# Patient Record
Sex: Male | Born: 1955
Health system: Southern US, Community
[De-identification: ages and names within clinical notes are randomized; demographics above are authoritative.]

## PROBLEM LIST (undated history)

## (undated) DIAGNOSIS — J929 Pleural plaque without asbestos: Secondary | ICD-10-CM

## (undated) DIAGNOSIS — E785 Hyperlipidemia, unspecified: Secondary | ICD-10-CM

## (undated) DIAGNOSIS — E119 Type 2 diabetes mellitus without complications: Secondary | ICD-10-CM

## (undated) DIAGNOSIS — I10 Essential (primary) hypertension: Secondary | ICD-10-CM

## (undated) DIAGNOSIS — H269 Unspecified cataract: Secondary | ICD-10-CM

## (undated) HISTORY — DX: Hyperlipidemia, unspecified: E78.5

## (undated) HISTORY — DX: Type 2 diabetes mellitus without complications: E11.9

## (undated) HISTORY — DX: Unspecified cataract: H26.9

---
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
## 2015-07-21 ENCOUNTER — Other Ambulatory Visit: Payer: Self-pay | Admitting: Infectious Disease

## 2015-07-21 ENCOUNTER — Ambulatory Visit
Admission: RE | Admit: 2015-07-21 | Discharge: 2015-07-21 | Disposition: A | Discharge status: Home / Self Care | Payer: No Typology Code available for payment source | Source: Ambulatory Visit | Attending: Infectious Disease | Admitting: Infectious Disease

## 2015-07-21 DIAGNOSIS — Z111 Encounter for screening for respiratory tuberculosis: Secondary | ICD-10-CM

## 2015-07-22 ENCOUNTER — Other Ambulatory Visit: Payer: Self-pay | Admitting: Infectious Disease

## 2015-07-22 ENCOUNTER — Ambulatory Visit
Admission: RE | Admit: 2015-07-22 | Discharge: 2015-07-22 | Disposition: A | Discharge status: Home / Self Care | Payer: No Typology Code available for payment source | Source: Ambulatory Visit | Attending: Infectious Disease | Admitting: Infectious Disease

## 2015-07-22 DIAGNOSIS — R9389 Abnormal findings on diagnostic imaging of other specified body structures: Secondary | ICD-10-CM

## 2015-08-06 ENCOUNTER — Ambulatory Visit (INDEPENDENT_AMBULATORY_CARE_PROVIDER_SITE_OTHER): Payer: Self-pay | Admitting: Family Medicine

## 2015-08-06 VITALS — BP 110/68 | HR 86 | Temp 98.5°F | Resp 16 | Ht 65.0 in | Wt 220.8 lb

## 2015-08-06 DIAGNOSIS — R9389 Abnormal findings on diagnostic imaging of other specified body structures: Secondary | ICD-10-CM

## 2015-08-06 DIAGNOSIS — R938 Abnormal findings on diagnostic imaging of other specified body structures: Secondary | ICD-10-CM

## 2015-08-06 NOTE — Progress Notes (Signed)
   Subjective:    Patient ID: Lance Mason, male    DOB: 22-Sep-1956, 59 y.o.   MRN: [id number]  Chief Complaint  Patient presents with  . SPOTS ON LUNG    was diagnosed in [place] (APRIL), had a CXR before coming to the [place] to live   Medications, allergies, past medical history, surgical history, family history, social history and problem list reviewed and updated.  HPI  15 yof presents with recent abnormal cxr.   Initially had a cxr in [place] for immigration purposes prior to coming to the [place]. Per pts family member who is interpreting this cxr showed nodules. He then tested neg for tb and came to the [place]. Once in this country he had a f/u cxr 2 wks ago at [facility] on [address]. These results are in epic and are concerning for prior asbestos exposure. Radiologist note recommends non contrast ct.   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 He denies cp, sob, fevers, chills, cough, hemoptysis. Quit smoking 20 yrs ago. Smoked for 5 yrs.   Review of Systems See HPI     Objective:   Physical Exam  Constitutional: He appears well-developed and well-nourished.  Non-toxic appearance. He does not have a sickly appearance. He does not appear ill. No distress.  BP 110/68 mmHg  Pulse 86  Temp(Src) 98.5 F (36.9 C) (Oral)  Resp 16  Ht  (1.651 m)  Wt 220 lb 12.8 oz (100.154 kg)  BMI 36.74 kg/m2  SpO2 98%       Assessment & Plan:   Abnormal chest x-ray - Plan: CT Chest Wo Contrast, Ambulatory referral to Pulmonology --non contrast chest ct ordered per radiology recommendation --referral placed for pulm  --uptodate information given regarding asbestos exposure  Greater than 20 minutes spent with family and pt discussing test results and plan moving forward.   Donnajean Lopes, PA-C Physician Assistant-Certified [facility]  08/06/2015 10:38 AM

## 2015-08-06 NOTE — Patient Instructions (Signed)
We have referred you for a CT scan of the chest. We will be in contact with you when this is scheduled. I have also referred you to see a lung specialist. This will happen after the CT scan.  Please let us know if there is anything we can do in the meantime.

## 2015-08-10 NOTE — Progress Notes (Signed)
Discussed with McVeigh PA and plan agreed upon  Sandria Bales. Alwyn Ren MD

## 2015-08-18 ENCOUNTER — Ambulatory Visit (HOSPITAL_COMMUNITY): Payer: No Typology Code available for payment source | Attending: Physician Assistant

## 2015-08-27 ENCOUNTER — Institutional Professional Consult (permissible substitution): Payer: No Typology Code available for payment source | Admitting: Internal Medicine

## 2015-09-25 ENCOUNTER — Institutional Professional Consult (permissible substitution): Payer: No Typology Code available for payment source | Admitting: Internal Medicine

## 2015-09-28 ENCOUNTER — Institutional Professional Consult (permissible substitution): Payer: 59 | Admitting: Pulmonary Disease

## 2015-09-30 ENCOUNTER — Encounter: Payer: Self-pay | Admitting: Internal Medicine

## 2015-09-30 ENCOUNTER — Ambulatory Visit (INDEPENDENT_AMBULATORY_CARE_PROVIDER_SITE_OTHER): Payer: 59 | Admitting: Internal Medicine

## 2015-09-30 VITALS — BP 106/70 | HR 100 | Ht 66.0 in | Wt 224.0 lb

## 2015-09-30 DIAGNOSIS — R911 Solitary pulmonary nodule: Secondary | ICD-10-CM | POA: Insufficient documentation

## 2015-09-30 DIAGNOSIS — I10 Essential (primary) hypertension: Secondary | ICD-10-CM

## 2015-09-30 NOTE — Patient Instructions (Signed)
Please see patient coordinator before you leave today  to schedule CT scan and we will call with results

## 2015-09-30 NOTE — Progress Notes (Signed)
Subjective:    Patient ID: Lance Mason, male    DOB: 07-Sep-1956,    MRN: [id number]  HPI  59 yo Lance Mason quit smoking around 1996 told he had abn cxr in June 2016 with neg TB test in [place] (with no priors apparently) so cleared to immigrate and referred to pulmonary clinic 09/30/2015  by Dr Agustin CreeMcVeigh with cxr showing L mid lung nodule with ? asbestos   09/30/2015 1st [facility] office visit/ Anant Agard   Chief Complaint  Patient presents with  . Pulmonary Consult    Referred by Dr Raelyn Ensignodd McVeigh for eval of pulmonary nodules. [name].  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 No unusual exp hx or h/o childhood pna/ asthma or knowledge of premature birth.  Sleeping ok without nocturnal  or early am exacerbation  of respiratory  c/o's or need for noct saba. Also denies any obvious fluctuation of symptoms with weather or environmental changes or other aggravating or alleviating factors except as outlined above   Current Medications, Allergies, Complete Past Medical History, Past Surgical History, Family History, and Social History were reviewed in [facility] electronic medical record.             Review of Systems  Constitutional: Negative for fever, chills, activity change, appetite change and unexpected weight change.  HENT: Negative for congestion, dental problem, postnasal drip, rhinorrhea, sneezing, sore throat, trouble swallowing and voice change.   Eyes: Negative for visual disturbance.  Respiratory: Positive for shortness of breath. Negative for cough and choking.   Cardiovascular: Negative for  chest pain and leg swelling.  Gastrointestinal: Negative for nausea, vomiting and abdominal pain.  Genitourinary: Negative for difficulty urinating.  Musculoskeletal: Negative for arthralgias.  Skin: Negative for rash.  Psychiatric/Behavioral: Negative for behavioral problems and confusion.       Objective:   Physical Exam  amb obese latino male nad  Wt Readings from Last 3 Encounters:  09/30/15 224 lb (101.606 kg)  08/06/15 220 lb 12.8 oz (100.154 kg)    Vital signs reviewed   HEENT: nl dentition, turbinates, and orophanx. Nl external ear canals without cough reflex   NECK :  without JVD/Nodes/TM/ nl carotid upstrokes bilaterally   LUNGS: no acc muscle use, clear to A and P bilaterally without cough on insp or exp maneuvers   CV:  RRR  no s3 or murmur or increase in P2, no edema   ABD:  soft and nontender with nl excursion in the supine position. No bruits or organomegaly, bowel sounds nl  MS:  warm without deformities, calf tenderness, cyanosis or clubbing  SKIN: warm and dry without lesions    NEURO:  alert, approp, no deficits     I personally reviewed images and agree with radiology impression as follows:  CXR:  07/22/15  Persistent ill-defined nodular opacity in left midlung. Differential diagnosis includes calcified pleural plaque, bronchogenic carcinoma, and infectious or inflammatory process. Chest CT without contrast recommended for further evaluation.  Calcified pleural plaque along both hemidiaphragms, consistent with prior asbestos exposure.  Assessment & Plan:

## 2015-10-01 ENCOUNTER — Encounter: Payer: Self-pay | Admitting: Internal Medicine

## 2015-10-01 DIAGNOSIS — I10 Essential (primary) hypertension: Secondary | ICD-10-CM | POA: Insufficient documentation

## 2015-10-01 NOTE — Assessment & Plan Note (Signed)
I have reviewed his available chest x-rays and agree with radiology that the changes are nonspecific but could represent asbestosis unfortunately the nodule therefore could represent bronchogenic carcinoma. The next best step therefore is to proceed with a CT chest scan and go from there.  I had an extended discussion with the patient and fm through interpreter reviewing all relevant studies completed to date and  lasting 25 minutes (most of which was focused on  trying to understand his occupational exp hx)  of a 40 minute visit    Discussed in detail all the  indications, usual  risks and alternatives  relative to the benefits with patient who agrees to proceed with conservative w/u as outline.      Please see instructions for details which were reviewed in writing and the patient given a copy highlighting the part that I personally wrote and discussed at today's ov.

## 2015-10-01 NOTE — Assessment & Plan Note (Signed)
Complicated by dm and hbp  Body mass index is 36.17 kg/(m^2).  No results found for: TSH   Contributing also likely to  doe/reviewed the need and the process to achieve and maintain neg calorie balance > defer f/u primary care including intermittently monitoring thyroid status

## 2015-10-01 NOTE — Assessment & Plan Note (Signed)
Note on ACEi but no cough so ok to continue

## 2015-10-05 ENCOUNTER — Ambulatory Visit (INDEPENDENT_AMBULATORY_CARE_PROVIDER_SITE_OTHER)
Admission: RE | Admit: 2015-10-05 | Discharge: 2015-10-05 | Disposition: A | Discharge status: Home / Self Care | Payer: 59 | Source: Ambulatory Visit | Attending: Internal Medicine | Admitting: Internal Medicine

## 2015-10-05 DIAGNOSIS — J929 Pleural plaque without asbestos: Secondary | ICD-10-CM

## 2015-10-05 DIAGNOSIS — R911 Solitary pulmonary nodule: Secondary | ICD-10-CM | POA: Diagnosis not present

## 2015-10-05 HISTORY — DX: Pleural plaque without asbestos: J92.9

## 2015-10-05 NOTE — Progress Notes (Signed)
Quick Note:  Spoke with pt and notified of results per Dr. Wert. Pt verbalized understanding and denied any questions.  ______ 

## 2016-05-28 ENCOUNTER — Encounter (HOSPITAL_COMMUNITY): Payer: Self-pay | Admitting: Emergency Medicine

## 2016-05-28 ENCOUNTER — Ambulatory Visit (HOSPITAL_COMMUNITY)
Admission: EM | Admit: 2016-05-28 | Discharge: 2016-05-28 | Disposition: A | Discharge status: Nursing Home (Includes ICF) | Payer: BLUE CROSS/BLUE SHIELD | Attending: Emergency Medicine | Admitting: Emergency Medicine

## 2016-05-28 DIAGNOSIS — R739 Hyperglycemia, unspecified: Secondary | ICD-10-CM | POA: Diagnosis not present

## 2016-05-28 DIAGNOSIS — H05821 Myopathy of extraocular muscles, right orbit: Secondary | ICD-10-CM

## 2016-05-28 DIAGNOSIS — I10 Essential (primary) hypertension: Secondary | ICD-10-CM | POA: Insufficient documentation

## 2016-05-28 DIAGNOSIS — Z7984 Long term (current) use of oral hypoglycemic drugs: Secondary | ICD-10-CM | POA: Diagnosis not present

## 2016-05-28 DIAGNOSIS — R29810 Facial weakness: Secondary | ICD-10-CM | POA: Diagnosis not present

## 2016-05-28 DIAGNOSIS — Z87891 Personal history of nicotine dependence: Secondary | ICD-10-CM | POA: Insufficient documentation

## 2016-05-28 DIAGNOSIS — E1165 Type 2 diabetes mellitus with hyperglycemia: Secondary | ICD-10-CM | POA: Diagnosis not present

## 2016-05-28 DIAGNOSIS — H02402 Unspecified ptosis of left eyelid: Secondary | ICD-10-CM | POA: Diagnosis present

## 2016-05-28 DIAGNOSIS — G510 Bell's palsy: Secondary | ICD-10-CM | POA: Diagnosis not present

## 2016-05-28 LAB — BASIC METABOLIC PANEL
ANION GAP: 10 (ref 5–15)
BUN: 15 mg/dL (ref 6–20)
CHLORIDE: 97 mmol/L — AB (ref 101–111)
CO2: 24 mmol/L (ref 22–32)
Calcium: 9.4 mg/dL (ref 8.9–10.3)
Creatinine, Ser: 0.76 mg/dL (ref 0.61–1.24)
GFR calc non Af Amer: >60 mL/min (ref >60)
Glucose, Bld: 474 mg/dL — ABNORMAL HIGH (ref 65–99)
POTASSIUM: 3.8 mmol/L (ref 3.5–5.1)
SODIUM: 131 mmol/L — AB (ref 135–145)

## 2016-05-28 LAB — POCT I-STAT, CHEM 8
BUN: 16 mg/dL (ref 6–20)
CREATININE: 0.7 mg/dL (ref 0.61–1.24)
Calcium, Ion: 1.1 mmol/L — ABNORMAL LOW (ref 1.13–1.30)
Chloride: 97 mmol/L — ABNORMAL LOW (ref 101–111)
Glucose, Bld: 491 mg/dL — ABNORMAL HIGH (ref 65–99)
HEMATOCRIT: 46 % (ref 39.0–52.0)
Hemoglobin: 15.6 g/dL (ref 13.0–17.0)
POTASSIUM: 4.1 mmol/L (ref 3.5–5.1)
Sodium: 134 mmol/L — ABNORMAL LOW (ref 135–145)
TCO2: 27 mmol/L (ref 0–100)

## 2016-05-28 LAB — CBC
HEMATOCRIT: 43.6 % (ref 39.0–52.0)
HEMOGLOBIN: 15.3 g/dL (ref 13.0–17.0)
MCH: 31.3 pg (ref 26.0–34.0)
MCHC: 35.1 g/dL (ref 30.0–36.0)
MCV: 89.2 fL (ref 78.0–100.0)
PLATELETS: 200 10*3/uL (ref 150–400)
RBC: 4.89 MIL/uL (ref 4.22–5.81)
RDW: 12.5 % (ref 11.5–15.5)
WBC: 7.7 10*3/uL (ref 4.0–10.5)

## 2016-05-28 LAB — URINALYSIS, ROUTINE W REFLEX MICROSCOPIC
Bilirubin Urine: NEGATIVE
Hgb urine dipstick: NEGATIVE
Ketones, ur: 15 mg/dL — AB
LEUKOCYTES UA: NEGATIVE
Nitrite: NEGATIVE
PH: 5 (ref 5.0–8.0)
PROTEIN: NEGATIVE mg/dL
SPECIFIC GRAVITY, URINE: 1.041 — AB (ref 1.005–1.030)

## 2016-05-28 LAB — CBG MONITORING, ED: Glucose-Capillary: 480 mg/dL — ABNORMAL HIGH (ref 65–99)

## 2016-05-28 LAB — URINE MICROSCOPIC-ADD ON
BACTERIA UA: NONE SEEN
RBC / HPF: NONE SEEN RBC/hpf (ref 0–5)

## 2016-05-28 NOTE — ED Provider Notes (Signed)
CSN: [id number]     Arrival date & time 05/28/16  1924 History   First MD Initiated Contact with Patient 05/28/16 1933     Chief Complaint  Patient presents with  . Facial Droop   (Consider location/radiation/quality/duration/timing/severity/associated sxs/prior Treatment) HPI He is a 60-year-old man here with his daughter and granddaughter for evaluation of facial twitch. Granddaughter acts as Equities trader.  Yesterday, the family noticed that his right eye seemed to be twitching and his smile maybe pulled a little to the right.  Initially, he reported some pain in the back of his neck, but this has resolved. He denies any focal numbness, tingling, or weakness. No difficulty with speech or swallowing. No visual changes. Another granddaughter was admitted to the emergency room yesterday in poor condition, which did cause a lot of stress.  Family wanted him to be checked out given his history of diabetes.  Past Medical History  Diagnosis Date  . Diabetes mellitus without complication (HCC)    History reviewed. No pertinent past surgical history. Family History  Problem Relation Age of Onset  . Heart disease Mother   . Hypertension Mother   . Heart disease Father    Social History  Substance Use Topics  . Smoking status: Former Smoker -- 0.50 packs/day for 5 years    Types: Cigarettes    Quit date: 12/19/1994  . Smokeless tobacco: Never Used  . Alcohol Use: 0.0 oz/week    0 Standard drinks or equivalent per week     Comment: occasionally beer    Review of Systems As in history of present illness Allergies  Review of patient's allergies indicates no known allergies.  Home Medications   Prior to Admission medications   Medication Sig Start Date End Date Taking? Authorizing Provider  glipiZIDE (GLUCOTROL) 5 MG tablet Take 1 tablet by mouth daily. 09/25/15   Historical Provider, MD  lisinopril-hydrochlorothiazide (PRINZIDE,ZESTORETIC) 10-12.5 MG tablet Take 1 tablet by mouth daily.  09/25/15   Historical Provider, MD  metFORMIN (GLUCOPHAGE) 850 MG tablet Take 850 mg by mouth 2 (two) times daily with a meal.    Historical Provider, MD   Meds Ordered and Administered this Visit  Medications - No data to display  BP 122/69 mmHg  Pulse 85  Temp(Src) 97.9 F (36.6 C) (Oral)  Resp 16  SpO2 100% No data found.   Physical Exam  Constitutional: He is oriented to person, place, and time. He appears well-developed and well-nourished. No distress.  Eyes: Conjunctivae and EOM are normal. Pupils are equal, round, and reactive to light.  Neck: Neck supple.  Cardiovascular: Normal rate, regular rhythm and normal heart sounds.   No murmur heard. Pulmonary/Chest: Effort normal and breath sounds normal. No respiratory distress. He has no wheezes. He has no rales.  Neurological: He is alert and oriented to person, place, and time. No cranial nerve deficit. He exhibits normal muscle tone. Coordination normal. GCS eye subscore is 4. GCS verbal subscore is 5. GCS motor subscore is 6.  Negative Romberg. No facial asymmetry, except for slight drooping of the right eye. I am also able to open the right eye against resistance very slightly. Smile is symmetric. Symmetric eyebrow raise. Normal extraocular movements.    ED Course  Procedures (including critical care time)  Labs Review Labs Reviewed  POCT I-STAT, CHEM 8 - Abnormal; Notable for the following:    Sodium 134 (*)    Chloride 97 (*)    Glucose, Bld 491 (*)    Calcium,  Ion 1.10 (*)    All other components within normal limits    Imaging Review No results found.   MDM   1. Hyperglycemia   2. Eye muscle weakness, right    Neurologic exam is unremarkable except for slight drooping of the right eye with slight weakness of the muscles of the right eye.  I suspect all of his symptoms are due to the stress of having his granddaughter in the emergency room. However, with a glucose of 491 and no values to compare this to, I  am concerned for underlying issues. He has not seen his primary doctor for over a year. He has been taking his diabetes medication. He does not check his blood sugar at home.  We'll transfer to the emergency room for additional evaluation and management of hyperglycemia.    Charm RingsErin J Marciana Uplinger, MD 05/28/16 2020

## 2016-05-28 NOTE — ED Notes (Signed)
Pt c/o facial droop on left side onset yest affecting his eye and mouth... Pt MAE x4... Steady gait... Both daughters are at bedside... A&O x4... No acute distress.

## 2016-05-28 NOTE — ED Notes (Signed)
Pt sent from UC, pt initially c/o eye twitching. Pt had I stat chem 8 drawn and showed blood sugar nearly 500. Pt denies any sypmetomes but when questioned, states he has been feeling the need to pee a lot more and been thirsty. Pt in NAD, denies pain, AAOX4.

## 2016-05-28 NOTE — ED Notes (Signed)
Pt declined shuttle service... tx by PV w/family

## 2016-05-29 ENCOUNTER — Emergency Department (HOSPITAL_COMMUNITY)
Admission: EM | Admit: 2016-05-29 | Discharge: 2016-05-29 | Disposition: A | Discharge status: Home / Self Care | Payer: BLUE CROSS/BLUE SHIELD | Attending: Emergency Medicine | Admitting: Emergency Medicine

## 2016-05-29 ENCOUNTER — Emergency Department (HOSPITAL_COMMUNITY): Payer: BLUE CROSS/BLUE SHIELD

## 2016-05-29 DIAGNOSIS — R29810 Facial weakness: Secondary | ICD-10-CM | POA: Diagnosis not present

## 2016-05-29 DIAGNOSIS — R739 Hyperglycemia, unspecified: Secondary | ICD-10-CM

## 2016-05-29 DIAGNOSIS — E119 Type 2 diabetes mellitus without complications: Secondary | ICD-10-CM

## 2016-05-29 HISTORY — DX: Essential (primary) hypertension: I10

## 2016-05-29 LAB — RAPID URINE DRUG SCREEN, HOSP PERFORMED
Amphetamines: NOT DETECTED
BARBITURATES: NOT DETECTED
Benzodiazepines: NOT DETECTED
Cocaine: NOT DETECTED
OPIATES: NOT DETECTED
TETRAHYDROCANNABINOL: NOT DETECTED

## 2016-05-29 LAB — CBG MONITORING, ED
Glucose-Capillary: 261 mg/dL — ABNORMAL HIGH (ref 65–99)
Glucose-Capillary: 353 mg/dL — ABNORMAL HIGH (ref 65–99)

## 2016-05-29 MED ORDER — SODIUM CHLORIDE 0.9 % IV BOLUS (SEPSIS)
2000.0000 mL | Freq: Once | INTRAVENOUS | Status: AC
  Administered 2016-05-29: 2000 mL via INTRAVENOUS

## 2016-05-29 NOTE — ED Provider Notes (Signed)
CSN: [id number]     Arrival date & time 05/28/16  2038 History   First MD Initiated Contact with Patient 05/29/16 0042     Chief Complaint  Patient presents with  . Hyperglycemia     (Consider location/radiation/quality/duration/timing/severity/associated sxs/prior Treatment) The history is provided by the patient, medical records and a relative. The history is limited by a language barrier. A language interpreter was used.     Lance Mason is a 60 y.o. male  with a hx of NIDDM presents to the Emergency Department complaining of persistent, left eye drooping onset yesterday afternoon noted by family.  Pt denies symptoms.  Pt was evaluated at [facility] tonight who thought this was stress related as his granddaughter is in the ICU at this time.  Upon drawing blood work, it was discovered that he was hyperglycemic.  Pt does endorse increased urination and thirst for some time.  Denies numbness, weakness, tingling, AMS, imbalance, speech or swallowing deficits, fatigue, visual changes, headache, nausea, vomiting, syncope.  He denies recent illness.  No known injuries.  He takes metformin and has been compliant.  Pt does reports that he ran out of his HTN medications 4 days ago.  Last PCP visit was > 1 year ago.  PT does not check his glucose at home.  No known aggravating or alleviating factors.      Past Medical History  Diagnosis Date  . Diabetes mellitus without complication (HCC)   . Hypertension    History reviewed. No pertinent past surgical history. Family History  Problem Relation Age of Onset  . Heart disease Mother   . Hypertension Mother   . Heart disease Father    Social History  Substance Use Topics  . Smoking status: Former Smoker -- 0.50 packs/day for 5 years    Types: Cigarettes    Quit date: 12/19/1994  . Smokeless tobacco: Never Used  . Alcohol Use: 0.0 oz/week    0 Standard drinks or equivalent per week     Comment: occasionally beer    Review of Systems   Constitutional: Negative for fever, diaphoresis, appetite change, fatigue and unexpected weight change.  HENT: Negative for mouth sores.   Eyes: Negative for visual disturbance.  Respiratory: Negative for cough, chest tightness, shortness of breath and wheezing.   Cardiovascular: Negative for chest pain.  Gastrointestinal: Negative for nausea, vomiting, abdominal pain, diarrhea and constipation.  Endocrine: Negative for polydipsia, polyphagia and polyuria.  Genitourinary: Negative for dysuria, urgency, frequency and hematuria.  Musculoskeletal: Negative for back pain and neck stiffness.  Skin: Negative for rash.  Allergic/Immunologic: Negative for immunocompromised state.  Neurological: Negative for syncope, light-headedness and headaches.       Left eye droop per family - pt denies  Hematological: Does not bruise/bleed easily.  Psychiatric/Behavioral: Negative for sleep disturbance. The patient is not nervous/anxious.       Allergies  Review of patient's allergies indicates no known allergies.  Home Medications   Prior to Admission medications   Medication Sig Start Date End Date Taking? Authorizing Provider  glipiZIDE (GLUCOTROL) 5 MG tablet Take 1 tablet by mouth daily. 09/25/15   Historical Provider, MD  lisinopril-hydrochlorothiazide (PRINZIDE,ZESTORETIC) 10-12.5 MG tablet Take 1 tablet by mouth daily. 09/25/15   Historical Provider, MD  metFORMIN (GLUCOPHAGE) 850 MG tablet Take 850 mg by mouth 2 (two) times daily with a meal.    Historical Provider, MD   BP 132/79 mmHg  Pulse 69  Temp(Src) 98.5 F (36.9 C) (Oral)  Resp 15  Ht  (1.676 m)  Wt 92.171 kg  BMI 32.81 kg/m2  SpO2 99% Physical Exam  Constitutional: He is oriented to person, place, and time. He appears well-developed and well-nourished. No distress.  HENT:  Head: Normocephalic and atraumatic.  Mouth/Throat: Oropharynx is clear and moist.  Eyes: Conjunctivae and EOM are normal. Pupils are equal, round,  and reactive to light. No scleral icterus.  No horizontal, vertical or rotational nystagmus Very minimal drooping of the right eye - family reports his right eye is normal and the Left eye is the one in questions - no drooping of the left eye noted  Neck: Normal range of motion. Neck supple.  Full active and passive ROM without pain No midline or paraspinal tenderness No nuchal rigidity or meningeal signs  Cardiovascular: Normal rate, regular rhythm, normal heart sounds and intact distal pulses.   No murmur heard. Pulmonary/Chest: Effort normal and breath sounds normal. No respiratory distress. He has no wheezes. He has no rales.  Abdominal: Soft. Bowel sounds are normal. There is no tenderness. There is no rebound and no guarding.  Musculoskeletal: Normal range of motion.  Lymphadenopathy:    He has no cervical adenopathy.  Neurological: He is alert and oriented to person, place, and time. He has normal reflexes. No cranial nerve deficit. He exhibits normal muscle tone. Coordination normal.  Mental Status:  Alert, oriented, thought content appropriate. Speech fluent without evidence of aphasia. Able to follow 2 step commands without difficulty.  Cranial Nerves:  II:  Peripheral visual fields grossly normal, pupils equal, round, reactive to light III,IV, VI: ptosis not present, extra-ocular motions intact bilaterally  V,VII: smile symmetric, facial light touch sensation equal VIII: hearing grossly normal bilaterally  IX,X: midline uvula rise  XI: bilateral shoulder shrug equal and strong XII: midline tongue extension  Motor:  5/5 in upper and lower extremities bilaterally including strong and equal grip strength and dorsiflexion/plantar flexion Sensory: Pinprick and light touch normal in all extremities.  Deep Tendon Reflexes: 2+ and symmetric  Cerebellar: normal finger-to-nose with bilateral upper extremities Gait: normal gait and balance CV: distal pulses palpable throughout   Skin:  Skin is warm and dry. No rash noted. He is not diaphoretic.  Psychiatric: He has a normal mood and affect. His behavior is normal. Judgment and thought content normal.  Nursing note and vitals reviewed.   ED Course  Procedures (including critical care time) Labs Review Labs Reviewed  BASIC METABOLIC PANEL - Abnormal; Notable for the following:    Sodium 131 (*)    Chloride 97 (*)    Glucose, Bld 474 (*)    All other components within normal limits  URINALYSIS, ROUTINE W REFLEX MICROSCOPIC (NOT AT [facility]) - Abnormal; Notable for the following:    Specific Gravity, Urine 1.041 (*)    Glucose, UA >1000 (*)    Ketones, ur 15 (*)    All other components within normal limits  URINE MICROSCOPIC-ADD ON - Abnormal; Notable for the following:    Squamous Epithelial / LPF 0-5 (*)    All other components within normal limits  CBG MONITORING, ED - Abnormal; Notable for the following:    Glucose-Capillary 480 (*)    All other components within normal limits  CBG MONITORING, ED - Abnormal; Notable for the following:    Glucose-Capillary 353 (*)    All other components within normal limits  CBG MONITORING, ED - Abnormal; Notable for the following:    Glucose-Capillary 261 (*)    All  other components within normal limits  CBC  URINE RAPID DRUG SCREEN, HOSP PERFORMED    Imaging Review Ct Head Wo Contrast  05/29/2016  CLINICAL DATA:  Acute onset of left-sided facial droop. Initial encounter. EXAM: CT HEAD WITHOUT CONTRAST TECHNIQUE: Contiguous axial images were obtained from the base of the skull through the vertex without intravenous contrast. COMPARISON:  None. FINDINGS: There is no evidence of acute infarction, mass lesion, or intra- or extra-axial hemorrhage on CT. The posterior fossa, including the cerebellum, brainstem and fourth ventricle, is within normal limits. The third and lateral ventricles, and basal ganglia are unremarkable in appearance. The cerebral hemispheres are symmetric in  appearance, with normal gray-white differentiation. No mass effect or midline shift is seen. There is no evidence of fracture; visualized osseous structures are unremarkable in appearance. The orbits are within normal limits. The paranasal sinuses and mastoid air cells are well-aerated. No significant soft tissue abnormalities are seen. IMPRESSION: Unremarkable noncontrast CT of the head. Electronically Signed   By: Roanna RaiderJeffery  Chang M.D.   On: 05/29/2016 03:33   I have personally reviewed and evaluated these images and lab results as part of my medical decision-making.   EKG Interpretation   Date/Time:  Sunday May 29 2016 01:58:36 EDT Ventricular Rate:  73 PR Interval:  156 QRS Duration: 97 QT Interval:  405 QTC Calculation: 446 R Axis:   56 Text Interpretation:  Sinus rhythm Borderline low voltage, extremity leads  No old tracing to compare Confirmed by [facility]  MD, DAVID ([id number]) on  05/29/2016 2:13:10 AM      MDM   Final diagnoses:  Type 2 diabetes mellitus without complication, without long-term current use of insulin (HCC)  Hyperglycemia   [facility] sided eye droop onset > 24 hours prior, however this is not visible on my exam.  Patient granddaughter at bedside reports that she does not see any of this either.  Normal neurologic exam here. No evidence of CVA on CT scan. Doubt TIA as patient denied all symptoms including facial droop. Hyperglycemia has been treated with fluids alone and is under 300. He does not take insulin at home. He reports that he has been under significant stress with a family member in the ICU.  Recommend compliance with medications and diet along with close follow-up with primary care. This was discussed with patient and family who state understanding and are in agreement with the plan. Discussed with him reasons to return immediately to the emergency department including recurrence of the facial droop, slurred speech,  difficulty walking or other concerns.  The patient was discussed with and seen by Dr. Preston FleetingGlick who agrees with the treatment plan.    Dahlia ClientHannah Horacio Werth, PA-C 05/29/16 [phone]  Dione Boozeavid Glick, MD 05/29/16 [phone]

## 2016-05-29 NOTE — ED Notes (Signed)
Pt taken to CT.

## 2016-05-29 NOTE — Discharge Instructions (Signed)
1. Medications: usual home medications as directed 2. Treatment: rest, drink plenty of fluids, continue diabetic diet 3. Follow Up: Please followup with your primary doctor in 2-3 days for discussion of your diagnoses and further evaluation after today's visit; if you do not have a primary care doctor use the resource guide provided to find one; Please return to the ER for worsening or return of symptoms

## 2016-05-30 ENCOUNTER — Ambulatory Visit (INDEPENDENT_AMBULATORY_CARE_PROVIDER_SITE_OTHER): Payer: BLUE CROSS/BLUE SHIELD | Admitting: Family Medicine

## 2016-05-30 ENCOUNTER — Encounter (HOSPITAL_COMMUNITY): Payer: Self-pay | Admitting: Emergency Medicine

## 2016-05-30 VITALS — BP 114/72 | HR 101 | Temp 98.0°F | Resp 18 | Ht 66.0 in | Wt 202.0 lb

## 2016-05-30 DIAGNOSIS — E1165 Type 2 diabetes mellitus with hyperglycemia: Secondary | ICD-10-CM

## 2016-05-30 DIAGNOSIS — G510 Bell's palsy: Secondary | ICD-10-CM | POA: Insufficient documentation

## 2016-05-30 DIAGNOSIS — Z87891 Personal history of nicotine dependence: Secondary | ICD-10-CM

## 2016-05-30 DIAGNOSIS — R739 Hyperglycemia, unspecified: Secondary | ICD-10-CM | POA: Diagnosis not present

## 2016-05-30 DIAGNOSIS — I10 Essential (primary) hypertension: Secondary | ICD-10-CM

## 2016-05-30 DIAGNOSIS — R29810 Facial weakness: Secondary | ICD-10-CM

## 2016-05-30 DIAGNOSIS — R531 Weakness: Secondary | ICD-10-CM | POA: Diagnosis not present

## 2016-05-30 LAB — PROTIME-INR
INR: 1.01 (ref 0.00–1.49)
PROTHROMBIN TIME: 13.5 s (ref 11.6–15.2)

## 2016-05-30 LAB — COMPREHENSIVE METABOLIC PANEL
ALT: 21 U/L (ref 17–63)
ANION GAP: 8 (ref 5–15)
AST: 19 U/L (ref 15–41)
Albumin: 3.4 g/dL — ABNORMAL LOW (ref 3.5–5.0)
Alkaline Phosphatase: 109 U/L (ref 38–126)
BILIRUBIN TOTAL: 0.4 mg/dL (ref 0.3–1.2)
BUN: 11 mg/dL (ref 6–20)
CO2: 21 mmol/L — ABNORMAL LOW (ref 22–32)
Calcium: 8.7 mg/dL — ABNORMAL LOW (ref 8.9–10.3)
Chloride: 104 mmol/L (ref 101–111)
Creatinine, Ser: 0.66 mg/dL (ref 0.61–1.24)
Glucose, Bld: 400 mg/dL — ABNORMAL HIGH (ref 65–99)
POTASSIUM: 3.7 mmol/L (ref 3.5–5.1)
Sodium: 133 mmol/L — ABNORMAL LOW (ref 135–145)
TOTAL PROTEIN: 6.6 g/dL (ref 6.5–8.1)

## 2016-05-30 LAB — CBC WITH DIFFERENTIAL/PLATELET
Basophils Absolute: 0 10*3/uL (ref 0.0–0.1)
Basophils Relative: 0 %
EOS PCT: 3 %
Eosinophils Absolute: 0.2 10*3/uL (ref 0.0–0.7)
HEMATOCRIT: 43.6 % (ref 39.0–52.0)
Hemoglobin: 15.5 g/dL (ref 13.0–17.0)
Lymphocytes Relative: 35 %
Lymphs Abs: 2.4 10*3/uL (ref 0.7–4.0)
MCH: 31.6 pg (ref 26.0–34.0)
MCHC: 35.6 g/dL (ref 30.0–36.0)
MCV: 88.8 fL (ref 78.0–100.0)
MONO ABS: 0.5 10*3/uL (ref 0.1–1.0)
MONOS PCT: 8 %
NEUTROS ABS: 3.7 10*3/uL (ref 1.7–7.7)
NEUTROS PCT: 54 %
Platelets: 191 10*3/uL (ref 150–400)
RBC: 4.91 MIL/uL (ref 4.22–5.81)
RDW: 12.6 % (ref 11.5–15.5)
WBC: 6.9 10*3/uL (ref 4.0–10.5)

## 2016-05-30 LAB — CBG MONITORING, ED: GLUCOSE-CAPILLARY: 373 mg/dL — AB (ref 65–99)

## 2016-05-30 LAB — GLUCOSE, POCT (MANUAL RESULT ENTRY): POC GLUCOSE: 397 mg/dL — AB (ref 70–99)

## 2016-05-30 LAB — APTT: APTT: 27 s (ref 24–37)

## 2016-05-30 NOTE — Progress Notes (Addendum)
Subjective:  By signing my name below, I, [name], attest that this documentation has been prepared under the direction and in the presence of [name], MD.  Electronically Signed: [name], ED Scribe. 05/30/2016. 3:39 PM.   Patient ID: Lance Mason, male    DOB: 1956/07/04, 60 y.o.   MRN: [id number]  HPI Chief Complaint  Patient presents with  . Facial Droop    on left side since Saturday, seen at [facility] at [facility], blood sugar was over 500, was sent to ED CT scan showed no stroke  . Numbness    on right side of face, when trying to drink water it rolls out of mouth   . Diabetic    just came to the [place]  about 3 weeks ago from [place]     HPI Comments: Lance Mason is a 60 y.o. male who [facility] the [facility] complaining of facial droop. He was seen yesterday at [facility] ED after initial evaluation 2 nights ago at [facility] urgent care. At that visit had right sided face twitching, possible weakness. Did note some drooping of right eye. Glucose was 491. Noted to be nonadherent to diabetes at that time. Glucose in ER was 474. He had CT headed which was unremarkable. Facial droop was not seen in the ED. Hyperglycemia was treated with fluids and was discharge at a level of 261.   I was pulled acutely to see pt due to new right sided symptoms.   Today, pt complains of difficulty chewing and smiling on right side of face as well as leakage of water from right side of mouth since yesterday at 10 am. He reports associated HA, bilateral upper arm pain and weakness to entire body. Pt states he has been complaint with diabetes medication. He denies CP, SOB and palpitation.    Patient Active Problem List   Diagnosis Date Noted  . Morbid obesity (HCC) 10/01/2015  . Essential hypertension 10/01/2015  . Lung nodule 09/30/2015   Past Medical History  Diagnosis Date  . Diabetes mellitus without complication (HCC)   . Hypertension    No past surgical  history on file. No Known Allergies Prior to Admission medications   Medication Sig Start Date End Date Taking? Authorizing Provider  metFORMIN (GLUCOPHAGE) 850 MG tablet Take 850 mg by mouth 2 (two) times daily with a meal.   Yes Historical Provider, MD   Social History   Social History  . Marital Status: Married    Spouse Name: N/A  . Number of Children: N/A  . Years of Education: N/A   Occupational History  . Not on file.   Social History Main Topics  . Smoking status: Former Smoker -- 0.50 packs/day for 5 years    Types: Cigarettes    Quit date: 12/19/1994  . Smokeless tobacco: Never Used  . Alcohol Use: 0.0 oz/week    0 Standard drinks or equivalent per week     Comment: occasionally beer  . Drug Use: No  . Sexual Activity: Not on file   Other Topics Concern  . Not on file   Social History Narrative   Review of Systems  HENT: Positive for drooling.   Respiratory: Negative for shortness of breath.   Cardiovascular: Negative for chest pain and palpitations.  Musculoskeletal: Positive for myalgias.  Neurological: Positive for facial asymmetry, weakness and headaches. Negative for speech difficulty.    Objective:   Physical Exam  Constitutional: He is oriented  to person, place, and time. He appears well-developed and well-nourished.  HENT:  Head: Normocephalic and atraumatic.  Eyes: EOM are normal. Pupils are equal, round, and reactive to light. Right eye exhibits no nystagmus. Left eye exhibits no nystagmus.  Right eye appears mydriatic compared to left but are both reactive to light.  Neck: No JVD present. Carotid bruit is not present.  Cardiovascular: Normal rate, regular rhythm and normal heart sounds.   No murmur heard. Pulmonary/Chest: Effort normal and breath sounds normal. He has no rales.  Musculoskeletal: He exhibits no edema.  Neurological: He is alert and oriented to person, place, and time. He displays a negative Romberg sign.  UE and LE strength  equal. Right sided droop, unable to smile. Decreased closure on left versus right eye.  He is able to close right eye but less in left. He has leakage of air on right when puffing cheeks.  Nor pronator drift. Normal finger to nose.   Skin: Skin is warm and dry.  Psychiatric: He has a normal mood and affect.  Vitals reviewed.  Filed Vitals:   05/30/16 1533  BP: 114/72  Pulse: 101  Temp: 98 F (36.7 C)  TempSrc: Oral  Resp: 18  Height: 5\' 6"  (1.676 m)  Weight: 202 lb (91.627 kg)  SpO2: 97%   Results for orders placed or performed in visit on 05/30/16  POCT glucose (manual entry)  Result Value Ref Range   POC Glucose 397 (A) 70 - 99 mg/dl      Assessment & Plan:  Lance Mason is a 60 y.o. male Facial droop  General weakness  Hyperglycemia - Plan: POCT glucose (manual entry)   History of uncontrolled diabetes, hypertension. Evaluation in emergency room after Urgent [facility] yesterday with left-sided facial droop by his and family member's history, but r sided symptoms by Urgent care note. Droop was not seen upon evaluation in the emergency room. Now complains of new  right-sided facial weakness, droop, difficulty swallowing liquids or eating due to right facial weakness since approximately 9 or 10 yesterday morning. There is been no progression of the symptoms, no focal weakness of his upper or lower extremities. Minimal headache, no chest pain or palpitations. R sided droop/weakness is seen on exam, but no other focal weakness.   - Based on timing of symptoms, EMS was not called emergently, but discussed need for emergency room evaluation, EMS transport was declined. He has a family member who will be driving him immediately after leaving our office for evaluation of possible CVA versus Bell's palsy.  Translator present, understanding expressed.  -Still hyperglycemic, uncontrolled diabetes. Denies any missed doses of medications recently, so will likely need to have his meds  adjusted either during hospitalization or on ER evaluation with outpatient follow-up if he is discharged today.   Triage nurse advised at [facility].   No orders of the defined types were placed in this encounter.   Patient Instructions       IF you received an x-ray today, you will receive an invoice from [facility]. Please contact [facility] at [phone] with questions or concerns regarding your invoice.   IF you received labwork today, you will receive an invoice from [employer] [facility]. Please contact [facility] at [phone] with questions or concerns regarding your invoice.   Our billing staff will not be able to assist you with questions regarding bills from these companies.  You will be contacted with the lab results as soon as they are available.  The fastest way to get your results is to activate your My Chart account. Instructions are located on the last page of this paperwork. If you have not heard from us regarding the results in 2 weeks, please contact this office.     Va a cuarto de emergencia de [place] ahora por su simptomas nuevas en cara.       I personally performed the services described in this documentation, which was scribed in my presence. The recorded information has been reviewed and considered, and addended by me as needed.   Signed,   Meredith StaggersJeffrey Fatiha Guzy, MD [facility].  05/30/2016 4:02 PM

## 2016-05-30 NOTE — Patient Instructions (Addendum)
     IF you received an x-ray today, you will receive an invoice from [facility]. Please contact [facility] at [phone] with questions or concerns regarding your invoice.   IF you received labwork today, you will receive an invoice from [facility]. Please contact [facility] at [phone] with questions or concerns regarding your invoice.   Our billing staff will not be able to assist you with questions regarding bills from these companies.  You will be contacted with the lab results as soon as they are available. The fastest way to get your results is to activate your My Chart account. Instructions are located on the last page of this paperwork. If you have not heard from us regarding the results in 2 weeks, please contact this office.     Va a cuarto de emergencia de [name] ahora por su simptomas nuevas en cara.

## 2016-05-30 NOTE — ED Notes (Signed)
Pt to ED with c/o right side facial numbness.  Pt was seen here Sat. for same and had neg CT.  Pt's granddaughter st's now when pt tries to eat or drink food and liquid come out right side of face onset yesterday am.  Pt also c/o headache on right side of head.  No other weakness noted at this time.

## 2016-05-30 NOTE — ED Notes (Signed)
Spoke with Dr. Madilyn Hookees about pt's triage assessment., will hold off on CT at this time until further eval from EDP

## 2016-05-31 ENCOUNTER — Emergency Department (HOSPITAL_COMMUNITY)
Admission: EM | Admit: 2016-05-31 | Discharge: 2016-05-31 | Disposition: A | Discharge status: Home / Self Care | Payer: BLUE CROSS/BLUE SHIELD | Source: Home / Self Care | Attending: Emergency Medicine | Admitting: Emergency Medicine

## 2016-05-31 DIAGNOSIS — R739 Hyperglycemia, unspecified: Secondary | ICD-10-CM

## 2016-05-31 DIAGNOSIS — G510 Bell's palsy: Secondary | ICD-10-CM

## 2016-05-31 LAB — CBG MONITORING, ED
GLUCOSE-CAPILLARY: 166 mg/dL — AB (ref 65–99)
GLUCOSE-CAPILLARY: 390 mg/dL — AB (ref 65–99)
Glucose-Capillary: 194 mg/dL — ABNORMAL HIGH (ref 65–99)

## 2016-05-31 MED ORDER — VALACYCLOVIR HCL 500 MG PO TABS: 500.0000 mg | ORAL_TABLET | Freq: Two times a day (BID) | ORAL | Status: DC

## 2016-05-31 MED ORDER — GLIPIZIDE 5 MG PO TABS: 5.0000 mg | ORAL_TABLET | Freq: Every day | ORAL | Status: DC

## 2016-05-31 MED ORDER — SODIUM CHLORIDE 0.9 % IV BOLUS (SEPSIS)
1000.0000 mL | Freq: Once | INTRAVENOUS | Status: AC
  Administered 2016-05-31: 1000 mL via INTRAVENOUS

## 2016-05-31 MED ORDER — BLOOD GLUCOSE METER KIT: PACK | Status: AC

## 2016-05-31 MED ORDER — INSULIN ASPART 100 UNIT/ML ~~LOC~~ SOLN
15.0000 [IU] | Freq: Once | SUBCUTANEOUS | Status: AC
  Administered 2016-05-31: 15 [IU] via SUBCUTANEOUS
  Filled 2016-05-31: qty 1

## 2016-05-31 NOTE — ED Notes (Signed)
Informed Pollina MD about pt S/S

## 2016-05-31 NOTE — ED Notes (Signed)
CBG is 390.

## 2016-05-31 NOTE — ED Notes (Signed)
CBG is 166.

## 2016-05-31 NOTE — ED Provider Notes (Signed)
CSN: [id number]     Arrival date & time 05/30/16  1630 History  By signing my name below, I, Rosario Adie, attest that this documentation has been prepared under the direction and in the presence of Gilda Crease, MD.  Electronically Signed: Rosario Adie, ED Scribe. 05/31/2016. 1:10 AM.    Chief Complaint  Patient presents with  . Numbness    The history is provided by the patient and a relative. The history is limited by a language barrier. A language interpreter was used ([place]).   HPI Comments: Lance Mason is a 60 y.o. male with a PMHx significant for HTN and DM, who presents to the Emergency Department complaining of unchanged, constant R-sided facial numbness onset 2 days ago. He has had associated eye watering and R-sided HA. His daughter notes that the pt noticed the weakness upon trying to eat or drink fluids, and could not keep everything in his mouth. No other weakness noted at this time. No OTC medications or home remedies tried PTA. No alleviating factors.   PCP- None, pt routinely travels between [place] and the [place] and he is not followed by anyone specifically for any of his PMHx.   Past Medical History  Diagnosis Date  . Diabetes mellitus without complication (HCC)   . Hypertension    History reviewed. No pertinent past surgical history. Family History  Problem Relation Age of Onset  . Heart disease Mother   . Hypertension Mother   . Heart disease Father    Social History  Substance Use Topics  . Smoking status: Former Smoker -- 0.50 packs/day for 5 years    Types: Cigarettes    Quit date: 12/19/1994  . Smokeless tobacco: Never Used  . Alcohol Use: 0.0 oz/week    0 Standard drinks or equivalent per week     Comment: occasionally beer    Review of Systems  Eyes: Positive for discharge.  Neurological: Positive for facial asymmetry, weakness and headaches.  All other systems reviewed and are negative.  Allergies  Review of  patient's allergies indicates no known allergies.  Home Medications   Prior to Admission medications   Medication Sig Start Date End Date Taking? Authorizing Provider  metFORMIN (GLUCOPHAGE) 850 MG tablet Take 850 mg by mouth 2 (two) times daily with a meal.   Yes Historical Provider, MD   BP 115/74 mmHg  Pulse 84  Temp(Src) 98.3 F (36.8 C) (Oral)  Resp 19  Ht 5\' 6"  (1.676 m)  Wt 203 lb (92.08 kg)  BMI 32.78 kg/m2  SpO2 98%   Physical Exam  Constitutional: He is oriented to person, place, and time. He appears well-developed and well-nourished. No distress.  HENT:  Head: Normocephalic and atraumatic.  Right Ear: Hearing normal.  Left Ear: Hearing normal.  Nose: Nose normal.  Mouth/Throat: Oropharynx is clear and moist and mucous membranes are normal.  Eyes: Conjunctivae and EOM are normal. Pupils are equal, round, and reactive to light.  Neck: Normal range of motion. Neck supple.  Cardiovascular: Normal rate, regular rhythm, S1 normal, S2 normal and normal heart sounds.  Exam reveals no gallop and no friction rub.   No murmur heard. Pulmonary/Chest: Effort normal and breath sounds normal. No respiratory distress. He exhibits no tenderness.  Abdominal: Soft. Normal appearance and bowel sounds are normal. There is no hepatosplenomegaly. There is no tenderness. There is no rebound, no guarding, no tenderness at McBurney's point and negative Murphy's sign. No hernia.  Musculoskeletal: Normal range of  motion.  Neurological: He is alert and oriented to person, place, and time. He has normal strength. No cranial nerve deficit or sensory deficit. Coordination normal. GCS eye subscore is 4. GCS verbal subscore is 5. GCS motor subscore is 6.  R facial droop w/ inability to close R eye.  Skin: Skin is warm, dry and intact. No rash noted. No cyanosis.  Psychiatric: He has a normal mood and affect. His speech is normal and behavior is normal. Thought content normal.  Nursing note and vitals  reviewed.   ED Course  Procedures (including critical care time)  DIAGNOSTIC STUDIES: Oxygen Saturation is 98% on RA, normal by my interpretation.   COORDINATION OF CARE: 1:09 AM-Discussed next steps with pt including CT Head w/o contrast and IV fluids. Pt verbalized understanding and is agreeable with the plan.   Labs Review Labs Reviewed  COMPREHENSIVE METABOLIC PANEL - Abnormal; Notable for the following:    Sodium 133 (*)    CO2 21 (*)    Glucose, Bld 400 (*)    Calcium 8.7 (*)    Albumin 3.4 (*)    All other components within normal limits  CBG MONITORING, ED - Abnormal; Notable for the following:    Glucose-Capillary 373 (*)    All other components within normal limits  CBG MONITORING, ED - Abnormal; Notable for the following:    Glucose-Capillary 390 (*)    All other components within normal limits  CBG MONITORING, ED - Abnormal; Notable for the following:    Glucose-Capillary 194 (*)    All other components within normal limits  CBC WITH DIFFERENTIAL/PLATELET  APTT  PROTIME-INR    Imaging Review Ct Head Wo Contrast  05/29/2016  CLINICAL DATA:  Acute onset of left-sided facial droop. Initial encounter. EXAM: CT HEAD WITHOUT CONTRAST TECHNIQUE: Contiguous axial images were obtained from the base of the skull through the vertex without intravenous contrast. COMPARISON:  None. FINDINGS: There is no evidence of acute infarction, mass lesion, or intra- or extra-axial hemorrhage on CT. The posterior fossa, including the cerebellum, brainstem and fourth ventricle, is within normal limits. The third and lateral ventricles, and basal ganglia are unremarkable in appearance. The cerebral hemispheres are symmetric in appearance, with normal gray-white differentiation. No mass effect or midline shift is seen. There is no evidence of fracture; visualized osseous structures are unremarkable in appearance. The orbits are within normal limits. The paranasal sinuses and mastoid air cells  are well-aerated. No significant soft tissue abnormalities are seen. IMPRESSION: Unremarkable noncontrast CT of the head. Electronically Signed   By: Roanna RaiderJeffery  Chang M.D.   On: 05/29/2016 03:33   I have personally reviewed and evaluated these images and lab results as part of my medical decision-making.  MDM   Final diagnoses:  Hyperglycemia  Bell's palsy   Patient presents to the emergency department for evaluation of right facial numbness. Symptoms ongoing for 2 or 3 days. Patient was seen on June 11 when the symptoms began and had a normal head CT. Patient only had slight facial drooping at that time. Today, however, facial drooping is much more pronounced. The pattern is consistent with facial nerve palsy, not stroke.  Patient is a diabetic. He apparently is only taking Glucophage currently. Blood sugar was extremely high last time he was seen and then again tonight. I suspect that his Bell's palsy likely secondary to his uncontrolled diabetes. He will not be able to tolerate high-dose prednisone, as is normally prescribed for acute Bell's palsy. I did  discuss with him the significant risk of using this medication versus the small benefit. He can be given acyclovir, although I suspect that this will not help, and also will not hurt.  Was administered IV fluids and insulin here in the ER with improvement of his blood sugar. Reviewing his records reveals that at one point he was on Glucophage and Glucotrol. He will need a second agent restarted and primary care follow-up.  I personally performed the services described in this documentation, which was scribed in my presence. The recorded information has been reviewed and is accurate.    Gilda Crease, MD 05/31/16 [phone]

## 2016-05-31 NOTE — Discharge Instructions (Signed)
Parlisis facial (Bell Palsy) La parlisis facial es una afeccin en la que se paralizan los msculos de un lado de la cara. Esto a menudo provoca la cada de un lado de la cara. Es una afeccin frecuente y Games developer de las personas se recuperan por completo. FACTORES DE RIESGO Los factores de riesgo de la parlisis facial incluyen:  Psychiatrist.  Diabetes.  Una infeccin provocada por un virus, como las infecciones que causan llagas peribucales. CAUSAS  La parlisis facial es ocasionada por un dao o una inflamacin de un nervio de la cara. No est claro por qu sucede, pero puede ocurrir a causa de una infeccin provocada por un virus. La mayora de las veces se desconoce la causa. Blake Divine SNTOMAS  Los sntomas pueden variar de leves a graves y pueden durar varias horas. Entre los sntomas se pueden incluir los siguientes:  No poder Education officer, environmental lo siguiente:  Surveyor, mining o las dos cejas.  Cerrar [employer] ojos.  Sentir partes de la cara (entumecimiento facial).  Cada del prpado y la comisura de la boca.  Debilidad en la cara.  Parlisis de la mitad de la cara.  Prdida del gusto.  Sensibilidad a los ruidos fuertes.  Dificultad para Product manager.  Lagrimeo del ojo afectado.  Sequedad del ojo afectado.  Babeo.  Dolor detrs de [employer]. DIAGNSTICO  El diagnstico de la parlisis facial puede incluir:  Un examen fsico y Neomia Dear historia clnica.  Una resonancia magntica.  Una tomografa computarizada.  Electromiograma (EMG). Esta prueba se realiza para controlar el funcionamiento de los nervios. TRATAMIENTO  El tratamiento puede incluir medicamentos antivirales para ayudar a reducir la duracin de Astronomer. En ocasiones, el tratamiento no es necesario y los sntomas desaparecen por s solos. INSTRUCCIONES PARA EL CUIDADO EN EL HOGAR   Tome los medicamentos solamente como se lo haya indicado el mdico.  Hgase masajes y realice los ejercicios faciales, como  se lo haya indicado el mdico.  Si el ojo est afectado:  Use gotas oftlmicas con efecto hidratante para prevenir la sequedad del ojo, como se lo haya indicado el mdico.  Proteja el ojo como se lo haya indicado el mdico. SOLICITE ATENCIN MDICA SI:  Los sntomas no mejoran o empeoran.  Babea.  El ojo est rojo, irritado o le duele. SOLICITE ATENCIN MDICA DE INMEDIATO SI:   Siente otra parte del cuerpo dbil o adormecida.  Tiene dificultad para tragar.  Tiene fiebre adems de los sntomas de la parlisis facial.  Siente dolor en el cuello. ASEGRESE DE QUE:   Comprende estas instrucciones.  Controlar su afeccin.  Recibir ayuda de inmediato si no mejora o si empeora.   Esta informacin no tiene Theme park manager el consejo del mdico. Asegrese de hacerle al mdico cualquier pregunta que tenga.   Document Released: 12/05/2005 Document Revised: 08/26/2015 Elsevier Interactive Patient Education 2016 [employer].  Control del nivel de glucosa en la sangre - Adultos (Blood Glucose Monitoring, Adult) El control de la glucosa en la sangre (tambin llamada azcar en la sangre) lo ayudar a tener la diabetes bajo control. Tambin ayuda a que usted y Lexicographer la diabetes y determinen si el tratamiento es Engineer, manufacturing. POR QU HAY QUE CONTROLAR LA GLUCOSA EN LA SANGRE?  Esto puede ayudar a comprender de United Stationers, la actividad fsica y los medicamentos inciden en los niveles de [place].  Le permite conocer el nivel de glucosa en la sangre en cualquier momento dado.  Puede saber rpidamente si el nivel es bajo (hipoglucemia) o alto (hiperglucemia).  Puede ser de ayuda para que usted y el mdico sepan cmo Presenter, broadcastingajustar los medicamentos,  y para entender cmo controlar una enfermedad o ajustar los medicamentos para hacer ejercicio. CUNDO DEBE HACERSE LAS PRUEBAS? El mdico lo ayudar a decidir con qu frecuencia deber AGCO Corporationcontrolar los niveles de glucosa  en la [place]angre. Esto puede depender del tipo de diabetes que tenga, su control de la diabetes o los tipos de medicamentos que tome. Asegrese de anotar todos los valores de la glucosa en la Uraniasangre, de modo que esta informacin pueda ser revisada por su mdico. A continuacin puede ver ejemplos de los momentos para Education officer, environmentalrealizar la prueba que el mdico puede Neurosurgeonsugerir. Diabetes tipo1  Mdaselo al menos 2 veces al da si la diabetes est bien controlada, si B[place]a una bomba de insulina o si se aplica muchas inyecciones diarias.  Si la diabetes no est bien controlada o si est enfermo, puede ser necesario que se controle con ms frecuencia.  Es recomendable que tambin lo mida en estas oportunidades:  Antes de cada inyeccin de insulina.  Antes y despus de hacer ejercicio.  Entre las comidas y 2horas despus de Arts administratorcomer.  Ocasionalmente, entre las 2:00a.m. y las 3:00a.m. Diabetes tipo2  Si est utilizando insulina, realice la medicin al menos 2 veces al C.H. Robinson Worldwideda. Sin embargo, es Insurance claims handlermejor hacer una medicin antes de cada inyeccin de Bath Cornerinsulina.  Si toma medicamentos por boca (va oral), hgase la prueba 2veces por da.  Si sigue una dieta controlada, hgase la prueba una vez por da.  Si la diabetes no est bien controlada o si est enfermo, puede ser necesario que se controle con ms frecuencia. CMO CONTROLAR EL NIVEL DE GLUCOSA EN LA SANGRE Insumos necesarios  Medidor de glucosa en la sangre.  Tiras reactivas para el medidor. Cada medidor tiene sus propias tiras reactivas. Debe usar las tiras reactivas correspondientes a su medidor.  Una aguja para pinchar (lanceta).  Un dispositivo que sujeta la lanceta (dispositivo de puncin).  Un diario o libro de anotaciones para YRC Worldwideanotar los resultados. Procedimiento  Lave sus manos con agua y [place]. No se recomienda usar alcohol.  Pnchese el costado del dedo (no la punta) con Optometristla lanceta.  Apriete suavemente el dedo hasta que aparezca una pequea  gota de Allenportsangre.  Siga las instrucciones que vienen con el medidor para Public affairs consultantinsertar la tira Firefighterreactiva, Contractoraplicar la sangre sobre la tira y usar el medidor de Horticulturist, commercialglucosa en la sangre. Otras zonas de las que se puede tomar sangre para la prueba Algunos medidores le permiten tomar sangre para la prueba de otras zonas del cuerpo (que no son el dedo). Estas reas se llaman sitios alternativos. Los sitios alternativos ms comunes son los siguientes:  El Product managerantebrazo.  El muslo.  La zona posterior de la parte inferior de la pierna.  La palma de la mano. El flujo de sangre en estas zonas es ms lento. Por lo tanto, los valores de glucosa en la sangre que obtenga pueden estar demorados, y los nmeros son diferentes de los que obtiene de los dedos. No saque sangre de sitios alternativos si cree que tiene hipoglucemia. Los valores no sern precisos. Siempre extraiga del dedo si tiene hipoglucemia. Adems, si no puede darse cuenta cuando tiene bajos los niveles (hipoglucemia asintomtica), siempre extraiga sangre de los dedos para los controles de glucosa en la [place] Hon[place]angre. CONSEJOS ADICIONALES PARA EL CONTROL DE LA GLUCOSA  No vuelva a utilizar  las lancetas.  Siempre tenga los insumos a mano.  Todos los medidores de glucosa incluyen un nmero de telfono "directo", disponible las 24 horas, al que podr llamar si tiene preguntas o [place].  Ajuste (calibre) el medidor de glucosa con una solucin de control despus de terminar algunas cajas de tiras reactivas. LLEVE REGISTROS DE LOS NIVELES DE GLUCOSA EN LA SANGRE Es recomendable llevar un diario o un registro de los valores de glucosa en la [place]. La Har[employer]son de los medidores de glucosa, sino todos, conservan el registro de la glucosa en el dispositivo. Algunos medidores permiten descargar los registros a su computadora. Llevar un registro de los valores de glucosa en la sangre es especialmente til si desea observar los patrones. Haga anotaciones simultneas con  la Microbiologist de los valores de glucosa en la sangre debido a que podra olvidar lo que ocurri en el momento exacto. Llevar un buen registro los ayudar a usted y al mdico a Printmaker juntos para Personnel officer un buen control de la diabetes.    Esta informacin no tiene Theme park manager el consejo del mdico. Asegrese de hacerle al mdico cualquier pregunta que tenga.   Document Released: 12/05/2005 Document Revised: 12/26/2014 Elsevier Interactive Patient Education [employer]

## 2016-06-06 ENCOUNTER — Encounter: Payer: Self-pay | Admitting: Family Medicine

## 2016-06-06 ENCOUNTER — Ambulatory Visit (INDEPENDENT_AMBULATORY_CARE_PROVIDER_SITE_OTHER): Payer: BLUE CROSS/BLUE SHIELD | Admitting: Family Medicine

## 2016-06-06 VITALS — BP 90/68 | HR 87 | Temp 98.5°F | Resp 16 | Ht 64.5 in | Wt 194.2 lb

## 2016-06-06 DIAGNOSIS — E119 Type 2 diabetes mellitus without complications: Secondary | ICD-10-CM | POA: Diagnosis not present

## 2016-06-06 DIAGNOSIS — J209 Acute bronchitis, unspecified: Secondary | ICD-10-CM

## 2016-06-06 DIAGNOSIS — J029 Acute pharyngitis, unspecified: Secondary | ICD-10-CM

## 2016-06-06 MED ORDER — AMOXICILLIN-POT CLAVULANATE 875-125 MG PO TABS: 1.0000 | ORAL_TABLET | Freq: Two times a day (BID) | ORAL | Status: DC

## 2016-06-06 NOTE — Patient Instructions (Signed)
Bronquitis aguda  (Acute Bronchitis)  La bronquitis es una inflamación de las vías respiratorias que se extienden desde la tráquea hasta los pulmones (bronquios). La inflamación produce la formación de mucosidad. Esto produce tos, que es el síntoma más frecuente de la bronquitis.   Cuando la bronquitis es aguda, generalmente comienza de manera súbita y desaparece luego de un par de semanas. El hábito de fumar, las alergias y el asma pueden empeorar la bronquitis. Los episodios repetidos de bronquitis pueden causar más problemas pulmonares.   CAUSAS  La causa más frecuente de bronquitis aguda es el mismo virus que produce el resfrío. El virus puede propagarse de una persona a la otra (contagioso) a través de la tos y los estornudos, y al tocar objetos contaminados.  SIGNOS Y SÍNTOMAS   · Tos.  · Fiebre.  · Tos con mucosidad.  · Dolores en el cuerpo.  · Congestión en el pecho.  · Escalofríos.  · Falta de aire.  · Dolor de garganta.  DIAGNÓSTICO   La bronquitis aguda en general se diagnostica con un examen físico. El médico también le hará preguntas sobre su historia clínica. En algunos casos se indican otros estudios, como radiografías, para descartar otras enfermedades.   TRATAMIENTO   La bronquitis aguda generalmente desaparece en un par de semanas. Con frecuencia, no es necesario realizar un tratamiento. Los medicamentos se indican para aliviar la fiebre o la tos. Generalmente, no es necesario el uso de antibióticos, pero pueden recetarse en ciertas ocasiones. En algunos casos, se recomienda el uso de un inhalador para mejorar la falta de aire y controlar la tos. Un vaporizador de aire frío podrá ayudarlo a disolver las secreciones bronquiales y facilitar su eliminación.   INSTRUCCIONES PARA EL CUIDADO EN EL HOGAR   · Descanse lo suficiente.  · Beba líquidos en abundancia para mantener la orina de color claro o amarillo pálido (excepto que padezca una enfermedad que requiera la restricción de líquidos). El aumento  de líquidos puede ayudar a que las secreciones respiratorias (esputo) sean menos espesas y a reducir la congestión del pecho, y evitará la deshidratación.  · Tome los medicamentos solamente como se lo haya indicado el médico.  · Si le recetaron antibióticos, asegúrese de terminarlos, incluso si comienza a sentirse mejor.  · Evite fumar o aspirar el humo de otros fumadores. La exposición al humo del cigarrillo o a irritantes químicos hará que la bronquitis empeore. Si fuma, considere el uso de goma de mascar o la aplicación de parches en la piel que contengan nicotina para aliviar los síntomas de abstinencia. Si deja de fumar, sus pulmones se curarán más rápido.  · Reduzca la probabilidad de otro episodio de bronquitis aguda lavando sus manos con frecuencia, evitando a las personas que tengan síntomas y tratando de no tocarse las manos con la boca, la nariz o los ojos.  · Concurra a todas las visitas de control como se lo haya indicado el médico.  SOLICITE ATENCIÓN MÉDICA SI:  Los síntomas no mejoran después de una semana de tratamiento.   SOLICITE ATENCIÓN MÉDICA DE INMEDIATO SI:  · Comienza a tener fiebre o escalofríos cada vez más intensos.  · Siente dolor en el pecho.  · Le falta el aire de manera preocupante.  · La flema tiene sangre.  · Se deshidrata.  · Se desmaya o siente que va a desmayarse de forma repetida.  · Tiene vómitos que se repiten.  · Tiene un dolor de cabeza intenso.  ASEGÚRESE DE QUE:   ·   Comprende estas instrucciones.  · Controlará su afección.  · Recibirá ayuda de inmediato si no mejora o si empeora.     Esta información no tiene como fin reemplazar el consejo del médico. Asegúrese de hacerle al médico cualquier pregunta que tenga.     Document Released: 12/05/2005 Document Revised: 12/26/2014  Elsevier Interactive Patient Education ©2016 Elsevier Inc.

## 2016-06-06 NOTE — Progress Notes (Signed)
This is 60 year old gentleman, Hispanic, was seen in the company of his daughter and wife. He's had 3-4 days of sore throat and productive cough with night sweats.  About a week ago he was diagnosed with Bell's palsy and treated with Valtrex. He has not noticed any improvement.  Patient also has diabetes and his blood sugar this morning was 220. Since higher than what he usually runs. He is taking fluids well and keeping his temperature down with Tylenol.   Objective:  BP 90/68 mmHg  Pulse 87  Temp(Src) 98.5 F (36.9 C) (Oral)  Resp 16  Ht 5' 4.5" (1.638 m)  Wt 194 lb 3.2 oz (88.089 kg)  BMI 32.83 kg/m2  SpO2 98% No acute distress HEENT: Markedly erythematous and mildly swollen posterior pharynx without exudates, normal TMs, supple neck without adenopathy. Left facial muscles are weak with mild droop, although there is some movement and is able to close his eyes. Chest: Few rales in the right base. Heart: Regular no murmur  Assessment:  Acute bronchitis, pharyngitis with mild elevation of blood sugar values.  Plan: Type 2 diabetes mellitus without complication, without long-term current use of insulin (HCC)  Acute pharyngitis, unspecified etiology - Plan: amoxicillin-clavulanate (AUGMENTIN) 875-125 MG tablet  Acute bronchitis, unspecified organism - Plan: amoxicillin-clavulanate (AUGMENTIN) 875-125 MG tablet  Elvina SidleKurt Britanny Marksberry, MD

## 2016-06-29 ENCOUNTER — Ambulatory Visit (INDEPENDENT_AMBULATORY_CARE_PROVIDER_SITE_OTHER): Payer: BLUE CROSS/BLUE SHIELD | Admitting: Family Medicine

## 2016-06-29 VITALS — BP 122/80 | HR 94 | Temp 99.0°F | Resp 18 | Ht 64.5 in | Wt 205.0 lb

## 2016-06-29 DIAGNOSIS — R911 Solitary pulmonary nodule: Secondary | ICD-10-CM | POA: Diagnosis not present

## 2016-06-29 DIAGNOSIS — E119 Type 2 diabetes mellitus without complications: Secondary | ICD-10-CM | POA: Diagnosis not present

## 2016-06-29 DIAGNOSIS — E7800 Pure hypercholesterolemia, unspecified: Secondary | ICD-10-CM

## 2016-06-29 LAB — POCT URINALYSIS DIP (MANUAL ENTRY)
Bilirubin, UA: NEGATIVE
Blood, UA: NEGATIVE
Glucose, UA: >=1000 — AB
Ketones, POC UA: NEGATIVE
Leukocytes, UA: NEGATIVE
Nitrite, UA: NEGATIVE
Protein Ur, POC: NEGATIVE
Spec Grav, UA: 1.02
Urobilinogen, UA: 0.2
pH, UA: 5.5

## 2016-06-29 LAB — POCT CBC
Granulocyte percent: 59.1 % (ref 37–80)
HCT, POC: 43 % — AB (ref 43.5–53.7)
Hemoglobin: 15.3 g/dL (ref 14.1–18.1)
Lymph, poc: 3.5 — AB (ref 0.6–3.4)
MCH, POC: 32.6 pg — AB (ref 27–31.2)
MCHC: 35.6 g/dL — AB (ref 31.8–35.4)
MCV: 91.5 fL (ref 80–97)
MID (cbc): 0.3 (ref 0–0.9)
MPV: 7.9 fL (ref 0–99.8)
POC Granulocyte: 5.4 (ref 2–6.9)
POC LYMPH PERCENT: 37.6 % (ref 10–50)
POC MID %: 3.3 % (ref 0–12)
Platelet Count, POC: 196 10*3/uL (ref 142–424)
RBC: 4.7 M/uL (ref 4.69–6.13)
RDW, POC: 13 %
WBC: 9.2 10*3/uL (ref 4.6–10.2)

## 2016-06-29 LAB — GLUCOSE, POCT (MANUAL RESULT ENTRY): POC Glucose: 186 mg/dL — AB (ref 70–99)

## 2016-06-29 LAB — POCT GLYCOSYLATED HEMOGLOBIN (HGB A1C): Hemoglobin A1C: 11.8

## 2016-06-29 MED ORDER — ATORVASTATIN CALCIUM 10 MG PO TABS: 10.0000 mg | ORAL_TABLET | Freq: Every day | ORAL | Status: DC

## 2016-06-29 MED ORDER — GLIPIZIDE ER 5 MG PO TB24: 5.0000 mg | ORAL_TABLET | Freq: Two times a day (BID) | ORAL | Status: DC

## 2016-06-29 MED ORDER — METFORMIN HCL 1000 MG PO TABS: 1000.0000 mg | ORAL_TABLET | Freq: Two times a day (BID) | ORAL | Status: DC

## 2016-06-29 NOTE — Patient Instructions (Signed)
     IF you received an x-ray today, you will receive an invoice from [facility]. Please contact [facility] at [phone] with questions or concerns regarding your invoice.   IF you received labwork today, you will receive an invoice from [facility]. Please contact [facility] at [phone] with questions or concerns regarding your invoice.   Our billing staff will not be able to assist you with questions regarding bills from these companies.  You will be contacted with the lab results as soon as they are available. The fastest way to get your results is to activate your My Chart account. Instructions are located on the last page of this paperwork. If you have not heard from us regarding the results in 2 weeks, please contact this office.      

## 2016-06-29 NOTE — Progress Notes (Signed)
Subjective:    Patient ID: Lance Mason, male    DOB: Jan 12, 1956, 59 y.o.   MRN: [id number]  06/29/2016  Establish Care (diabetes)   HPI This 60 y.o. male presents to establish care.   No primary care physician.    Last physical:  Years ago. More than one year ago.   Colonoscopy:  Never; aware of guidelines; no recommendation in the past.   TDAP: 2016 Pneumovax: not sure Influenza:  2016 Eye exam:  never Dental exam:  Lives in [place] and [place].  No physician in [place].    DMII: diagnosed more than ten years ago.  Getting diabetes medication in [place].  Buys OTC.  Checks sugars; checking daily for past month; checks sugar fasting first thing in the morning.  190, 150, 220.  Fasting today; ate breakfast this morning; ate beans, meat, tortilla 2 grande, juice diet. L:  Chicken, salad cabbage, chips, tea sweet.   Likes coke. Supper:  Skips.    Bell's palsy:  Improving a lot.    Lung nodule detected on CXR in 07/2015.  Evaluated by Dr. Melvyn Novas on 09/30/15. Due for CT chest.   Review of Systems  Constitutional: Negative for fever, chills, diaphoresis, activity change, appetite change and fatigue.  Respiratory: Negative for cough and shortness of breath.   Cardiovascular: Negative for chest pain, palpitations and leg swelling.  Gastrointestinal: Negative for nausea, vomiting, abdominal pain and diarrhea.  Endocrine: Negative for cold intolerance, heat intolerance, polydipsia, polyphagia and polyuria.  Skin: Negative for color change, rash and wound.  Neurological: Negative for dizziness, tremors, seizures, syncope, facial asymmetry, speech difficulty, weakness, light-headedness, numbness and headaches.  Psychiatric/Behavioral: Negative for sleep disturbance and dysphoric mood. The patient is not nervous/anxious.     Past Medical History:  Diagnosis Date  . Diabetes mellitus without complication ([place])   . Hypertension    History reviewed. No pertinent surgical history. No  Known Allergies  Social History   Social History  . Marital status: Married    Spouse name: N/A  . Number of children: 7  . Years of education: N/A   Occupational History  .      painting   Social History Main Topics  . Smoking status: Former Smoker    Packs/day: 0.50    Years: 5.00    Types: Cigarettes    Quit date: 12/19/1994  . Smokeless tobacco: Never Used  . Alcohol use 0.0 oz/week     Comment: occasionally beer  . Drug use: No  . Sexual activity: Not on file   Other Topics Concern  . Not on file   Social History Narrative   Marital status: married      Children: seven children      Employment: Curator      Tobacco: quit smoking      Alcohol: none   Family History  Problem Relation Age of Onset  . Heart disease Mother     pacemaker/arrhythmia.  . Hypertension Mother   . Heart disease Father        Objective:    BP 122/80   Pulse 94   Temp 99 F (37.2 C)   Resp 18   Ht 5' 4.5" (1.638 m)   Wt 205 lb (93 kg)   SpO2 98%   BMI 34.64 kg/m  Physical Exam  Constitutional: He is oriented to person, place, and time. He appears well-developed and well-nourished. No distress.  HENT:  Head: Normocephalic and atraumatic.  Right Ear: External ear  normal.  Left Ear: External ear normal.  Nose: Nose normal.  Mouth/Throat: Oropharynx is clear and moist.  Eyes: Conjunctivae and EOM are normal. Pupils are equal, round, and reactive to light.  Neck: Normal range of motion. Neck supple. Carotid bruit is not present. No thyromegaly present.  Cardiovascular: Normal rate, regular rhythm, normal heart sounds and intact distal pulses.  Exam reveals no gallop and no friction rub.   No murmur heard. Pulmonary/Chest: Effort normal and breath sounds normal. He has no wheezes. He has no rales.  Abdominal: Soft. Bowel sounds are normal. He exhibits no distension and no mass. There is no tenderness. There is no rebound and no guarding.  Lymphadenopathy:    He has no cervical  adenopathy.  Neurological: He is alert and oriented to person, place, and time. No cranial nerve deficit.  Skin: Skin is warm and dry. No rash noted. He is not diaphoretic.  Psychiatric: He has a normal mood and affect. His behavior is normal.  Nursing note and vitals reviewed.  Results for orders placed or performed in visit on 06/29/16  Comprehensive metabolic panel  Result Value Ref Range   Sodium 139 135 - 146 mmol/L   Potassium 4.9 3.5 - 5.3 mmol/L   Chloride 102 98 - 110 mmol/L   CO2 28 20 - 31 mmol/L   Glucose, Bld 192 (H) 65 - 99 mg/dL   BUN 13 7 - 25 mg/dL   Creat 0.81 0.70 - 1.25 mg/dL   Total Bilirubin 0.3 0.2 - 1.2 mg/dL   Alkaline Phosphatase 79 40 - 115 U/L   AST 14 10 - 35 U/L   ALT 17 9 - 46 U/L   Total Protein 6.9 6.1 - 8.1 g/dL   Albumin 4.1 3.6 - 5.1 g/dL   Calcium 9.3 8.6 - 10.3 mg/dL  Lipid panel  Result Value Ref Range   Cholesterol 188 125 - 200 mg/dL   Triglycerides 451 (H) <150 mg/dL   HDL 30 (L) >=40 mg/dL   Total CHOL/HDL Ratio 6.3 (H) <=5.0 Ratio   VLDL NOT CALC <30 mg/dL   LDL Cholesterol NOT CALC <130 mg/dL  TSH  Result Value Ref Range   TSH 3.37 0.40 - 4.50 mIU/L  POCT glucose (manual entry)  Result Value Ref Range   POC Glucose 186 (A) 70 - 99 mg/dl  POCT glycosylated hemoglobin (Hb A1C)  Result Value Ref Range   Hemoglobin A1C 11.8   POCT CBC  Result Value Ref Range   WBC 9.2 4.6 - 10.2 K/uL   Lymph, poc 3.5 (A) 0.6 - 3.4   POC LYMPH PERCENT 37.6 10 - 50 %L   MID (cbc) 0.3 0 - 0.9   POC MID % 3.3 0 - 12 %M   POC Granulocyte 5.4 2 - 6.9   Granulocyte percent 59.1 37 - 80 %G   RBC 4.70 4.69 - 6.13 M/uL   Hemoglobin 15.3 14.1 - 18.1 g/dL   HCT, POC 43.0 (A) 43.5 - 53.7 %   MCV 91.5 80 - 97 fL   MCH, POC 32.6 (A) 27 - 31.2 pg   MCHC 35.6 (A) 31.8 - 35.4 g/dL   RDW, POC 13.0 %   Platelet Count, POC 196 142 - 424 K/uL   MPV 7.9 0 - 99.8 fL  POCT urinalysis dipstick  Result Value Ref Range   Color, UA yellow yellow   Clarity, UA  clear clear   Glucose, UA >=1,000 (A) negative   Bilirubin, UA negative  negative   Ketones, POC UA negative negative   Spec Grav, UA 1.020    Blood, UA negative negative   pH, UA 5.5    Protein Ur, POC negative negative   Urobilinogen, UA 0.2    Nitrite, UA Negative Negative   Leukocytes, UA Negative Negative       Assessment & Plan:   1. Type 2 diabetes mellitus without complication, without long-term current use of insulin (HCC)   2. Lung nodule   3. Pure hypercholesterolemia     Orders Placed This Encounter  Procedures  . CT CHEST WO CONTRAST    Wt-200/no needs/spanish speaking-grandaughter to interpret (WILL SIGN WAIVER TO REFUSE [place] [facility] INTERPRETERS) Ins-bcbs/np and grandaughter Epic order Reminder call : lmom / mdc / 07/05/16    Standing Status:   Future    Number of Occurrences:   1    Standing Expiration Date:   08/30/2017    Order Specific Question:   Reason for Exam (SYMPTOM  OR DIAGNOSIS REQUIRED)    Answer:   follow-up on lung nodule    Order Specific Question:   Preferred imaging location?    Answer:   [facility]  . Comprehensive metabolic panel    Order Specific Question:   Has the patient fasted?    Answer:   Yes  . Lipid panel    Order Specific Question:   Has the patient fasted?    Answer:   Yes  . TSH  . Ambulatory referral to Nutrition and Diabetic Education    Referral Priority:   Routine    Referral Type:   Consultation    Referral Reason:   Specialty Services Required    Number of Visits Requested:   1  . POCT glucose (manual entry)  . POCT glycosylated hemoglobin (Hb A1C)  . POCT CBC  . POCT urinalysis dipstick   Meds ordered this encounter  Medications  . Blood Glucose Monitoring Suppl (ACCU-CHEK AVIVA PLUS) w/Device KIT  . ACCU-CHEK AVIVA PLUS test strip  . ACCU-CHEK SOFTCLIX LANCETS lancets  . metFORMIN (GLUCOPHAGE) 1000 MG tablet    Sig: Take 1 tablet (1,000 mg total) by mouth 2 (two) times daily with a meal.     Dispense:  180 tablet    Refill:  1  . glipiZIDE (GLUCOTROL XL) 5 MG 24 hr tablet    Sig: Take 1 tablet (5 mg total) by mouth 2 (two) times daily with a meal.    Dispense:  180 tablet    Refill:  1  . atorvastatin (LIPITOR) 10 MG tablet    Sig: Take 1 tablet (10 mg total) by mouth daily.    Dispense:  90 tablet    Refill:  3    Return in about 4 months (around 10/30/2016) for  recheck diabetes.    Dhalia Zingaro Elayne Guerin, M.D. [facility] [address] [place], [place]  [phone] phone [phone] fax

## 2016-06-30 LAB — LIPID PANEL
CHOL/HDL RATIO: 6.3 ratio — AB (ref <=5.0)
Cholesterol: 188 mg/dL (ref 125–200)
HDL: 30 mg/dL — AB (ref >=40)
Triglycerides: 451 mg/dL — ABNORMAL HIGH (ref <150)

## 2016-06-30 LAB — TSH: TSH: 3.37 m[IU]/L (ref 0.40–4.50)

## 2016-06-30 LAB — COMPREHENSIVE METABOLIC PANEL
ALK PHOS: 79 U/L (ref 40–115)
ALT: 17 U/L (ref 9–46)
AST: 14 U/L (ref 10–35)
Albumin: 4.1 g/dL (ref 3.6–5.1)
BUN: 13 mg/dL (ref 7–25)
CALCIUM: 9.3 mg/dL (ref 8.6–10.3)
CHLORIDE: 102 mmol/L (ref 98–110)
CO2: 28 mmol/L (ref 20–31)
Creat: 0.81 mg/dL (ref 0.70–1.25)
GLUCOSE: 192 mg/dL — AB (ref 65–99)
POTASSIUM: 4.9 mmol/L (ref 3.5–5.3)
Sodium: 139 mmol/L (ref 135–146)
Total Bilirubin: 0.3 mg/dL (ref 0.2–1.2)
Total Protein: 6.9 g/dL (ref 6.1–8.1)

## 2016-07-06 ENCOUNTER — Ambulatory Visit
Admission: RE | Admit: 2016-07-06 | Discharge: 2016-07-06 | Disposition: A | Discharge status: Home / Self Care | Payer: BLUE CROSS/BLUE SHIELD | Source: Ambulatory Visit | Attending: Family Medicine | Admitting: Family Medicine

## 2016-07-06 DIAGNOSIS — R911 Solitary pulmonary nodule: Secondary | ICD-10-CM

## 2016-07-06 DIAGNOSIS — R918 Other nonspecific abnormal finding of lung field: Secondary | ICD-10-CM | POA: Diagnosis not present

## 2016-07-20 ENCOUNTER — Telehealth: Payer: Self-pay

## 2016-07-20 NOTE — Telephone Encounter (Signed)
Pt's granddaughter called in for pt's CT CHEST WO CONTRAST results  from 07/06/16. Please advise at  [phone]

## 2016-07-20 NOTE — Telephone Encounter (Signed)
Please review

## 2016-07-21 NOTE — Telephone Encounter (Signed)
I spoke with grandaughter Steward Drone about CT scan.  Per Joni Reining scan has not changed since his prior CT and to follow up with either Dr. Katrinka Blazing in 4 mos or to just follow up with Dr. Sherene Sires.

## 2016-07-22 ENCOUNTER — Ambulatory Visit (INDEPENDENT_AMBULATORY_CARE_PROVIDER_SITE_OTHER): Payer: BLUE CROSS/BLUE SHIELD

## 2016-07-22 ENCOUNTER — Ambulatory Visit (INDEPENDENT_AMBULATORY_CARE_PROVIDER_SITE_OTHER): Payer: BLUE CROSS/BLUE SHIELD | Admitting: Urgent Care

## 2016-07-22 VITALS — BP 128/80 | HR 105 | Temp 98.8°F | Resp 18 | Ht 64.5 in | Wt 209.6 lb

## 2016-07-22 DIAGNOSIS — M79601 Pain in right arm: Secondary | ICD-10-CM | POA: Diagnosis not present

## 2016-07-22 DIAGNOSIS — M79602 Pain in left arm: Secondary | ICD-10-CM | POA: Diagnosis not present

## 2016-07-22 DIAGNOSIS — R0789 Other chest pain: Secondary | ICD-10-CM | POA: Diagnosis not present

## 2016-07-22 DIAGNOSIS — E782 Mixed hyperlipidemia: Secondary | ICD-10-CM | POA: Diagnosis not present

## 2016-07-22 DIAGNOSIS — M47812 Spondylosis without myelopathy or radiculopathy, cervical region: Secondary | ICD-10-CM | POA: Diagnosis not present

## 2016-07-22 DIAGNOSIS — E119 Type 2 diabetes mellitus without complications: Secondary | ICD-10-CM

## 2016-07-22 LAB — URIC ACID: Uric Acid, Serum: 5.2 mg/dL (ref 4.0–8.0)

## 2016-07-22 LAB — SEDIMENTATION RATE: SED RATE: 11 mm/h (ref 0–20)

## 2016-07-22 MED ORDER — CYCLOBENZAPRINE HCL 5 MG PO TABS: 5.0000 mg | ORAL_TABLET | Freq: Three times a day (TID) | ORAL | 1 refills | Status: DC | PRN

## 2016-07-22 MED ORDER — GABAPENTIN 300 MG PO CAPS: 300.0000 mg | ORAL_CAPSULE | Freq: Three times a day (TID) | ORAL | 3 refills | Status: DC

## 2016-07-22 NOTE — Progress Notes (Signed)
MRN: [id number] DOB: 12-Jan-1956  Subjective:   Lance Mason is a 60 y.o. male presenting for chief complaint of Arm Pain (bilateral arm pain per patient this is ongoing and has not gotten any better )  Reports several month history of bilateral arm pain, trapezius pain. Pain is sharp in nature, occurs randomly but can be worse at night. He seldom has chest pain associated with this but does admit to occasional, transient chest pain. Patient does not work. 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 Cannot recall inciting event. Has tried otc calcium, natural supplements without any relief. Diet is non-compliant. Hydrates with mostly water, rarely drinks sodas, no alcohol use. Diabetes is poorly controlled, last A1c was >11. Denies falls, shooting pain, numbness or tingling, neck pain, fever, rashes. Patient is on Lipitor but just started this in the past month, his arm pain has not changed in nature.   Lance Mason has a current medication list which includes the following prescription(s): accu-chek aviva plus, accu-chek softclix lancets, atorvastatin, blood glucose meter kit and supplies, accu-chek aviva plus, glipizide, and metformin. Also has No Known Allergies.  Lance Mason  has a past medical history of Diabetes mellitus without complication ([place]) and Hypertension. Also  has no past surgical history on file.  Objective:   Vitals: BP 128/80 (BP Location: Left Arm, Patient Position: Sitting, Cuff Size: Normal)   Pulse (!) 105   Temp 98.8 F (37.1 C) (Oral)   Resp 18   Ht 5' 4.5" (1.638 m)   Wt 209 lb 9.6 oz (95.1 kg)   SpO2 98%   BMI 35.42 kg/m   Physical Exam  Constitutional: He is oriented to person, place, and time. He appears well-developed and well-nourished.  Cardiovascular: Normal rate, regular rhythm and intact distal pulses.  Exam reveals no gallop and no friction rub.   No murmur heard. Pulmonary/Chest: No respiratory distress. He  has no wheezes. He has no rales.  Musculoskeletal:       Right elbow: He exhibits normal range of motion, no swelling, no effusion, no deformity and no laceration. Tenderness found.       Left elbow: He exhibits normal range of motion, no swelling, no effusion, no deformity and no laceration. Tenderness found.       Right wrist: He exhibits normal range of motion, no tenderness, no bony tenderness, no swelling, no effusion, no crepitus, no deformity and no laceration.       Left wrist: He exhibits normal range of motion, no tenderness, no bony tenderness, no swelling, no effusion, no crepitus and no deformity.       Cervical back: He exhibits normal range of motion, no tenderness (Negative Spurling maneuver), no bony tenderness, no swelling, no edema, no deformity and no spasm.       Right upper arm: He exhibits tenderness. He exhibits no bony tenderness, no swelling, no edema, no deformity and no laceration.       Left upper arm: He exhibits tenderness. He exhibits no bony tenderness, no swelling, no edema, no deformity and no laceration.       Right forearm: He exhibits tenderness. He exhibits no bony tenderness, no swelling, no edema, no deformity and no laceration.       Left forearm: He exhibits tenderness. He exhibits no bony tenderness, no swelling, no edema, no deformity and no laceration.  Neurological: He is alert and oriented to person, place, and time.  Skin: Skin is warm and dry.  Dg Cervical Spine Complete  Result Date: 07/22/2016 CLINICAL DATA:  Bilateral arm pain for several months. No known injury. EXAM: CERVICAL SPINE - COMPLETE 4+ VIEW COMPARISON:  None. FINDINGS: Minimal anterior spur formation at the see 3 4 and C4-5 levels. Mild uncinate spur formation producing minimal foraminal stenosis on the right at the C4-5 level. Mild facet hypertrophy on the right at the C4-5, C5-6 and C6-7 levels. The more inferior foramina are not adequately assessed on the right due to inadequate  obliquity. No foraminal stenosis on the left at any level. IMPRESSION: Mild degenerative changes, as described above. Electronically Signed   By: Claudie Revering M.D.   On: 07/22/2016 17:44   Assessment and Plan :   1. Bilateral arm pain - Unclear etiology. Will address possible diabetic neuropathy with Gabapentin. Offered patient Flexeril as well for muscle spasms, counseled on better posture. RTC in 4 weeks if symptoms persist.  2. Type 2 diabetes mellitus without complication, without long-term current use of insulin ([place]) 3. Atypical chest pain 4. Mixed hyperlipidemia - Unclear etiology, counseled on lifestyle modifications. Referral to cardiologist pending for screen of heart disease for his occasional atypical chest pain and in case this is a source of his bilateral arm pain.  Jaynee Eagles, PA-C [facility] [phone] 07/22/2016 4:41 PM

## 2016-07-22 NOTE — Patient Instructions (Addendum)
Gabapentin - Puede comenzar con 1 pastilla dos veces diaramente por 3 dias. Y luego aumente el dosis a 1 pastilla 3 veces diaramente.  Flexeril - Si le causa sueno, solo tome 1-2 pastillas por la noche. Si no, tome 1 pastilla 3 veces diaramente.    IF you received an x-ray today, you will receive an invoice from [facility]. Please contact [facility] at [phone] with questions or concerns regarding your invoice.   IF you received labwork today, you will receive an invoice from [employer]. Please contact [facility] at [phone] with questions or concerns regarding your invoice.   Our billing staff will not be able to assist you with questions regarding bills from these companies.  You will be contacted with the lab results as soon as they are available. The fastest way to get your results is to activate your My Chart account. Instructions are located on the last page of this paperwork. If you have not heard from [place] regarding the results in 2 weeks, please contact this office.

## 2016-07-23 ENCOUNTER — Encounter: Payer: Self-pay | Admitting: Urgent Care

## 2016-08-05 DIAGNOSIS — R079 Chest pain, unspecified: Secondary | ICD-10-CM | POA: Diagnosis not present

## 2016-08-05 DIAGNOSIS — R0789 Other chest pain: Secondary | ICD-10-CM | POA: Diagnosis not present

## 2016-08-05 DIAGNOSIS — E785 Hyperlipidemia, unspecified: Secondary | ICD-10-CM | POA: Diagnosis not present

## 2016-08-05 DIAGNOSIS — E119 Type 2 diabetes mellitus without complications: Secondary | ICD-10-CM | POA: Diagnosis not present

## 2016-08-05 DIAGNOSIS — M79601 Pain in right arm: Secondary | ICD-10-CM | POA: Diagnosis not present

## 2016-08-11 DIAGNOSIS — E7800 Pure hypercholesterolemia, unspecified: Secondary | ICD-10-CM | POA: Insufficient documentation

## 2016-08-12 DIAGNOSIS — R079 Chest pain, unspecified: Secondary | ICD-10-CM | POA: Diagnosis not present

## 2016-08-17 ENCOUNTER — Ambulatory Visit (INDEPENDENT_AMBULATORY_CARE_PROVIDER_SITE_OTHER): Payer: BLUE CROSS/BLUE SHIELD | Admitting: Urgent Care

## 2016-08-17 VITALS — BP 124/84 | HR 101 | Temp 98.2°F | Resp 17 | Ht 64.5 in | Wt 207.0 lb

## 2016-08-17 DIAGNOSIS — E119 Type 2 diabetes mellitus without complications: Secondary | ICD-10-CM

## 2016-08-17 DIAGNOSIS — M79601 Pain in right arm: Secondary | ICD-10-CM | POA: Diagnosis not present

## 2016-08-17 DIAGNOSIS — M79602 Pain in left arm: Secondary | ICD-10-CM

## 2016-08-17 NOTE — Progress Notes (Signed)
    MRN: [id number] DOB: June 04, 1956  Subjective:   Lance Mason is a 60 y.o. male presenting for follow up on bilateral arm pain, numbness and tingling of arms. He was last seen for the same on 07/22/2016. X-rays showed mild arthritis of C3-5. He was started on Gabapentin and Flexeril. He is taking each once daily. Denies trauma, falls, decreased strength, weakness. He was seen by cardiologist and had normal findings. His blood sugar runs >200's fasting. He admits dietary non-compliance.   Lance Mason has a current medication list which includes the following prescription(s): accu-chek aviva plus, accu-chek softclix lancets, atorvastatin, blood glucose meter kit and supplies, accu-chek aviva plus, cyclobenzaprine, gabapentin, glipizide, and metformin. Also has No Known Allergies.  Lance Mason  has a past medical history of Diabetes mellitus without complication ([place]) and Hypertension. Also  has no past surgical history on file.  Objective:   Vitals: BP 124/84 (BP Location: Right Arm, Patient Position: Sitting, Cuff Size: Large)   Pulse (!) 101   Temp 98.2 F (36.8 C) (Oral)   Resp 17   Ht 5' 4.5" (1.638 m)   Wt 207 lb (93.9 kg)   SpO2 97%   BMI 34.98 kg/m   Physical Exam  Constitutional: He is oriented to person, place, and time. He appears well-developed and well-nourished.  Cardiovascular: Normal rate.   Pulmonary/Chest: Effort normal.  Musculoskeletal:       Cervical back: He exhibits spasm (over trapezius bilaterally). He exhibits normal range of motion, no tenderness, no bony tenderness, no swelling, no edema, no deformity and no laceration.  Strength 5/5 for arms bilaterally. DTR 1+. Negative Spurling maneuver.  Neurological: He is alert and oriented to person, place, and time.   Assessment and Plan :   1. Bilateral arm pain 2. Type 2 diabetes mellitus without complication, without long-term current use of insulin ([place]) - Patient is to increase Gabapentin to TID.  Recommended better posture for his back. Will schedule APAP. Referral to ortho is pending. I counseled on better blood sugar control - I will hold off on steroid course for now, have him see ortho, increase meds previously prescribed.  Jaynee Eagles, PA-C [facility] [phone] 08/17/2016 11:50 AM

## 2016-08-17 NOTE — Patient Instructions (Addendum)
Tylenol Tome 500mg  cada 6 horas o tome 750mg  cada 8 horas para dolor y inflammacion associado con su artritis.    Artritis (Arthritis) El trmino artritis se [place] comnmente para hacer referencia al dolor de las articulaciones o a la enfermedad articular. Hay ms de 100tipos de artritis. CAUSAS La causa ms frecuente de esta afeccin es el desgaste de una articulacin. Algunas otras causas son las siguientes:  Gota.  Inflamacin de una articulacin.  Una infeccin de Risk analyst.  Esguinces y otras lesiones cerca de la articulacin.  Una reaccin farmacolgica o alrgica. En algunos casos, es posible que la causa no se conozca. SNTOMAS El sntoma principal de esta afeccin es el dolor de la articulacin con el movimiento. Otros sntomas pueden ser los siguientes:  Enrojecimiento, hinchazn o rigidez de Risk analyst.  Calor que emana de Nurse, learning disability.  Grant Ruts.  Sensacin generalizada de estar enfermo. DIAGNSTICO Esta afeccin se puede diagnosticar mediante un examen fsico y Judy Pimple ellos:  Anlisis de [place].  Anlisis de [place].  Estudios de diagnstico por imgenes, como una resonancia magntica (RM), radiografas o una tomografa computarizada (TC). A veces, se extrae lquido de una articulacin para analizarlo. TRATAMIENTO El tratamiento de esta afeccin puede incluir lo siguiente:  El tratamiento de la causa, si se conoce.  Reposo.  Mantener elevada la articulacin.  Aplicar compresas fras o calientes en la articulacin.  Medicamentos para [employer] sntomas y Social research officer, government.  Inyecciones de un corticoide, como cortisona, en la articulacin para ayudar a Engineer, materials y Social research officer, government. Segn la causa de la artritis, tal vez haya que hacer cambios en el estilo de vida para reducir la carga sobre la articulacin. Algunos de los cambios incluyen realizar ms actividad fsica y Publishing copy de [place]. INSTRUCCIONES  PARA EL CUIDADO EN EL HOGAR Medicamentos  [employer] de venta libre y los recetados solamente como se lo haya indicado el mdico.  No tome aspirina para Acupuncturist dolor si se sospecha la presencia de [place]. Actividades  Ponga en reposo la articulacin como se lo haya indicado el mdico. El reposo es importante cuando la enfermedad est activa y la articulacin le duele, est hinchada o rgida.  Evite las actividades que intensifiquen Chief Technology Officer. Es importante Contractor equilibrio entre la actividad y el reposo.  Con frecuencia, realice ejercicios de flexibilidad articular como se lo haya indicado el mdico. Intente realizar ejercicios de bajo impacto, por ejemplo:  Natacin.  Gimnasia acutica.  Andar en bicicleta.  Caminar. Cuidado de la articulacin  Si la articulacin se le hincha, mantngala elevada como se lo haya indicado el mdico.  Si al despertar por la maana, nota que la articulacin est rgida, intente tomar una ducha con agua tibia.  Si se lo indican, pngase calor en la articulacin. Si es diabtico, no se aplique calor sin la autorizacin del mdico.  Coloque una toalla entre la articulacin y la compresa caliente o la almohadilla trmica.  Coloque el calor en la zona durante 20 o .  Si se lo indican, pngase hielo en la articulacin:  Ponga el hielo en una bolsa plstica.  Coloque una toalla entre la piel y la bolsa de hielo.  Coloque el hielo durante , 2 a 3veces por Futures trader.  Concurra a todas las visitas de control como se lo haya indicado el mdico. Esto es importante. SOLICITE ATENCIN MDICA SI:  El dolor empeora.  Tiene fiebre. SOLICITE ATENCIN MDICA DE INMEDIATO SI:  Siente  dolor, u observa hinchazn o enrojecimiento en la articulacin.  Siente dolor en muchas articulaciones y se le hinchan.  Siente un dolor intenso en la espalda.  Tiene mucha debilidad en la pierna.  No puede controlar los intestinos o la  vejiga.   Esta informacin no tiene Theme park managercomo fin reemplazar el consejo del mdico. Asegrese de hacerle al mdico cualquier pregunta que tenga.   Document Released: 12/05/2005 Document Revised: 08/26/2015 Elsevier Interactive Patient Education [employer]2016 Elsevier Inc.    IF you received an x-ray today, you will receive an invoice from [facility]. Please contact [facility] at [phone] with questions or concerns regarding your invoice.   IF you received labwork today, you will receive an invoice from [facility]. Please contact [facility] at [phone] with questions or concerns regarding your invoice.   Our billing staff will not be able to assist you with questions regarding bills from these companies.  You will be contacted with the lab results as soon as they are available. The fastest way to get your results is to activate your My Chart account. Instructions are located on the last page of this paperwork. If you have not heard from us regarding the results in 2 weeks, please contact this office.

## 2016-08-25 ENCOUNTER — Encounter: Payer: Self-pay | Admitting: Family Medicine

## 2016-08-26 ENCOUNTER — Encounter: Payer: Self-pay | Admitting: Family Medicine

## 2016-08-26 NOTE — Progress Notes (Signed)
LMTCB

## 2016-08-29 NOTE — Progress Notes (Signed)
lmtcb x2 

## 2016-08-30 ENCOUNTER — Telehealth: Payer: Self-pay | Admitting: Internal Medicine

## 2016-08-30 NOTE — Telephone Encounter (Signed)
Notes Recorded by Nyoka CowdenMichael B Wert, MD on 08/26/2016 at 1:32 PM EDT Call patient : Let him know I reviewed it and do not rec additional CTs just cxr yearly ------  Notes Recorded by Ethelda ChickKristi M Smith, MD on 08/26/2016 at 12:27 PM EDT Will forward results to Dr. Sherene SiresWert for review. ------  Notes Recorded by Ethelda ChickKristi M Smith, MD on 08/26/2016 at 12:26 PM EDT CT of chest shows: stable granulomas that are unchanged in size.  There is evidence of calcified plaques in both lungs that is consistent with previous asbestos disease/damage. (Letter)  ------------------------------ Called and spoke to pt's daughter, [name]. Informed her of the results and recs per Dr. Katrinka BlazingSmith and Dr. Sherene SiresWert. [name] verbalized understanding states she will relay the information and denied any further questions or concerns at this time.

## 2016-08-30 NOTE — Progress Notes (Signed)
Called and spoke to pt's daughter, [name]. Informed her of the results and recs per Dr. Katrinka BlazingSmith and Dr. Sherene SiresWert. [name] verbalized understanding states she will relay the information and denied any further questions or concerns at this time.

## 2016-08-31 DIAGNOSIS — M5010 Cervical disc disorder with radiculopathy, unspecified cervical region: Secondary | ICD-10-CM | POA: Diagnosis not present

## 2016-09-08 ENCOUNTER — Ambulatory Visit (INDEPENDENT_AMBULATORY_CARE_PROVIDER_SITE_OTHER): Payer: BLUE CROSS/BLUE SHIELD

## 2016-09-08 ENCOUNTER — Ambulatory Visit (INDEPENDENT_AMBULATORY_CARE_PROVIDER_SITE_OTHER): Payer: BLUE CROSS/BLUE SHIELD | Admitting: Physician Assistant

## 2016-09-08 VITALS — BP 108/66 | HR 114 | Temp 99.0°F | Resp 16 | Ht 65.5 in | Wt 208.2 lb

## 2016-09-08 DIAGNOSIS — R000 Tachycardia, unspecified: Secondary | ICD-10-CM | POA: Diagnosis not present

## 2016-09-08 DIAGNOSIS — D72829 Elevated white blood cell count, unspecified: Secondary | ICD-10-CM | POA: Diagnosis not present

## 2016-09-08 DIAGNOSIS — J069 Acute upper respiratory infection, unspecified: Secondary | ICD-10-CM | POA: Diagnosis not present

## 2016-09-08 DIAGNOSIS — J029 Acute pharyngitis, unspecified: Secondary | ICD-10-CM

## 2016-09-08 DIAGNOSIS — R059 Cough, unspecified: Secondary | ICD-10-CM

## 2016-09-08 DIAGNOSIS — R05 Cough: Secondary | ICD-10-CM | POA: Diagnosis not present

## 2016-09-08 LAB — POCT CBC
Granulocyte percent: 62.7 %G (ref 37–80)
HCT, POC: 45 % (ref 43.5–53.7)
HEMOGLOBIN: 16.2 g/dL (ref 14.1–18.1)
LYMPH, POC: 3 (ref 0.6–3.4)
MCH: 32.1 pg — AB (ref 27–31.2)
MCHC: 36 g/dL — AB (ref 31.8–35.4)
MCV: 89.1 fL (ref 80–97)
MID (cbc): 1.1 — AB (ref 0–0.9)
MPV: 8.2 fL (ref 0–99.8)
PLATELET COUNT, POC: 219 10*3/uL (ref 142–424)
POC Granulocyte: 6.9 (ref 2–6.9)
POC LYMPH PERCENT: 27.5 %L (ref 10–50)
POC MID %: 9.8 %M (ref 0–12)
RBC: 5.04 M/uL (ref 4.69–6.13)
RDW, POC: 12.2 %
WBC: 11 10*3/uL — AB (ref 4.6–10.2)

## 2016-09-08 LAB — POCT RAPID STREP A (OFFICE): Rapid Strep A Screen: NEGATIVE

## 2016-09-08 MED ORDER — HYDROCOD POLST-CPM POLST ER 10-8 MG/5ML PO SUER: 5.0000 mL | Freq: Two times a day (BID) | ORAL | 0 refills | Status: DC | PRN

## 2016-09-08 MED ORDER — BENZONATATE 100 MG PO CAPS: 100.0000 mg | ORAL_CAPSULE | Freq: Three times a day (TID) | ORAL | 0 refills | Status: DC | PRN

## 2016-09-08 MED ORDER — AZITHROMYCIN 250 MG PO TABS: ORAL_TABLET | ORAL | 0 refills | Status: DC

## 2016-09-08 NOTE — Progress Notes (Signed)
MRN: [id number] DOB: 05-12-1956  Subjective:   Lance Mason is a 60 y.o. male presenting for chief complaint of Cough (X 2 days) and Sore Throat .  Reports two day history of nasal congestion, rhinorrhea, sore throat and productive cough (without hemoptysis), night sweats, chills, fatigue, malaise and nausea. Has tried two pills of XL 3 (a [place] remedy) with mild relief. Denies fever, itchy watery eyes, red eyes, ear fullness, ear pain, ear drainage, wheezing, shortness of breath, chest tightness and chest pain, decreased appetite, weight loss, nausea, vomiting, abdominal pain and diarrhea. Has not had recent sick contact or recent travel. No history of seasonal allergies, no hstory of asthma. Patient does have history of asbestos related pulmonary disease, followed by pulmonology.  Patient has not had flu shot this season. Denies smoking or alcohol. Denies any other aggravating or relieving factors, no other questions or concerns.  Lance Mason has a current medication list which includes the following prescription(s): atorvastatin, cyclobenzaprine, glipizide, metformin, accu-chek aviva plus, accu-chek softclix lancets, benzonatate, blood glucose meter kit and supplies, accu-chek aviva plus, and gabapentin. Also has No Known Allergies.  Lance Mason  has a past medical history of Diabetes mellitus without complication ([place]) and Hypertension. Also  has no past surgical history on file.  Objective:   Vitals: BP 108/66 (BP Location: Right Arm, Patient Position: Sitting, Cuff Size: Large)   Pulse (!) 114   Temp 99 F (37.2 C) (Oral)   Resp 16   Ht 5' 5.5" (1.664 m)   Wt 208 lb 3.2 oz (94.4 kg)   SpO2 97%   BMI 34.12 kg/m   Physical Exam  Constitutional: He is oriented to person, place, and time. He appears well-developed and well-nourished.  HENT:  Head: Normocephalic and atraumatic.  Right Ear: Tympanic membrane, external ear and ear canal normal.  Left Ear: Tympanic membrane,  external ear and ear canal normal.  Nose: Mucosal edema and rhinorrhea present.  Mouth/Throat: Uvula is midline and mucous membranes are normal. Posterior oropharyngeal erythema present.  Eyes: Conjunctivae are normal.  Neck: Normal range of motion.  Cardiovascular: Regular rhythm and normal heart sounds.  Tachycardia present.   Pulmonary/Chest: Effort normal and breath sounds normal. He has no wheezes.  Lymphadenopathy:       Head (right side): No submental, no submandibular, no tonsillar, no preauricular, no posterior auricular and no occipital adenopathy present.       Head (left side): No submental, no submandibular, no tonsillar, no preauricular, no posterior auricular and no occipital adenopathy present.    He has no cervical adenopathy.       Right: No supraclavicular adenopathy present.       Left: No supraclavicular adenopathy present.  Neurological: He is alert and oriented to person, place, and time.  Skin: Skin is warm.  Psychiatric: He has a normal mood and affect.  Vitals reviewed.  Results for orders placed or performed in visit on 09/08/16 (from the past 24 hour(s))  POCT rapid strep A     Status: None   Collection Time: 09/08/16  3:47 PM  Result Value Ref Range   Rapid Strep A Screen Negative Negative  POCT CBC     Status: Abnormal   Collection Time: 09/08/16  4:53 PM  Result Value Ref Range   WBC 11.0 (A) 4.6 - 10.2 K/uL   Lymph, poc 3.0 0.6 - 3.4   POC LYMPH PERCENT 27.5 10 - 50 %L   MID (cbc) 1.1 (A) 0 - 0.9  POC MID % 9.8 0 - 12 %M   POC Granulocyte 6.9 2 - 6.9   Granulocyte percent 62.7 37 - 80 %G   RBC 5.04 4.69 - 6.13 M/uL   Hemoglobin 16.2 14.1 - 18.1 g/dL   HCT, POC 45.0 43.5 - 53.7 %   MCV 89.1 80 - 97 fL   MCH, POC 32.1 (A) 27 - 31.2 pg   MCHC 36.0 (A) 31.8 - 35.4 g/dL   RDW, POC 12.2 %   Platelet Count, POC 219 142 - 424 K/uL   MPV 8.2 0 - 99.8 fL   Pulse Readings from Last 3 Encounters:  09/08/16 (!) 114  08/17/16 (!) 101  07/22/16 (!) 105     Dg Chest 2 View  Result Date: 09/08/2016 CLINICAL DATA:  Cough.  Leukocytosis. EXAM: CHEST  2 VIEW COMPARISON:  07/22/2015 chest radiograph. FINDINGS: Stable cardiomediastinal silhouette with normal heart size. No pneumothorax. No pleural effusion. Stable calcified pleural plaques at both diaphragms. Stable smooth symmetric pleural thickening in the peripheral mid pleural spaces bilaterally. No pulmonary edema. No acute consolidative airspace disease. Mild thoracic spondylosis. IMPRESSION: 1. No active cardiopulmonary disease. 2. Stable findings of asbestos related pleural disease including calcified pleural plaques along the diaphragms and smooth symmetric pleural thickening in the peripheral mid pleural spaces. Electronically Signed   By: Ilona Sorrel M.D.   On: 09/08/2016 17:23   Assessment and Plan :  1. Sorethroat - POCT rapid strep A - Culture, Group A Strep  2. Tachycardia - POCT CBC  3. Leukocytosis - DG Chest 2 View; Future  4. Cough - benzonatate (TESSALON) 100 MG capsule; Take 1-2 capsules (100-200 mg total) by mouth 3 (three) times daily as needed for cough.  Dispense: 40 capsule; Refill: 0 - chlorpheniramine-HYDROcodone (TUSSIONEX PENNKINETIC ER) 10-8 MG/5ML SUER; Take 5 mLs by mouth every 12 (twelve) hours as needed for cough.  Dispense: 100 mL; Refill: 0  5. Acute upper respiratory infection -Due to patient's history,will cover for bacterial etiology - azithromycin (ZITHROMAX) 250 MG tablet; Take 2 tabs PO x 1 dose, then 1 tab PO QD x 4 days  Dispense: 6 tablet; Refill: 0  -Return to clinic if symptoms worsen, do not improve, or as needed  Tenna Delaine, PA-C  [facility] 09/08/2016 5:27 PM

## 2016-09-08 NOTE — Patient Instructions (Addendum)
Follow up with your pulmonlogist.  Use cough syrup at night for cough and tessalon perles during the day for cough.  Take antibiotic as prescribed.  -Return to clinic if symptoms worsen, do not improve, or as needed     IF you received an x-ray today, you will receive an invoice from [facility]. Please contact [facility] at [phone] with questions or concerns regarding your invoice.   IF you received labwork today, you will receive an invoice from [facility]. Please contact [facility] at [phone] with questions or concerns regarding your invoice.   Our billing staff will not be able to assist you with questions regarding bills from these companies.  You will be contacted with the lab results as soon as they are available. The fastest way to get your results is to activate your My Chart account. Instructions are located on the last page of this paperwork. If you have not heard from us regarding the results in 2 weeks, please contact this office.

## 2016-09-10 LAB — CULTURE, GROUP A STREP: Organism ID, Bacteria: NORMAL

## 2016-09-28 ENCOUNTER — Telehealth: Payer: Self-pay

## 2016-09-28 NOTE — Telephone Encounter (Signed)
Pt would like a script for blood glucose meter kit and supplies [[id number]].  Pharmacy:  [facility] [phone] - [place], [place]. Please advise if they need a OV-the grand daughter would like to know. [phone]

## 2016-09-29 NOTE — Telephone Encounter (Signed)
REVIEWED ACTIVE MEDICATIONS. PARAMETERS FOR REFILL MET PER DEPARTMENTAL REFILL PROTOCOL. REFILL SENT ELECTRONICALLY TO PHARMACY. ORDER FORWARDED TO PROVIDER TO CO-SIGN.  Rebekah Fabery, RN  last ov for DM 07/2016 and labs 06/2016, when would you like to see him again? Also I see an rx for a kit and supplies sent 05/2016 ? lmtcb to talk about kit and appointment.

## 2016-10-01 NOTE — Telephone Encounter (Signed)
Recommend follow-up this month for repeat HgbA1c and follow-up.  Please send in meter and test strips and lancets to check sugar once daily.

## 2016-10-03 NOTE — Telephone Encounter (Signed)
LMOM for pt that RFs were sent to pharm for DM supplies and need for f/up this month.

## 2016-11-01 ENCOUNTER — Ambulatory Visit: Payer: BLUE CROSS/BLUE SHIELD | Admitting: Family Medicine

## 2017-01-02 ENCOUNTER — Ambulatory Visit (INDEPENDENT_AMBULATORY_CARE_PROVIDER_SITE_OTHER): Payer: BLUE CROSS/BLUE SHIELD | Admitting: Family Medicine

## 2017-01-02 VITALS — BP 124/80 | HR 94 | Temp 98.2°F | Resp 18 | Ht 65.5 in | Wt 206.0 lb

## 2017-01-02 DIAGNOSIS — Z23 Encounter for immunization: Secondary | ICD-10-CM | POA: Diagnosis not present

## 2017-01-02 DIAGNOSIS — I10 Essential (primary) hypertension: Secondary | ICD-10-CM | POA: Diagnosis not present

## 2017-01-02 DIAGNOSIS — E7800 Pure hypercholesterolemia, unspecified: Secondary | ICD-10-CM | POA: Diagnosis not present

## 2017-01-02 DIAGNOSIS — R911 Solitary pulmonary nodule: Secondary | ICD-10-CM

## 2017-01-02 DIAGNOSIS — Z6836 Body mass index (BMI) 36.0-36.9, adult: Secondary | ICD-10-CM

## 2017-01-02 DIAGNOSIS — E119 Type 2 diabetes mellitus without complications: Secondary | ICD-10-CM

## 2017-01-02 DIAGNOSIS — E6609 Other obesity due to excess calories: Secondary | ICD-10-CM

## 2017-01-02 DIAGNOSIS — IMO0001 Reserved for inherently not codable concepts without codable children: Secondary | ICD-10-CM

## 2017-01-02 DIAGNOSIS — Z1159 Encounter for screening for other viral diseases: Secondary | ICD-10-CM | POA: Diagnosis not present

## 2017-01-02 DIAGNOSIS — Z114 Encounter for screening for human immunodeficiency virus [HIV]: Secondary | ICD-10-CM

## 2017-01-02 DIAGNOSIS — Z125 Encounter for screening for malignant neoplasm of prostate: Secondary | ICD-10-CM

## 2017-01-02 LAB — POCT GLYCOSYLATED HEMOGLOBIN (HGB A1C): HEMOGLOBIN A1C: 12.8

## 2017-01-02 LAB — GLUCOSE, POCT (MANUAL RESULT ENTRY)

## 2017-01-02 MED ORDER — GLIPIZIDE ER 5 MG PO TB24: 5.0000 mg | ORAL_TABLET | Freq: Two times a day (BID) | ORAL | 1 refills | Status: DC

## 2017-01-02 MED ORDER — SITAGLIPTIN PHOSPHATE 100 MG PO TABS: 100.0000 mg | ORAL_TABLET | Freq: Every day | ORAL | 1 refills | Status: DC

## 2017-01-02 MED ORDER — METFORMIN HCL 1000 MG PO TABS: 1000.0000 mg | ORAL_TABLET | Freq: Two times a day (BID) | ORAL | 1 refills | Status: DC

## 2017-01-02 MED ORDER — ACCU-CHEK AVIVA PLUS VI STRP: ORAL_STRIP | 7 refills | Status: DC

## 2017-01-02 MED ORDER — ATORVASTATIN CALCIUM 20 MG PO TABS: 20.0000 mg | ORAL_TABLET | Freq: Every day | ORAL | 1 refills | Status: DC

## 2017-01-02 NOTE — Patient Instructions (Addendum)
IF you received an x-ray today, you will receive an invoice from [facility]. Please contact [facility] at [phone] with questions or concerns regarding your invoice.   IF you received labwork today, you will receive an invoice from [facility]. Please contact [facility] at [phone] with questions or concerns regarding your invoice.   Our billing staff will not be able to assist you with questions regarding bills from these companies.  You will be contacted with the lab results as soon as they are available. The fastest way to get your results is to activate your My Chart account. Instructions are located on the last page of this paperwork. If you have not heard from us regarding the results in 2 weeks, please contact this office.     Recuento de carbohidratos para la diabetes mellitus en los adultos (Carbohydrate Counting for Diabetes Mellitus, Adult) El recuento de carbohidratos es un mtodo destinado a Midwifellevar un registro de la cantidad de carbohidratos que se ingieren. La ingesta natural de carbohidratos aumenta la cantidad de azcar (glucemia) en la sangre. El recuento de la cantidad de carbohidratos que se ingieren sirve para que el nivel sanguneo de glucosa permanezca dentro de los lmites normales, lo que ayuda a Pharmacologistmantener la diabetes (diabetes mellitus) bajo control. Es importante saber la cantidad de carbohidratos que se pueden ingerir en cada comida sin correr Surveyor, mineralsningn riesgo. Esto es Government social research officerdiferente en cada persona. Un especialista en dietas y alimentacin (nutricionista certificado) puede ayudarlo a crear un plan de alimentacin y a calcular la cantidad de carbohidratos que debe ingerir en cada comida y colacin. Los siguientes alimentos incluyen carbohidratos:  Granos, como los panes y los cereales.  Frijoles secos y productos con soja.  Vegetales almidonados, como papas, guisantes y maz.  Nils PyleFrutas y jugos de frutas.  Leche y Dentistyogur.  Dulces y colaciones,  como pasteles, galletas, caramelos, papas fritas y refrescos. CMO SE CALCULAN LOS CARBOHIDRATOS? Hay dos maneras de calcular los carbohidratos de los alimentos. Puede usar cualquiera de 1 Kamani Stlos dos mtodos o [place] combinacin de [place]urd[place]ambos. Leer la etiqueta de informacin nutricional de los alimentos envasados  La lista de informacin nutricional est incluida en las etiquetas de casi todas las bebidas y los alimentos envasados de los [place]. Incluye lo siguiente:  El tamao de la porcin.  Informacin sobre los nutrientes de cada porcin, incluidos los gramos (g) de carbohidratos por porcin. Para usar la informacin nutricional:  Decida cuntas porciones va a comer.  Multiplique la cantidad de porciones por el nmero de carbohidratos por porcin.  El resultado es la cantidad total de carbohidratos que comer. Conocer los tamaos de las porciones estndar de otros alimentos  Cuando coma alimentos que contienen carbohidratos que no estn envasados o no incluyen la informacin nutricional en la etiqueta, debe medir las porciones para poder calcular la cantidad de carbohidratos:  Mida los alimentos que comer con una balanza de alimentos o una taza medidora, si es necesario.  Decida cuntas porciones de Programmer, systemstamao estndar comer.  Multiplique el nmero de porciones por15. La mayora de los alimentos con alto contenido de carbohidratos contienen unos 15g de carbohidratos por porcin.  Por ejemplo, si come 8onzas (170g) de fresas, habr comido 2porciones y 30g de carbohidratos (2porciones x 15g=30g).  En el caso de las comidas que contienen mezclas de ms de un alimento, como las sopas y los guisos, debe calcular los carbohidratos de cada alimento que se incluye. La siguiente World Fuel Services Corporationlista incluye los tamaos de las porciones estndar de  los alimentos corrientes con alto contenido de carbohidratos. Cada una de estas porciones tiene aproximadamente 15g de carbohidratos:  pan de hamburguesa  o muffin ingls.  onza (15ml) de almbar.  onza (14g) de gelatina.  1rebanada de pan.  1tortilla de seispulgadas.  3onzas (85g) de arroz o pasta cocidos.  4onzas (113g) de porotos secos cocidos.  4onzas (113g) de verduras con almidn, como frijoles, maz o papas.  4onzas (113g) de cereal caliente.  4onzas (113g) de pur de papas o de una papa grande al horno.  4onzas (113g) de frutas en lata o congeladas.  4onzas ( ) de jugo de frutas.  4a 6galletas.  6croquetas de pollo.  6onzas (170g) de cereales secos sin azcar.  6onzas (170g) de yogur descremado sin ningn agregado o de yogur endulzado con edulcorante artificial.  8onzas ( ) de [place].  8onzas (170g) de frutas frescas o una fruta pequea.  24onzas (680g) de palomitas de maz. EJEMPLO DE RECUENTO DE CARBOHIDRATOS Ejemplo de comida   3onzas (85g) de pechugas de pollo.  6onzas (170g) de arroz integral.  4onzas (113g) de maz.  8onzas ( ) de leche.  8onzas (170g) de fresas con crema batida sin azcar. Clculo de carbohidratos  1. Identifique los alimentos que contienen carbohidratos:  Arroz.  Maz.  Leche.  Jinny Sanders. 1. Calcule cuntas porciones come de cada alimento:  2 porciones de arroz.  1 racin de maz.  1 porcin de leche.  1 porcin de fresas. 1. Multiplique cada nmero de porciones por 15g:  2 porciones de arroz x 15 g = 30 g.  1 porcin de maz x 15 g = 15 g.  1 porcin de leche x 15 g = 15 g.  1 porcin de fresas x 15 g = 15 g. 1. Sume todas las cantidades para conocer el total de gramos de carbohidratos consumidos:  30g + 15g + 15g + 15g = 75g de carbohidratos en total. Esta informacin no tiene como fin reemplazar el consejo del mdico. Asegrese de hacerle al mdico cualquier pregunta que tenga. Document Released: 02/27/2012 Document Revised: 12/26/2014 Document Reviewed: 05/18/2016 Elsevier Interactive  Patient Education  2017 [employer].

## 2017-01-02 NOTE — Progress Notes (Signed)
Subjective:    Patient ID: Lance Mason, male    DOB: 1956/10/01, 61 y.o.   MRN: [id number]  01/02/2017  Follow-up (diabetes)   HPI This 61 y.o. male presents for evaluation of DMII, hypercholesterolemia, hypertension, lung nodule.  Last visit July 2017. Patient reports good compliance with medication, good tolerance to medication, and good symptom control.   Sugar never less than 230.   Metformin 1000mg  bid. Glipizide 5mg  once daily only.  Gabapentin 300mg  tid---not taking. Atorvastatin 10mg  daily.  ASA not daily. Sugar 240 this morning.  B: cup of milk with chocolate, bread one large roll Lunch: tortillas x 5, beans, spicy salsa, fried crunchy skin pig.   [place]  No pneumovax.   Immunization History  Administered Date(s) Administered  . Pneumococcal Polysaccharide-23 01/02/2017   BP Readings from Last 3 Encounters:  01/02/17 124/80  09/08/16 108/66  08/17/16 124/84    Wt Readings from Last 3 Encounters:  01/02/17 206 lb (93.4 kg)  09/08/16 208 lb 3.2 oz (94.4 kg)  08/17/16 207 lb (93.9 kg)     Review of Systems  Constitutional: Negative for activity change, appetite change, chills, diaphoresis, fatigue and fever.  Respiratory: Negative for cough and shortness of breath.   Cardiovascular: Negative for chest pain, palpitations and leg swelling.  Gastrointestinal: Negative for abdominal pain, diarrhea, nausea and vomiting.  Endocrine: Negative for cold intolerance, heat intolerance, polydipsia, polyphagia and polyuria.  Skin: Negative for color change, rash and wound.  Neurological: Negative for dizziness, tremors, seizures, syncope, facial asymmetry, speech difficulty, weakness, light-headedness, numbness and headaches.  Psychiatric/Behavioral: Negative for dysphoric mood and sleep disturbance. The patient is not nervous/anxious.     Past Medical History:  Diagnosis Date  . Diabetes mellitus without complication (HCC)   .  Hypertension    History reviewed. No pertinent surgical history. No Known Allergies  Social History   Social History  . Marital status: Married    Spouse name: N/A  . Number of children: 7  . Years of education: N/A   Occupational History  .      painting   Social History Main Topics  . Smoking status: Former Smoker    Packs/day: 0.50    Years: 5.00    Types: Cigarettes    Quit date: 12/19/1994  . Smokeless tobacco: Never Used  . Alcohol use 0.0 oz/week     Comment: occasionally beer  . Drug use: No  . Sexual activity: Not on file   Other Topics Concern  . Not on file   Social History Narrative   Marital status: married      Children: seven children      Employment: Education administratorpainter      Tobacco: quit smoking      Alcohol: none   Family History  Problem Relation Age of Onset  . Heart disease Mother     pacemaker/arrhythmia.  . Hypertension Mother   . Heart disease Father        Objective:    BP 124/80 (BP Location: Right Arm, Patient Position: Sitting, Cuff Size: Small)   Pulse 94   Temp 98.2 F (36.8 C) (Oral)   Resp 18   Ht 5' 5.5" (1.664 m)   Wt 206 lb (93.4 kg)   SpO2 97%   BMI 33.76 kg/m  Physical Exam  Constitutional: He is oriented to person, place, and time. He appears well-developed and well-nourished. No distress.  HENT:  Head: Normocephalic and atraumatic.  Right  Ear: External ear normal.  Left Ear: External ear normal.  Nose: Nose normal.  Mouth/Throat: Oropharynx is clear and moist.  Eyes: Conjunctivae and EOM are normal. Pupils are equal, round, and reactive to light.  Neck: Normal range of motion. Neck supple. Carotid bruit is not present. No thyromegaly present.  Cardiovascular: Normal rate, regular rhythm, normal heart sounds and intact distal pulses.  Exam reveals no gallop and no friction rub.   No murmur heard. Pulmonary/Chest: Effort normal and breath sounds normal. He has no wheezes. He has no rales.  Abdominal: Soft. Bowel sounds  are normal. He exhibits no distension and no mass. There is no tenderness. There is no rebound and no guarding.  Lymphadenopathy:    He has no cervical adenopathy.  Neurological: He is alert and oriented to person, place, and time. No cranial nerve deficit.  Skin: Skin is warm and dry. No rash noted. He is not diaphoretic.  Psychiatric: He has a normal mood and affect. His behavior is normal.  Nursing note and vitals reviewed.  Results for orders placed or performed in visit on 01/02/17  Comprehensive metabolic panel  Result Value Ref Range   Glucose 454 (H) 65 - 99 mg/dL   BUN 20 8 - 27 mg/dL   Creatinine, Ser 4.09 0.76 - 1.27 mg/dL   GFR calc non Af Amer 99 >59 mL/min/1.73   GFR calc Af Amer 114 >59 mL/min/1.73   BUN/Creatinine Ratio 26 (H) 10 - 24   Sodium 136 134 - 144 mmol/L   Potassium 4.9 3.5 - 5.2 mmol/L   Chloride 94 (L) 96 - 106 mmol/L   CO2 20 18 - 29 mmol/L   Calcium 9.4 8.6 - 10.2 mg/dL   Total Protein 7.2 6.0 - 8.5 g/dL   Albumin 4.2 3.6 - 4.8 g/dL   Globulin, Total 3.0 1.5 - 4.5 g/dL   Albumin/Globulin Ratio 1.4 1.2 - 2.2   Bilirubin Total 0.3 0.0 - 1.2 mg/dL   Alkaline Phosphatase 97 39 - 117 IU/L   AST 17 0 - 40 IU/L   ALT 23 0 - 44 IU/L  CBC with Differential/Platelet  Result Value Ref Range   WBC 9.4 3.4 - 10.8 x10E3/uL   RBC 5.11 4.14 - 5.80 x10E6/uL   Hemoglobin 15.8 13.0 - 17.7 g/dL   Hematocrit 81.1 91.4 - 51.0 %   MCV 92 79 - 97 fL   MCH 30.9 26.6 - 33.0 pg   MCHC 33.5 31.5 - 35.7 g/dL   RDW 78.2 95.6 - 21.3 %   Platelets 234 150 - 379 x10E3/uL   Neutrophils 65 Not Estab. %   Lymphs 24 Not Estab. %   Monocytes 6 Not Estab. %   Eos 4 Not Estab. %   Basos 0 Not Estab. %   Neutrophils Absolute 6.1 1.4 - 7.0 x10E3/uL   Lymphocytes Absolute 2.2 0.7 - 3.1 x10E3/uL   Monocytes Absolute 0.6 0.1 - 0.9 x10E3/uL   EOS (ABSOLUTE) 0.4 0.0 - 0.4 x10E3/uL   Basophils Absolute 0.0 0.0 - 0.2 x10E3/uL   Immature Granulocytes 1 Not Estab. %   Immature Grans  (Abs) 0.1 0.0 - 0.1 x10E3/uL  HIV antibody  Result Value Ref Range   HIV Screen 4th Generation wRfx Non Reactive Non Reactive  Lipid panel  Result Value Ref Range   Cholesterol, Total 182 100 - 199 mg/dL   Triglycerides 086 (H) 0 - 149 mg/dL   HDL 38 (L) >57 mg/dL   VLDL Cholesterol Cal Comment  5 - 40 mg/dL   LDL Calculated Comment 0 - 99 mg/dL   Chol/HDL Ratio 4.8 0.0 - 5.0 ratio units  Hepatitis C antibody  Result Value Ref Range   Hep C Virus Ab <0.1 0.0 - 0.9 s/co ratio  PSA  Result Value Ref Range   Prostate Specific Ag, Serum 0.5 0.0 - 4.0 ng/mL  POCT glucose (manual entry)  Result Value Ref Range   POC Glucose HHH 70 - 99 mg/dl  POCT glycosylated hemoglobin (Hb A1C)  Result Value Ref Range   Hemoglobin A1C 12.8    Depression screen [facility] 2/9 01/02/2017 09/08/2016 08/17/2016 07/22/2016 06/29/2016  Decreased Interest 0 0 0 0 0  Down, Depressed, Hopeless 0 0 0 0 0  PHQ - 2 Score 0 0 0 0 0       Assessment & Plan:   1. Type 2 diabetes mellitus without complication, without long-term current use of insulin (HCC)   2. Essential hypertension   3. Pure hypercholesterolemia   4. Lung nodule   5. Encounter for hepatitis C screening test for low risk patient   6. Screening for HIV (human immunodeficiency virus)   7. Screening for prostate cancer   8. Need for prophylactic vaccination against Streptococcus pneumoniae (pneumococcus)   9. Class 2 obesity due to excess calories with serious comorbidity and body mass index (BMI) of 36.0 to 36.9 in adult    -uncontrolled; add Januvia -emphasized the importance of weight loss, exercise, and low-carbohydrate low-sugar food choices. -s/p Pneumovax in office. -increase Glucotrol to bid as originally prescribed.  Orders Placed This Encounter  Procedures  . Pneumococcal polysaccharide vaccine 23-valent greater than or equal to 2yo subcutaneous/IM  . Comprehensive metabolic panel    Order Specific Question:   Has the patient fasted?     Answer:   Yes  . CBC with Differential/Platelet  . HIV antibody  . Lipid panel    Order Specific Question:   Has the patient fasted?    Answer:   Yes  . Hepatitis C antibody  . PSA  . POCT glucose (manual entry)  . POCT glycosylated hemoglobin (Hb A1C)  . HM Diabetes Foot Exam   Meds ordered this encounter  Medications  . glipiZIDE (GLUCOTROL XL) 5 MG 24 hr tablet    Sig: Take 1 tablet (5 mg total) by mouth 2 (two) times daily with a meal.    Dispense:  180 tablet    Refill:  1  . metFORMIN (GLUCOPHAGE) 1000 MG tablet    Sig: Take 1 tablet (1,000 mg total) by mouth 2 (two) times daily with a meal.    Dispense:  180 tablet    Refill:  1  . sitaGLIPtin (JANUVIA) 100 MG tablet    Sig: Take 1 tablet (100 mg total) by mouth daily.    Dispense:  90 tablet    Refill:  1  . atorvastatin (LIPITOR) 20 MG tablet    Sig: Take 1 tablet (20 mg total) by mouth daily.    Dispense:  90 tablet    Refill:  1  . ACCU-CHEK AVIVA PLUS test strip    Sig: Check sugar once daily; spanish label.    Dispense:  100 each    Refill:  7    Return in about 3 months (around 04/02/2017) for recheck.   Mandisa Persinger Paulita Fujita, M.D. [facility] [address] [place], [place]  [address] [phone] phone [phone] fax

## 2017-01-03 LAB — HEPATITIS C ANTIBODY

## 2017-01-03 LAB — COMPREHENSIVE METABOLIC PANEL
A/G RATIO: 1.4 (ref 1.2–2.2)
ALK PHOS: 97 IU/L (ref 39–117)
ALT: 23 IU/L (ref 0–44)
AST: 17 IU/L (ref 0–40)
Albumin: 4.2 g/dL (ref 3.6–4.8)
BUN / CREAT RATIO: 26 — AB (ref 10–24)
BUN: 20 mg/dL (ref 8–27)
Bilirubin Total: 0.3 mg/dL (ref 0.0–1.2)
CO2: 20 mmol/L (ref 18–29)
Calcium: 9.4 mg/dL (ref 8.6–10.2)
Chloride: 94 mmol/L — ABNORMAL LOW (ref 96–106)
Creatinine, Ser: 0.77 mg/dL (ref 0.76–1.27)
GFR calc Af Amer: 114 mL/min/{1.73_m2} (ref >59)
GFR, EST NON AFRICAN AMERICAN: 99 mL/min/{1.73_m2} (ref >59)
GLOBULIN, TOTAL: 3 g/dL (ref 1.5–4.5)
Glucose: 454 mg/dL — ABNORMAL HIGH (ref 65–99)
POTASSIUM: 4.9 mmol/L (ref 3.5–5.2)
SODIUM: 136 mmol/L (ref 134–144)
Total Protein: 7.2 g/dL (ref 6.0–8.5)

## 2017-01-03 LAB — HIV ANTIBODY (ROUTINE TESTING W REFLEX): HIV Screen 4th Generation wRfx: NONREACTIVE

## 2017-01-03 LAB — LIPID PANEL
CHOL/HDL RATIO: 4.8 ratio (ref 0.0–5.0)
Cholesterol, Total: 182 mg/dL (ref 100–199)
HDL: 38 mg/dL — ABNORMAL LOW (ref >39)
TRIGLYCERIDES: 406 mg/dL — AB (ref 0–149)

## 2017-01-03 LAB — CBC WITH DIFFERENTIAL/PLATELET
BASOS: 0 %
Basophils Absolute: 0 10*3/uL (ref 0.0–0.2)
EOS (ABSOLUTE): 0.4 10*3/uL (ref 0.0–0.4)
Eos: 4 %
Hematocrit: 47.2 % (ref 37.5–51.0)
Hemoglobin: 15.8 g/dL (ref 13.0–17.7)
Immature Grans (Abs): 0.1 10*3/uL (ref 0.0–0.1)
Immature Granulocytes: 1 %
LYMPHS ABS: 2.2 10*3/uL (ref 0.7–3.1)
Lymphs: 24 %
MCH: 30.9 pg (ref 26.6–33.0)
MCHC: 33.5 g/dL (ref 31.5–35.7)
MCV: 92 fL (ref 79–97)
MONOS ABS: 0.6 10*3/uL (ref 0.1–0.9)
Monocytes: 6 %
NEUTROS PCT: 65 %
Neutrophils Absolute: 6.1 10*3/uL (ref 1.4–7.0)
PLATELETS: 234 10*3/uL (ref 150–379)
RBC: 5.11 x10E6/uL (ref 4.14–5.80)
RDW: 13.6 % (ref 12.3–15.4)
WBC: 9.4 10*3/uL (ref 3.4–10.8)

## 2017-01-03 LAB — PSA: PROSTATE SPECIFIC AG, SERUM: 0.5 ng/mL (ref 0.0–4.0)

## 2017-05-23 ENCOUNTER — Ambulatory Visit: Payer: BLUE CROSS/BLUE SHIELD | Admitting: Family Medicine

## 2017-05-26 ENCOUNTER — Ambulatory Visit: Payer: BLUE CROSS/BLUE SHIELD | Admitting: Family Medicine

## 2017-09-10 ENCOUNTER — Other Ambulatory Visit: Payer: Self-pay | Admitting: Family Medicine

## 2017-11-23 ENCOUNTER — Other Ambulatory Visit: Payer: Self-pay | Admitting: Family Medicine

## 2017-11-24 NOTE — Telephone Encounter (Signed)
Rx for Lipitor however he has not followed up on his visits.   Last OV was 01/02/17.  He cancelled on 05/23/17;  And was a no show on 05/26/17. I did not approve this refill

## 2018-05-15 ENCOUNTER — Encounter: Payer: Self-pay | Admitting: Family Medicine

## 2018-05-18 DIAGNOSIS — E119 Type 2 diabetes mellitus without complications: Secondary | ICD-10-CM | POA: Diagnosis not present

## 2018-05-18 DIAGNOSIS — H40033 Anatomical narrow angle, bilateral: Secondary | ICD-10-CM | POA: Diagnosis not present

## 2018-06-13 NOTE — Progress Notes (Signed)
Subjective:    Patient ID: Lance Mason, male    DOB: 1956-07-19, 62 y.o.   MRN: [id number]  06/19/2018  Diabetes (follow )    HPI This 62 y.o. male presents for eighteen month follow-up of diabetes, hypercholesterolemia.  Added Januvia at last visit. Checking sugars; sugars running 160-170.  Yesterday 200s.  Ate some cake. Metformin twice daily. Takes Glipizide XL 62m once daily.  [place] pill; not sure if for blood pressure. Once daily. [place] 50[place]50[place] Has gained weight since last visit.  Not exercising. Having GERD; taking OTC Omeprazole.  Due for repeat chest xray due to pleural thickening; s/p pulmonology consultation who recommended yearly CXR.  S/p CT chest in 2017. IMPRESSION: Stable punctate tiny calcified granulomata bilaterally all measure 4 mm or less in size. Stable calcified pleural plaques bilaterally compatible with asbestos related pleural disease. No acute chest process or interval change.   BP Readings from Last 3 Encounters:  06/19/18 104/64  01/02/17 124/80  09/08/16 108/66   Wt Readings from Last 3 Encounters:  06/19/18 221 lb 12.8 oz (100.6 kg)  01/02/17 206 lb (93.4 kg)  09/08/16 208 lb 3.2 oz (94.4 kg)   Immunization History  Administered Date(s) Administered  . Pneumococcal Polysaccharide-23 01/02/2017    Review of Systems  Constitutional: Negative for activity change, appetite change, chills, diaphoresis, fatigue, fever and unexpected weight change.  HENT: Negative for congestion, dental problem, drooling, ear discharge, ear pain, facial swelling, hearing loss, mouth sores, nosebleeds, postnasal drip, rhinorrhea, sinus pressure, sneezing, sore throat, tinnitus, trouble swallowing and voice change.   Eyes: Negative for photophobia, pain, discharge, redness, itching and visual disturbance.  Respiratory: Negative for apnea, cough, choking, chest tightness, shortness of breath, wheezing  and stridor.   Cardiovascular: Negative for chest pain, palpitations and leg swelling.  Gastrointestinal: Negative for abdominal pain, blood in stool, constipation, diarrhea, nausea and vomiting.  Endocrine: Negative for cold intolerance, heat intolerance, polydipsia, polyphagia and polyuria.  Genitourinary: Negative for decreased urine volume, difficulty urinating, discharge, dysuria, enuresis, flank pain, frequency, genital sores, hematuria, penile pain, penile swelling, scrotal swelling, testicular pain and urgency.  Musculoskeletal: Negative for arthralgias, back pain, gait problem, joint swelling, myalgias, neck pain and neck stiffness.  Skin: Negative for color change, pallor, rash and wound.  Allergic/Immunologic: Negative for environmental allergies, food allergies and immunocompromised state.  Neurological: Negative for dizziness, tremors, seizures, syncope, facial asymmetry, speech difficulty, weakness, light-headedness, numbness and headaches.  Hematological: Negative for adenopathy. Does not bruise/bleed easily.  Psychiatric/Behavioral: Negative for agitation, behavioral problems, confusion, decreased concentration, dysphoric mood, hallucinations, self-injury, sleep disturbance and suicidal ideas. The patient is not nervous/anxious and is not hyperactive.     Past Medical History:  Diagnosis Date  . Diabetes mellitus without complication ([place]   . Hypertension    History reviewed. No pertinent surgical history. No Known Allergies Current Outpatient Medications on File Prior to Visit  Medication Sig Dispense Refill  . blood glucose meter kit and supplies Dispense based on patient and insurance preference. Use up to four times daily as directed. (FOR ICD-9 250.00, 250.01). 1 each 0  . Blood Glucose Monitoring Suppl (ACCU-CHEK AVIVA PLUS) w/Device KIT     . chlorpheniramine-HYDROcodone (TUSSIONEX PENNKINETIC ER) 10-8 MG/5ML SUER Take 5 mLs by mouth every 12 (twelve) hours as needed  for cough. (Patient not taking: Reported on 06/19/2018) 100 mL 0  . cyclobenzaprine (FLEXERIL) 5 MG tablet Take 1 tablet (5 mg total)  by mouth 3 (three) times daily as needed for muscle spasms. (Patient not taking: Reported on 06/19/2018) 60 tablet 1  . gabapentin (NEURONTIN) 300 MG capsule Take 1 capsule (300 mg total) by mouth 3 (three) times daily. (Patient not taking: Reported on 06/19/2018) 90 capsule 3   No current facility-administered medications on file prior to visit.    Social History   Socioeconomic History  . Marital status: Married    Spouse name: Not on file  . Number of children: 7  . Years of education: Not on file  . Highest education level: Not on file  Occupational History    Comment: painting  Social Needs  . Financial resource strain: Not on file  . Food insecurity:    Worry: Not on file    Inability: Not on file  . Transportation needs:    Medical: Not on file    Non-medical: Not on file  Tobacco Use  . Smoking status: Former Smoker    Packs/day: 0.50    Years: 5.00    Pack years: 2.50    Types: Cigarettes    Last attempt to quit: 12/19/1994    Years since quitting: 23.5  . Smokeless tobacco: Never Used  Substance and Sexual Activity  . Alcohol use: Yes    Alcohol/week: 0.0 oz    Comment: occasionally beer  . Drug use: No  . Sexual activity: Not on file  Lifestyle  . Physical activity:    Days per week: Not on file    Minutes per session: Not on file  . Stress: Not on file  Relationships  . Social connections:    Talks on phone: Not on file    Gets together: Not on file    Attends religious service: Not on file    Active member of club or organization: Not on file    Attends meetings of clubs or organizations: Not on file    Relationship status: Not on file  . Intimate partner violence:    Fear of current or ex partner: Not on file    Emotionally abused: Not on file    Physically abused: Not on file    Forced sexual activity: Not on file  Other  Topics Concern  . Not on file  Social History Narrative   Marital status: married      Children: seven children      Employment: Curator previously; working at [employer] in 2019.      Tobacco: quit smoking; non in 2019.      Alcohol: beer once per week.      Exercise: none   Family History  Problem Relation Age of Onset  . Heart disease Mother        pacemaker/arrhythmia.  . Hypertension Mother   . Heart disease Father        Objective:    BP 104/64   Pulse (!) 105   Temp 99 F (37.2 C)   Resp 16   Ht 5' 5.5" (1.664 m)   Wt 221 lb 12.8 oz (100.6 kg)   SpO2 98%   BMI 36.35 kg/m  Physical Exam  Constitutional: He is oriented to person, place, and time. He appears well-developed and well-nourished. No distress.  HENT:  Head: Normocephalic and atraumatic.  Right Ear: External ear normal.  Left Ear: External ear normal.  Nose: Nose normal.  Mouth/Throat: Oropharynx is clear and moist.  Eyes: Pupils are equal, round, and reactive to light. Conjunctivae and EOM are normal.  Neck: Normal range of motion. Neck supple. Carotid bruit is not present. No thyromegaly present.  Cardiovascular: Normal rate, regular rhythm, normal heart sounds and intact distal pulses. Exam reveals no gallop and no friction rub.  No murmur heard. Pulmonary/Chest: Effort normal and breath sounds normal. He has no wheezes. He has no rales.  Abdominal: Soft. Bowel sounds are normal. He exhibits no distension and no mass. There is no tenderness. There is no rebound and no guarding.  Lymphadenopathy:    He has no cervical adenopathy.  Neurological: He is alert and oriented to person, place, and time. No cranial nerve deficit.  Skin: Skin is warm and dry. No rash noted. He is not diaphoretic.  Psychiatric: He has a normal mood and affect. His behavior is normal.  Nursing note and vitals reviewed.  No results found. Depression screen [facility] 2/9 06/19/2018 01/02/2017 09/08/2016 08/17/2016 07/22/2016    Decreased Interest 0 0 0 0 0  Down, Depressed, Hopeless 0 0 0 0 0  PHQ - 2 Score 0 0 0 0 0   Fall Risk  06/19/2018 01/02/2017 09/08/2016 08/17/2016 07/22/2016  Falls in the past year? No No No No No        Assessment & Plan:   1. Type 2 diabetes mellitus without complication, without long-term current use of insulin ([place])   2. Essential hypertension   3. Pure hypercholesterolemia   4. Screening for prostate cancer   5. Lung nodule   6. Pleural thickening   7. Class 2 severe obesity due to excess calories with serious comorbidity and body mass index (BMI) of 36.0 to 36.9 in adult [facility])     Uncontrolled diabetes mellitus type 2 due to noncompliance with diet and medications and medical follow-up.  Hemoglobin A1c has actually improved from 12.8 in 2018-9.1 today.  Refills provided including metformin and Glipizide.  May need to restart Januvia at next visit.  Encourage follow-up at minimum every 6 months.  With 50% of the time in [place].  Has a primary care provider in [place] as well.  Highly recommend weight loss, exercise, low carbohydrate food choices.  Hypercholesterolemia: Uncontrolled due to noncompliance with atorvastatin therapy.  Refill of atorvastatin provided.  Pleural thickening and lung nodule: Status post CT of chest in 2017.  Status post pulmonology consultation in 2017 as well.  Pulmonology recommended yearly chest x-rays.  Follow-up with pulmonology only if chest x-rays worsen or become more abnormal or if patient becomes symptomatic.  Asymptomatic currently.  Obesity: Recommend weight loss, exercise for 30-60 minutes five days per week; recommend 1800 kcal restriction per day with a minimum of 60 grams of protein per day.  Eat 3 meals per day. Do not skip meals. Consider having a protein shake as a meal replacement to aid with eliminating meal skipping. Look for products with <220 calories, <7 gm sugar, and 20-30 gm protein.  Eat breakfast within 2 hours of getting up.    Make  your plate non-starchy vegetables,  protein, and  carbohydrates at lunch and dinner.   Aim for at least 64 oz. of calorie-free beverages daily (water, Crystal Light, diet green tea, etc.). Eliminate any sugary beverages such as regular soda, sweet tea, or fruit juice.   Pay attention to hunger and fullness cues.  Stop eating once you feel satisfied; don't wait until you feel full, stuffed, or sick from eating.  Choose lean meats and low fat/fat free dairy products.  Choose foods high in fiber such as fruits, vegetables, and  whole grains (brown rice, whole wheat pasta, whole wheat bread, etc.).  Limit foods with added sugar to <7 gm per serving.  Always eat in the kitchen/dining room.  Never eat in the bedroom or in front of the TV.     Orders Placed This Encounter  Procedures  . DG Chest 2 View    Standing Status:   Future    Number of Occurrences:   1    Standing Expiration Date:   06/19/2019    Order Specific Question:   Reason for Exam (SYMPTOM  OR DIAGNOSIS REQUIRED)    Answer:   one year follow-up of abnormal chest xray    Order Specific Question:   Preferred imaging location?    Answer:   External  . CBC with Differential/Platelet  . Comprehensive metabolic panel    Order Specific Question:   Has the patient fasted?    Answer:   No  . Lipid panel    Order Specific Question:   Has the patient fasted?    Answer:   No  . TSH  . PSA  . Microalbumin, urine  . POCT urinalysis dipstick  . POCT glucose (manual entry)  . POCT glycosylated hemoglobin (Hb A1C)   Meds ordered this encounter  Medications  . atorvastatin (LIPITOR) 20 MG tablet    Sig: Take 1 tablet (20 mg total) by mouth daily.    Dispense:  90 tablet    Refill:  1  . ACCU-CHEK AVIVA PLUS test strip    Sig: Check sugar once daily; spanish label.    Dispense:  100 each    Refill:  7  . ACCU-CHEK SOFTCLIX LANCETS lancets    Sig: Check sugar once daily    Dispense:  100 each    Refill:  3  .  DISCONTD: glipiZIDE (GLUCOTROL XL) 10 MG 24 hr tablet    Sig: Take 1 tablet (10 mg total) by mouth 2 (two) times daily with a meal.    Dispense:  90 tablet    Refill:  1  . metFORMIN (GLUCOPHAGE) 1000 MG tablet    Sig: Take 1 tablet (1,000 mg total) by mouth 2 (two) times daily with a meal.    Dispense:  180 tablet    Refill:  1  . glipiZIDE (GLUCOTROL XL) 10 MG 24 hr tablet    Sig: Take 1 tablet (10 mg total) by mouth daily with breakfast.    Dispense:  90 tablet    Refill:  1    Return in about 6 months (around 12/20/2018) for follow-up chronic medical conditions [place].   Jatziri Goffredo Elayne Guerin, M.D. Primary Care at [facility] previously [facility] [place] [address] [place], [place]  [address] [phone] phone [phone] fax,s

## 2018-06-19 ENCOUNTER — Other Ambulatory Visit: Payer: Self-pay

## 2018-06-19 ENCOUNTER — Ambulatory Visit: Payer: BLUE CROSS/BLUE SHIELD | Admitting: Family Medicine

## 2018-06-19 ENCOUNTER — Ambulatory Visit (INDEPENDENT_AMBULATORY_CARE_PROVIDER_SITE_OTHER): Payer: BLUE CROSS/BLUE SHIELD

## 2018-06-19 ENCOUNTER — Encounter: Payer: Self-pay | Admitting: Family Medicine

## 2018-06-19 VITALS — BP 104/64 | HR 105 | Temp 99.0°F | Resp 16 | Ht 65.5 in | Wt 221.8 lb

## 2018-06-19 DIAGNOSIS — E7800 Pure hypercholesterolemia, unspecified: Secondary | ICD-10-CM

## 2018-06-19 DIAGNOSIS — R911 Solitary pulmonary nodule: Secondary | ICD-10-CM | POA: Diagnosis not present

## 2018-06-19 DIAGNOSIS — J929 Pleural plaque without asbestos: Secondary | ICD-10-CM | POA: Diagnosis not present

## 2018-06-19 DIAGNOSIS — Z125 Encounter for screening for malignant neoplasm of prostate: Secondary | ICD-10-CM

## 2018-06-19 DIAGNOSIS — I10 Essential (primary) hypertension: Secondary | ICD-10-CM | POA: Diagnosis not present

## 2018-06-19 DIAGNOSIS — Z6836 Body mass index (BMI) 36.0-36.9, adult: Secondary | ICD-10-CM

## 2018-06-19 DIAGNOSIS — E119 Type 2 diabetes mellitus without complications: Secondary | ICD-10-CM | POA: Diagnosis not present

## 2018-06-19 LAB — POCT URINALYSIS DIP (MANUAL ENTRY)
BILIRUBIN UA: NEGATIVE
Glucose, UA: >=1000 mg/dL — AB
Ketones, POC UA: NEGATIVE mg/dL
LEUKOCYTES UA: NEGATIVE
NITRITE UA: NEGATIVE
Protein Ur, POC: NEGATIVE mg/dL
RBC UA: NEGATIVE
Spec Grav, UA: 1.015 (ref 1.010–1.025)
UROBILINOGEN UA: 0.2 U/dL
pH, UA: 6.5 (ref 5.0–8.0)

## 2018-06-19 LAB — POCT GLYCOSYLATED HEMOGLOBIN (HGB A1C): HEMOGLOBIN A1C: 9.1 % — AB (ref 4.0–5.6)

## 2018-06-19 LAB — GLUCOSE, POCT (MANUAL RESULT ENTRY): POC GLUCOSE: 221 mg/dL — AB (ref 70–99)

## 2018-06-19 MED ORDER — ACCU-CHEK AVIVA PLUS VI STRP: ORAL_STRIP | 7 refills | Status: AC

## 2018-06-19 MED ORDER — ATORVASTATIN CALCIUM 20 MG PO TABS: 20.0000 mg | ORAL_TABLET | Freq: Every day | ORAL | 1 refills | Status: DC

## 2018-06-19 MED ORDER — METFORMIN HCL 1000 MG PO TABS: 1000.0000 mg | ORAL_TABLET | Freq: Two times a day (BID) | ORAL | 1 refills | Status: DC

## 2018-06-19 MED ORDER — ACCU-CHEK SOFTCLIX LANCETS MISC: 3 refills | Status: AC

## 2018-06-19 MED ORDER — GLIPIZIDE ER 10 MG PO TB24: 10.0000 mg | ORAL_TABLET | Freq: Two times a day (BID) | ORAL | 1 refills | Status: DC

## 2018-06-19 NOTE — Patient Instructions (Addendum)
IF you received an x-ray today, you will receive an invoice from [facility]. Please contact [facility] at [phone] with questions or concerns regarding your invoice.   IF you received labwork today, you will receive an invoice from [facility]. Please contact [facility] at [phone] with questions or concerns regarding your invoice.   Our billing staff will not be able to assist you with questions regarding bills from these companies.  You will be contacted with the lab results as soon as they are available. The fastest way to get your results is to activate your My Chart account. Instructions are located on the last page of this paperwork. If you have not heard from [place] regarding the results in 2 weeks, please contact this office.      O diabetes e o cuidado dos ps (Diabetes and Foot Care) O diabetes pode causar problemas a voc devido a um fornecimento ruim de sangue (circulao) para suas pernas e ps. Isso pode fazer a [employer] seus ps ficar [employer], Comptroller mais facilmente e cicatrizar mais lentamente. Sua pele pode ficar seca, descascar e rachar. Voc pode apresentar tambm danos aos nervos das pernas e dos ps, o que pode causar perda da sensao. Voc pode no notar pequenas leses nos ps que podem levar a infeces ou problemas mais graves. Cuidar dos seus ps  uma das coisas mais importantes que voc pode fazer por voc [employer]. INSTRUES PARA O TRATAMENTO DOMICILIAR  Sempre use calados, mesmo em casa. No ande descalo. Ps descalos so facilmente lesionados.  Verifique seus ps todos os dias em busca de bolhas, cortes e vermelhido. Caso no consiga ver as solas dos ps, use um espelho ou pea ajuda a algum.  Lave os ps com gua morna (no use gua quente) e sabo suave. Seque suavemente os ps e as reas entre os dedos at [employer]. No ponha os ps de molho, pois isso pode ressecar a pele.  Aplique uma loo hidratante ou  vaselina (que no contenha lcool e sem fragrncia) na pele dos ps e nas unhas secas e quebradias. No aplique loo entre os dedos dos ps.  Apare as Eulah Citizen dos ps de [place] que elas fiquem retas. No remexa sob as unhas nem nas cutculas. Faa polimento [address] com uma [place].  No corte calos nem tente remov-los com medicamentos.  Use meias limpas todos os dias. Certifique-se de que elas no estejam apertadas. No use meias que cheguem aos joelhos, pois elas podem diminuir o fluxo do sangue para as suas pernas,  Use calados com ajuste correto e com suficiente amortecimento. Para amaciar calados novos, use-os somente por algumas horas por dia. Isso evita que voc machuque os ps. Sempre olhe [employer] calados antes de cal-los para se certificar de que no haja objetos dentro deles.  No cruze as pernas. Cruzar as pernas pode diminuir o fluxo sanguneo para seus ps.  Caso encontre pequenos arranhes, cortes ou rupturas na pele dos ps, mantenha-os limpos e secos, assim como a pele ao redor. Pode-se limpar essas reas com sabo suave e gua. No limpe essas reas com gua oxigenada, lcool ou iodo.  Ao remover curativos adesivos, certifique-se de no danificar a pele ao redor deles.  Caso tenha alguma ferida, observe-a diversas vezes por dia para se certificar de que ela esteja cicatrizando.  No use almofadas eltricas nem bolsas de gua quente. Elas podem queimar a pele. Caso tenha perdido a sensao nos ps  ou nas pernas, voc pode no perceber que isso est acontecendo at ser tarde demais.  Certifique-se de que o seu profissional de sade faa um exame completo dos seus ps pelo menos uma vez por ano ou com maior frequncia caso voc tenha problemas nos ps. Informe imediatamente quaisquer cortes, lceras ou hematomas ao seu profissional de sade.  PROCURE UM MDICO SE:  Voc tiver feridas que no cicatrizam.  Tiver cortes ou rupturas na pele.  Tiver  unhas encravadas.  Notar vermelhido nas pernas ou nos ps.  Sentir queimao ou formigamento nas pernas ou nos ps.  Sentir dor ou cibras nas pernas ou nos ps.  Suas pernas ou ps ficarem dormentes.  Seus ps estiverem sempre frios.  PROCURE UM MDICO IMEDIATAMENTE SE:  Houver vermelhido, inchao ou dor crescentes na rea de um ferimento.  Houver linhas vermelhas subindo pelas pernas.  Houver pus saindo de ferimentos.  Voc desenvolver febre ou conforme determinado pelo seu profissional de sade.  Notar cheiro ruim oriundo de [place] lcera ou ferida.  Estas informaes no se destinam a substituir as recomendaes de seu mdico. No deixe de discutir quaisquer dvidas com seu mdico. Document Released: 12/05/2005 Document Revised: 03/28/2016 Document Reviewed: 05/14/2013 Elsevier Interactive Patient Education  2017 [employer]

## 2018-06-20 ENCOUNTER — Encounter: Payer: Self-pay | Admitting: Family Medicine

## 2018-06-20 LAB — COMPREHENSIVE METABOLIC PANEL
A/G RATIO: 1.5 (ref 1.2–2.2)
ALBUMIN: 4.3 g/dL (ref 3.6–4.8)
ALT: 26 IU/L (ref 0–44)
AST: 20 IU/L (ref 0–40)
Alkaline Phosphatase: 60 IU/L (ref 39–117)
BILIRUBIN TOTAL: 0.2 mg/dL (ref 0.0–1.2)
BUN / CREAT RATIO: 17 (ref 10–24)
BUN: 17 mg/dL (ref 8–27)
CALCIUM: 9.8 mg/dL (ref 8.6–10.2)
CHLORIDE: 99 mmol/L (ref 96–106)
CO2: 25 mmol/L (ref 20–29)
Creatinine, Ser: 1 mg/dL (ref 0.76–1.27)
GFR, EST AFRICAN AMERICAN: 93 mL/min/{1.73_m2} (ref >59)
GFR, EST NON AFRICAN AMERICAN: 80 mL/min/{1.73_m2} (ref >59)
Globulin, Total: 2.9 g/dL (ref 1.5–4.5)
Glucose: 246 mg/dL — ABNORMAL HIGH (ref 65–99)
POTASSIUM: 4.5 mmol/L (ref 3.5–5.2)
Sodium: 141 mmol/L (ref 134–144)
TOTAL PROTEIN: 7.2 g/dL (ref 6.0–8.5)

## 2018-06-20 LAB — CBC WITH DIFFERENTIAL/PLATELET
BASOS: 0 %
Basophils Absolute: 0 10*3/uL (ref 0.0–0.2)
EOS (ABSOLUTE): 0.4 10*3/uL (ref 0.0–0.4)
EOS: 6 %
HEMATOCRIT: 42.4 % (ref 37.5–51.0)
HEMOGLOBIN: 14.3 g/dL (ref 13.0–17.7)
IMMATURE GRANS (ABS): 0.1 10*3/uL (ref 0.0–0.1)
IMMATURE GRANULOCYTES: 1 %
LYMPHS: 44 %
Lymphocytes Absolute: 3.3 10*3/uL — ABNORMAL HIGH (ref 0.7–3.1)
MCH: 31 pg (ref 26.6–33.0)
MCHC: 33.7 g/dL (ref 31.5–35.7)
MCV: 92 fL (ref 79–97)
Monocytes Absolute: 0.6 10*3/uL (ref 0.1–0.9)
Monocytes: 8 %
NEUTROS ABS: 3 10*3/uL (ref 1.4–7.0)
NEUTROS PCT: 41 %
Platelets: 262 10*3/uL (ref 150–450)
RBC: 4.62 x10E6/uL (ref 4.14–5.80)
RDW: 14 % (ref 12.3–15.4)
WBC: 7.4 10*3/uL (ref 3.4–10.8)

## 2018-06-20 LAB — LIPID PANEL
CHOL/HDL RATIO: 5.9 ratio — AB (ref 0.0–5.0)
Cholesterol, Total: 219 mg/dL — ABNORMAL HIGH (ref 100–199)
HDL: 37 mg/dL — AB (ref >39)
LDL Calculated: 120 mg/dL — ABNORMAL HIGH (ref 0–99)
Triglycerides: 308 mg/dL — ABNORMAL HIGH (ref 0–149)
VLDL CHOLESTEROL CAL: 62 mg/dL — AB (ref 5–40)

## 2018-06-20 LAB — MICROALBUMIN, URINE: Microalbumin, Urine: 3 ug/mL

## 2018-06-20 LAB — PSA: Prostate Specific Ag, Serum: 0.5 ng/mL (ref 0.0–4.0)

## 2018-06-20 LAB — TSH: TSH: 3.79 u[IU]/mL (ref 0.450–4.500)

## 2018-06-23 ENCOUNTER — Encounter: Payer: Self-pay | Admitting: Family Medicine

## 2018-06-24 DIAGNOSIS — J929 Pleural plaque without asbestos: Secondary | ICD-10-CM | POA: Insufficient documentation

## 2018-06-24 MED ORDER — GLIPIZIDE ER 10 MG PO TB24: 10.0000 mg | ORAL_TABLET | Freq: Every day | ORAL | 1 refills | Status: DC

## 2018-07-19 ENCOUNTER — Ambulatory Visit (INDEPENDENT_AMBULATORY_CARE_PROVIDER_SITE_OTHER): Payer: BLUE CROSS/BLUE SHIELD | Admitting: Family Medicine

## 2018-07-19 ENCOUNTER — Other Ambulatory Visit: Payer: Self-pay

## 2018-07-19 ENCOUNTER — Encounter: Payer: Self-pay | Admitting: Family Medicine

## 2018-07-19 VITALS — BP 126/81 | HR 100 | Temp 98.3°F | Ht 65.5 in | Wt 222.4 lb

## 2018-07-19 DIAGNOSIS — E119 Type 2 diabetes mellitus without complications: Secondary | ICD-10-CM

## 2018-07-19 DIAGNOSIS — I10 Essential (primary) hypertension: Secondary | ICD-10-CM

## 2018-07-19 DIAGNOSIS — Z1211 Encounter for screening for malignant neoplasm of colon: Secondary | ICD-10-CM | POA: Diagnosis not present

## 2018-07-19 DIAGNOSIS — E7800 Pure hypercholesterolemia, unspecified: Secondary | ICD-10-CM | POA: Diagnosis not present

## 2018-07-19 DIAGNOSIS — J929 Pleural plaque without asbestos: Secondary | ICD-10-CM

## 2018-07-19 MED ORDER — METFORMIN HCL 1000 MG PO TABS: 1000.0000 mg | ORAL_TABLET | Freq: Two times a day (BID) | ORAL | 1 refills | Status: AC

## 2018-07-19 MED ORDER — GABAPENTIN 300 MG PO CAPS: 300.0000 mg | ORAL_CAPSULE | Freq: Three times a day (TID) | ORAL | 1 refills | Status: AC

## 2018-07-19 MED ORDER — ATORVASTATIN CALCIUM 20 MG PO TABS: 20.0000 mg | ORAL_TABLET | Freq: Every day | ORAL | 1 refills | Status: AC

## 2018-07-19 MED ORDER — GLIPIZIDE ER 10 MG PO TB24: 10.0000 mg | ORAL_TABLET | Freq: Every day | ORAL | 1 refills | Status: AC

## 2018-07-19 NOTE — Progress Notes (Signed)
8/1/201912:31 PM  [place] 1956/04/06, 62 y.o. male [id number]  Chief Complaint  Patient presents with  . Referral    Was told that he has cataracts a month ago, wants refarral for eye doctor    HPI:   Patient is a 62 y.o. male with past medical history significant for DM2, HTN, HLP, who presents today to establish care  Last visit with previous PCP in July 2019 Has not been taking glipizide for about past 2 months He has not been taking gabapentin either Plans to go back to [place] around [place], he comes and goes several times a year, he does have a PCP there as well Requesting referral to optho as was told by optometrist that he had cataracts and DM changes + blurry vision, lots of clear discharge, tearing + constant numbing sensation on both feet, in [place] would get an IM injection that would make sx go away.  Checks cbg daily. Today 206 fasting, trying to follow diet Previous PCP restarted atorvastatin after lipids done in July, LDL 120, TG 26   Son with several precancerous colonic polyps in his 64s Last tetanus vaccine in [place] a year ago  Lab Results  Component Value Date   HGBA1C 9.1 (A) 06/19/2018    Fall Risk  07/19/2018 06/19/2018 01/02/2017 09/08/2016 08/17/2016  Falls in the past year? No No No No No     Depression screen [facility] 2/9 07/19/2018 06/19/2018 01/02/2017  Decreased Interest 0 0 0  Down, Depressed, Hopeless 0 0 0  PHQ - 2 Score 0 0 0    No Known Allergies  Prior to Admission medications   Medication Sig Start Date End Date Taking? Authorizing Provider  ACCU-CHEK AVIVA PLUS test strip Check sugar once daily; spanish label. 06/19/18  Yes Wardell Honour, MD  ACCU-CHEK [facility] LANCETS lancets Check sugar once daily 06/19/18  Yes Wardell Honour, MD  atorvastatin (LIPITOR) 20 MG tablet Take 1 tablet (20 mg total) by mouth daily. 06/19/18  Yes Wardell Honour, MD  blood glucose meter kit and supplies Dispense based on patient and insurance preference.  Use up to four times daily as directed. (FOR ICD-9 250.00, 250.01). 05/31/16  Yes Pollina, Gwenyth Allegra, MD  Blood Glucose Monitoring Suppl (ACCU-CHEK AVIVA PLUS) w/Device KIT  05/31/16  Yes [provider]  metFORMIN (GLUCOPHAGE) 1000 MG tablet Take 1 tablet (1,000 mg total) by mouth 2 (two) times daily with a meal. 06/19/18  Yes Wardell Honour, MD    Past Medical History:  Diagnosis Date  . Cataract   . Diabetes mellitus without complication ([place])   . Hyperlipidemia   . Hypertension     History reviewed. No pertinent surgical history.  Social History   Tobacco Use  . Smoking status: Former Smoker    Packs/day: 0.50    Years: 5.00    Pack years: 2.50    Types: Cigarettes    Last attempt to quit: 12/19/1994    Years since quitting: 23.5  . Smokeless tobacco: Never Used  Substance Use Topics  . Alcohol use: Yes    Alcohol/week: 0.0 oz    Comment: occasionally beer    Family History  Problem Relation Age of Onset  . Heart disease Mother        pacemaker/arrhythmia.  . Hypertension Mother   . Heart disease Father     Review of Systems  Constitutional: Negative for chills and fever.  Respiratory: Negative for cough and shortness of breath.  Cardiovascular: Negative for chest pain, palpitations and leg swelling.  Gastrointestinal: Negative for abdominal pain, nausea and vomiting.     OBJECTIVE:  Blood pressure 126/81, pulse 100, temperature 98.3 F (36.8 C), temperature source Oral, height 5' 5.5" (1.664 m), weight 222 lb 6.4 oz (100.9 kg), SpO2 97 %. Body mass index is 36.45 kg/m.   Physical Exam  Constitutional: He is oriented to person, place, and time. He appears well-developed and well-nourished.  HENT:  Head: Normocephalic and atraumatic.  Mouth/Throat: Oropharynx is clear and moist.  Eyes: Pupils are equal, round, and reactive to light. EOM are normal.  Neck: Neck supple.  Cardiovascular: Normal rate and regular rhythm. Exam reveals no gallop and  no friction rub.  No murmur heard. Pulmonary/Chest: Effort normal and breath sounds normal. He has no wheezes. He has no rales.  Musculoskeletal: He exhibits no edema.  Neurological: He is alert and oriented to person, place, and time.  Skin: Skin is warm and dry.  Psychiatric: He has a normal mood and affect.  Nursing note and vitals reviewed.      Diabetic Foot Exam - Simple   Simple Foot Form Visual Inspection No deformities, no ulcerations, no other skin breakdown bilaterally:  Yes Sensation Testing Intact to touch and monofilament testing bilaterally:  Yes Pulse Check Comments Felt very little sensation with the filament on both feet,      ASSESSMENT and PLAN  1. Type 2 diabetes mellitus without complication, without long-term current use of insulin ([facility]) Uncontrolled but improved, off glipizide for over 2 months. Refilling meds today. Referral to ophtho.  - Ambulatory referral to Ophthalmology - [facility] DIABETES FOOT EXAM - gabapentin (NEURONTIN) 300 MG capsule; Take 1 capsule (300 mg total) by mouth 3 (three) times daily.  2. Essential hypertension Controlled off meds. Last urine micro normal, ACE not indicated  3. Pure hypercholesterolemia Restarted statin, recheck lipids at next visit.   4. Colon cancer screening - Ambulatory referral to Gastroenterology  5. Pleural thickening CXR stable  Other orders - atorvastatin (LIPITOR) 20 MG tablet; Take 1 tablet (20 mg total) by mouth daily. - glipiZIDE (GLUCOTROL XL) 10 MG 24 hr tablet; Take 1 tablet (10 mg total) by mouth daily with breakfast. - metFORMIN (GLUCOPHAGE) 1000 MG tablet; Take 1 tablet (1,000 mg total) by mouth 2 (two) times daily with a meal.  Return in about 3 months (around 10/19/2018).    Rutherford Guys, MD Primary Care at [facility] [place], [place] [address] Ph.  [phone] Fax [phone]

## 2018-07-19 NOTE — Patient Instructions (Signed)
     IF you received an x-ray today, you will receive an invoice from [facility]. Please contact [facility] at [phone] with questions or concerns regarding your invoice.   IF you received labwork today, you will receive an invoice from [facility]. Please contact [facility] at [phone] with questions or concerns regarding your invoice.   Our billing staff will not be able to assist you with questions regarding bills from these companies.  You will be contacted with the lab results as soon as they are available. The fastest way to get your results is to activate your My Chart account. Instructions are located on the last page of this paperwork. If you have not heard from us regarding the results in 2 weeks, please contact this office.     

## 2018-09-21 ENCOUNTER — Encounter: Payer: Self-pay | Admitting: Family Medicine

## 2019-05-08 ENCOUNTER — Ambulatory Visit (HOSPITAL_COMMUNITY)
Admission: EM | Admit: 2019-05-08 | Discharge: 2019-05-08 | Disposition: A | Discharge status: Home / Self Care | Payer: BLUE CROSS/BLUE SHIELD | Source: Home / Self Care | Attending: Family Medicine | Admitting: Family Medicine

## 2019-05-08 ENCOUNTER — Other Ambulatory Visit: Payer: Self-pay

## 2019-05-08 ENCOUNTER — Encounter (HOSPITAL_COMMUNITY): Payer: Self-pay | Admitting: Emergency Medicine

## 2019-05-08 ENCOUNTER — Encounter (HOSPITAL_COMMUNITY): Payer: Self-pay

## 2019-05-08 ENCOUNTER — Emergency Department (HOSPITAL_COMMUNITY): Payer: BLUE CROSS/BLUE SHIELD

## 2019-05-08 ENCOUNTER — Emergency Department (HOSPITAL_COMMUNITY)
Admission: EM | Admit: 2019-05-08 | Discharge: 2019-05-08 | Disposition: A | Discharge status: Home / Self Care | Payer: BLUE CROSS/BLUE SHIELD | Attending: Emergency Medicine | Admitting: Emergency Medicine

## 2019-05-08 DIAGNOSIS — R55 Syncope and collapse: Secondary | ICD-10-CM | POA: Insufficient documentation

## 2019-05-08 DIAGNOSIS — Z20822 Contact with and (suspected) exposure to covid-19: Secondary | ICD-10-CM

## 2019-05-08 DIAGNOSIS — Z87891 Personal history of nicotine dependence: Secondary | ICD-10-CM | POA: Insufficient documentation

## 2019-05-08 DIAGNOSIS — I10 Essential (primary) hypertension: Secondary | ICD-10-CM | POA: Diagnosis not present

## 2019-05-08 DIAGNOSIS — B9789 Other viral agents as the cause of diseases classified elsewhere: Secondary | ICD-10-CM | POA: Diagnosis not present

## 2019-05-08 DIAGNOSIS — R509 Fever, unspecified: Secondary | ICD-10-CM | POA: Diagnosis not present

## 2019-05-08 DIAGNOSIS — E119 Type 2 diabetes mellitus without complications: Secondary | ICD-10-CM | POA: Diagnosis not present

## 2019-05-08 DIAGNOSIS — J069 Acute upper respiratory infection, unspecified: Secondary | ICD-10-CM | POA: Diagnosis not present

## 2019-05-08 DIAGNOSIS — R05 Cough: Secondary | ICD-10-CM | POA: Diagnosis not present

## 2019-05-08 DIAGNOSIS — Z20828 Contact with and (suspected) exposure to other viral communicable diseases: Secondary | ICD-10-CM | POA: Insufficient documentation

## 2019-05-08 DIAGNOSIS — Z7984 Long term (current) use of oral hypoglycemic drugs: Secondary | ICD-10-CM | POA: Insufficient documentation

## 2019-05-08 DIAGNOSIS — R42 Dizziness and giddiness: Secondary | ICD-10-CM | POA: Diagnosis not present

## 2019-05-08 DIAGNOSIS — R61 Generalized hyperhidrosis: Secondary | ICD-10-CM | POA: Diagnosis not present

## 2019-05-08 DIAGNOSIS — R000 Tachycardia, unspecified: Secondary | ICD-10-CM

## 2019-05-08 DIAGNOSIS — R51 Headache: Secondary | ICD-10-CM | POA: Diagnosis not present

## 2019-05-08 DIAGNOSIS — R059 Cough, unspecified: Secondary | ICD-10-CM

## 2019-05-08 LAB — CBG MONITORING, ED: Glucose-Capillary: 198 mg/dL — ABNORMAL HIGH (ref 70–99)

## 2019-05-08 LAB — CBC WITH DIFFERENTIAL/PLATELET
Abs Immature Granulocytes: 0.04 10*3/uL (ref 0.00–0.07)
Basophils Absolute: 0 10*3/uL (ref 0.0–0.1)
Basophils Relative: 0 %
Eosinophils Absolute: 0.1 10*3/uL (ref 0.0–0.5)
Eosinophils Relative: 1 %
HCT: 41.6 % (ref 39.0–52.0)
Hemoglobin: 14.2 g/dL (ref 13.0–17.0)
Immature Granulocytes: 1 %
Lymphocytes Relative: 19 %
Lymphs Abs: 1.4 10*3/uL (ref 0.7–4.0)
MCH: 31.6 pg (ref 26.0–34.0)
MCHC: 34.1 g/dL (ref 30.0–36.0)
MCV: 92.4 fL (ref 80.0–100.0)
Monocytes Absolute: 0.9 10*3/uL (ref 0.1–1.0)
Monocytes Relative: 12 %
Neutro Abs: 4.9 10*3/uL (ref 1.7–7.7)
Neutrophils Relative %: 67 %
Platelets: 143 10*3/uL — ABNORMAL LOW (ref 150–400)
RBC: 4.5 MIL/uL (ref 4.22–5.81)
RDW: 12.7 % (ref 11.5–15.5)
WBC: 7.4 10*3/uL (ref 4.0–10.5)
nRBC: 0 % (ref 0.0–0.2)

## 2019-05-08 LAB — COMPREHENSIVE METABOLIC PANEL
ALT: 35 U/L (ref 0–44)
AST: 28 U/L (ref 15–41)
Albumin: 3.3 g/dL — ABNORMAL LOW (ref 3.5–5.0)
Alkaline Phosphatase: 65 U/L (ref 38–126)
Anion gap: 12 (ref 5–15)
BUN: 11 mg/dL (ref 8–23)
CO2: 21 mmol/L — ABNORMAL LOW (ref 22–32)
Calcium: 8.2 mg/dL — ABNORMAL LOW (ref 8.9–10.3)
Chloride: 103 mmol/L (ref 98–111)
Creatinine, Ser: 0.77 mg/dL (ref 0.61–1.24)
GFR calc Af Amer: >60 mL/min (ref >60)
GFR calc non Af Amer: >60 mL/min (ref >60)
Glucose, Bld: 224 mg/dL — ABNORMAL HIGH (ref 70–99)
Potassium: 4.3 mmol/L (ref 3.5–5.1)
Sodium: 136 mmol/L (ref 135–145)
Total Bilirubin: 0.7 mg/dL (ref 0.3–1.2)
Total Protein: 6.8 g/dL (ref 6.5–8.1)

## 2019-05-08 MED ORDER — ACETAMINOPHEN 325 MG PO TABS
650.0000 mg | ORAL_TABLET | Freq: Once | ORAL | Status: AC
  Administered 2019-05-08: 19:00:00 650 mg via ORAL
  Filled 2019-05-08: qty 2

## 2019-05-08 MED ORDER — ONDANSETRON HCL 4 MG PO TABS: 4.0000 mg | ORAL_TABLET | Freq: Three times a day (TID) | ORAL | 0 refills | Status: AC | PRN

## 2019-05-08 MED ORDER — ACETAMINOPHEN 325 MG PO TABS
ORAL_TABLET | ORAL | Status: AC
  Filled 2019-05-08: qty 2

## 2019-05-08 MED ORDER — ACETAMINOPHEN 325 MG PO TABS
650.0000 mg | ORAL_TABLET | Freq: Once | ORAL | Status: AC
  Administered 2019-05-08: 650 mg via ORAL

## 2019-05-08 MED ORDER — SODIUM CHLORIDE 0.9 % IV SOLN
Freq: Once | INTRAVENOUS | Status: AC
  Administered 2019-05-08: 16:00:00 via INTRAVENOUS

## 2019-05-08 MED ORDER — BENZONATATE 200 MG PO CAPS: 200.0000 mg | ORAL_CAPSULE | Freq: Three times a day (TID) | ORAL | 0 refills | Status: AC | PRN

## 2019-05-08 NOTE — ED Triage Notes (Signed)
Patient presents to Urgent Care with complaints of cough and headache since 4 days ago. Patient reports he has been taking tessalon perles for his cough, but they have not been helping. Pt denies travel or contact with COVID positive persons.

## 2019-05-08 NOTE — ED Provider Notes (Addendum)
[facility] EMERGENCY DEPARTMENT Provider Note   CSN: [id number] Arrival date & time: 05/08/19  1600    History   Chief Complaint Chief Complaint  Patient presents with  . Loss of Consciousness    HPI Lance Mason is a 63 y.o. male.  With past medical history of diabetes, HLD, HTN who presents to the ED with chief complaint of fever, cough, headache for the past 4 days.  He originally presented to an urgent care with his wife for similar symptoms where he was diagnosed with a viral infection likely COVID.  However when trying to discharge patient patient had a witnessed syncopal episode.  Per chart review patient was sitting on a bench when he went to stood up he was diaphoretic and fell.  At that time patient was started on a fluid bolus and EMS was called for transport.  Patient denies passing out states that he got up and felt dizzy and fell but remembers the entire event.      HPI  Past Medical History:  Diagnosis Date  . Cataract   . Diabetes mellitus without complication ([place])   . Hyperlipidemia   . Hypertension     Patient Active Problem List   Diagnosis Date Noted  . Pleural thickening 06/24/2018  . Pure hypercholesterolemia 08/11/2016  . Type 2 diabetes mellitus without complication, without long-term current use of insulin ([place]) 06/06/2016  . Morbid obesity ([place]) 10/01/2015  . Essential hypertension 10/01/2015  . Lung nodule 09/30/2015    History reviewed. No pertinent surgical history.      Home Medications    Prior to Admission medications   Medication Sig Start Date End Date Taking? Authorizing Provider  ACCU-CHEK AVIVA PLUS test strip Check sugar once daily; spanish label. 06/19/18   Wardell Honour, MD  ACCU-CHEK [facility] LANCETS lancets Check sugar once daily 06/19/18   Wardell Honour, MD  atorvastatin (LIPITOR) 20 MG tablet Take 1 tablet (20 mg total) by mouth daily. 07/19/18   Rutherford Guys, MD  benzonatate (TESSALON) 200  MG capsule Take 1 capsule (200 mg total) by mouth 3 (three) times daily as needed for up to 7 days for cough. 05/08/19 05/15/19  Wieters, Hallie C, PA-C  blood glucose meter kit and supplies Dispense based on patient and insurance preference. Use up to four times daily as directed. (FOR ICD-9 250.00, 250.01). 05/31/16   Pollina, Gwenyth Allegra, MD  Blood Glucose Monitoring Suppl (ACCU-CHEK AVIVA PLUS) w/Device KIT  05/31/16   [provider]  gabapentin (NEURONTIN) 300 MG capsule Take 1 capsule (300 mg total) by mouth 3 (three) times daily. 07/19/18   Rutherford Guys, MD  glipiZIDE (GLUCOTROL XL) 10 MG 24 hr tablet Take 1 tablet (10 mg total) by mouth daily with breakfast. 07/19/18   Rutherford Guys, MD  metFORMIN (GLUCOPHAGE) 1000 MG tablet Take 1 tablet (1,000 mg total) by mouth 2 (two) times daily with a meal. 07/19/18   Rutherford Guys, MD  ondansetron (ZOFRAN) 4 MG tablet Take 1 tablet (4 mg total) by mouth every 8 (eight) hours as needed for nausea or vomiting. 05/08/19   Doneta Public, MD    Family History Family History  Problem Relation Age of Onset  . Heart disease Mother        pacemaker/arrhythmia.  . Hypertension Mother   . Heart disease Father     Social History Social History   Tobacco Use  . Smoking status: Former Smoker  Packs/day: 0.50    Years: 5.00    Pack years: 2.50    Types: Cigarettes    Last attempt to quit: 12/19/1994    Years since quitting: 24.4  . Smokeless tobacco: Never Used  Substance Use Topics  . Alcohol use: Yes    Alcohol/week: 0.0 standard drinks    Comment: occasionally beer  . Drug use: No     Allergies   Patient has no known allergies.   Review of Systems Review of Systems  Constitutional: Positive for activity change, appetite change, diaphoresis and fever. Negative for chills.  HENT: Negative for ear pain and sore throat.   Eyes: Negative for pain and visual disturbance.  Respiratory: Negative for cough and shortness of breath.    Cardiovascular: Positive for chest pain. Negative for palpitations.  Gastrointestinal: Positive for nausea. Negative for abdominal pain and vomiting.  Genitourinary: Negative for dysuria and hematuria.  Musculoskeletal: Negative for arthralgias and back pain.  Skin: Negative for color change and rash.  Neurological: Positive for headaches. Negative for seizures and syncope.  All other systems reviewed and are negative.    Physical Exam Updated Vital Signs BP 118/62 (BP Location: Left Arm)   Pulse 97   Temp 100 F (37.8 C) (Oral)   Resp 17   Ht '5\' 5"'  (1.651 m)   Wt 75.8 kg   SpO2 98%   BMI 27.79 kg/m   Physical Exam Vitals signs and nursing note reviewed.  Constitutional:      General: He is not in acute distress.    Appearance: He is well-developed. He is ill-appearing.  HENT:     Head: Normocephalic and atraumatic.  Eyes:     Conjunctiva/sclera: Conjunctivae normal.  Neck:     Musculoskeletal: Neck supple.  Cardiovascular:     Rate and Rhythm: Normal rate and regular rhythm.     Heart sounds: No murmur.  Pulmonary:     Effort: Pulmonary effort is normal. No respiratory distress.     Breath sounds: Normal breath sounds. No wheezing.  Abdominal:     Palpations: Abdomen is soft.     Tenderness: There is no abdominal tenderness.  Musculoskeletal:        General: No swelling or tenderness.     Right lower leg: No edema.     Left lower leg: No edema.  Skin:    General: Skin is warm and dry.  Neurological:     Mental Status: He is alert and oriented to person, place, and time.      ED Treatments / Results  Labs (all labs ordered are listed, but only abnormal results are displayed) Labs Reviewed  CBC WITH DIFFERENTIAL/PLATELET - Abnormal; Notable for the following components:      Result Value   Platelets 143 (*)    All other components within normal limits  COMPREHENSIVE METABOLIC PANEL - Abnormal; Notable for the following components:   CO2 21 (*)     Glucose, Bld 224 (*)    Calcium 8.2 (*)    Albumin 3.3 (*)    All other components within normal limits  CBG MONITORING, ED - Abnormal; Notable for the following components:   Glucose-Capillary 198 (*)    All other components within normal limits  NOVEL CORONAVIRUS, NAA (HOSPITAL ORDER, SEND-OUT TO REF LAB)    EKG EKG Interpretation  Date/Time:  Wednesday May 08 2019 16:03:52 EDT Ventricular Rate:  97 PR Interval:    QRS Duration: 90 QT Interval:  341 QTC Calculation:  434 R Axis:   54 Text Interpretation:  Sinus rhythm Borderline low voltage, extremity leads no significant change since earlier in the day Confirmed by Sherwood Gambler [id number]) on 05/08/2019 4:41:06 PM   Radiology Dg Chest Port 1 View  Result Date: 05/08/2019 CLINICAL DATA:  Cough and fever EXAM: PORTABLE CHEST 1 VIEW COMPARISON:  07/07/2018 FINDINGS: The lung volumes are low. There are hazy airspace opacities bilaterally, for example in the left mid lung zone. The lung volumes are low. Calcified pleural base PACS are noted at the lung bases. There is some pleural thickening along the right chest wall. There is no pneumothorax is the cardiac silhouette is enlarged. IMPRESSION: 1. No acute cardiopulmonary process. 2. Calcified pleural based plaques are again noted. 3. Low lung volumes.  Mild cardiomegaly. Electronically Signed   By: Constance Holster M.D.   On: 05/08/2019 17:18    Procedures Procedures (including critical care time)  Medications Ordered in ED Medications  acetaminophen (TYLENOL) tablet 650 mg (650 mg Oral Given 05/08/19 1902)     Initial Impression / Assessment and Plan / ED Course  I have reviewed the triage vital signs and the nursing notes.  Pertinent labs & imaging results that were available during my care of the patient were reviewed by me and considered in my medical decision making (see chart for details).        Lance Mason is a 63 y.o. male.  With past medical history of  diabetes, HLD, HTN who presents to the ED with chief complaint of fever, cough, headache for the past 4 days.  He presents to the ED after a witnessed syncopal episode at urgent care.  On initial exam he is well-appearing, not in acute distress, VSS.  Physical exam as above.  EKG obtained as above.  Will obtain labs and chest x-ray and COVID swab.  Labs show a bicarb of 21.  Patient was given 1 L of LR in the ED.  Labs otherwise unremarkable.  Chest x-ray without acute pulmonary disease.  Orthostatics vitals obtained on patient were WNL.  Patient at this time states he is feeling well and requesting to go home.  Discussed with patient that this is likely a viral illness and in the setting of COVID-19 cannot rule out COVID-19.  Advised him that his results to take 2 days to come back.  Advised him to isolate per the CDC current guidelines in which patient states he will do.  Strict return precautions given to patient including any shortness of breath chest pain or worsening of symptoms.  At the time of discharge and throughout his ED visit patient has been satting 98% on room air.  Feel patient is stable for discharge home at this time.  Patient discharged home in stable condition all questions and concerns answered at this time.   Lance Mason was evaluated in Emergency Department on 05/09/2019 for the symptoms described in the history of present illness. He was evaluated in the context of the global COVID-19 pandemic, which necessitated consideration that the patient might be at risk for infection with the SARS-CoV-2 virus that causes COVID-19. Institutional protocols and algorithms that pertain to the evaluation of patients at risk for COVID-19 are in a state of rapid change based on information released by regulatory bodies including the CDC and federal and state organizations. These policies and algorithms were followed during the patient's care in the ED.     Final Clinical Impressions(s)  / ED Diagnoses  Final diagnoses:  Suspected Covid-19 Virus Infection    ED Discharge Orders         Ordered    ondansetron (ZOFRAN) 4 MG tablet  Every 8 hours PRN     05/08/19 1900           Doneta Public, MD 05/08/19 [place]    Doneta Public, MD 05/09/19 1204    Sherwood Gambler, MD 05/10/19 2221

## 2019-05-08 NOTE — ED Notes (Addendum)
Dahlia Byes NP was walking down the hallway. The patient was sitting in a chair by the back door waiting for his ride. Family member asked Dahlia Byes NP for help. On her arrival the patient was was diaphoretic, pale and nauseous. Pt VS HR  101, O2 100% on RA BP 130/68. Blood sugar 210.  IV placed and IV NS bolus started per [facility] PA. Patient assisted to bed by Urgent Care staff. EKG completed. EMS called for transport. Report called to Dennie Maizes RN in the ER.

## 2019-05-08 NOTE — ED Provider Notes (Signed)
[facility]    CSN: [id number] Arrival date & time: 05/08/19  1335     History   Chief Complaint Chief Complaint  Patient presents with  . Cough  . Headache    HPI Lance Mason is a 63 y.o. male history of DM type II, hyperlipidemia, hypertension, presenting today for evaluation of cough, headache and fever.  Patient has had cough and fever for the past 4 days.  He has been using Tessalon with mild relief.  Denies any vomiting, but has had some mild nausea.  Denies diarrhea.  Denies history of smoking.  Has not tried any other over-the-counter medicines.  Cough is mainly been productive.  Denies shortness of breath or difficulty breathing, but does have discomfort in his bilateral sides and back with breathing and coughing.  Denies known exposure to COVID, but wife and daughter here with similar symptoms.  HPI  Past Medical History:  Diagnosis Date  . Cataract   . Diabetes mellitus without complication ([place])   . Hyperlipidemia   . Hypertension     Patient Active Problem List   Diagnosis Date Noted  . Pleural thickening 06/24/2018  . Pure hypercholesterolemia 08/11/2016  . Type 2 diabetes mellitus without complication, without long-term current use of insulin ([place]) 06/06/2016  . Morbid obesity ([place]) 10/01/2015  . Essential hypertension 10/01/2015  . Lung nodule 09/30/2015    History reviewed. No pertinent surgical history.     Home Medications    Prior to Admission medications   Medication Sig Start Date End Date Taking? Authorizing Provider  ACCU-CHEK AVIVA PLUS test strip Check sugar once daily; spanish label. 06/19/18   Wardell Honour, MD  ACCU-CHEK [facility] LANCETS lancets Check sugar once daily 06/19/18   Wardell Honour, MD  atorvastatin (LIPITOR) 20 MG tablet Take 1 tablet (20 mg total) by mouth daily. 07/19/18   Rutherford Guys, MD  benzonatate (TESSALON) 200 MG capsule Take 1 capsule (200 mg total) by mouth 3 (three) times daily as needed  for up to 7 days for cough. 05/08/19 05/15/19  Lyndell Gillyard C, PA-C  blood glucose meter kit and supplies Dispense based on patient and insurance preference. Use up to four times daily as directed. (FOR ICD-9 250.00, 250.01). 05/31/16   Pollina, Gwenyth Allegra, MD  Blood Glucose Monitoring Suppl (ACCU-CHEK AVIVA PLUS) w/Device KIT  05/31/16   [provider]  gabapentin (NEURONTIN) 300 MG capsule Take 1 capsule (300 mg total) by mouth 3 (three) times daily. 07/19/18   Rutherford Guys, MD  glipiZIDE (GLUCOTROL XL) 10 MG 24 hr tablet Take 1 tablet (10 mg total) by mouth daily with breakfast. 07/19/18   Rutherford Guys, MD  metFORMIN (GLUCOPHAGE) 1000 MG tablet Take 1 tablet (1,000 mg total) by mouth 2 (two) times daily with a meal. 07/19/18   Rutherford Guys, MD    Family History Family History  Problem Relation Age of Onset  . Heart disease Mother        pacemaker/arrhythmia.  . Hypertension Mother   . Heart disease Father     Social History Social History   Tobacco Use  . Smoking status: Former Smoker    Packs/day: 0.50    Years: 5.00    Pack years: 2.50    Types: Cigarettes    Last attempt to quit: 12/19/1994    Years since quitting: 24.4  . Smokeless tobacco: Never Used  Substance Use Topics  . Alcohol use: Yes    Alcohol/week:  0.0 standard drinks    Comment: occasionally beer  . Drug use: No     Allergies   Patient has no known allergies.   Review of Systems Review of Systems  Constitutional: Positive for chills, fatigue and fever. Negative for activity change and appetite change.  HENT: Positive for congestion. Negative for ear pain, rhinorrhea, sinus pressure, sore throat and trouble swallowing.   Eyes: Negative for discharge and redness.  Respiratory: Positive for cough. Negative for chest tightness and shortness of breath.   Cardiovascular: Negative for chest pain.  Gastrointestinal: Negative for abdominal pain, diarrhea, nausea and vomiting.   Musculoskeletal: Positive for myalgias.  Skin: Negative for rash.  Neurological: Positive for headaches. Negative for dizziness and light-headedness.     Physical Exam Triage Vital Signs ED Triage Vitals  Enc Vitals Group     BP 05/08/19 1410 107/66     Pulse Rate 05/08/19 1410 (!) 122     Resp 05/08/19 1410 20     Temp 05/08/19 1410 (!) 102.3 F (39.1 C)     Temp Source 05/08/19 1410 Oral     SpO2 05/08/19 1410 100 %     Weight --      Height --      Head Circumference --      Peak Flow --      Pain Score 05/08/19 1408 5     Pain Loc --      Pain Edu? --      Excl. in [place]? --    No data found.  Updated Vital Signs BP 107/66 (BP Location: Left Arm)   Pulse (!) 122   Temp (!) 102.3 F (39.1 C) (Oral)   Resp 20   SpO2 100%  Temperature rechecked 100.7 orally Visual Acuity Right Eye Distance:   Left Eye Distance:   Bilateral Distance:    Right Eye Near:   Left Eye Near:    Bilateral Near:     Physical Exam Vitals signs and nursing note reviewed.  Constitutional:      Appearance: He is well-developed.     Comments: Sitting comfortably on exam table, no acute distress  HENT:     Head: Normocephalic and atraumatic.     Ears:     Comments: Bilateral ears without tenderness to palpation of external auricle, tragus and mastoid, EAC's without erythema or swelling, TM's with good bony landmarks and cone of light. Non erythematous.    Mouth/Throat:     Comments: Oral mucosa pink and moist, no tonsillar enlargement or exudate. Posterior pharynx patent and nonerythematous, no uvula deviation or swelling. Normal phonation. Eyes:     Conjunctiva/sclera: Conjunctivae normal.  Neck:     Musculoskeletal: Neck supple.  Cardiovascular:     Rate and Rhythm: Regular rhythm. Tachycardia present.     Heart sounds: No murmur.  Pulmonary:     Effort: Pulmonary effort is normal. No respiratory distress.     Breath sounds: Normal breath sounds.     Comments: Breathing comfortably  at rest, CTABL, no wheezing, rales or other adventitious sounds auscultated Abdominal:     Palpations: Abdomen is soft.     Tenderness: There is no abdominal tenderness.  Skin:    General: Skin is warm and dry.  Neurological:     Mental Status: He is alert.      UC Treatments / Results  Labs (all labs ordered are listed, but only abnormal results are displayed) Labs Reviewed - No data to display  EKG  None  Radiology No results found.  Procedures Procedures (including critical care time)  Medications Ordered in UC Medications  acetaminophen (TYLENOL) tablet 650 mg (650 mg Oral Given 05/08/19 1416)    Initial Impression / Assessment and Plan / UC Course  I have reviewed the triage vital signs and the nursing notes.  Pertinent labs & imaging results that were available during my care of the patient were reviewed by me and considered in my medical decision making (see chart for details).    Patient with fever, tachycardia, cough x4 days. Temp and HR initially improved during visit. Lungs clear on exam, no hypoxia, likely COVID.  Recommended symptomatic and supportive care.  While patient was being discharged/waiting on ride, patient was standing for greater than 10 minutes and began to feel lightheaded, diaphoretic and nauseous, he was lowered to a chair, blood pressure obtained and was 937/16 systolic.  Blood sugar 210.  Heart rate initially reading 55 after presyncope episode, but improved to 90 and is now currently tachycardic.  Given this episode and high risk of COVID with comorbidities (HTN, DMT2), will recommend further evaluation and observation in ED, r/o pnuemonia. Likely vasovagal episode but feel further workup to ensure safe to d/c home. EKG sinus tach, no acute signs of ischemia or infarction.  Fluids started and patient transported via EMS to ED.   Final Clinical Impressions(s) / UC Diagnoses   Final diagnoses:  Viral URI with cough     Discharge Instructions      You likely have COVID Tylenol [phone] mg every 4-6 hours for fever and body aches Tessalon as needed every 8 hours for cough For mucus and congestion may try Mucinex or daily Zyrtec, do not use Sudafed Please drink plenty of fluids and rest Stay at home and limit contact with others Follow-up in emergency room if developing persistent fevers, increased shortness of breath or difficulty breathing    ED Prescriptions    Medication Sig Dispense Auth. Provider   benzonatate (TESSALON) 200 MG capsule Take 1 capsule (200 mg total) by mouth 3 (three) times daily as needed for up to 7 days for cough. 28 capsule Doniesha Landau C, PA-C     Controlled Substance Prescriptions [place] Controlled Substance Registry consulted? Not Applicable   Janith Lima, [place] 05/08/19 1544

## 2019-05-08 NOTE — ED Notes (Signed)
Provider at bedside

## 2019-05-08 NOTE — ED Triage Notes (Signed)
Pt was at Urgent care for cough, headache and fever. After treatment he went to leave and EMS reports pt had a syncopal episode. Pt states when he had the syncopal episode he began to sweat, and have chest pains., Pt is afebrile at this time.

## 2019-05-08 NOTE — Discharge Instructions (Addendum)
You likely have COVID Tylenol [phone] mg every 4-6 hours for fever and body aches Tessalon as needed every 8 hours for cough For mucus and congestion may try Mucinex or daily Zyrtec, do not use Sudafed Please drink plenty of fluids and rest Stay at home and limit contact with others Follow-up in emergency room if developing persistent fevers, increased shortness of breath or difficulty breathing

## 2019-05-09 LAB — NOVEL CORONAVIRUS, NAA (HOSP ORDER, SEND-OUT TO REF LAB; TAT 18-24 HRS): SARS-CoV-2, NAA: DETECTED — AB

## 2019-05-09 LAB — GLUCOSE, CAPILLARY: Glucose-Capillary: 210 mg/dL — ABNORMAL HIGH (ref 70–99)

## 2019-05-14 ENCOUNTER — Other Ambulatory Visit: Payer: Self-pay

## 2019-05-14 ENCOUNTER — Emergency Department (HOSPITAL_COMMUNITY): Payer: BLUE CROSS/BLUE SHIELD

## 2019-05-14 ENCOUNTER — Encounter (HOSPITAL_COMMUNITY): Payer: Self-pay | Admitting: Emergency Medicine

## 2019-05-14 ENCOUNTER — Inpatient Hospital Stay (HOSPITAL_COMMUNITY)
Admission: EM | Admit: 2019-05-14 | Discharge: 2019-06-19 | DRG: 870 | Disposition: E | Payer: BLUE CROSS/BLUE SHIELD | Attending: Internal Medicine | Admitting: Internal Medicine

## 2019-05-14 ENCOUNTER — Inpatient Hospital Stay (HOSPITAL_COMMUNITY): Payer: BLUE CROSS/BLUE SHIELD

## 2019-05-14 DIAGNOSIS — R0600 Dyspnea, unspecified: Secondary | ICD-10-CM | POA: Diagnosis not present

## 2019-05-14 DIAGNOSIS — Z515 Encounter for palliative care: Secondary | ICD-10-CM

## 2019-05-14 DIAGNOSIS — E876 Hypokalemia: Secondary | ICD-10-CM | POA: Diagnosis not present

## 2019-05-14 DIAGNOSIS — J189 Pneumonia, unspecified organism: Secondary | ICD-10-CM | POA: Diagnosis present

## 2019-05-14 DIAGNOSIS — E46 Unspecified protein-calorie malnutrition: Secondary | ICD-10-CM | POA: Diagnosis present

## 2019-05-14 DIAGNOSIS — Z9289 Personal history of other medical treatment: Secondary | ICD-10-CM

## 2019-05-14 DIAGNOSIS — U071 COVID-19: Secondary | ICD-10-CM

## 2019-05-14 DIAGNOSIS — E785 Hyperlipidemia, unspecified: Secondary | ICD-10-CM | POA: Diagnosis present

## 2019-05-14 DIAGNOSIS — J80 Acute respiratory distress syndrome: Secondary | ICD-10-CM | POA: Diagnosis not present

## 2019-05-14 DIAGNOSIS — Z66 Do not resuscitate: Secondary | ICD-10-CM | POA: Diagnosis not present

## 2019-05-14 DIAGNOSIS — I469 Cardiac arrest, cause unspecified: Secondary | ICD-10-CM | POA: Diagnosis not present

## 2019-05-14 DIAGNOSIS — J984 Other disorders of lung: Secondary | ICD-10-CM | POA: Diagnosis not present

## 2019-05-14 DIAGNOSIS — R0902 Hypoxemia: Secondary | ICD-10-CM | POA: Diagnosis not present

## 2019-05-14 DIAGNOSIS — Z7189 Other specified counseling: Secondary | ICD-10-CM

## 2019-05-14 DIAGNOSIS — E870 Hyperosmolality and hypernatremia: Secondary | ICD-10-CM | POA: Diagnosis not present

## 2019-05-14 DIAGNOSIS — E872 Acidosis: Secondary | ICD-10-CM | POA: Diagnosis not present

## 2019-05-14 DIAGNOSIS — I96 Gangrene, not elsewhere classified: Secondary | ICD-10-CM | POA: Diagnosis not present

## 2019-05-14 DIAGNOSIS — L89313 Pressure ulcer of right buttock, stage 3: Secondary | ICD-10-CM | POA: Diagnosis not present

## 2019-05-14 DIAGNOSIS — Z8249 Family history of ischemic heart disease and other diseases of the circulatory system: Secondary | ICD-10-CM

## 2019-05-14 DIAGNOSIS — R571 Hypovolemic shock: Secondary | ICD-10-CM | POA: Diagnosis present

## 2019-05-14 DIAGNOSIS — L89312 Pressure ulcer of right buttock, stage 2: Secondary | ICD-10-CM | POA: Diagnosis not present

## 2019-05-14 DIAGNOSIS — Z6838 Body mass index (BMI) 38.0-38.9, adult: Secondary | ICD-10-CM

## 2019-05-14 DIAGNOSIS — A419 Sepsis, unspecified organism: Secondary | ICD-10-CM | POA: Diagnosis not present

## 2019-05-14 DIAGNOSIS — E119 Type 2 diabetes mellitus without complications: Secondary | ICD-10-CM

## 2019-05-14 DIAGNOSIS — Z9911 Dependence on respirator [ventilator] status: Secondary | ICD-10-CM | POA: Diagnosis not present

## 2019-05-14 DIAGNOSIS — R6521 Severe sepsis with septic shock: Secondary | ICD-10-CM | POA: Diagnosis not present

## 2019-05-14 DIAGNOSIS — J9601 Acute respiratory failure with hypoxia: Secondary | ICD-10-CM | POA: Diagnosis present

## 2019-05-14 DIAGNOSIS — R230 Cyanosis: Secondary | ICD-10-CM | POA: Diagnosis not present

## 2019-05-14 DIAGNOSIS — Z978 Presence of other specified devices: Secondary | ICD-10-CM

## 2019-05-14 DIAGNOSIS — Z87891 Personal history of nicotine dependence: Secondary | ICD-10-CM

## 2019-05-14 DIAGNOSIS — Z431 Encounter for attention to gastrostomy: Secondary | ICD-10-CM | POA: Diagnosis not present

## 2019-05-14 DIAGNOSIS — E669 Obesity, unspecified: Secondary | ICD-10-CM | POA: Diagnosis present

## 2019-05-14 DIAGNOSIS — J9600 Acute respiratory failure, unspecified whether with hypoxia or hypercapnia: Secondary | ICD-10-CM | POA: Diagnosis not present

## 2019-05-14 DIAGNOSIS — R7989 Other specified abnormal findings of blood chemistry: Secondary | ICD-10-CM | POA: Diagnosis not present

## 2019-05-14 DIAGNOSIS — J181 Lobar pneumonia, unspecified organism: Secondary | ICD-10-CM | POA: Diagnosis not present

## 2019-05-14 DIAGNOSIS — Z7984 Long term (current) use of oral hypoglycemic drugs: Secondary | ICD-10-CM

## 2019-05-14 DIAGNOSIS — J9690 Respiratory failure, unspecified, unspecified whether with hypoxia or hypercapnia: Secondary | ICD-10-CM

## 2019-05-14 DIAGNOSIS — R569 Unspecified convulsions: Secondary | ICD-10-CM | POA: Diagnosis not present

## 2019-05-14 DIAGNOSIS — E1165 Type 2 diabetes mellitus with hyperglycemia: Secondary | ICD-10-CM | POA: Diagnosis not present

## 2019-05-14 DIAGNOSIS — J690 Pneumonitis due to inhalation of food and vomit: Secondary | ICD-10-CM | POA: Diagnosis present

## 2019-05-14 DIAGNOSIS — R918 Other nonspecific abnormal finding of lung field: Secondary | ICD-10-CM | POA: Diagnosis not present

## 2019-05-14 DIAGNOSIS — E7800 Pure hypercholesterolemia, unspecified: Secondary | ICD-10-CM | POA: Diagnosis present

## 2019-05-14 DIAGNOSIS — Z452 Encounter for adjustment and management of vascular access device: Secondary | ICD-10-CM

## 2019-05-14 DIAGNOSIS — E8770 Fluid overload, unspecified: Secondary | ICD-10-CM | POA: Diagnosis not present

## 2019-05-14 DIAGNOSIS — B370 Candidal stomatitis: Secondary | ICD-10-CM | POA: Diagnosis not present

## 2019-05-14 DIAGNOSIS — F05 Delirium due to known physiological condition: Secondary | ICD-10-CM | POA: Diagnosis not present

## 2019-05-14 DIAGNOSIS — M7989 Other specified soft tissue disorders: Secondary | ICD-10-CM | POA: Diagnosis not present

## 2019-05-14 DIAGNOSIS — J168 Pneumonia due to other specified infectious organisms: Secondary | ICD-10-CM | POA: Diagnosis not present

## 2019-05-14 DIAGNOSIS — L89303 Pressure ulcer of unspecified buttock, stage 3: Secondary | ICD-10-CM | POA: Diagnosis not present

## 2019-05-14 DIAGNOSIS — R140 Abdominal distension (gaseous): Secondary | ICD-10-CM | POA: Diagnosis not present

## 2019-05-14 DIAGNOSIS — I4891 Unspecified atrial fibrillation: Secondary | ICD-10-CM | POA: Diagnosis not present

## 2019-05-14 DIAGNOSIS — J1282 Pneumonia due to coronavirus disease 2019: Secondary | ICD-10-CM

## 2019-05-14 DIAGNOSIS — R945 Abnormal results of liver function studies: Secondary | ICD-10-CM | POA: Diagnosis not present

## 2019-05-14 DIAGNOSIS — Z01818 Encounter for other preprocedural examination: Secondary | ICD-10-CM

## 2019-05-14 DIAGNOSIS — I10 Essential (primary) hypertension: Secondary | ICD-10-CM | POA: Diagnosis present

## 2019-05-14 DIAGNOSIS — I998 Other disorder of circulatory system: Secondary | ICD-10-CM | POA: Diagnosis present

## 2019-05-14 DIAGNOSIS — G9341 Metabolic encephalopathy: Secondary | ICD-10-CM | POA: Diagnosis present

## 2019-05-14 DIAGNOSIS — Z4682 Encounter for fitting and adjustment of non-vascular catheter: Secondary | ICD-10-CM | POA: Diagnosis not present

## 2019-05-14 DIAGNOSIS — L89322 Pressure ulcer of left buttock, stage 2: Secondary | ICD-10-CM | POA: Diagnosis not present

## 2019-05-14 DIAGNOSIS — A4189 Other specified sepsis: Secondary | ICD-10-CM | POA: Diagnosis not present

## 2019-05-14 DIAGNOSIS — L89323 Pressure ulcer of left buttock, stage 3: Secondary | ICD-10-CM | POA: Diagnosis not present

## 2019-05-14 DIAGNOSIS — R001 Bradycardia, unspecified: Secondary | ICD-10-CM | POA: Diagnosis not present

## 2019-05-14 DIAGNOSIS — R0602 Shortness of breath: Secondary | ICD-10-CM | POA: Diagnosis not present

## 2019-05-14 DIAGNOSIS — L8993 Pressure ulcer of unspecified site, stage 3: Secondary | ICD-10-CM

## 2019-05-14 DIAGNOSIS — E11649 Type 2 diabetes mellitus with hypoglycemia without coma: Secondary | ICD-10-CM | POA: Diagnosis not present

## 2019-05-14 DIAGNOSIS — J1289 Other viral pneumonia: Secondary | ICD-10-CM | POA: Diagnosis present

## 2019-05-14 DIAGNOSIS — J920 Pleural plaque with presence of asbestos: Secondary | ICD-10-CM | POA: Diagnosis not present

## 2019-05-14 DIAGNOSIS — G9340 Encephalopathy, unspecified: Secondary | ICD-10-CM | POA: Diagnosis not present

## 2019-05-14 DIAGNOSIS — J9691 Respiratory failure, unspecified with hypoxia: Secondary | ICD-10-CM | POA: Diagnosis not present

## 2019-05-14 DIAGNOSIS — J90 Pleural effusion, not elsewhere classified: Secondary | ICD-10-CM | POA: Diagnosis not present

## 2019-05-14 DIAGNOSIS — R509 Fever, unspecified: Secondary | ICD-10-CM | POA: Diagnosis not present

## 2019-05-14 DIAGNOSIS — E1140 Type 2 diabetes mellitus with diabetic neuropathy, unspecified: Secondary | ICD-10-CM | POA: Diagnosis not present

## 2019-05-14 DIAGNOSIS — R000 Tachycardia, unspecified: Secondary | ICD-10-CM

## 2019-05-14 DIAGNOSIS — Z79899 Other long term (current) drug therapy: Secondary | ICD-10-CM

## 2019-05-14 DIAGNOSIS — R739 Hyperglycemia, unspecified: Secondary | ICD-10-CM | POA: Diagnosis not present

## 2019-05-14 DIAGNOSIS — R451 Restlessness and agitation: Secondary | ICD-10-CM | POA: Diagnosis not present

## 2019-05-14 DIAGNOSIS — Z4659 Encounter for fitting and adjustment of other gastrointestinal appliance and device: Secondary | ICD-10-CM

## 2019-05-14 LAB — TYPE AND SCREEN
ABO/RH(D): O POS
Antibody Screen: NEGATIVE

## 2019-05-14 LAB — PROCALCITONIN: Procalcitonin: 0.92 ng/mL

## 2019-05-14 LAB — LACTIC ACID, PLASMA: Lactic Acid, Venous: 1.5 mmol/L (ref 0.5–1.9)

## 2019-05-14 LAB — CBC WITH DIFFERENTIAL/PLATELET
Abs Immature Granulocytes: 0.03 10*3/uL (ref 0.00–0.07)
Basophils Absolute: 0 10*3/uL (ref 0.0–0.1)
Basophils Relative: 0 %
Eosinophils Absolute: 0 10*3/uL (ref 0.0–0.5)
Eosinophils Relative: 0 %
HCT: 37.9 % — ABNORMAL LOW (ref 39.0–52.0)
Hemoglobin: 13.2 g/dL (ref 13.0–17.0)
Immature Granulocytes: 0 %
Lymphocytes Relative: 16 %
Lymphs Abs: 1.2 10*3/uL (ref 0.7–4.0)
MCH: 31.7 pg (ref 26.0–34.0)
MCHC: 34.8 g/dL (ref 30.0–36.0)
MCV: 91.1 fL (ref 80.0–100.0)
Monocytes Absolute: 0.2 10*3/uL (ref 0.1–1.0)
Monocytes Relative: 3 %
Neutro Abs: 6 10*3/uL (ref 1.7–7.7)
Neutrophils Relative %: 81 %
Platelets: 210 10*3/uL (ref 150–400)
RBC: 4.16 MIL/uL — ABNORMAL LOW (ref 4.22–5.81)
RDW: 12.8 % (ref 11.5–15.5)
WBC: 7.5 10*3/uL (ref 4.0–10.5)
nRBC: 0 % (ref 0.0–0.2)

## 2019-05-14 LAB — BRAIN NATRIURETIC PEPTIDE: B Natriuretic Peptide: 26.8 pg/mL (ref 0.0–100.0)

## 2019-05-14 LAB — COMPREHENSIVE METABOLIC PANEL
ALT: 88 U/L — ABNORMAL HIGH (ref 0–44)
AST: 114 U/L — ABNORMAL HIGH (ref 15–41)
Albumin: 2.9 g/dL — ABNORMAL LOW (ref 3.5–5.0)
Alkaline Phosphatase: 96 U/L (ref 38–126)
Anion gap: 8 (ref 5–15)
BUN: 12 mg/dL (ref 8–23)
CO2: 23 mmol/L (ref 22–32)
Calcium: 7.8 mg/dL — ABNORMAL LOW (ref 8.9–10.3)
Chloride: 102 mmol/L (ref 98–111)
Creatinine, Ser: 0.75 mg/dL (ref 0.61–1.24)
GFR calc Af Amer: >60 mL/min (ref >60)
GFR calc non Af Amer: >60 mL/min (ref >60)
Glucose, Bld: 179 mg/dL — ABNORMAL HIGH (ref 70–99)
Potassium: 3.8 mmol/L (ref 3.5–5.1)
Sodium: 133 mmol/L — ABNORMAL LOW (ref 135–145)
Total Bilirubin: 0.5 mg/dL (ref 0.3–1.2)
Total Protein: 7.4 g/dL (ref 6.5–8.1)

## 2019-05-14 LAB — TROPONIN I: Troponin I: <0.03 ng/mL (ref <0.03)

## 2019-05-14 LAB — D-DIMER, QUANTITATIVE: D-Dimer, Quant: 3.48 ug/mL-FEU — ABNORMAL HIGH (ref 0.00–0.50)

## 2019-05-14 LAB — TRIGLYCERIDES: Triglycerides: 267 mg/dL — ABNORMAL HIGH (ref <150)

## 2019-05-14 LAB — CK TOTAL AND CKMB (NOT AT ARMC)
CK, MB: 1.7 ng/mL (ref 0.5–5.0)
Relative Index: INVALID (ref 0.0–2.5)
Total CK: 62 U/L (ref 49–397)

## 2019-05-14 LAB — C-REACTIVE PROTEIN: CRP: 36.7 mg/dL — ABNORMAL HIGH (ref <1.0)

## 2019-05-14 LAB — LACTATE DEHYDROGENASE: LDH: 425 U/L — ABNORMAL HIGH (ref 98–192)

## 2019-05-14 LAB — SEDIMENTATION RATE: Sed Rate: 129 mm/hr — ABNORMAL HIGH (ref 0–16)

## 2019-05-14 MED ORDER — VANCOMYCIN HCL 10 G IV SOLR
1500.0000 mg | Freq: Once | INTRAVENOUS | Status: AC
  Administered 2019-05-14: 1500 mg via INTRAVENOUS
  Filled 2019-05-14: qty 1500

## 2019-05-14 MED ORDER — ONDANSETRON HCL 4 MG/2ML IJ SOLN
4.0000 mg | Freq: Once | INTRAMUSCULAR | Status: AC
  Administered 2019-05-14: 4 mg via INTRAVENOUS
  Filled 2019-05-14: qty 2

## 2019-05-14 MED ORDER — SODIUM CHLORIDE 0.9 % IV SOLN
INTRAVENOUS | Status: AC
  Administered 2019-05-14: 22:00:00 via INTRAVENOUS

## 2019-05-14 MED ORDER — ENOXAPARIN SODIUM 40 MG/0.4ML ~~LOC~~ SOLN: 40.0000 mg | SUBCUTANEOUS | Status: DC

## 2019-05-14 MED ORDER — INSULIN ASPART 100 UNIT/ML ~~LOC~~ SOLN: 0.0000 [IU] | Freq: Every day | SUBCUTANEOUS | Status: DC

## 2019-05-14 MED ORDER — SODIUM CHLORIDE 0.9 % IV SOLN
2.0000 g | Freq: Three times a day (TID) | INTRAVENOUS | Status: DC
  Filled 2019-05-14: qty 2

## 2019-05-14 MED ORDER — SODIUM CHLORIDE 0.9 % IV BOLUS
500.0000 mL | Freq: Once | INTRAVENOUS | Status: AC
  Administered 2019-05-14: 500 mL via INTRAVENOUS

## 2019-05-14 MED ORDER — ACETAMINOPHEN 325 MG PO TABS
650.0000 mg | ORAL_TABLET | Freq: Four times a day (QID) | ORAL | Status: DC | PRN
  Administered 2019-05-14 – 2019-06-09 (×13): 650 mg via ORAL
  Filled 2019-05-14 (×13): qty 2

## 2019-05-14 MED ORDER — GABAPENTIN 300 MG PO CAPS
300.0000 mg | ORAL_CAPSULE | Freq: Three times a day (TID) | ORAL | Status: DC
  Administered 2019-05-15 – 2019-05-16 (×6): 300 mg via ORAL
  Filled 2019-05-14 (×9): qty 1

## 2019-05-14 MED ORDER — INSULIN ASPART 100 UNIT/ML ~~LOC~~ SOLN
0.0000 [IU] | Freq: Three times a day (TID) | SUBCUTANEOUS | Status: DC
  Administered 2019-05-15: 2 [IU] via SUBCUTANEOUS
  Administered 2019-05-15: 3 [IU] via SUBCUTANEOUS
  Administered 2019-05-15: 1 [IU] via SUBCUTANEOUS
  Administered 2019-05-16: 3 [IU] via SUBCUTANEOUS
  Administered 2019-05-16: 2 [IU] via SUBCUTANEOUS

## 2019-05-14 MED ORDER — SODIUM CHLORIDE 0.9 % IV SOLN
2.0000 g | Freq: Three times a day (TID) | INTRAVENOUS | Status: AC
  Administered 2019-05-15 – 2019-05-21 (×21): 2 g via INTRAVENOUS
  Filled 2019-05-14 (×22): qty 2

## 2019-05-14 MED ORDER — SODIUM CHLORIDE 0.9 % IV SOLN
500.0000 mg | Freq: Every day | INTRAVENOUS | Status: AC
  Administered 2019-05-15 – 2019-05-21 (×8): 500 mg via INTRAVENOUS
  Filled 2019-05-14 (×8): qty 500

## 2019-05-14 NOTE — ED Notes (Signed)
Asked pt if I could speak with his granddaughter Steward Drone, and he gave me permission to speak with her about his medical care.

## 2019-05-14 NOTE — ED Notes (Signed)
ED Provider at bedside. 

## 2019-05-14 NOTE — ED Notes (Signed)
Waiting on [facility] 

## 2019-05-14 NOTE — ED Provider Notes (Signed)
[facility] DEPT Provider Note   CSN: [id number] Arrival date & time: 04/23/2019  1749    History   Chief Complaint Chief Complaint  Patient presents with  . Shortness of Breath    HPI Lance Mason is a 63 y.o. male.     HPI Patient was seen 6 days ago with episode of syncope.  Had coronavirus testing at that time.  Testing came back positive.  Patient had increased shortness of breath and central chest pain.  States he is had several episodes of vomiting over the last 2 days.  Denies diarrhea.  No abdominal pain.  No new lower extremity swelling or pain. Past Medical History:  Diagnosis Date  . Cataract   . Diabetes mellitus without complication ([place])   . Hyperlipidemia   . Hypertension     Patient Active Problem List   Diagnosis Date Noted  . Pleural thickening 06/24/2018  . Pure hypercholesterolemia 08/11/2016  . Type 2 diabetes mellitus without complication, without long-term current use of insulin ([place]) 06/06/2016  . Morbid obesity ([place]) 10/01/2015  . Essential hypertension 10/01/2015  . Lung nodule 09/30/2015    History reviewed. No pertinent surgical history.      Home Medications    Prior to Admission medications   Medication Sig Start Date End Date Taking? Authorizing Provider  ACCU-CHEK AVIVA PLUS test strip Check sugar once daily; spanish label. 06/19/18   Wardell Honour, MD  ACCU-CHEK [facility] LANCETS lancets Check sugar once daily 06/19/18   Wardell Honour, MD  atorvastatin (LIPITOR) 20 MG tablet Take 1 tablet (20 mg total) by mouth daily. 07/19/18   Rutherford Guys, MD  benzonatate (TESSALON) 200 MG capsule Take 1 capsule (200 mg total) by mouth 3 (three) times daily as needed for up to 7 days for cough. 05/08/19 05/15/19  Wieters, Hallie C, PA-C  blood glucose meter kit and supplies Dispense based on patient and insurance preference. Use up to four times daily as directed. (FOR ICD-9 250.00, 250.01). 05/31/16    Pollina, Gwenyth Allegra, MD  Blood Glucose Monitoring Suppl (ACCU-CHEK AVIVA PLUS) w/Device KIT  05/31/16   [provider]  gabapentin (NEURONTIN) 300 MG capsule Take 1 capsule (300 mg total) by mouth 3 (three) times daily. 07/19/18   Rutherford Guys, MD  glipiZIDE (GLUCOTROL XL) 10 MG 24 hr tablet Take 1 tablet (10 mg total) by mouth daily with breakfast. 07/19/18   Rutherford Guys, MD  metFORMIN (GLUCOPHAGE) 1000 MG tablet Take 1 tablet (1,000 mg total) by mouth 2 (two) times daily with a meal. 07/19/18   Rutherford Guys, MD  ondansetron (ZOFRAN) 4 MG tablet Take 1 tablet (4 mg total) by mouth every 8 (eight) hours as needed for nausea or vomiting. 05/08/19   Doneta Public, MD    Family History Family History  Problem Relation Age of Onset  . Heart disease Mother        pacemaker/arrhythmia.  . Hypertension Mother   . Heart disease Father     Social History Social History   Tobacco Use  . Smoking status: Former Smoker    Packs/day: 0.50    Years: 5.00    Pack years: 2.50    Types: Cigarettes    Last attempt to quit: 12/19/1994    Years since quitting: 24.4  . Smokeless tobacco: Never Used  Substance Use Topics  . Alcohol use: Yes    Alcohol/week: 0.0 standard drinks    Comment: occasionally beer  .  Drug use: No     Allergies   Patient has no known allergies.   Review of Systems Review of Systems  Constitutional: Positive for fatigue.  HENT: Negative for sore throat and trouble swallowing.   Respiratory: Positive for cough and shortness of breath.   Cardiovascular: Positive for chest pain. Negative for palpitations and leg swelling.  Gastrointestinal: Positive for nausea and vomiting. Negative for abdominal pain, constipation and diarrhea.  Musculoskeletal: Negative for back pain, joint swelling, myalgias and neck pain.  Skin: Negative for rash and wound.  Neurological: Negative for dizziness, weakness, light-headedness, numbness and headaches.  All other systems  reviewed and are negative.    Physical Exam Updated Vital Signs BP (!) 109/94   Pulse (!) 123   Temp 100 F (37.8 C) (Oral)   Resp (!) 26   SpO2 94%   Physical Exam Vitals signs and nursing note reviewed.  Constitutional:      Appearance: Normal appearance. He is well-developed.  HENT:     Head: Normocephalic and atraumatic.  Eyes:     Pupils: Pupils are equal, round, and reactive to light.  Neck:     Musculoskeletal: Normal range of motion and neck supple. No neck rigidity or muscular tenderness.  Cardiovascular:     Rate and Rhythm: Regular rhythm. Tachycardia present.  Pulmonary:     Effort: Pulmonary effort is normal.     Breath sounds: Rales present.     Comments: Tachypnea.  Rales in bilateral bases. Abdominal:     General: Bowel sounds are normal.     Palpations: Abdomen is soft.     Tenderness: There is no abdominal tenderness. There is no right CVA tenderness, left CVA tenderness, guarding or rebound.  Musculoskeletal: Normal range of motion.        General: No swelling, tenderness, deformity or signs of injury.     Right lower leg: No edema.     Left lower leg: No edema.  Lymphadenopathy:     Cervical: No cervical adenopathy.  Skin:    General: Skin is warm and dry.     Capillary Refill: Capillary refill takes less than 2 seconds.     Findings: No erythema or rash.  Neurological:     General: No focal deficit present.     Mental Status: He is alert and oriented to person, place, and time.  Psychiatric:        Mood and Affect: Mood normal.        Behavior: Behavior normal.      ED Treatments / Results  Labs (all labs ordered are listed, but only abnormal results are displayed) Labs Reviewed  CBC WITH DIFFERENTIAL/PLATELET - Abnormal; Notable for the following components:      Result Value   RBC 4.16 (*)    HCT 37.9 (*)    All other components within normal limits  COMPREHENSIVE METABOLIC PANEL - Abnormal; Notable for the following components:    Sodium 133 (*)    Glucose, Bld 179 (*)    Calcium 7.8 (*)    Albumin 2.9 (*)    AST 114 (*)    ALT 88 (*)    All other components within normal limits  CULTURE, BLOOD (ROUTINE X 2)  CULTURE, BLOOD (ROUTINE X 2)  BRAIN NATRIURETIC PEPTIDE  TROPONIN I  LACTIC ACID, PLASMA    EKG None  Radiology Dg Chest Port 1 View  Result Date: 05/08/2019 CLINICAL DATA:  63 year old male with history of worsening shortness of breath.  Diagnosed with COVID-19 10 days ago. EXAM: PORTABLE CHEST 1 VIEW COMPARISON:  Chest x-ray 05/08/2019. FINDINGS: Low lung volumes. Calcified pleural plaques throughout the thorax bilaterally again noted. Mild diffuse interstitial prominence and patchy ill-defined airspace disease throughout the mid to lower lungs bilaterally (left greater than right). No pleural effusions. No evidence of pulmonary edema. Heart size is borderline enlarged, accentuated by low lung volumes and portable AP technique. Upper mediastinal contours are within normal limits. IMPRESSION: 1. Findings are compatible with multilobar pneumonia, as above. 2. Asbestos related pleural disease redemonstrated. Electronically Signed   By: Vinnie Langton M.D.   On: 04/28/2019 18:49    Procedures Procedures (including critical care time)  Medications Ordered in ED Medications  sodium chloride 0.9 % bolus 500 mL (has no administration in time range)  ondansetron (ZOFRAN) injection 4 mg (4 mg Intravenous Given 05/01/2019 1825)     Initial Impression / Assessment and Plan / ED Course  I have reviewed the triage vital signs and the nursing notes.  Pertinent labs & imaging results that were available during my care of the patient were reviewed by me and considered in my medical decision making (see chart for details).        Patient with increased oxygen requirements.  Suspect progression of his COVID illness.  Multifocal pneumonia on chest x-ray.  Normal white blood cell count.  Mild elevation in liver  enzyme.  Consistent with pneumonia due to COVID.  Do not believe that the patient needs to be intubated at this point.  Discussed with hospitalist who will arrange admission. Final Clinical Impressions(s) / ED Diagnoses   Final diagnoses:  Pneumonia due to COVID-19 virus    ED Discharge Orders    None       Julianne Rice, MD 04/29/2019 1931

## 2019-05-14 NOTE — H&P (Addendum)
TRH H&P    Patient Demographics:    Lance Mason, is a 63 y.o. male  MRN: [id number]  DOB - Jul 17, 1956  Admit Date - 05/09/2019  Referring MD/NP/PA: Julianne Rice  Outpatient Primary MD for the patient is Patient, No Pcp Per  Patient coming from: home  Chief complaint-   sob   HPI:    Lance Mason  is a 63 y.o. male, w DM2 (Hga1c=9.1 06/19/2018), Hyperlipidemia, apparently covid + (05/09/19) presents due to sob.  Pox 84% on RA,  96% on NRB. Pt notes slight cough x1 week along with sob x 1 week.  Pt had n/v x1.  Subjective fever recently. Sharp cp with cough.  Pt denies sore throat, palp, abd pain, diarrhea, brbpr, dysuria, hematuria.  Pt presented to ED due to SOB.   In ED,  T 102.5  P 121  R 36  Bp 165/93  Pox 92% on NRB,  84% on RA 86% on 2Lnc  CXR IMPRESSION: 1. Findings are compatible with multilobar pneumonia, as above. 2. Asbestos related pleural disease redemonstrated.  Wbc 7.5, Hgb 13.2, Plt 210 Na 133, K 3.8,  Bun 12, Creatinine 0.75   Ast 114, Alt 88, Alk phos 96, T. Bili 0.5 Alb 2.9  Glucose 179 Trop <0.03 D dimer 3.48  LDH 425 Procalcitonin 0.92 ESR 129 Crp 36.7 Tg 267  Pt will be admitted for dyspnea,  Secondary to multifocal pneumonia (CAP), covid + and r/o pulmonary embolism    Review of systems:    In addition to the HPI above,   No Headache, No changes with Vision or hearing, No problems swallowing food or Liquids,  No Abdominal pain, No Nausea or Vomiting, bowel movements are regular, No Blood in stool or Urine, No dysuria, No new skin rashes or bruises, No new joints pains-aches,  No new weakness, tingling, numbness in any extremity, No recent weight gain or loss, No polyuria, polydypsia or polyphagia, No significant Mental Stressors.  All other systems reviewed and are negative.    Past History of the following :    Past Medical  History:  Diagnosis Date  . Cataract   . Diabetes mellitus without complication ([place])   . Hyperlipidemia   . Hypertension   . Pleural plaque 10/05/2015   calcified pleural plaque bilaterally on CT chest      History reviewed. No pertinent surgical history.    Social History:      Social History   Tobacco Use  . Smoking status: Former Smoker    Packs/day: 0.50    Years: 5.00    Pack years: 2.50    Types: Cigarettes    Last attempt to quit: 12/19/1994    Years since quitting: 24.4  . Smokeless tobacco: Never Used  Substance Use Topics  . Alcohol use: Yes    Alcohol/week: 0.0 standard drinks    Comment: occasionally beer       Family History :     Family History  Problem Relation Age of Onset  . Heart disease Mother  pacemaker/arrhythmia.  . Hypertension Mother   . Heart disease Father        Home Medications:   Prior to Admission medications   Medication Sig Start Date End Date Taking? Authorizing Provider  ACCU-CHEK AVIVA PLUS test strip Check sugar once daily; spanish label. 06/19/18   Wardell Honour, MD  ACCU-CHEK [facility] LANCETS lancets Check sugar once daily 06/19/18   Wardell Honour, MD  atorvastatin (LIPITOR) 20 MG tablet Take 1 tablet (20 mg total) by mouth daily. 07/19/18   Rutherford Guys, MD  benzonatate (TESSALON) 200 MG capsule Take 1 capsule (200 mg total) by mouth 3 (three) times daily as needed for up to 7 days for cough. 05/08/19 05/15/19  Wieters, Hallie C, PA-C  blood glucose meter kit and supplies Dispense based on patient and insurance preference. Use up to four times daily as directed. (FOR ICD-9 250.00, 250.01). 05/31/16   Pollina, Gwenyth Allegra, MD  Blood Glucose Monitoring Suppl (ACCU-CHEK AVIVA PLUS) w/Device KIT  05/31/16   [provider]  gabapentin (NEURONTIN) 300 MG capsule Take 1 capsule (300 mg total) by mouth 3 (three) times daily. 07/19/18   Rutherford Guys, MD  glipiZIDE (GLUCOTROL XL) 10 MG 24 hr tablet Take 1 tablet  (10 mg total) by mouth daily with breakfast. 07/19/18   Rutherford Guys, MD  metFORMIN (GLUCOPHAGE) 1000 MG tablet Take 1 tablet (1,000 mg total) by mouth 2 (two) times daily with a meal. 07/19/18   Rutherford Guys, MD  ondansetron (ZOFRAN) 4 MG tablet Take 1 tablet (4 mg total) by mouth every 8 (eight) hours as needed for nausea or vomiting. 05/08/19   Doneta Public, MD     Allergies:    No Known Allergies   Physical Exam:   Vitals  Blood pressure (!) 158/93, pulse (!) 128, temperature (!) 102.5 F (39.2 C), temperature source Oral, resp. rate (!) 25, SpO2 93 %.  1.  General: Aoxox3,  Speaks spanish  2. Psychiatric: euthymic  3. Neurologic: cn2-12 intact, reflexes 2+ symmetric diffuse with no clonus, motor 5/5 in all 4 ext  4. HEENMT:  Anicteric, pupils 1.58m symmetric, direct, consensual, near intact eomi Mmm Neck: no jvd  5. Respiratory : Slight crackles l > right base, no wheezing  6. Cardiovascular : Tachy s1, s2, no m/g/r  7. Gastrointestinal:  Abd: soft, obese, nt, nd, +bs  8. Skin:  Ext: no c/c/e,  No rash  9.Musculoskeletal:  Good ROM  No adenopathy    Data Review:    CBC Recent Labs  Lab 05/08/19 1643 04/28/2019 1815  WBC 7.4 7.5  HGB 14.2 13.2  HCT 41.6 37.9*  PLT 143* 210  MCV 92.4 91.1  MCH 31.6 31.7  MCHC 34.1 34.8  RDW 12.7 12.8  LYMPHSABS 1.4 1.2  MONOABS 0.9 0.2  EOSABS 0.1 0.0  BASOSABS 0.0 0.0   ------------------------------------------------------------------------------------------------------------------  Results for orders placed or performed during the hospital encounter of 05/03/2019 (from the past 48 hour(s))  CBC with Differential/Platelet     Status: Abnormal   Collection Time: 05/10/2019  6:15 PM  Result Value Ref Range   WBC 7.5 4.0 - 10.5 K/uL    Comment: WHITE COUNT CONFIRMED ON SMEAR   RBC 4.16 (L) 4.22 - 5.81 MIL/uL   Hemoglobin 13.2 13.0 - 17.0 g/dL   HCT 37.9 (L) 39.0 - 52.0 %   MCV 91.1 80.0 - 100.0 fL   MCH  31.7 26.0 - 34.0 pg   MCHC 34.8  30.0 - 36.0 g/dL   RDW 12.8 11.5 - 15.5 %   Platelets 210 150 - 400 K/uL   nRBC 0.0 0.0 - 0.2 %   Neutrophils Relative % 81 %   Neutro Abs 6.0 1.7 - 7.7 K/uL   Lymphocytes Relative 16 %   Lymphs Abs 1.2 0.7 - 4.0 K/uL   Monocytes Relative 3 %   Monocytes Absolute 0.2 0.1 - 1.0 K/uL   Eosinophils Relative 0 %   Eosinophils Absolute 0.0 0.0 - 0.5 K/uL   Basophils Relative 0 %   Basophils Absolute 0.0 0.0 - 0.1 K/uL   Immature Granulocytes 0 %   Abs Immature Granulocytes 0.03 0.00 - 0.07 K/uL    Comment: Performed at [facility], [place] [address] [place], [place] [address]  Comprehensive metabolic panel     Status: Abnormal   Collection Time: 05/19/2019  6:15 PM  Result Value Ref Range   Sodium 133 (L) 135 - 145 mmol/L   Potassium 3.8 3.5 - 5.1 mmol/L   Chloride 102 98 - 111 mmol/L   CO2 23 22 - 32 mmol/L   Glucose, Bld 179 (H) 70 - 99 mg/dL   BUN 12 8 - 23 mg/dL   Creatinine, Ser 0.75 0.61 - 1.24 mg/dL   Calcium 7.8 (L) 8.9 - 10.3 mg/dL   Total Protein 7.4 6.5 - 8.1 g/dL   Albumin 2.9 (L) 3.5 - 5.0 g/dL   AST 114 (H) 15 - 41 U/L   ALT 88 (H) 0 - 44 U/L   Alkaline Phosphatase 96 38 - 126 U/L   Total Bilirubin 0.5 0.3 - 1.2 mg/dL   GFR calc non Af Amer >60 >60 mL/min   GFR calc Af Amer >60 >60 mL/min   Anion gap 8 5 - 15    Comment: Performed at [facility], [place] [address], [place], [place] [address]  Brain natriuretic peptide     Status: None   Collection Time: 05/03/2019  6:15 PM  Result Value Ref Range   B Natriuretic Peptide 26.8 0.0 - 100.0 pg/mL    Comment: Performed at [facility], [place] [address], [place], [place] [address]  Troponin I - ONCE - STAT     Status: None   Collection Time: 05/18/2019  6:15 PM  Result Value Ref Range   Troponin I <0.03 <0.03 ng/mL    Comment: Performed at [facility], [place] [address], [place], [place] [address]  D-dimer, quantitative  (not at [facility])     Status: Abnormal   Collection Time: 04/27/2019  6:15 PM  Result Value Ref Range   D-Dimer, Quant 3.48 (H) 0.00 - 0.50 ug/mL-FEU    Comment: (NOTE) At the manufacturer cut-off of 0.50 ug/mL FEU, this assay has been documented to exclude PE with a sensitivity and negative predictive value of 97 to 99%.  At this time, this assay has not been approved by the FDA to exclude DVT/VTE. Results should be correlated with clinical presentation. Performed at [facility], [place] [address], [place], [place] [address]   Lactate dehydrogenase     Status: Abnormal   Collection Time: 04/24/2019  6:15 PM  Result Value Ref Range   LDH 425 (H) 98 - 192 U/L    Comment: Performed at [facility], [place] [address] [place],  [address]  Procalcitonin     Status: None   Collection Time: 04/22/2019  6:15 PM  Result Value Ref  Range   Procalcitonin 0.92 ng/mL    Comment:        Interpretation: PCT > 0.5 ng/mL and <= 2 ng/mL: Systemic infection (sepsis) is possible, but other conditions are known to elevate PCT as well. (NOTE)       Sepsis PCT Algorithm           Lower Respiratory Tract                                      Infection PCT Algorithm    ----------------------------     ----------------------------         PCT < 0.25 ng/mL                PCT < 0.10 ng/mL         Strongly encourage             Strongly discourage   discontinuation of antibiotics    initiation of antibiotics    ----------------------------     -----------------------------       PCT 0.25 - 0.50 ng/mL            PCT 0.10 - 0.25 ng/mL               OR       >80% decrease in PCT            Discourage initiation of                                            antibiotics      Encourage discontinuation           of antibiotics    ----------------------------     -----------------------------         PCT >= 0.50 ng/mL              PCT 0.26 - 0.50 ng/mL                AND        <80% decrease in PCT             Encourage initiation of                                             antibiotics       Encourage continuation           of antibiotics    ----------------------------     -----------------------------        PCT >= 0.50 ng/mL                  PCT > 0.50 ng/mL               AND         increase in PCT                  Strongly encourage                                      initiation of antibiotics    Strongly encourage escalation  of antibiotics                                     -----------------------------                                           PCT <= 0.25 ng/mL                                                 OR                                        > 80% decrease in PCT                                     Discontinue / Do not initiate                                             antibiotics Performed at [place] [address], [place], [place] [address]   Sedimentation rate     Status: Abnormal   Collection Time: 05/17/2019  6:15 PM  Result Value Ref Range   Sed Rate 129 (H) 0 - 16 mm/hr    Comment: Performed at [facility], [place] [address], [place], [place] [address]  Lactic acid, plasma     Status: None   Collection Time: 05/12/2019  8:15 PM  Result Value Ref Range   Lactic Acid, Venous 1.5 0.5 - 1.9 mmol/L    Comment: Performed at [facility], [place] [address] [place], [place] [address]  C-reactive protein     Status: Abnormal   Collection Time: 04/21/2019  8:18 PM  Result Value Ref Range   CRP 36.7 (H) <1.0 mg/dL    Comment: Performed at [facility], [place] [address], [place], [place] [address]  Triglycerides     Status: Abnormal   Collection Time: 05/18/2019  8:18 PM  Result Value Ref Range   Triglycerides 267 (H) <150 mg/dL    Comment: Performed at [facility], [place] [place],  [address]    Chemistries  Recent  Labs  Lab 05/08/19 1643 05/12/2019 1815  NA 136 133*  K 4.3 3.8  CL 103 102  CO2 21* 23  GLUCOSE 224* 179*  BUN 11 12  CREATININE 0.77 0.75  CALCIUM 8.2* 7.8*  AST 28 114*  ALT 35 88*  ALKPHOS 65 96  BILITOT 0.7 0.5   ------------------------------------------------------------------------------------------------------------------  ------------------------------------------------------------------------------------------------------------------ GFR: Estimated Creatinine Clearance: 89.8 mL/min (by C-G formula based on SCr of 0.75 mg/dL). Liver Function Tests: Recent Labs  Lab 05/08/19 1643 05/10/2019 1815  AST 28 114*  ALT 35 88*  ALKPHOS 65 96  BILITOT 0.7 0.5  PROT 6.8 7.4  ALBUMIN 3.3* 2.9*   No results for input(s): LIPASE, AMYLASE in the last 168 hours. No results  for input(s): AMMONIA in the last 168 hours. Coagulation Profile: No results for input(s): INR, PROTIME in the last 168 hours. Cardiac Enzymes: Recent Labs  Lab 05/03/2019 1815  TROPONINI <0.03   BNP (last 3 results) No results for input(s): PROBNP in the last 8760 hours. HbA1C: No results for input(s): HGBA1C in the last 72 hours. CBG: Recent Labs  Lab 05/08/19 1532 05/08/19 1610  GLUCAP 210* 198*   Lipid Profile: Recent Labs    05/11/2019 2018  TRIG 267*   Thyroid Function Tests: No results for input(s): TSH, T4TOTAL, FREET4, T3FREE, THYROIDAB in the last 72 hours. Anemia Panel: No results for input(s): VITAMINB12, FOLATE, FERRITIN, TIBC, IRON, RETICCTPCT in the last 72 hours.  --------------------------------------------------------------------------------------------------------------- Urine analysis:    Component Value Date/Time   COLORURINE YELLOW 05/28/2016 2056   APPEARANCEUR CLEAR 05/28/2016 2056   LABSPEC 1.041 (H) 05/28/2016 2056   PHURINE 5.0 05/28/2016 2056   GLUCOSEU >1000 (A) 05/28/2016 2056   HGBUR NEGATIVE 05/28/2016 2056   BILIRUBINUR negative 06/19/2018 1601    KETONESUR negative 06/19/2018 1601   KETONESUR 15 (A) 05/28/2016 2056   PROTEINUR negative 06/19/2018 1601   PROTEINUR NEGATIVE 05/28/2016 2056   UROBILINOGEN 0.2 06/19/2018 1601   NITRITE Negative 06/19/2018 1601   NITRITE NEGATIVE 05/28/2016 2056   LEUKOCYTESUR Negative 06/19/2018 1601      Imaging Results:    Dg Chest Port 1 View  Result Date: 04/25/2019 CLINICAL DATA:  63 year old male with history of worsening shortness of breath. Diagnosed with COVID-19 10 days ago. EXAM: PORTABLE CHEST 1 VIEW COMPARISON:  Chest x-ray 05/08/2019. FINDINGS: Low lung volumes. Calcified pleural plaques throughout the thorax bilaterally again noted. Mild diffuse interstitial prominence and patchy ill-defined airspace disease throughout the mid to lower lungs bilaterally (left greater than right). No pleural effusions. No evidence of pulmonary edema. Heart size is borderline enlarged, accentuated by low lung volumes and portable AP technique. Upper mediastinal contours are within normal limits. IMPRESSION: 1. Findings are compatible with multilobar pneumonia, as above. 2. Asbestos related pleural disease redemonstrated. Electronically Signed   By: Vinnie Langton M.D.   On: 04/22/2019 18:49    ekg st at 120, nl axis, slightly prolonged qt, no st-t changes c/w ischemia   Assessment & Plan:    Active Problems:   Type 2 diabetes mellitus without complication, without long-term current use of insulin (HCC)   Pure hypercholesterolemia   COVID-19 virus infection  Acute hypoxic respiratory failure secondary to CAP, COVID -19 infection  Blood culture x2 Urine strep , urine legionella antigen Start vanco iv, cefepime iv (pharmacy to dose), zithromax 536m iv qday (monitor ekg daily while on zithromax) Check cbc, cmp daily Check ekg daily Pulmonary consult, spoke with Dr. REli Phillipsdue to high risk covid Consider Actemra  Sepsis (hypoxia, tachycardia, rr 36, fever) Blood culture as above Hydrate  with ns iv Abx as above  Tachycardia Tele Trop I q6h x3 Check cardiac echo Check CTA chest r/o PE   Dm2 DC Metformin DC Glucotrol fsbs ac and qhs, ISS  Diabetic neuropathy Cont Gabapentin  Hyperlipidemia Hold Lipitor due to abnormal liver function  Abnormal liver function Check acute hepatitis panel Ultrasound not done due to per tech , radiologist thinks that should be postponed due to Covid + STOP Lipitor as above Check cmp in am   DVT Prophylaxis-   Lovenox - SCDs   AM Labs Ordered, also please review Full Orders  Family Communication: Admission, patients condition and plan of care including  tests being ordered have been discussed with the patient  who indicate understanding and agree with the plan and Code Status.  Code Status:  FULL CODE  Admission status: Inpatient: Based on patients clinical presentation and evaluation of above clinical data, I have made determination that patient meets Inpatient criteria at this time  .  Pt is only able to maintain o2 sat >90 on NRB,  Pt will require iv abx for sepsis secondary to pneumonia,/ covid.  Pt risk factors are obeisity and dm2, as well as age and male sex.  Pt is critically ill with fever, tachycardia, tachypnea, ESR >100, Crp elevated at 36, D dimer +, abnormal liver function.   pt has high risk to decompensate due to his illness, acute respiratory failure (hypoxic), w sepsis secondary to Covid/CAP.    Time spent in minutes : 60 minutes , critical care   Jani Gravel M.D on 04/24/2019 at 9:52 PM

## 2019-05-14 NOTE — ED Notes (Signed)
XR at bedside

## 2019-05-14 NOTE — Progress Notes (Signed)
Pharmacy Antibiotic Note  Lance Mason is a 63 y.o. male admitted on 04/26/2019 with sepsis.  Pharmacy has been consulted for cefepime and vancomycin dosing. Covid + 5/21  Plan: Cefepime 2 Gm IV q8h Vancomycin 1500 mg x1 then 1 Gm IV q12h for est AUC = 504 Goal AUC = 400-550 F/u scr/cultures/levels     Temp (24hrs), Avg:101 F (38.3 C), Min:100 F (37.8 C), Max:102.5 F (39.2 C)  Recent Labs  Lab 05/08/19 1643 05/09/2019 1815 04/27/2019 2015  WBC 7.4 7.5  --   CREATININE 0.77 0.75  --   LATICACIDVEN  --   --  1.5    Estimated Creatinine Clearance: 89.8 mL/min (by C-G formula based on SCr of 0.75 mg/dL).    No Known Allergies  Antimicrobials this admission: 5/26 cefepime >>  5/26 zmax >>  5/26 vancomycin >>  Dose adjustments this admission:   Microbiology results:  BCx:   UCx:    Sputum:    MRSA PCR:   Thank you for allowing pharmacy to be a part of this patient's care.  Lorenza Evangelist 05/04/2019 10:23 PM

## 2019-05-14 NOTE — ED Notes (Signed)
Called [facility] 

## 2019-05-14 NOTE — ED Notes (Addendum)
Called pt's granddaughter Steward Drone [phone] and gave her an update on pt's medical care. Pt gave authorization to speak with her while he was at [facility] ED.

## 2019-05-14 NOTE — ED Notes (Signed)
Called report to [facility]

## 2019-05-14 NOTE — ED Triage Notes (Signed)
Per EMS, patient from home, + COVID x10 days ago. C/o worsening SOB and N/V x1 week with eating. Reports taking tylenol for fever.  20g L FA NS   BP 140/80  84% on RA.  86% on 2L. 96% on NRB.  Patient is spanish speaking.

## 2019-05-14 NOTE — ED Notes (Signed)
Bed: ZY60 Expected date:  Expected time:  Means of arrival:  Comments: EMS +covid test, increased SHOB, 84% RA, 96% non-rebreather

## 2019-05-14 NOTE — ED Notes (Signed)
Called [facility] to let Maggie know [facility] arrived to pick up patient.

## 2019-05-14 NOTE — ED Notes (Signed)
ED TO INPATIENT HANDOFF REPORT  ED Nurse Name and Phone #: Florentina Addison, RN  S Name/Age/Gender Lance Mason 63 y.o. male Room/Bed: WA23/WA23  Code Status   Code Status: Full Code  Home/SNF/Other Home Patient oriented to: AOx4 Is this baseline? Yes   Triage Complete: Triage complete  Chief Complaint COVID positive  Triage Note Per EMS, patient from home, + COVID x10 days ago. C/o worsening SOB and N/V x1 week with eating. Reports taking tylenol for fever.  20g L FA NS   BP 140/80  84% on RA.  86% on 2L. 96% on NRB.  Patient is spanish speaking.   Allergies No Known Allergies  Level of Care/Admitting Diagnosis ED Disposition    ED Disposition Condition Comment   Admit  Hospital Area: [facility] [[id number]]  Level of Care: Progressive [102]  Covid Evaluation: Confirmed COVID Positive  Isolation Risk Level: Comment  Comment: pox 84% on RA, requiring 8L per ED to keep >90%  Diagnosis: COVID-19 virus infection [[id number]]  Admitting Physician: Pearson Grippe [[id number]]  Attending Physician: Pearson Grippe [phone]  Estimated length of stay: past midnight tomorrow  Certification:: I certify this patient will need inpatient services for at least 2 midnights  PT Class (Do Not Modify): Inpatient [101]  PT Acc Code (Do Not Modify): Private [1]       B Medical/Surgery History Past Medical History:  Diagnosis Date  . Cataract   . Diabetes mellitus without complication (HCC)   . Hyperlipidemia   . Hypertension    History reviewed. No pertinent surgical history.   A IV Location/Drains/Wounds Patient Lines/Drains/Airways Status   Active Line/Drains/Airways    Name:   Placement date:   Placement time:   Site:   Days:   Peripheral IV 05/08/19 Right Forearm   05/08/19    1535    Forearm   6   Peripheral IV 2019-06-06 Left Forearm   June 06, 2019    -    Forearm   less than 1          Intake/Output Last 24 hours No intake or output data in the 24  hours ending 06/06/19 2051  Labs/Imaging Results for orders placed or performed during the hospital encounter of 06-06-2019 (from the past 48 hour(s))  CBC with Differential/Platelet     Status: Abnormal   Collection Time: Jun 06, 2019  6:15 PM  Result Value Ref Range   WBC 7.5 4.0 - 10.5 K/uL    Comment: WHITE COUNT CONFIRMED ON SMEAR   RBC 4.16 (L) 4.22 - 5.81 MIL/uL   Hemoglobin 13.2 13.0 - 17.0 g/dL   HCT 19.1 (L) 47.8 - 29.5 %   MCV 91.1 80.0 - 100.0 fL   MCH 31.7 26.0 - 34.0 pg   MCHC 34.8 30.0 - 36.0 g/dL   RDW 62.1 30.8 - 65.7 %   Platelets 210 150 - 400 K/uL   nRBC 0.0 0.0 - 0.2 %   Neutrophils Relative % 81 %   Neutro Abs 6.0 1.7 - 7.7 K/uL   Lymphocytes Relative 16 %   Lymphs Abs 1.2 0.7 - 4.0 K/uL   Monocytes Relative 3 %   Monocytes Absolute 0.2 0.1 - 1.0 K/uL   Eosinophils Relative 0 %   Eosinophils Absolute 0.0 0.0 - 0.5 K/uL   Basophils Relative 0 %   Basophils Absolute 0.0 0.0 - 0.1 K/uL   Immature Granulocytes 0 %   Abs Immature Granulocytes 0.03 0.00 - 0.07 K/uL  Comment: Performed at [facility], [address] [place], [place] [address]  Comprehensive metabolic panel     Status: Abnormal   Collection Time: 2019/05/26  6:15 PM  Result Value Ref Range   Sodium 133 (L) 135 - 145 mmol/L   Potassium 3.8 3.5 - 5.1 mmol/L   Chloride 102 98 - 111 mmol/L   CO2 23 22 - 32 mmol/L   Glucose, Bld 179 (H) 70 - 99 mg/dL   BUN 12 8 - 23 mg/dL   Creatinine, Ser 1.19 0.61 - 1.24 mg/dL   Calcium 7.8 (L) 8.9 - 10.3 mg/dL   Total Protein 7.4 6.5 - 8.1 g/dL   Albumin 2.9 (L) 3.5 - 5.0 g/dL   AST 147 (H) 15 - 41 U/L   ALT 88 (H) 0 - 44 U/L   Alkaline Phosphatase 96 38 - 126 U/L   Total Bilirubin 0.5 0.3 - 1.2 mg/dL   GFR calc non Af Amer >60 >60 mL/min   GFR calc Af Amer >60 >60 mL/min   Anion gap 8 5 - 15    Comment: Performed at [facility], [address], [place], [place] [address]  Brain natriuretic peptide     Status: None    Collection Time: May 26, 2019  6:15 PM  Result Value Ref Range   B Natriuretic Peptide 26.8 0.0 - 100.0 pg/mL    Comment: Performed at [facility], [address], [place], [place] [address]  Troponin I - ONCE - STAT     Status: None   Collection Time: May 26, 2019  6:15 PM  Result Value Ref Range   Troponin I <0.03 <0.03 ng/mL    Comment: Performed at [facility], [address] [place], [place] [address]  D-dimer, quantitative (not at [facility])     Status: Abnormal   Collection Time: 05-26-2019  6:15 PM  Result Value Ref Range   D-Dimer, Quant 3.48 (H) 0.00 - 0.50 ug/mL-FEU    Comment: (NOTE) At the manufacturer cut-off of 0.50 ug/mL FEU, this assay has been documented to exclude PE with a sensitivity and negative predictive value of 97 to 99%.  At this time, this assay has not been approved by the FDA to exclude DVT/VTE. Results should be correlated with clinical presentation. Performed at [facility], [address], [place], [place] [address]   Lactate dehydrogenase     Status: Abnormal   Collection Time: 2019-05-26  6:15 PM  Result Value Ref Range   LDH 425 (H) 98 - 192 U/L    Comment: Performed at [facility], [address] [place], [place] [address]  Procalcitonin     Status: None   Collection Time: 05/26/19  6:15 PM  Result Value Ref Range   Procalcitonin 0.92 ng/mL    Comment:        Interpretation: PCT > 0.5 ng/mL and <= 2 ng/mL: Systemic infection (sepsis) is possible, but other conditions are known to elevate PCT as well. (NOTE)       Sepsis PCT Algorithm           Lower Respiratory Tract                                      Infection PCT Algorithm    ----------------------------     ----------------------------         PCT <  0.25 ng/mL                PCT < 0.10 ng/mL         Strongly encourage             Strongly discourage   discontinuation of antibiotics    initiation of antibiotics     ----------------------------     -----------------------------       PCT 0.25 - 0.50 ng/mL            PCT 0.10 - 0.25 ng/mL               OR       >80% decrease in PCT            Discourage initiation of                                            antibiotics      Encourage discontinuation           of antibiotics    ----------------------------     -----------------------------         PCT >= 0.50 ng/mL              PCT 0.26 - 0.50 ng/mL                AND       <80% decrease in PCT             Encourage initiation of                                             antibiotics       Encourage continuation           of antibiotics    ----------------------------     -----------------------------        PCT >= 0.50 ng/mL                  PCT > 0.50 ng/mL               AND         increase in PCT                  Strongly encourage                                      initiation of antibiotics    Strongly encourage escalation           of antibiotics                                     -----------------------------                                           PCT <= 0.25 ng/mL  OR                                        > 80% decrease in PCT                                     Discontinue / Do not initiate                                             antibiotics Performed at [facility], [address], [place], [place] [address]    Dg Chest Port 1 View  Result Date: May 23, 2019 CLINICAL DATA:  63 year old male with history of worsening shortness of breath. Diagnosed with COVID-19 10 days ago. EXAM: PORTABLE CHEST 1 VIEW COMPARISON:  Chest x-ray 05/08/2019. FINDINGS: Low lung volumes. Calcified pleural plaques throughout the thorax bilaterally again noted. Mild diffuse interstitial prominence and patchy ill-defined airspace disease throughout the mid to lower lungs bilaterally (left greater than right). No pleural effusions. No  evidence of pulmonary edema. Heart size is borderline enlarged, accentuated by low lung volumes and portable AP technique. Upper mediastinal contours are within normal limits. IMPRESSION: 1. Findings are compatible with multilobar pneumonia, as above. 2. Asbestos related pleural disease redemonstrated. Electronically Signed   By: Trudie Reed M.D.   On: 2019-05-23 18:49    Pending Labs Unresulted Labs (From admission, onward)    Start     Ordered   05/21/19 0500  Creatinine, serum  (enoxaparin (LOVENOX)    CrCl >/= 30 ml/min)  Weekly,   R    Comments:  while on enoxaparin therapy    2019-05-23 1936   05/15/19 0500  CBC with Differential/Platelet  Daily,   R     23-May-2019 1936   05/15/19 0500  Comprehensive metabolic panel  Daily,   R     23-May-2019 1936   05/15/19 0500  CK  Daily,   R     May 23, 2019 1936   05/15/19 0500  C-reactive protein  Daily,   R     05/23/2019 1936   05/15/19 0500  Magnesium  Daily,   R     05-23-19 1936   05/15/19 0500  Phosphorus  Daily,   R     May 23, 2019 1936   05/15/19 0500  Interleukin-6, Plasma  Daily,   R     05/23/19 1936   05/15/19 0500  Ferritin  Daily,   R     2019/05/23 1936   2019/05/23 1931  Interleukin-6, Plasma  Once,   R     May 23, 2019 1936   May 23, 2019 1931  Sedimentation rate  Once,   R     23-May-2019 1936   05/23/19 1931  Triglycerides  Once,   R     23-May-2019 1936   05-23-2019 1931  Troponin I - Now Then Q6H  Now then every 6 hours,   R     May 23, 2019 1936   2019/05/23 1931  CK total and CKMB (cardiac)not at [facility]  Once,   R     05/23/19 1936   2019-05-23 1931  Hepatitis panel, acute  Once,   R     May 23, 2019 1936   2019/05/23 1930  Type and screen [facility]  Once,   R    Comments:  [facility]    05/02/2019 1936   04/28/2019 1930  C-reactive protein  Once,   R     05/13/2019 1936   04/19/2019 1930  Ferritin  Once,   R     05/13/2019 1936   05/07/2019 1928  HIV antibody (Routine Testing)  Once,   R     04/27/2019 1936   04/30/2019  1928  ABO/Rh  Once,   R     05/08/2019 1936   04/26/2019 1919  Lactic acid, plasma  ONCE - STAT,   STAT     04/28/2019 1918   04/26/2019 1918  Culture, blood (Routine X 2) w Reflex to ID Panel  BLOOD CULTURE X 2,   STAT     04/25/2019 1918          Vitals/Pain Today's Vitals   04/19/2019 1900 05/17/2019 1930 05/01/2019 2000 05/04/2019 2030  BP: (!) 109/94 134/88 (!) 146/95 (!) 146/89  Pulse: (!) 123 (!) 124 (!) 127 (!) 126  Resp: (!) 26 (!) 34 (!) 36 (!) 37  Temp:    (!) 102.5 F (39.2 C)  TempSrc:    Oral  SpO2: 94% 93% 94% 93%    Isolation Precautions Airborne and Contact precautions  Medications Medications  0.9 %  sodium chloride infusion (has no administration in time range)  acetaminophen (TYLENOL) tablet 650 mg (has no administration in time range)  enoxaparin (LOVENOX) injection 40 mg (has no administration in time range)  insulin aspart (novoLOG) injection 0-9 Units (has no administration in time range)  insulin aspart (novoLOG) injection 0-5 Units (has no administration in time range)  ondansetron (ZOFRAN) injection 4 mg (4 mg Intravenous Given 05/15/2019 1825)  sodium chloride 0.9 % bolus 500 mL (500 mLs Intravenous New Bag/Given 05/02/2019 2031)    Mobility walks High fall risk   Focused Assessments Pulmonary Assessment Handoff:  Lung sounds: Bilateral Breath Sounds: Diminished L Breath Sounds: Diminished R Breath Sounds: Diminished O2 Device: Room Air O2 Flow Rate (L/min): 8 L/min      R Recommendations: See Admitting Provider Note  Report given to:   Additional Notes: NA

## 2019-05-15 ENCOUNTER — Inpatient Hospital Stay (HOSPITAL_COMMUNITY): Payer: BLUE CROSS/BLUE SHIELD

## 2019-05-15 DIAGNOSIS — J189 Pneumonia, unspecified organism: Secondary | ICD-10-CM

## 2019-05-15 DIAGNOSIS — U071 COVID-19: Secondary | ICD-10-CM

## 2019-05-15 DIAGNOSIS — J9601 Acute respiratory failure with hypoxia: Secondary | ICD-10-CM

## 2019-05-15 LAB — GLUCOSE, CAPILLARY
Glucose-Capillary: 132 mg/dL — ABNORMAL HIGH (ref 70–99)
Glucose-Capillary: 149 mg/dL — ABNORMAL HIGH (ref 70–99)
Glucose-Capillary: 158 mg/dL — ABNORMAL HIGH (ref 70–99)
Glucose-Capillary: 164 mg/dL — ABNORMAL HIGH (ref 70–99)
Glucose-Capillary: 212 mg/dL — ABNORMAL HIGH (ref 70–99)

## 2019-05-15 LAB — CBC WITH DIFFERENTIAL/PLATELET
Abs Immature Granulocytes: 0.05 10*3/uL (ref 0.00–0.07)
Basophils Absolute: 0 10*3/uL (ref 0.0–0.1)
Basophils Relative: 0 %
Eosinophils Absolute: 0 10*3/uL (ref 0.0–0.5)
Eosinophils Relative: 0 %
HCT: 39.6 % (ref 39.0–52.0)
Hemoglobin: 13.2 g/dL (ref 13.0–17.0)
Immature Granulocytes: 1 %
Lymphocytes Relative: 21 %
Lymphs Abs: 1.6 10*3/uL (ref 0.7–4.0)
MCH: 30.6 pg (ref 26.0–34.0)
MCHC: 33.3 g/dL (ref 30.0–36.0)
MCV: 91.7 fL (ref 80.0–100.0)
Monocytes Absolute: 0.3 10*3/uL (ref 0.1–1.0)
Monocytes Relative: 4 %
Neutro Abs: 5.4 10*3/uL (ref 1.7–7.7)
Neutrophils Relative %: 74 %
Platelets: 245 10*3/uL (ref 150–400)
RBC: 4.32 MIL/uL (ref 4.22–5.81)
RDW: 12.8 % (ref 11.5–15.5)
WBC: 7.3 10*3/uL (ref 4.0–10.5)
nRBC: 0 % (ref 0.0–0.2)

## 2019-05-15 LAB — POCT I-STAT 7, (LYTES, BLD GAS, ICA,H+H)
Acid-base deficit: 3 mmol/L — ABNORMAL HIGH (ref 0.0–2.0)
Bicarbonate: 20.7 mmol/L (ref 20.0–28.0)
Calcium, Ion: 1.08 mmol/L — ABNORMAL LOW (ref 1.15–1.40)
HCT: 36 % — ABNORMAL LOW (ref 39.0–52.0)
Hemoglobin: 12.2 g/dL — ABNORMAL LOW (ref 13.0–17.0)
O2 Saturation: 91 %
Patient temperature: 101.3
Potassium: 3.6 mmol/L (ref 3.5–5.1)
Sodium: 138 mmol/L (ref 135–145)
TCO2: 22 mmol/L (ref 22–32)
pCO2 arterial: 32.8 mmHg (ref 32.0–48.0)
pH, Arterial: 7.414 (ref 7.350–7.450)
pO2, Arterial: 63 mmHg — ABNORMAL LOW (ref 83.0–108.0)

## 2019-05-15 LAB — COMPREHENSIVE METABOLIC PANEL
ALT: 79 U/L — ABNORMAL HIGH (ref 0–44)
AST: 106 U/L — ABNORMAL HIGH (ref 15–41)
Albumin: 2.7 g/dL — ABNORMAL LOW (ref 3.5–5.0)
Alkaline Phosphatase: 100 U/L (ref 38–126)
Anion gap: 9 (ref 5–15)
BUN: 10 mg/dL (ref 8–23)
CO2: 25 mmol/L (ref 22–32)
Calcium: 7.8 mg/dL — ABNORMAL LOW (ref 8.9–10.3)
Chloride: 102 mmol/L (ref 98–111)
Creatinine, Ser: 0.68 mg/dL (ref 0.61–1.24)
GFR calc Af Amer: >60 mL/min (ref >60)
GFR calc non Af Amer: >60 mL/min (ref >60)
Glucose, Bld: 146 mg/dL — ABNORMAL HIGH (ref 70–99)
Potassium: 4 mmol/L (ref 3.5–5.1)
Sodium: 136 mmol/L (ref 135–145)
Total Bilirubin: 0.5 mg/dL (ref 0.3–1.2)
Total Protein: 7.3 g/dL (ref 6.5–8.1)

## 2019-05-15 LAB — ABO/RH
ABO/RH(D): O POS
ABO/RH(D): O POS

## 2019-05-15 LAB — FERRITIN
Ferritin: 1538 ng/mL — ABNORMAL HIGH (ref 24–336)
Ferritin: 1910 ng/mL — ABNORMAL HIGH (ref 24–336)

## 2019-05-15 LAB — PHOSPHORUS: Phosphorus: 2.4 mg/dL — ABNORMAL LOW (ref 2.5–4.6)

## 2019-05-15 LAB — TYPE AND SCREEN
ABO/RH(D): O POS
Antibody Screen: NEGATIVE

## 2019-05-15 LAB — C-REACTIVE PROTEIN: CRP: 33.3 mg/dL — ABNORMAL HIGH (ref <1.0)

## 2019-05-15 LAB — INTERLEUKIN-6, PLASMA: Interleukin-6, Plasma: 145.2 pg/mL — ABNORMAL HIGH (ref 0.0–12.2)

## 2019-05-15 LAB — STREP PNEUMONIAE URINARY ANTIGEN: Strep Pneumo Urinary Antigen: NEGATIVE

## 2019-05-15 LAB — HEPATITIS PANEL, ACUTE
HCV Ab: <0.1 s/co ratio (ref 0.0–0.9)
Hep A IgM: NEGATIVE
Hep B C IgM: NEGATIVE
Hepatitis B Surface Ag: NEGATIVE

## 2019-05-15 LAB — MRSA PCR SCREENING: MRSA by PCR: NEGATIVE

## 2019-05-15 LAB — TROPONIN I: Troponin I: <0.03 ng/mL (ref <0.03)

## 2019-05-15 LAB — MAGNESIUM: Magnesium: 2.2 mg/dL (ref 1.7–2.4)

## 2019-05-15 LAB — HIV ANTIBODY (ROUTINE TESTING W REFLEX): HIV Screen 4th Generation wRfx: NONREACTIVE

## 2019-05-15 LAB — CK: Total CK: 74 U/L (ref 49–397)

## 2019-05-15 MED ORDER — ENSURE MAX PROTEIN PO LIQD
11.0000 [oz_av] | Freq: Two times a day (BID) | ORAL | Status: DC
  Administered 2019-05-15 (×2): 11 [oz_av] via ORAL
  Filled 2019-05-15 (×7): qty 330

## 2019-05-15 MED ORDER — VANCOMYCIN HCL IN DEXTROSE 1-5 GM/200ML-% IV SOLN
1000.0000 mg | Freq: Two times a day (BID) | INTRAVENOUS | Status: DC
  Administered 2019-05-15 – 2019-05-16 (×3): 1000 mg via INTRAVENOUS
  Filled 2019-05-15 (×3): qty 200

## 2019-05-15 MED ORDER — SODIUM CHLORIDE 0.9 % IV SOLN
200.0000 mg | Freq: Once | INTRAVENOUS | Status: AC
  Administered 2019-05-15: 200 mg via INTRAVENOUS
  Filled 2019-05-15: qty 40

## 2019-05-15 MED ORDER — TOCILIZUMAB 400 MG/20ML IV SOLN
800.0000 mg | Freq: Once | INTRAVENOUS | Status: AC
  Administered 2019-05-15: 800 mg via INTRAVENOUS
  Filled 2019-05-15: qty 40

## 2019-05-15 MED ORDER — ADULT MULTIVITAMIN W/MINERALS CH
1.0000 | ORAL_TABLET | Freq: Every day | ORAL | Status: DC
  Administered 2019-05-15 – 2019-05-16 (×2): 1 via ORAL
  Filled 2019-05-15 (×3): qty 1

## 2019-05-15 MED ORDER — ENOXAPARIN SODIUM 40 MG/0.4ML ~~LOC~~ SOLN
40.0000 mg | SUBCUTANEOUS | Status: DC
  Administered 2019-05-15 – 2019-05-16 (×2): 40 mg via SUBCUTANEOUS
  Filled 2019-05-15 (×2): qty 0.4

## 2019-05-15 MED ORDER — IOHEXOL 350 MG/ML SOLN
100.0000 mL | Freq: Once | INTRAVENOUS | Status: AC | PRN
  Administered 2019-05-15: 100 mL via INTRAVENOUS

## 2019-05-15 MED ORDER — FUROSEMIDE 10 MG/ML IJ SOLN
20.0000 mg | Freq: Once | INTRAMUSCULAR | Status: AC
  Administered 2019-05-15: 20 mg via INTRAVENOUS
  Filled 2019-05-15: qty 2

## 2019-05-15 MED ORDER — DM-GUAIFENESIN ER 30-600 MG PO TB12
1.0000 | ORAL_TABLET | Freq: Two times a day (BID) | ORAL | Status: DC
  Administered 2019-05-15 – 2019-05-18 (×4): 1 via ORAL
  Filled 2019-05-15 (×7): qty 1

## 2019-05-15 MED ORDER — SODIUM CHLORIDE 0.9 % IV SOLN
100.0000 mg | INTRAVENOUS | Status: AC
  Administered 2019-05-16 – 2019-05-19 (×4): 100 mg via INTRAVENOUS
  Filled 2019-05-15 (×4): qty 20

## 2019-05-15 NOTE — Progress Notes (Signed)
PROGRESS NOTE    Lucciano Saya [place]  PPH:[id number] DOB: [birth year] DOA: 05/13/2019 PCP: Patient, No Pcp Per    Brief Narrative:  63 year old male with a history of diabetes, hyperlipidemia, admitted to the hospital with worsening respiratory failure secondary to COVID-19 pneumonia.  Currently on nonrebreather mask, he is being monitored in the ICU.  He received dose of Actemra on 5/27 and was started on a course of Remdesivir.   Assessment & Plan:   Principal Problem:   Acute respiratory failure with hypoxia (HCC) Active Problems:   Type 2 diabetes mellitus without complication, without long-term current use of insulin (HCC)   Pure hypercholesterolemia   COVID-19 virus infection   CAP (community acquired pneumonia)   1. Acute respiratory failure with hypoxia secondary to COVID-19 pneumonia/community-acquired pneumonia.  Patient is currently on nonrebreather mask.  ABG check showed PO2 63.  Patient is mentating at this time.  He does have elevated respiratory rate, but is able to speak in full sentences.  We will moved to ICU for closer monitoring.  May benefit from heated high flow oxygen.  Continue nonrebreather mask for now.  After explaining risks and benefits and noting that he does not have any contraindications, patient received a dose of Actemra.  He is also being started on a course of Remdesivir.  We will give 1 dose of IV Lasix in order to keep net fluid balance negative.  Continue to follow inflammatory markers.  Procalcitonin mildly elevated at 0.9.  He was started on broad-spectrum antibiotics.  Cultures are in process.  Check MRSA PCR and if negative, can likely discontinue vancomycin  The treatment plan and use of medications and known side effects were discussed with patient/family, they were clearly explained that there is no proven definitive treatment for COVID-19 infection, any medications used here are based on published clinical articles/anecdotal data which are not  peer-reviewed or randomized control trials.  Complete risks and long-term side effects are unknown, however in the best clinical judgment they seem to be of some clinical benefit rather than medical risks.  Patient/family agree with the treatment plan and want to receive the given medications.  2. Diabetes.  Chronically on metformin and glipizide.  These are currently on hold.  Continue on sliding scale insulin.  Blood sugar stable.   DVT prophylaxis: Lovenox Code Status: Full code Family Communication: Discussed with granddaughter over the phone Disposition Plan: Moved to ICU for closer monitoring.   Consultants:     Procedures:     Antimicrobials:   Vancomycin 5/26 >  Cefepime 5/26 >  Azithromycin 5/26 >   Subjective: Patient says his breathing is feeling a little better today.  He does have occasional cough.  Visit was performed with assistance of interpreter service.  Objective: Vitals:   05/15/19 1047 05/15/19 1048 05/15/19 1050 05/15/19 1145  BP: 136/79 136/79    Pulse:  (!) 116    Resp:  (!) 28    Temp:   98.6 F (37 C) 100 F (37.8 C)  TempSrc:   Oral Oral  SpO2:  (!) 89%    Weight:      Height:        Intake/Output Summary (Last 24 hours) at 05/15/2019 1215 Last data filed at 05/15/2019 1152 Gross per 24 hour  Intake 652.95 ml  Output 425 ml  Net 227.95 ml   Filed Weights   04/22/2019 2354  Weight: 105.5 kg    Examination:  General exam: Appears calm, able to carry on  conversation Respiratory system: scattered rhonchi. Increased respiratory rate/effort. Cardiovascular system: S1 & S2 heard, tachycardic. No JVD, murmurs, rubs, gallops or clicks. No pedal edema. Gastrointestinal system: Abdomen is nondistended, soft and nontender. No organomegaly or masses felt. Normal bowel sounds heard. Central nervous system: Alert and oriented. No focal neurological deficits. Extremities: Symmetric 5 x 5 power. Skin: No rashes, lesions or ulcers Psychiatry:  Judgement and insight appear normal. Mood & affect appropriate.     Data Reviewed: I have personally reviewed following labs and imaging studies  CBC: Recent Labs  Lab 05/08/19 1643 05/05/2019 1815 05/15/19 0455 05/15/19 0911  WBC 7.4 7.5 7.3  --   NEUTROABS 4.9 6.0 5.4  --   HGB 14.2 13.2 13.2 12.2*  HCT 41.6 37.9* 39.6 36.0*  MCV 92.4 91.1 91.7  --   PLT 143* 210 245  --    Basic Metabolic Panel: Recent Labs  Lab 05/08/19 1643 04/26/2019 1815 05/15/19 0455 05/15/19 0911  NA 136 133* 136 138  K 4.3 3.8 4.0 3.6  CL 103 102 102  --   CO2 21* 23 25  --   GLUCOSE 224* 179* 146*  --   BUN --   CREATININE 0.77 0.75 0.68  --   CALCIUM 8.2* 7.8* 7.8*  --   MG  --   --  2.2  --   PHOS  --   --  2.4*  --    GFR: Estimated Creatinine Clearance: 105.7 mL/min (by C-G formula based on SCr of 0.68 mg/dL). Liver Function Tests: Recent Labs  Lab 05/08/19 1643 05/16/2019 1815 05/15/19 0455  AST 28 114* 106*  ALT 35 88* 79*  ALKPHOS 65 96 100  BILITOT 0.7 0.5 0.5  PROT 6.8 7.4 7.3  ALBUMIN 3.3* 2.9* 2.7*   No results for input(s): LIPASE, AMYLASE in the last 168 hours. No results for input(s): AMMONIA in the last 168 hours. Coagulation Profile: No results for input(s): INR, PROTIME in the last 168 hours. Cardiac Enzymes: Recent Labs  Lab 05/09/2019 1815 05/11/2019 2018 05/15/19 0450 05/15/19 0455  CKTOTAL  --  62  --  74  CKMB  --  1.7  --   --   TROPONINI <0.03  --  <0.03  --    BNP (last 3 results) No results for input(s): PROBNP in the last 8760 hours. HbA1C: No results for input(s): HGBA1C in the last 72 hours. CBG: Recent Labs  Lab 05/08/19 1532 05/08/19 1610 05/15/19 0020 05/15/19 0737 05/15/19 1142  GLUCAP 210* 198* 149* 132* 158*   Lipid Profile: Recent Labs    05/12/2019 2018  TRIG 267*   Thyroid Function Tests: No results for input(s): TSH, T4TOTAL, FREET4, T3FREE, THYROIDAB in the last 72 hours. Anemia Panel: Recent Labs    05/05/2019  2018 05/15/19 0455  FERRITIN 1,538* 1,910*   Sepsis Labs: Recent Labs  Lab 04/23/2019 1815 05/13/2019 2015  PROCALCITON 0.92  --   LATICACIDVEN  --  1.5    Recent Results (from the past 240 hour(s))  Novel Coronavirus,NAA,(SEND-OUT TO REF LAB - TAT 24-48 hrs); Hosp Order     Status: Abnormal   Collection Time: 05/08/19  4:43 PM  Result Value Ref Range Status   SARS-CoV-2, NAA DETECTED (A) NOT DETECTED Final    Comment: CRITICAL RESULT CALLED TO, READ BACK BY AND VERIFIED WITH: Vernard Gambles, NCED RN, AT 1625 ON 05/09/19 BY C. JESSUP, MLT. (NOTE) This test was developed and its performance characteristics determined  by [facility]. This test has not been FDA cleared or approved. This test has been authorized by FDA under an Emergency Use Authorization (EUA). This test is only authorized for the duration of time the declaration that circumstances exist justifying the authorization of the emergency use of in vitro diagnostic tests for detection of SARS-CoV-2 virus and/or diagnosis of COVID-19 infection under section 564(b)(1) of the Act, 21 U.S.C. 161WRU-0(A)(5360bbb-3(b)(1), unless the authorization is terminated or revoked sooner. When diagnostic testing is negative, the possibility of a false negative result should be considered in the context of a patient's recent exposures and the presence of clinical signs and symptoms consistent with COVID-19. An individual without symptoms of COVID-19  and who is not shedding SARS-CoV-2 virus would expect to have a negative (not detected) result in this assay. Performed At: [facility] [address] [place], [place] [id number] Jolene SchimkeNagendra Sanjai MD NW:[id number]:[phone]    Coronavirus Source NASOPHARYNGEAL  Final    Comment: Performed at [facility], [address] [place], [place] [address]  Culture, blood (Routine X 2) w Reflex to ID Panel     Status: None (Preliminary result)   Collection Time: 04/23/2019  8:18 PM  Result Value Ref  Range Status   Specimen Description   Final    BLOOD BLOOD LEFT FOREARM Performed at [facility], [address] [place], [place] [address]    Special Requests   Final    BOTTLES DRAWN AEROBIC AND ANAEROBIC Blood Culture adequate volume Performed at [facility], [address] [place], [place] [address]    Culture   Final    NO GROWTH < 12 HOURS Performed at [facility], [address] [place], [place] [address]    Report Status PENDING  Incomplete         Radiology Studies: Ct Angio Chest Pe W Or Wo Contrast  Result Date: 05/15/2019 CLINICAL DATA:  Shortness of breath and positive COVID-19 EXAM: CT ANGIOGRAPHY CHEST WITH CONTRAST TECHNIQUE: Multidetector CT imaging of the chest was performed using the standard protocol during bolus administration of intravenous contrast. Multiplanar CT image reconstructions and MIPs were obtained to evaluate the vascular anatomy. CONTRAST:  100mL OMNIPAQUE IOHEXOL 350 MG/ML SOLN COMPARISON:  Chest x-ray from previous day. FINDINGS: Cardiovascular: Thoracic aorta is within normal limits. No dissection or aneurysmal dilatation is seen. No cardiac enlargement is noted. No coronary calcifications are seen. Pulmonary artery is well visualized without evidence of pulmonary embolism. Mediastinum/Nodes: Esophagus is within normal limits. Thoracic inlet is unremarkable. Scattered lymph nodes are noted throughout the mediastinum. Largest of these lies in the subcarinal region measuring just under 16 mm. No definitive hilar adenopathy is noted. Lungs/Pleura: Lungs demonstrate bilateral ground-glass infiltrative changes throughout both lungs consistent with the given clinical history of COVID-19 positivity. Bilateral pleural calcifications are noted. No sizable effusion is seen. No parenchymal nodules are noted. Upper Abdomen: Fatty liver is noted. Musculoskeletal: Degenerative changes of the thoracic spine are noted. No  acute bony abnormality is seen. Review of the MIP images confirms the above findings. IMPRESSION: Diffuse bilateral ground-glass infiltrates consistent with the known history of COVID-19 positivity. No evidence of pulmonary emboli. Scattered mediastinal lymphadenopathy as described. Fatty liver. Electronically Signed   By: Alcide CleverMark  Lukens M.D.   On: 05/15/2019 01:41   Dg Chest Port 1 View  Result Date: 05/16/2019 CLINICAL DATA:  63 year old male with history of worsening shortness of breath. Diagnosed with COVID-19 10 days ago. EXAM: PORTABLE  CHEST 1 VIEW COMPARISON:  Chest x-ray 05/08/2019. FINDINGS: Low lung volumes. Calcified pleural plaques throughout the thorax bilaterally again noted. Mild diffuse interstitial prominence and patchy ill-defined airspace disease throughout the mid to lower lungs bilaterally (left greater than right). No pleural effusions. No evidence of pulmonary edema. Heart size is borderline enlarged, accentuated by low lung volumes and portable AP technique. Upper mediastinal contours are within normal limits. IMPRESSION: 1. Findings are compatible with multilobar pneumonia, as above. 2. Asbestos related pleural disease redemonstrated. Electronically Signed   By: Trudie Reed M.D.   On: May 27, 2019 18:49        Scheduled Meds: . dextromethorphan-guaiFENesin  1 tablet Oral BID  . enoxaparin (LOVENOX) injection  40 mg Subcutaneous Q24H  . furosemide  20 mg Intravenous Once  . gabapentin  300 mg Oral TID  . insulin aspart  0-5 Units Subcutaneous QHS  . insulin aspart  0-9 Units Subcutaneous TID WC  . multivitamin with minerals  1 tablet Oral Daily  . Ensure Max Protein  11 oz Oral BID   Continuous Infusions: . azithromycin 250 mL/hr at 05/15/19 1000  . ceFEPime (MAXIPIME) IV 200 mL/hr at 05/15/19 1000  . remdesivir 200 mg in NS 250 mL     Followed by  . [START ON 05/16/2019] remdesivir 100 mg in NS 250 mL    . vancomycin 1,000 mg (05/15/19 1215)     LOS: 1 day     Critical care Time spent: , spent coordinating care, discussing with patient/family, explaining risks/benefits of medications and discussing care with CCM    Erick Blinks, MD [facility]   If 7PM-7AM, please contact night-coverage [email or web address]  05/15/2019, 12:15 PM

## 2019-05-15 NOTE — Progress Notes (Signed)
Initial Nutrition Assessment RD working remotely.  DOCUMENTATION CODES:   Obesity unspecified  INTERVENTION:   Ensure Max po BID, each supplement provides 150 kcal and 30 grams of protein.   Multivitamin daily.  Pt receiving Hormel Shake daily with Breakfast which provides 520 kcals and 22 g of protein and Magic cup BID with lunch and dinner, each supplement provides 290 kcal and 9 grams of protein, automatically on meal trays to optimize nutritional intake.    NUTRITION DIAGNOSIS:   Inadequate oral intake related to decreased appetite, acute illness(COVID) as evidenced by per patient/family report.  GOAL:   Patient will meet greater than or equal to 90% of their needs  MONITOR:   PO intake, Supplement acceptance  REASON FOR ASSESSMENT:   Malnutrition Screening Tool    ASSESSMENT:   63 yo Spanish speaking male with PMH of DM, HTN, HLD who was admitted with SOB and cough x 1 week. COVID-19 positive on 5/21.  Patient reported poor intake r/t decreased appetite on admission. Unsure of any weight loss. Suspect intake has been poor due to acute illness (COVID) with SOB. Patient is at risk for inadequate nutrition intake d/t COVID-19 diagnosis with poor intake PTA.   Labs reviewed. Phosphorus 2.4 (L) CBG's: 149-132  Medications reviewed and include Novolog.   NUTRITION - FOCUSED PHYSICAL EXAM:  deferred  Diet Order:   Diet Order            Diet Carb Modified Fluid consistency: Thin; Room service appropriate? Yes  Diet effective now              EDUCATION NEEDS:   No education needs have been identified at this time  Skin:  Skin Assessment: Reviewed RN Assessment  Last BM:  5/25  Height:   Ht Readings from Last 1 Encounters:  06-02-19 5\' 5"  (1.651 m)    Weight:   Wt Readings from Last 1 Encounters:  06-02-2019 105.5 kg    Ideal Body Weight:  61.8 kg  BMI:  Body mass index is 38.7 kg/m.  Estimated Nutritional Needs:   Kcal:   1900-2100  Protein:  100-115 gm  Fluid:  2 L    Joaquin Courts, RD, LDN, CNSC Pager [phone] After Hours Pager [phone]

## 2019-05-15 NOTE — Progress Notes (Signed)
Pt transferred to ICU do to increase O2 demand, report given to [place].  Pt's granddaughter notified of pt's transfer to the ICU and explained of the reasoning of transfer..  She asked appropriate questions and understood.

## 2019-05-15 NOTE — Consult Note (Signed)
NAME:  Lance Mason, MRN:  [id number], DOB:  12-22-1955, LOS: 1 ADMISSION DATE:  05/08/2019, CONSULTATION DATE:  05/15/2019 REFERRING MD:  Dr. Roderic Palau, CHIEF COMPLAINT:  Dyspnea   Brief History   63 y/o male with DM2, HTN admitted on 5/26 with acute on chronic respiratory failure with hypoxemia due to COVID 19 pneumonia.  History of present illness   63 year old male with a past medical history significant for hypertension and hyperlipidemia was admitted to our facility on May 14, 2019 in the setting of worsening shortness of breath.  He said that he has developed increasing shortness of breath and cough with some mucus production for about a week.  He has smoked intermittently over the years but he says he does not smoke every day.  He says that he was diagnosed with coronavirus approximately 5 days prior to admission.  After admission he was noted to be hypoxemic.  He was treated with a course of Actemra and started on Remdesivir.  Because of increasing oxygen requirements pulmonary and critical care medicine was consulted for close monitoring.  He has been moved to the intensive care unit.  Currently he is breathing with a 100% nonrebreather mask.  He says he feels like his breathing may be getting better.  Past Medical History  Hypertension Hyperlipidemia DM2  Significant Hospital Events   5/26 admission  Consults:  Pulmonary   Procedures:    Significant Diagnostic Tests:  CT angiogram chest 5/27 > diffuse bilateral patchy airspace disease with groundglass upper lobes predominant, no pulmonary embolism  Micro Data:  5/21 SARS-COV-2 > positive  Antimicrobials:  5/26 blood culture>   Interim history/subjective:    Objective   Blood pressure 139/85, pulse 94, temperature 98.6 F (37 C), temperature source Oral, resp. rate (!) 34, height '5\' 5"'  (1.651 m), weight 105.5 kg, SpO2 95 %.        Intake/Output Summary (Last 24 hours) at 05/15/2019 1730 Last data filed at  05/15/2019 1400 Gross per 24 hour  Intake 973.38 ml  Output 825 ml  Net 148.38 ml   Filed Weights   04/25/2019 2354  Weight: 105.5 kg    Examination:  General:  Resting comfortably in bed HENT: NCAT OP clear PULM: Crackles in bases B, normal effort CV: RRR, no mgr GI: BS+, soft, nontender MSK: normal bulk and tone Neuro: awake, alert, no distress, MAEW   Resolved Hospital Problem list     Assessment & Plan:  Acute coronavirus pneumonia with acute respiratory failure with hypoxemia Currently breathing comfortably on 100% nonrebreather, O2 saturation is acceptable Monitor in intensive care unit environment as he is at high risk for ventilatory failure When considering whether or not to intubate, focus more on ventilatory failure i.e. nasal flaring, accessory muscle use, paradoxical breathing.  If mental status change then would likely need intubation Continue using Remdesivir per protocol Continue Actemra Hold steroid for now with DM2  Mediastinal lymphadenopathy: likely reactive > will need f/u CT in 3 months  Best practice:  Per [facility]  Labs   CBC: Recent Labs  Lab 04/24/2019 1815 05/15/19 0455 05/15/19 0911  WBC 7.5 7.3  --   NEUTROABS 6.0 5.4  --   HGB 13.2 13.2 12.2*  HCT 37.9* 39.6 36.0*  MCV 91.1 91.7  --   PLT 210 245  --     Basic Metabolic Panel: Recent Labs  Lab 04/27/2019 1815 05/15/19 0455 05/15/19 0911  NA 133* 136 138  K 3.8 4.0 3.6  CL  102 102  --   CO2 23 25  --   GLUCOSE 179* 146*  --   BUN 12 10  --   CREATININE 0.75 0.68  --   CALCIUM 7.8* 7.8*  --   MG  --  2.2  --   PHOS  --  2.4*  --    GFR: Estimated Creatinine Clearance: 105.7 mL/min (by C-G formula based on SCr of 0.68 mg/dL). Recent Labs  Lab 04/20/2019 1815 05/05/2019 2015 05/15/19 0455  PROCALCITON 0.92  --   --   WBC 7.5  --  7.3  LATICACIDVEN  --  1.5  --     Liver Function Tests: Recent Labs  Lab 04/30/2019 1815 05/15/19 0455  AST 114* 106*  ALT 88* 79*  ALKPHOS  96 100  BILITOT 0.5 0.5  PROT 7.4 7.3  ALBUMIN 2.9* 2.7*   No results for input(s): LIPASE, AMYLASE in the last 168 hours. No results for input(s): AMMONIA in the last 168 hours.  ABG    Component Value Date/Time   PHART 7.414 05/15/2019 0911   PCO2ART 32.8 05/15/2019 0911   PO2ART 63.0 (L) 05/15/2019 0911   HCO3 20.7 05/15/2019 0911   TCO2 22 05/15/2019 0911   ACIDBASEDEF 3.0 (H) 05/15/2019 0911   O2SAT 91.0 05/15/2019 0911     Coagulation Profile: No results for input(s): INR, PROTIME in the last 168 hours.  Cardiac Enzymes: Recent Labs  Lab 05/10/2019 1815 05/09/2019 2018 05/15/19 0450 05/15/19 0455  CKTOTAL  --  62  --  74  CKMB  --  1.7  --   --   TROPONINI <0.03  --  <0.03  --     HbA1C: Hemoglobin A1C  Date/Time Value Ref Range Status  06/19/2018 04:01 PM 9.1 (A) 4.0 - 5.6 % Final  01/02/2017 06:51 PM 12.8  Final    CBG: Recent Labs  Lab 05/15/19 0020 05/15/19 0737 05/15/19 1142 05/15/19 1609  GLUCAP 149* 132* 158* 212*    Review of Systems:   Gen: + fever, no chills, + fatigue HEENT: Denies blurred vision, double vision, hearing loss, tinnitus, sinus congestion, rhinorrhea, sore throat, neck stiffness, dysphagia PULM: per HPI CV: Denies chest pain, edema, orthopnea, paroxysmal nocturnal dyspnea, palpitations GI: Denies abdominal pain, nausea, vomiting, diarrhea, hematochezia, melena, constipation, change in bowel habits GU: Denies dysuria, hematuria, polyuria, oliguria, urethral discharge Endocrine: Denies hot or cold intolerance, polyuria, polyphagia or appetite change Derm: Denies rash, dry skin, scaling or peeling skin change Heme: Denies easy bruising, bleeding, bleeding gums Neuro: Denies headache, numbness, weakness, slurred speech, loss of memory or consciousness   Past Medical History  He,  has a past medical history of Cataract, Diabetes mellitus without complication ([place]), Hyperlipidemia, Hypertension, and Pleural plaque (10/05/2015).    Surgical History   History reviewed. No pertinent surgical history.   Social History   reports that he quit smoking about 24 years ago. His smoking use included cigarettes. He has a 2.50 pack-year smoking history. He has never used smokeless tobacco. He reports current alcohol use. He reports that he does not use drugs.   Family History   His family history includes Heart disease in his father and mother; Hypertension in his mother.   Allergies No Known Allergies   Home Medications  Prior to Admission medications   Medication Sig Start Date End Date Taking? Authorizing Provider  benzonatate (TESSALON) 200 MG capsule Take 1 capsule (200 mg total) by mouth 3 (three) times daily as needed for  up to 7 days for cough. 05/08/19 05/15/19 Yes Wieters, Hallie C, PA-C  metFORMIN (GLUCOPHAGE) 1000 MG tablet Take 1 tablet (1,000 mg total) by mouth 2 (two) times daily with a meal. 07/19/18  Yes Rutherford Guys, MD  ondansetron (ZOFRAN) 4 MG tablet Take 1 tablet (4 mg total) by mouth every 8 (eight) hours as needed for nausea or vomiting. 05/08/19  Yes Doneta Public, MD  ACCU-CHEK AVIVA PLUS test strip Check sugar once daily; spanish label. 06/19/18   Wardell Honour, MD  ACCU-CHEK [facility] LANCETS lancets Check sugar once daily 06/19/18   Wardell Honour, MD  atorvastatin (LIPITOR) 20 MG tablet Take 1 tablet (20 mg total) by mouth daily. 07/19/18   Rutherford Guys, MD  blood glucose meter kit and supplies Dispense based on patient and insurance preference. Use up to four times daily as directed. (FOR ICD-9 250.00, 250.01). 05/31/16   Pollina, Gwenyth Allegra, MD  Blood Glucose Monitoring Suppl (ACCU-CHEK AVIVA PLUS) w/Device KIT  05/31/16   [provider]  gabapentin (NEURONTIN) 300 MG capsule Take 1 capsule (300 mg total) by mouth 3 (three) times daily. 07/19/18   Rutherford Guys, MD  glipiZIDE (GLUCOTROL XL) 10 MG 24 hr tablet Take 1 tablet (10 mg total) by mouth daily with breakfast. 07/19/18   Rutherford Guys, MD     Critical care time: 35 minutes     Roselie Awkward, MD [facility] Pager: [phone] Cell: [phone] If no response, call [phone]

## 2019-05-16 LAB — CBC WITH DIFFERENTIAL/PLATELET
Abs Immature Granulocytes: 0.05 10*3/uL (ref 0.00–0.07)
Basophils Absolute: 0 10*3/uL (ref 0.0–0.1)
Basophils Relative: 1 %
Eosinophils Absolute: 0.2 10*3/uL (ref 0.0–0.5)
Eosinophils Relative: 2 %
HCT: 41.7 % (ref 39.0–52.0)
Hemoglobin: 14.3 g/dL (ref 13.0–17.0)
Immature Granulocytes: 1 %
Lymphocytes Relative: 15 %
Lymphs Abs: 1.3 10*3/uL (ref 0.7–4.0)
MCH: 31.6 pg (ref 26.0–34.0)
MCHC: 34.3 g/dL (ref 30.0–36.0)
MCV: 92.1 fL (ref 80.0–100.0)
Monocytes Absolute: 0.2 10*3/uL (ref 0.1–1.0)
Monocytes Relative: 2 %
Neutro Abs: 6.9 10*3/uL (ref 1.7–7.7)
Neutrophils Relative %: 79 %
Platelets: 312 10*3/uL (ref 150–400)
RBC: 4.53 MIL/uL (ref 4.22–5.81)
RDW: 13.2 % (ref 11.5–15.5)
WBC: 8.7 10*3/uL (ref 4.0–10.5)
nRBC: 0 % (ref 0.0–0.2)

## 2019-05-16 LAB — COMPREHENSIVE METABOLIC PANEL
ALT: 165 U/L — ABNORMAL HIGH (ref 0–44)
AST: 235 U/L — ABNORMAL HIGH (ref 15–41)
Albumin: 2.4 g/dL — ABNORMAL LOW (ref 3.5–5.0)
Alkaline Phosphatase: 160 U/L — ABNORMAL HIGH (ref 38–126)
Anion gap: 10 (ref 5–15)
BUN: 10 mg/dL (ref 8–23)
CO2: 24 mmol/L (ref 22–32)
Calcium: 7.8 mg/dL — ABNORMAL LOW (ref 8.9–10.3)
Chloride: 105 mmol/L (ref 98–111)
Creatinine, Ser: 0.65 mg/dL (ref 0.61–1.24)
GFR calc Af Amer: >60 mL/min (ref >60)
GFR calc non Af Amer: >60 mL/min (ref >60)
Glucose, Bld: 169 mg/dL — ABNORMAL HIGH (ref 70–99)
Potassium: 3.8 mmol/L (ref 3.5–5.1)
Sodium: 139 mmol/L (ref 135–145)
Total Bilirubin: 0.5 mg/dL (ref 0.3–1.2)
Total Protein: 7 g/dL (ref 6.5–8.1)

## 2019-05-16 LAB — POCT I-STAT 7, (LYTES, BLD GAS, ICA,H+H)
Acid-base deficit: 2 mmol/L (ref 0.0–2.0)
Bicarbonate: 21.3 mmol/L (ref 20.0–28.0)
Calcium, Ion: 1.11 mmol/L — ABNORMAL LOW (ref 1.15–1.40)
HCT: 42 % (ref 39.0–52.0)
Hemoglobin: 14.3 g/dL (ref 13.0–17.0)
O2 Saturation: 90 %
Patient temperature: 98
Potassium: 3.8 mmol/L (ref 3.5–5.1)
Sodium: 139 mmol/L (ref 135–145)
TCO2: 22 mmol/L (ref 22–32)
pCO2 arterial: 31.9 mmHg — ABNORMAL LOW (ref 32.0–48.0)
pH, Arterial: 7.432 (ref 7.350–7.450)
pO2, Arterial: 54 mmHg — ABNORMAL LOW (ref 83.0–108.0)

## 2019-05-16 LAB — GLUCOSE, CAPILLARY
Glucose-Capillary: 178 mg/dL — ABNORMAL HIGH (ref 70–99)
Glucose-Capillary: 312 mg/dL — ABNORMAL HIGH (ref 70–99)
Glucose-Capillary: 306 mg/dL — ABNORMAL HIGH (ref 70–99)

## 2019-05-16 LAB — FERRITIN: Ferritin: 3595 ng/mL — ABNORMAL HIGH (ref 24–336)

## 2019-05-16 LAB — LEGIONELLA PNEUMOPHILA SEROGP 1 UR AG: L. pneumophila Serogp 1 Ur Ag: NEGATIVE

## 2019-05-16 LAB — MAGNESIUM: Magnesium: 2.2 mg/dL (ref 1.7–2.4)

## 2019-05-16 LAB — CK: Total CK: 64 U/L (ref 49–397)

## 2019-05-16 LAB — PHOSPHORUS: Phosphorus: 3.1 mg/dL (ref 2.5–4.6)

## 2019-05-16 LAB — C-REACTIVE PROTEIN: CRP: 29.3 mg/dL — ABNORMAL HIGH (ref <1.0)

## 2019-05-16 MED ORDER — MORPHINE SULFATE (PF) 2 MG/ML IV SOLN
2.0000 mg | INTRAVENOUS | Status: DC | PRN
  Administered 2019-05-16 – 2019-05-18 (×5): 2 mg via INTRAVENOUS
  Filled 2019-05-16 (×4): qty 1

## 2019-05-16 MED ORDER — INSULIN ASPART 100 UNIT/ML ~~LOC~~ SOLN
0.0000 [IU] | SUBCUTANEOUS | Status: DC
  Administered 2019-05-16 (×2): 7 [IU] via SUBCUTANEOUS
  Administered 2019-05-17 (×2): 5 [IU] via SUBCUTANEOUS
  Administered 2019-05-17: 9 [IU] via SUBCUTANEOUS
  Administered 2019-05-17: 5 [IU] via SUBCUTANEOUS

## 2019-05-16 MED ORDER — MORPHINE SULFATE (PF) 2 MG/ML IV SOLN
INTRAVENOUS | Status: AC
  Administered 2019-05-16: 2 mg via INTRAVENOUS
  Filled 2019-05-16: qty 1

## 2019-05-16 MED ORDER — FAMOTIDINE 20 MG PO TABS
20.0000 mg | ORAL_TABLET | Freq: Two times a day (BID) | ORAL | Status: DC
  Administered 2019-05-16: 20 mg via ORAL
  Filled 2019-05-16 (×2): qty 1

## 2019-05-16 MED ORDER — FUROSEMIDE 10 MG/ML IJ SOLN
40.0000 mg | Freq: Once | INTRAMUSCULAR | Status: AC
  Administered 2019-05-16: 40 mg via INTRAVENOUS
  Filled 2019-05-16: qty 4

## 2019-05-16 MED ORDER — METHYLPREDNISOLONE SODIUM SUCC 40 MG IJ SOLR
40.0000 mg | Freq: Four times a day (QID) | INTRAMUSCULAR | Status: AC
  Administered 2019-05-16 – 2019-05-17 (×3): 40 mg via INTRAVENOUS
  Filled 2019-05-16 (×3): qty 1

## 2019-05-16 MED ORDER — METHYLPREDNISOLONE SODIUM SUCC 125 MG IJ SOLR
50.0000 mg | Freq: Two times a day (BID) | INTRAMUSCULAR | Status: DC
  Administered 2019-05-16: 50 mg via INTRAVENOUS
  Filled 2019-05-16: qty 2

## 2019-05-16 MED ORDER — ONDANSETRON HCL 4 MG/2ML IJ SOLN
4.0000 mg | Freq: Four times a day (QID) | INTRAMUSCULAR | Status: DC | PRN
  Administered 2019-05-16: 4 mg via INTRAVENOUS
  Filled 2019-05-16: qty 2

## 2019-05-16 MED ORDER — ENOXAPARIN SODIUM 40 MG/0.4ML ~~LOC~~ SOLN
40.0000 mg | Freq: Two times a day (BID) | SUBCUTANEOUS | Status: DC
  Administered 2019-05-16: 40 mg via SUBCUTANEOUS
  Filled 2019-05-16: qty 0.4

## 2019-05-16 NOTE — Progress Notes (Signed)
PROGRESS NOTE  Lance Mason [facility] WUJ:[id number] DOB: [birth year] DOA: 04/22/2019 PCP: Patient, No Pcp Per   LOS: 2 days   Brief Narrative / Interim history: 63 year old male with history of diabetes, hyperlipidemia, was admitted to the hospital 04/21/2019 with shortness of breath, found to have hypoxic respiratory failure due to COVID-19 pneumonia.  He was admitted to the ICU requiring nonrebreather mask.  He received a dose of Actemra on 5/27 and was started on a course of Remdesivir.  He under went a CT angiogram on 5/27 with diffuse bilateral patchy airspace disease with groundglass appearance, no PE  Subjective: -Continues to complain of shortness of breath along with poor appetite.   Assessment & Plan: Principal Problem:   Acute respiratory failure with hypoxia (HCC) Active Problems:   Type 2 diabetes mellitus without complication, without long-term current use of insulin (HCC)   Pure hypercholesterolemia   COVID-19 virus infection   CAP (community acquired pneumonia)   Principal Problem Acute Hypoxic Respiratory Failure due to Covid-19 Viral Illness -Continue supportive treatment, patient's respiratory status deteriorated over the last 24 hours and he was moved to the ICU.  Continue the severe, add steroids today given worsening respiratory status -Discussed with Dr. Molli Knock with critical care, make patient n.p.o. in case intubation necessary later on -Initially started on vancomycin, cefepime and azithromycin, cultures have remained negative, MRSA PCR was negative, discontinue vancomycin today  The treatment plan and use of medications and known side effects were discussed with patient/family, they were clearly explained that there is no proven definitive treatment for COVID-19 infection, any medications used here are based on published clinical articles/anecdotal data which are not peer-reviewed or randomized control trials.  Complete risks and long-term side effects are unknown,  however in the best clinical judgment they seem to be of some clinical benefit rather than medical risks.  Patient agree with the treatment plan and want to receive the given medications.  COVID-19 Labs  Recent Labs    05/16/2019 1815 05/13/2019 2018 05/15/19 0455 05/16/19 0455  DDIMER 3.48*  --   --   --   FERRITIN  --  1,538* 1,910* 3,595*  LDH 425*  --   --   --   CRP  --  36.7* 33.3* 29.3*    Lab Results  Component Value Date   SARSCOV2NAA DETECTED (A) 05/08/2019    Type 2 diabetes mellitus -Hold oral agents, continue sliding scale, blood sugars are stable  Elevated LFTs -Possibly related to Covid versus Actemra, slightly increased today we will continue to monitor  Scheduled Meds:  dextromethorphan-guaiFENesin  1 tablet Oral BID   enoxaparin (LOVENOX) injection  40 mg Subcutaneous Q12H   famotidine  20 mg Oral BID   gabapentin  300 mg Oral TID   insulin aspart  0-5 Units Subcutaneous QHS   insulin aspart  0-9 Units Subcutaneous TID WC   methylPREDNISolone (SOLU-MEDROL) injection  40 mg Intravenous Q6H   multivitamin with minerals  1 tablet Oral Daily   Ensure Max Protein  11 oz Oral BID   Continuous Infusions:  azithromycin Stopped (05/15/19 2205)   ceFEPime (MAXIPIME) IV Stopped (05/16/19 0551)   remdesivir 100 mg in NS 250 mL 100 mg (05/16/19 1215)   vancomycin 1,000 mg (05/16/19 1213)   PRN Meds:.acetaminophen, morphine injection, ondansetron (ZOFRAN) IV  DVT prophylaxis: Lovenox Code Status: Full code Family Communication: Discussed with patient Disposition Plan: Remain in ICU  Consultants:   Critical care  Procedures:   None   Antimicrobials:  Vancomycin 5/26 >> 5/28  Cefepime 5/26 >>  Azithromycin 5/26 >>   Objective: Vitals:   05/16/19 1000 05/16/19 1100 05/16/19 1200 05/16/19 1237  BP: 129/81 135/88 137/60   Pulse: (!) 123 (!) 114 (!) 117 (!) 107  Resp: (!) 32 (!) 36 (!) 41 (!) 29  Temp:   97.8 F (36.6 C)   TempSrc:    Oral   SpO2: (!) 89% 90% (!) 86% (!) 84%  Weight:      Height:        Intake/Output Summary (Last 24 hours) at 05/16/2019 1345 Last data filed at 05/16/2019 1215 Gross per 24 hour  Intake 1453.02 ml  Output 1025 ml  Net 428.02 ml   Filed Weights   06-13-19 2354  Weight: 105.5 kg    Examination:  Constitutional: Appears dyspneic, tachypneic Eyes: PERRL, lids and conjunctivae normal ENMT: Mucous membranes are moist.  Respiratory: Diminished sounds at the bases, no wheezing heard.  Increased respiratory effort, tachypneic. Faint crackles Cardiovascular: Regular rate and rhythm, no murmurs / rubs / gallops.  Abdomen: no tenderness. Bowel sounds positive.  Musculoskeletal: no clubbing / cyanosis.  Skin: no rashes, lesions Neurologic: CN 2-12 grossly intact. Strength 5/5 in all 4.  Psychiatric: Normal judgment and insight. Alert and oriented x 3. Normal mood.    Data Reviewed: I have independently reviewed following labs and imaging studies   CBC: Recent Labs  Lab 13-Jun-2019 1815 05/15/19 0455 05/15/19 0911 05/16/19 0455 05/16/19 0620  WBC 7.5 7.3  --  8.7  --   NEUTROABS 6.0 5.4  --  6.9  --   HGB 13.2 13.2 12.2* 14.3 14.3  HCT 37.9* 39.6 36.0* 41.7 42.0  MCV 91.1 91.7  --  92.1  --   PLT 210 245  --  312  --    Basic Metabolic Panel: Recent Labs  Lab 2019-06-13 1815 05/15/19 0455 05/15/19 0911 05/16/19 0455 05/16/19 0620  NA 133* 136 138 139 139  K 3.8 4.0 3.6 3.8 3.8  CL 102 102  --  105  --   CO2 23 25  --  24  --   GLUCOSE 179* 146*  --  169*  --   BUN 12 10  --  10  --   CREATININE 0.75 0.68  --  0.65  --   CALCIUM 7.8* 7.8*  --  7.8*  --   MG  --  2.2  --  2.2  --   PHOS  --  2.4*  --  3.1  --    GFR: Estimated Creatinine Clearance: 105.7 mL/min (by C-G formula based on SCr of 0.65 mg/dL). Liver Function Tests: Recent Labs  Lab June 13, 2019 1815 05/15/19 0455 05/16/19 0455  AST 114* 106* 235*  ALT 88* 79* 165*  ALKPHOS 96 100 160*  BILITOT 0.5 0.5  0.5  PROT 7.4 7.3 7.0  ALBUMIN 2.9* 2.7* 2.4*   No results for input(s): LIPASE, AMYLASE in the last 168 hours. No results for input(s): AMMONIA in the last 168 hours. Coagulation Profile: No results for input(s): INR, PROTIME in the last 168 hours. Cardiac Enzymes: Recent Labs  Lab 2019-06-13 1815 06-13-2019 2018 05/15/19 0450 05/15/19 0455 05/16/19 0455  CKTOTAL  --  62  --  74 64  CKMB  --  1.7  --   --   --   TROPONINI <0.03  --  <0.03  --   --    BNP (last 3 results) No results for input(s): PROBNP  in the last 8760 hours. HbA1C: No results for input(s): HGBA1C in the last 72 hours. CBG: Recent Labs  Lab 05/15/19 0737 05/15/19 1142 05/15/19 1609 05/15/19 2113 05/16/19 0756  GLUCAP 132* 158* 212* 164* 178*   Lipid Profile: Recent Labs    05/21/19 2018  TRIG 267*   Thyroid Function Tests: No results for input(s): TSH, T4TOTAL, FREET4, T3FREE, THYROIDAB in the last 72 hours. Anemia Panel: Recent Labs    05/15/19 0455 05/16/19 0455  FERRITIN 1,910* 3,595*   Urine analysis:    Component Value Date/Time   COLORURINE YELLOW 05/28/2016 2056   APPEARANCEUR CLEAR 05/28/2016 2056   LABSPEC 1.041 (H) 05/28/2016 2056   PHURINE 5.0 05/28/2016 2056   GLUCOSEU >1000 (A) 05/28/2016 2056   HGBUR NEGATIVE 05/28/2016 2056   BILIRUBINUR negative 06/19/2018 1601   KETONESUR negative 06/19/2018 1601   KETONESUR 15 (A) 05/28/2016 2056   PROTEINUR negative 06/19/2018 1601   PROTEINUR NEGATIVE 05/28/2016 2056   UROBILINOGEN 0.2 06/19/2018 1601   NITRITE Negative 06/19/2018 1601   NITRITE NEGATIVE 05/28/2016 2056   LEUKOCYTESUR Negative 06/19/2018 1601   Sepsis Labs: Invalid input(s): PROCALCITONIN, LACTICIDVEN  Recent Results (from the past 240 hour(s))  Novel Coronavirus,NAA,(SEND-OUT TO REF LAB - TAT 24-48 hrs); Hosp Order     Status: Abnormal   Collection Time: 05/08/19  4:43 PM  Result Value Ref Range Status   SARS-CoV-2, NAA DETECTED (A) NOT DETECTED Final     Comment: CRITICAL RESULT CALLED TO, READ BACK BY AND VERIFIED WITH: Vernard GamblesK. STRAUGHAN, NCED RN, AT 1625 ON 05/09/19 BY C. JESSUP, MLT. (NOTE) This test was developed and its performance characteristics determined by [facility]. This test has not been FDA cleared or approved. This test has been authorized by FDA under an Emergency Use Authorization (EUA). This test is only authorized for the duration of time the declaration that circumstances exist justifying the authorization of the emergency use of in vitro diagnostic tests for detection of SARS-CoV-2 virus and/or diagnosis of COVID-19 infection under section 564(b)(1) of the Act, 21 U.S.C. 161WRU-0(A)(5360bbb-3(b)(1), unless the authorization is terminated or revoked sooner. When diagnostic testing is negative, the possibility of a false negative result should be considered in the context of a patient's recent exposures and the presence of clinical signs and symptoms consistent with COVID-19. An individual without symptoms of COVID-19  and who is not shedding SARS-CoV-2 virus would expect to have a negative (not detected) result in this assay. Performed At: [facility] [address] [place], [place] [id number] Jolene SchimkeNagendra Sanjai MD NW:[id number]:[phone]    Coronavirus Source NASOPHARYNGEAL  Final    Comment: Performed at [facility], [address] [place], [place] [address]  Culture, blood (Routine X 2) w Reflex to ID Panel     Status: None (Preliminary result)   Collection Time: 05/21/19  8:18 PM  Result Value Ref Range Status   Specimen Description   Final    BLOOD BLOOD LEFT FOREARM Performed at [facility], [address] [place], [place] [address]    Special Requests   Final    BOTTLES DRAWN AEROBIC AND ANAEROBIC Blood Culture adequate volume Performed at [facility], [address] [place], [place] [address]    Culture   Final    NO GROWTH < 12 HOURS Performed at [facility], [address] [place], [place] [address]    Report Status PENDING  Incomplete  MRSA PCR Screening  Status: None   Collection Time: 05/15/19  1:55 PM  Result Value Ref Range Status   MRSA by PCR NEGATIVE NEGATIVE Final    Comment:        The GeneXpert MRSA Assay (FDA approved for NASAL specimens only), is one component of a comprehensive MRSA colonization surveillance program. It is not intended to diagnose MRSA infection nor to guide or monitor treatment for MRSA infections. Performed at [facility], [address] [place], [place] [address]       Radiology Studies: Ct Angio Chest Pe W Or Wo Contrast  Result Date: 05/15/2019 CLINICAL DATA:  Shortness of breath and positive COVID-19 EXAM: CT ANGIOGRAPHY CHEST WITH CONTRAST TECHNIQUE: Multidetector CT imaging of the chest was performed using the standard protocol during bolus administration of intravenous contrast. Multiplanar CT image reconstructions and MIPs were obtained to evaluate the vascular anatomy. CONTRAST:  OMNIPAQUE IOHEXOL 350 MG/ML SOLN COMPARISON:  Chest x-ray from previous day. FINDINGS: Cardiovascular: Thoracic aorta is within normal limits. No dissection or aneurysmal dilatation is seen. No cardiac enlargement is noted. No coronary calcifications are seen. Pulmonary artery is well visualized without evidence of pulmonary embolism. Mediastinum/Nodes: Esophagus is within normal limits. Thoracic inlet is unremarkable. Scattered lymph nodes are noted throughout the mediastinum. Largest of these lies in the subcarinal region measuring just under 16 mm. No definitive hilar adenopathy is noted. Lungs/Pleura: Lungs demonstrate bilateral ground-glass infiltrative changes throughout both lungs consistent with the given clinical history of COVID-19 positivity. Bilateral pleural calcifications are noted. No sizable effusion is seen. No parenchymal nodules are noted. Upper Abdomen: Fatty liver is  noted. Musculoskeletal: Degenerative changes of the thoracic spine are noted. No acute bony abnormality is seen. Review of the MIP images confirms the above findings. IMPRESSION: Diffuse bilateral ground-glass infiltrates consistent with the known history of COVID-19 positivity. No evidence of pulmonary emboli. Scattered mediastinal lymphadenopathy as described. Fatty liver. Electronically Signed   By: Alcide Clever M.D.   On: 05/15/2019 01:41   Dg Chest Port 1 View  Result Date: 05/13/2019 CLINICAL DATA:  63 year old male with history of worsening shortness of breath. Diagnosed with COVID-19 10 days ago. EXAM: PORTABLE CHEST 1 VIEW COMPARISON:  Chest x-ray 05/08/2019. FINDINGS: Low lung volumes. Calcified pleural plaques throughout the thorax bilaterally again noted. Mild diffuse interstitial prominence and patchy ill-defined airspace disease throughout the mid to lower lungs bilaterally (left greater than right). No pleural effusions. No evidence of pulmonary edema. Heart size is borderline enlarged, accentuated by low lung volumes and portable AP technique. Upper mediastinal contours are within normal limits. IMPRESSION: 1. Findings are compatible with multilobar pneumonia, as above. 2. Asbestos related pleural disease redemonstrated. Electronically Signed   By: Trudie Reed M.D.   On: 05/06/2019 18:49    Pamella Pert, MD, PhD [facility]  Contact via  [email or web address]  [facility] Office Info P: [phone]  F: [phone]

## 2019-05-16 NOTE — Progress Notes (Signed)
Pts daughter spoke with Gerilyn Nestle while I was on break, updated on pt status.

## 2019-05-16 NOTE — Progress Notes (Signed)
Spoke with patients Granddaughter Steward Drone, updated on pt status.

## 2019-05-16 NOTE — Progress Notes (Signed)
During shift, pt with Sp02 between 88-93% on 15L HFNC, tachypneic to the 30,s but in no apparent respiratory distress.  0145, pt had episode of severe coughing that led to respiratory distress and desaturations to the 70s, slow to improve. Placed on NRB as well as HFNC.  Endorsed chest pain during episode. EKG obtained. Pt verbalized improvement in pain with improvement in oxygenation. Rito Ehrlich, MD, notified. Orders received for ABG at 0600.

## 2019-05-16 NOTE — Progress Notes (Signed)
NAME:  Jonavin Skura, MRN:  [id number], DOB:  November 18, 1956, LOS: 2 ADMISSION DATE:  05/04/2019, CONSULTATION DATE:  05/15/2019 REFERRING MD:  Dr. Kerry Hough, CHIEF COMPLAINT:  Dyspnea   Brief History   63 y/o male with DM2, HTN admitted on 5/26 with acute on chronic respiratory failure with hypoxemia due to COVID 19 pneumonia.  History of present illness   63 year old male with a past medical history significant for hypertension and hyperlipidemia was admitted to our facility on May 14, 2019 in the setting of worsening shortness of breath.  He said that he has developed increasing shortness of breath and cough with some mucus production for about a week.  He has smoked intermittently over the years but he says he does not smoke every day.  He says that he was diagnosed with coronavirus approximately 5 days prior to admission.  After admission he was noted to be hypoxemic.  He was treated with a course of Actemra and started on Remdesivir.  Because of increasing oxygen requirements pulmonary and critical care medicine was consulted for close monitoring.  He has been moved to the intensive care unit.  Currently he is breathing with a 100% nonrebreather mask.  He says he feels like his breathing may be getting better.  Past Medical History  Hypertension Hyperlipidemia DM2  Significant Hospital Events   5/26 admission  Consults:  Pulmonary   Procedures:    Significant Diagnostic Tests:  CT angiogram chest 5/27 > diffuse bilateral patchy airspace disease with groundglass upper lobes predominant, no pulmonary embolism  Micro Data:  5/21 SARS-COV-2 > positive  Antimicrobials:  5/26 blood culture>   Interim history/subjective:  Severe hypoxemia overnight Desaturating on 100% mask  Objective   Blood pressure 137/60, pulse (!) 107, temperature 97.8 F (36.6 C), temperature source Oral, resp. rate (!) 29, height 5\' 5"  (1.651 m), weight 105.5 kg, SpO2 (!) 84 %.    FiO2 (%):  [100 %] 100  %   Intake/Output Summary (Last 24 hours) at 05/16/2019 1310 Last data filed at 05/16/2019 1215 Gross per 24 hour  Intake 1453.02 ml  Output 1025 ml  Net 428.02 ml   Filed Weights   04/24/2019 2354  Weight: 105.5 kg   Examination:  General:  Respiratory distress in bed HENT: [place]/AT, PERRL, EOM-I and MMM PULM: Bibasilar crackles CV: RRR, Nl S1/S2 and -M/R/G GI: Soft, NT, ND and +BS MSK: WNL Neuro: Alert and interactive, moving all ext to command  I reviewed CXR myself, infiltrate noted  Resolved Hospital Problem list     Assessment & Plan:  Acute coronavirus pneumonia with acute respiratory failure with hypoxemia Prone patient Maintain in the ICU Make NPO incase intubation is necessary (hold of for now) No NIV for now When considering whether or not to intubate, focus more on ventilatory failure i.e. nasal flaring, accessory muscle use, paradoxical breathing.  If mental status change then would likely need intubation Continue using Remdesivir per protocol Continue Actemra Add steroids given clinical deterioration of O2 demand  Mediastinal lymphadenopathy: likely reactive > will need f/u CT in 3 months  Best practice:  Per [facility]  Labs   CBC: Recent Labs  Lab 04/24/2019 1815 05/15/19 0455 05/15/19 0911 05/16/19 0455 05/16/19 0620  WBC 7.5 7.3  --  8.7  --   NEUTROABS 6.0 5.4  --  6.9  --   HGB 13.2 13.2 12.2* 14.3 14.3  HCT 37.9* 39.6 36.0* 41.7 42.0  MCV 91.1 91.7  --  92.1  --  PLT 210 245  --  312  --     Basic Metabolic Panel: Recent Labs  Lab September 19, 2019 1815 05/15/19 0455 05/15/19 0911 05/16/19 0455 05/16/19 0620  NA 133* 136 138 139 139  K 3.8 4.0 3.6 3.8 3.8  CL 102 102  --  105  --   CO2 23 25  --  24  --   GLUCOSE 179* 146*  --  169*  --   BUN 12 10  --  10  --   CREATININE 0.75 0.68  --  0.65  --   CALCIUM 7.8* 7.8*  --  7.8*  --   MG  --  2.2  --  2.2  --   PHOS  --  2.4*  --  3.1  --    GFR: Estimated Creatinine Clearance: 105.7 mL/min  (by C-G formula based on SCr of 0.65 mg/dL). Recent Labs  Lab September 19, 2019 1815 September 19, 2019 2015 05/15/19 0455 05/16/19 0455  PROCALCITON 0.92  --   --   --   WBC 7.5  --  7.3 8.7  LATICACIDVEN  --  1.5  --   --     Liver Function Tests: Recent Labs  Lab September 19, 2019 1815 05/15/19 0455 05/16/19 0455  AST 114* 106* 235*  ALT 88* 79* 165*  ALKPHOS 96 100 160*  BILITOT 0.5 0.5 0.5  PROT 7.4 7.3 7.0  ALBUMIN 2.9* 2.7* 2.4*   No results for input(s): LIPASE, AMYLASE in the last 168 hours. No results for input(s): AMMONIA in the last 168 hours.  ABG    Component Value Date/Time   PHART 7.432 05/16/2019 0620   PCO2ART 31.9 (L) 05/16/2019 0620   PO2ART 54.0 (L) 05/16/2019 0620   HCO3 21.3 05/16/2019 0620   TCO2 22 05/16/2019 0620   ACIDBASEDEF 2.0 05/16/2019 0620   O2SAT 90.0 05/16/2019 0620     Coagulation Profile: No results for input(s): INR, PROTIME in the last 168 hours.  Cardiac Enzymes: Recent Labs  Lab September 19, 2019 1815 September 19, 2019 2018 05/15/19 0450 05/15/19 0455 05/16/19 0455  CKTOTAL  --  62  --  74 64  CKMB  --  1.7  --   --   --   TROPONINI <0.03  --  <0.03  --   --     HbA1C: Hemoglobin A1C  Date/Time Value Ref Range Status  06/19/2018 04:01 PM 9.1 (A) 4.0 - 5.6 % Final  01/02/2017 06:51 PM 12.8  Final    CBG: Recent Labs  Lab 05/15/19 0737 05/15/19 1142 05/15/19 1609 05/15/19 2113 05/16/19 0756  GLUCAP 132* 158* 212* 164* 178*   The patient is critically ill with multiple organ systems failure and requires high complexity decision making for assessment and support, frequent evaluation and titration of therapies, application of advanced monitoring technologies and extensive interpretation of multiple databases.   Critical Care Time devoted to patient care services described in this note is  33  Minutes. This time reflects time of care of this signee Dr Koren BoundWesam . This critical care time does not reflect procedure time, or teaching time or supervisory  time of PA/NP/Med student/Med Resident etc but could involve care discussion time.  Alyson ReedyWesam G. , M.D. [facility] Pulmonary/Critical Care Medicine. Pager: [phone]. After hours pager: [phone].

## 2019-05-17 ENCOUNTER — Inpatient Hospital Stay (HOSPITAL_COMMUNITY): Payer: BLUE CROSS/BLUE SHIELD

## 2019-05-17 LAB — POCT I-STAT 7, (LYTES, BLD GAS, ICA,H+H)
Acid-base deficit: 3 mmol/L — ABNORMAL HIGH (ref 0.0–2.0)
Acid-base deficit: 3 mmol/L — ABNORMAL HIGH (ref 0.0–2.0)
Bicarbonate: 20 mmol/L (ref 20.0–28.0)
Bicarbonate: 22.1 mmol/L (ref 20.0–28.0)
Calcium, Ion: 1.14 mmol/L — ABNORMAL LOW (ref 1.15–1.40)
Calcium, Ion: 1.11 mmol/L — ABNORMAL LOW (ref 1.15–1.40)
HCT: 42 % (ref 39.0–52.0)
HCT: 36 % — ABNORMAL LOW (ref 39.0–52.0)
Hemoglobin: 14.3 g/dL (ref 13.0–17.0)
Hemoglobin: 12.2 g/dL — ABNORMAL LOW (ref 13.0–17.0)
O2 Saturation: 98 %
O2 Saturation: 90 %
Patient temperature: 97.9
Patient temperature: 98.3
Potassium: 3.4 mmol/L — ABNORMAL LOW (ref 3.5–5.1)
Potassium: 4 mmol/L (ref 3.5–5.1)
Sodium: 144 mmol/L (ref 135–145)
Sodium: 145 mmol/L (ref 135–145)
TCO2: 21 mmol/L — ABNORMAL LOW (ref 22–32)
TCO2: 23 mmol/L (ref 22–32)
pCO2 arterial: 28.6 mmHg — ABNORMAL LOW (ref 32.0–48.0)
pCO2 arterial: 39.9 mmHg (ref 32.0–48.0)
pH, Arterial: 7.452 — ABNORMAL HIGH (ref 7.350–7.450)
pH, Arterial: 7.351 (ref 7.350–7.450)
pO2, Arterial: 92 mmHg (ref 83.0–108.0)
pO2, Arterial: 61 mmHg — ABNORMAL LOW (ref 83.0–108.0)

## 2019-05-17 LAB — GLUCOSE, CAPILLARY
Glucose-Capillary: 180 mg/dL — ABNORMAL HIGH (ref 70–99)
Glucose-Capillary: 181 mg/dL — ABNORMAL HIGH (ref 70–99)
Glucose-Capillary: 205 mg/dL — ABNORMAL HIGH (ref 70–99)
Glucose-Capillary: 223 mg/dL — ABNORMAL HIGH (ref 70–99)
Glucose-Capillary: 225 mg/dL — ABNORMAL HIGH (ref 70–99)
Glucose-Capillary: 257 mg/dL — ABNORMAL HIGH (ref 70–99)
Glucose-Capillary: 260 mg/dL — ABNORMAL HIGH (ref 70–99)
Glucose-Capillary: 270 mg/dL — ABNORMAL HIGH (ref 70–99)
Glucose-Capillary: 278 mg/dL — ABNORMAL HIGH (ref 70–99)
Glucose-Capillary: 296 mg/dL — ABNORMAL HIGH (ref 70–99)
Glucose-Capillary: 355 mg/dL — ABNORMAL HIGH (ref 70–99)

## 2019-05-17 LAB — LACTATE DEHYDROGENASE: LDH: 762 U/L — ABNORMAL HIGH (ref 98–192)

## 2019-05-17 LAB — COMPREHENSIVE METABOLIC PANEL
ALT: 136 U/L — ABNORMAL HIGH (ref 0–44)
AST: 118 U/L — ABNORMAL HIGH (ref 15–41)
Albumin: 2.6 g/dL — ABNORMAL LOW (ref 3.5–5.0)
Alkaline Phosphatase: 183 U/L — ABNORMAL HIGH (ref 38–126)
Anion gap: 14 (ref 5–15)
BUN: 19 mg/dL (ref 8–23)
CO2: 24 mmol/L (ref 22–32)
Calcium: 8.1 mg/dL — ABNORMAL LOW (ref 8.9–10.3)
Chloride: 103 mmol/L (ref 98–111)
Creatinine, Ser: 0.79 mg/dL (ref 0.61–1.24)
GFR calc Af Amer: >60 mL/min (ref >60)
GFR calc non Af Amer: >60 mL/min (ref >60)
Glucose, Bld: 252 mg/dL — ABNORMAL HIGH (ref 70–99)
Potassium: 3.8 mmol/L (ref 3.5–5.1)
Sodium: 141 mmol/L (ref 135–145)
Total Bilirubin: 0.4 mg/dL (ref 0.3–1.2)
Total Protein: 7.1 g/dL (ref 6.5–8.1)

## 2019-05-17 LAB — CBC WITH DIFFERENTIAL/PLATELET
Abs Immature Granulocytes: 0.07 10*3/uL (ref 0.00–0.07)
Basophils Absolute: 0 10*3/uL (ref 0.0–0.1)
Basophils Relative: 0 %
Eosinophils Absolute: 0 10*3/uL (ref 0.0–0.5)
Eosinophils Relative: 0 %
HCT: 44.8 % (ref 39.0–52.0)
Hemoglobin: 14.8 g/dL (ref 13.0–17.0)
Immature Granulocytes: 1 %
Lymphocytes Relative: 8 %
Lymphs Abs: 0.8 10*3/uL (ref 0.7–4.0)
MCH: 30.3 pg (ref 26.0–34.0)
MCHC: 33 g/dL (ref 30.0–36.0)
MCV: 91.8 fL (ref 80.0–100.0)
Monocytes Absolute: 0.2 10*3/uL (ref 0.1–1.0)
Monocytes Relative: 2 %
Neutro Abs: 8.3 10*3/uL — ABNORMAL HIGH (ref 1.7–7.7)
Neutrophils Relative %: 89 %
Platelets: 316 10*3/uL (ref 150–400)
RBC: 4.88 MIL/uL (ref 4.22–5.81)
RDW: 13.2 % (ref 11.5–15.5)
WBC: 9.4 10*3/uL (ref 4.0–10.5)
nRBC: 0 % (ref 0.0–0.2)

## 2019-05-17 LAB — TYPE AND SCREEN
ABO/RH(D): O POS
Antibody Screen: NEGATIVE

## 2019-05-17 LAB — MAGNESIUM: Magnesium: 2.5 mg/dL — ABNORMAL HIGH (ref 1.7–2.4)

## 2019-05-17 LAB — C-REACTIVE PROTEIN: CRP: 15 mg/dL — ABNORMAL HIGH (ref <1.0)

## 2019-05-17 LAB — FERRITIN: Ferritin: 2413 ng/mL — ABNORMAL HIGH (ref 24–336)

## 2019-05-17 LAB — CK: Total CK: 115 U/L (ref 49–397)

## 2019-05-17 LAB — D-DIMER, QUANTITATIVE: D-Dimer, Quant: 12.99 ug/mL-FEU — ABNORMAL HIGH (ref 0.00–0.50)

## 2019-05-17 LAB — PHOSPHORUS: Phosphorus: 2.3 mg/dL — ABNORMAL LOW (ref 2.5–4.6)

## 2019-05-17 MED ORDER — METOPROLOL TARTRATE 5 MG/5ML IV SOLN
5.0000 mg | Freq: Four times a day (QID) | INTRAVENOUS | Status: DC | PRN
  Administered 2019-05-17 – 2019-06-09 (×19): 5 mg via INTRAVENOUS
  Filled 2019-05-17 (×21): qty 5

## 2019-05-17 MED ORDER — SODIUM CHLORIDE 0.9 % IV SOLN
INTRAVENOUS | Status: DC | PRN
  Administered 2019-05-17 – 2019-06-07 (×5): 250 mL via INTRAVENOUS

## 2019-05-17 MED ORDER — LORAZEPAM 2 MG/ML IJ SOLN
INTRAMUSCULAR | Status: AC
  Filled 2019-05-17: qty 1

## 2019-05-17 MED ORDER — FENTANYL CITRATE (PF) 100 MCG/2ML IJ SOLN: 50.0000 ug | Freq: Once | INTRAMUSCULAR | Status: AC

## 2019-05-17 MED ORDER — FAMOTIDINE IN NACL 20-0.9 MG/50ML-% IV SOLN
20.0000 mg | Freq: Two times a day (BID) | INTRAVENOUS | Status: DC
  Administered 2019-05-17 – 2019-05-18 (×4): 20 mg via INTRAVENOUS
  Filled 2019-05-17 (×4): qty 50

## 2019-05-17 MED ORDER — ROCURONIUM BROMIDE 10 MG/ML (PF) SYRINGE
50.0000 mg | PREFILLED_SYRINGE | Freq: Once | INTRAVENOUS | Status: AC
  Administered 2019-05-17: 50 mg via INTRAVENOUS

## 2019-05-17 MED ORDER — SODIUM CHLORIDE 0.9 % IV SOLN
1.0000 mg/h | INTRAVENOUS | Status: DC
  Filled 2019-05-17: qty 25

## 2019-05-17 MED ORDER — PROPOFOL 1000 MG/100ML IV EMUL
0.0000 ug/kg/min | INTRAVENOUS | Status: DC
  Administered 2019-05-17: 5 ug/kg/min via INTRAVENOUS
  Filled 2019-05-17: qty 100

## 2019-05-17 MED ORDER — CHLORHEXIDINE GLUCONATE 0.12% ORAL RINSE (MEDLINE KIT)
15.0000 mL | Freq: Two times a day (BID) | OROMUCOSAL | Status: DC
  Administered 2019-05-17 – 2019-05-19 (×4): 15 mL via OROMUCOSAL

## 2019-05-17 MED ORDER — POTASSIUM CHLORIDE CRYS ER 20 MEQ PO TBCR
40.0000 meq | EXTENDED_RELEASE_TABLET | Freq: Three times a day (TID) | ORAL | Status: DC
  Filled 2019-05-17: qty 2

## 2019-05-17 MED ORDER — ETOMIDATE 2 MG/ML IV SOLN: 20.0000 mg | Freq: Once | INTRAVENOUS | Status: AC

## 2019-05-17 MED ORDER — HEPARIN BOLUS VIA INFUSION
4000.0000 [IU] | Freq: Once | INTRAVENOUS | Status: AC
  Administered 2019-05-17: 4000 [IU] via INTRAVENOUS
  Filled 2019-05-17: qty 4000

## 2019-05-17 MED ORDER — FENTANYL CITRATE (PF) 100 MCG/2ML IJ SOLN
100.0000 ug | Freq: Once | INTRAMUSCULAR | Status: AC
  Administered 2019-05-17: 100 ug via INTRAVENOUS

## 2019-05-17 MED ORDER — NOREPINEPHRINE 4 MG/250ML-% IV SOLN
INTRAVENOUS | Status: AC
  Filled 2019-05-17: qty 250

## 2019-05-17 MED ORDER — FENTANYL CITRATE (PF) 100 MCG/2ML IJ SOLN
INTRAMUSCULAR | Status: AC
  Filled 2019-05-17: qty 2

## 2019-05-17 MED ORDER — HEPARIN (PORCINE) 25000 UT/250ML-% IV SOLN
1300.0000 [IU]/h | INTRAVENOUS | Status: DC
  Administered 2019-05-17: 1300 [IU]/h via INTRAVENOUS
  Filled 2019-05-17: qty 250

## 2019-05-17 MED ORDER — ORAL CARE MOUTH RINSE
15.0000 mL | OROMUCOSAL | Status: DC
  Administered 2019-05-17 – 2019-05-28 (×107): 15 mL via OROMUCOSAL

## 2019-05-17 MED ORDER — SODIUM CHLORIDE 0.9 % IV SOLN
1.0000 mg/kg/h | INTRAVENOUS | Status: DC
  Administered 2019-05-17: 18:00:00 1 mg/kg/h via INTRAVENOUS
  Filled 2019-05-17 (×2): qty 25

## 2019-05-17 MED ORDER — NOREPINEPHRINE 4 MG/250ML-% IV SOLN
0.0000 ug/min | INTRAVENOUS | Status: DC
  Administered 2019-05-18: 40 ug/min via INTRAVENOUS
  Administered 2019-05-18: 35 ug/min via INTRAVENOUS
  Administered 2019-05-18: 2 ug/min via INTRAVENOUS
  Administered 2019-05-18: 30 ug/min via INTRAVENOUS
  Filled 2019-05-17: qty 500
  Filled 2019-05-17: qty 250

## 2019-05-17 MED ORDER — LORAZEPAM 2 MG/ML IJ SOLN
0.5000 mg | Freq: Once | INTRAMUSCULAR | Status: AC
  Administered 2019-05-17: 0.5 mg via INTRAVENOUS

## 2019-05-17 MED ORDER — METHYLPREDNISOLONE SODIUM SUCC 40 MG IJ SOLR
40.0000 mg | Freq: Four times a day (QID) | INTRAMUSCULAR | Status: AC
  Administered 2019-05-17 (×3): 40 mg via INTRAVENOUS
  Filled 2019-05-17 (×3): qty 1

## 2019-05-17 MED ORDER — POLYVINYL ALCOHOL 1.4 % OP SOLN
1.0000 [drp] | OPHTHALMIC | Status: DC | PRN
  Administered 2019-05-17 – 2019-06-04 (×5): 1 [drp] via OPHTHALMIC
  Filled 2019-05-17: qty 15

## 2019-05-17 MED ORDER — INSULIN GLARGINE 100 UNIT/ML ~~LOC~~ SOLN
10.0000 [IU] | Freq: Every day | SUBCUTANEOUS | Status: DC
  Filled 2019-05-17: qty 0.1

## 2019-05-17 MED ORDER — DEXMEDETOMIDINE HCL IN NACL 400 MCG/100ML IV SOLN
0.4000 ug/kg/h | INTRAVENOUS | Status: DC
  Administered 2019-05-17: 0.4 ug/kg/h via INTRAVENOUS
  Filled 2019-05-17: qty 100

## 2019-05-17 MED ORDER — ETOMIDATE 2 MG/ML IV SOLN
INTRAVENOUS | Status: AC
  Filled 2019-05-17: qty 10

## 2019-05-17 MED ORDER — SODIUM CHLORIDE 0.9% IV SOLUTION: Freq: Once | INTRAVENOUS | Status: AC

## 2019-05-17 MED ORDER — FUROSEMIDE 10 MG/ML IJ SOLN
20.0000 mg | Freq: Four times a day (QID) | INTRAMUSCULAR | Status: AC
  Administered 2019-05-17 (×3): 20 mg via INTRAVENOUS
  Filled 2019-05-17 (×3): qty 2

## 2019-05-17 MED ORDER — ROCURONIUM BROMIDE 10 MG/ML (PF) SYRINGE
PREFILLED_SYRINGE | INTRAVENOUS | Status: AC
  Filled 2019-05-17: qty 10

## 2019-05-17 MED ORDER — FENTANYL BOLUS VIA INFUSION
50.0000 ug | INTRAVENOUS | Status: DC | PRN
  Administered 2019-05-17 – 2019-05-18 (×11): 50 ug via INTRAVENOUS
  Filled 2019-05-17: qty 50

## 2019-05-17 MED ORDER — VITAL HIGH PROTEIN PO LIQD
1000.0000 mL | ORAL | Status: DC
  Administered 2019-05-17 – 2019-05-23 (×8): 1000 mL

## 2019-05-17 MED ORDER — FENTANYL 2500MCG IN NS 250ML (10MCG/ML) PREMIX INFUSION
50.0000 ug/h | INTRAVENOUS | Status: DC
  Administered 2019-05-17: 50 ug/h via INTRAVENOUS
  Administered 2019-05-18: 200 ug/h via INTRAVENOUS
  Filled 2019-05-17 (×2): qty 250

## 2019-05-17 MED ORDER — CHLORHEXIDINE GLUCONATE CLOTH 2 % EX PADS
6.0000 | MEDICATED_PAD | Freq: Every morning | CUTANEOUS | Status: DC
  Administered 2019-05-17 – 2019-05-20 (×4): 6 via TOPICAL

## 2019-05-17 MED ORDER — HALOPERIDOL LACTATE 5 MG/ML IJ SOLN
4.0000 mg | Freq: Once | INTRAMUSCULAR | Status: AC
  Administered 2019-05-17: 4 mg via INTRAVENOUS
  Filled 2019-05-17: qty 1

## 2019-05-17 MED ORDER — INSULIN REGULAR(HUMAN) IN NACL 100-0.9 UT/100ML-% IV SOLN
INTRAVENOUS | Status: DC
  Administered 2019-05-17: 2.6 [IU]/h via INTRAVENOUS
  Filled 2019-05-17: qty 100

## 2019-05-17 MED ORDER — POTASSIUM CHLORIDE 10 MEQ/100ML IV SOLN
10.0000 meq | INTRAVENOUS | Status: AC
  Administered 2019-05-17 (×5): 10 meq via INTRAVENOUS
  Filled 2019-05-17 (×5): qty 100

## 2019-05-17 MED ORDER — MIDAZOLAM HCL 2 MG/2ML IJ SOLN
2.0000 mg | Freq: Once | INTRAMUSCULAR | Status: AC
  Administered 2019-05-17: 2 mg via INTRAVENOUS

## 2019-05-17 MED ORDER — MIDAZOLAM HCL 2 MG/2ML IJ SOLN
INTRAMUSCULAR | Status: AC
  Filled 2019-05-17: qty 2

## 2019-05-17 MED ORDER — ADULT MULTIVITAMIN W/MINERALS CH
1.0000 | ORAL_TABLET | Freq: Every day | ORAL | Status: DC
  Administered 2019-05-18 – 2019-05-23 (×6): 1
  Filled 2019-05-17 (×6): qty 1

## 2019-05-17 NOTE — Progress Notes (Signed)
Spoke with daughter over the phone. Updated on patients condition and increase in oxygen demands.

## 2019-05-17 NOTE — Procedures (Signed)
OGT Placement  OGT placement under direct laryngoscopy and verified by auscultation  Alyson Reedy, M.D. [facility] Pulmonary/Critical Care Medicine. Pager: [phone]. After hours pager: [phone].

## 2019-05-17 NOTE — Progress Notes (Signed)
PROGRESS NOTE  Lance Mason Euless ZJQ:[id number] DOB: Apr 20, 1956 DOA: 05-31-2019 PCP: Patient, No Pcp Per   LOS: 3 days   Brief Narrative / Interim history: 63 year old male with history of diabetes, hyperlipidemia, was admitted to the hospital 05-31-2019 with shortness of breath, found to have hypoxic respiratory failure due to COVID-19 pneumonia.  He was admitted to the ICU requiring nonrebreather mask.  He received a dose of Actemra on 5/27 and was started on a course of Remdesivir.  He under went a CT angiogram on 5/27 with diffuse bilateral patchy airspace disease with groundglass appearance, no PE  Subjective: -Laying in prone position when I entered the room, upon sitting on his right side for me to interact his oxygen sats dropped into the 70s.  He complains of shortness of breath with minimal activity.  Assessment & Plan: Principal Problem:   Acute respiratory failure with hypoxia (HCC) Active Problems:   Type 2 diabetes mellitus without complication, without long-term current use of insulin (HCC)   Pure hypercholesterolemia   COVID-19 virus infection   CAP (community acquired pneumonia)   Principal Problem Acute Hypoxic Respiratory Failure due to Covid-19 Viral Illness -Continue supportive treatment, patient's respiratory status remains critical care requiring 100% oxygen, maintaining O2 sats in the mid to upper 80s.  Desats into the 70s with minimal movement. -Discussed with Dr. Molli Knock with critical care, try to avoid intubation -He is on Remdesivir and steroids, received Actemra, he is a candidate for convalescent plasma, discussed with family, patient currently starts to be a little bit more confused and will try to consent wife who is next decision maker -Continue broad-spectrum antibiotics  The treatment plan and use of medications and known side effects were discussed with patient/family, they were clearly explained that there is no proven definitive treatment for  COVID-19 infection, any medications used here are based on published clinical articles/anecdotal data which are not peer-reviewed or randomized control trials.  Complete risks and long-term side effects are unknown, however in the best clinical judgment they seem to be of some clinical benefit rather than medical risks.  Patient agree with the treatment plan and want to receive the given medications.  COVID-19 Labs  Recent Labs    2019/05/31 1815  05/15/19 0455 05/16/19 0455 05/17/19 0125  DDIMER 3.48*  --   --   --  12.99*  FERRITIN  --    < > 1,910* 3,595* 2,413*  LDH 425*  --   --   --  762*  CRP  --    < > 33.3* 29.3* 15.0*   < > = values in this interval not displayed.    Lab Results  Component Value Date   SARSCOV2NAA DETECTED (A) 05/08/2019    Type 2 diabetes mellitus -Hold oral agents, continue sliding scale  Elevated LFTs -Possibly related to Covid versus Actemra, peaked and now trending down.  We will continue to monitor.   Scheduled Meds:  Chlorhexidine Gluconate Cloth  6 each Topical q morning - 10a   dextromethorphan-guaiFENesin  1 tablet Oral BID   famotidine  20 mg Oral BID   furosemide  20 mg Intravenous Q6H   gabapentin  300 mg Oral TID   insulin aspart  0-9 Units Subcutaneous Q4H   methylPREDNISolone (SOLU-MEDROL) injection  40 mg Intravenous Q6H   multivitamin with minerals  1 tablet Oral Daily   potassium chloride  40 mEq Oral TID   Ensure Max Protein  11 oz Oral BID   Continuous Infusions:  azithromycin 500 mg (05/16/19 2143)   ceFEPime (MAXIPIME) IV 2 g (05/17/19 0556)   heparin 1,300 Units/hr (05/17/19 0856)   remdesivir 100 mg in NS 250 mL 100 mg (05/16/19 1215)   PRN Meds:.acetaminophen, morphine injection, ondansetron (ZOFRAN) IV  DVT prophylaxis: Lovenox Code Status: Full code Family Communication: Discussed with patient as well as granddaughter over the phone Disposition Plan: Remain in ICU  Consultants:   Critical  care  Procedures:   None   Antimicrobials:  Vancomycin 5/26 >> 5/28  Cefepime 5/26 >>  Azithromycin 5/26 >>   Objective: Vitals:   05/17/19 0700 05/17/19 0757 05/17/19 0800 05/17/19 0952  BP: (!) 150/84 (!) 150/84 110/68 (!) 143/84  Pulse: (!) 115 (!) 121 (!) 116 (!) 148  Resp: (!) 40 (!) 23 (!) 44 (!) 38  Temp:   97.9 F (36.6 C)   TempSrc:   Oral   SpO2: (!) 83% (!) 82% (!) 81% 97%  Weight:      Height:        Intake/Output Summary (Last 24 hours) at 05/17/2019 1012 Last data filed at 05/17/2019 0700 Gross per 24 hour  Intake 822.39 ml  Output 1825 ml  Net -1002.61 ml   Filed Weights   04/25/2019 2354  Weight: 105.5 kg    Examination:  Constitutional: Remains tachypneic, seen in prone position Eyes: No scleral icterus ENMT: Mucous membranes are moist.  Respiratory: Overall distant breath sounds, no wheezing, increased respiratory effort with accessory muscle use Cardiovascular: Regular rate and rhythm, no murmurs appreciated Abdomen: Nontender, bowel sounds positive Musculoskeletal: no clubbing / cyanosis.  Skin: No rashes seen Neurologic: No focal deficits Psychiatric: Alert and oriented x3 on my evaluation but later confused   Data Reviewed: I have independently reviewed following labs and imaging studies   CBC: Recent Labs  Lab 05/06/2019 1815 05/15/19 0455 05/15/19 0911 05/16/19 0455 05/16/19 0620 05/17/19 0125  WBC 7.5 7.3  --  8.7  --  9.4  NEUTROABS 6.0 5.4  --  6.9  --  8.3*  HGB 13.2 13.2 12.2* 14.3 14.3 14.8  HCT 37.9* 39.6 36.0* 41.7 42.0 44.8  MCV 91.1 91.7  --  92.1  --  91.8  PLT 210 245  --  312  --  316   Basic Metabolic Panel: Recent Labs  Lab 05/05/2019 1815 05/15/19 0455 05/15/19 0911 05/16/19 0455 05/16/19 0620 05/17/19 0125  NA 133* 136 138 139 139 141  K 3.8 4.0 3.6 3.8 3.8 3.8  CL 102 102  --  105  --  103  CO2 23 25  --  24  --  24  GLUCOSE 179* 146*  --  169*  --  252*  BUN 12 10  --  10  --  19  CREATININE 0.75  0.68  --  0.65  --  0.79  CALCIUM 7.8* 7.8*  --  7.8*  --  8.1*  MG  --  2.2  --  2.2  --  2.5*  PHOS  --  2.4*  --  3.1  --  2.3*   GFR: Estimated Creatinine Clearance: 105.7 mL/min (by C-G formula based on SCr of 0.79 mg/dL). Liver Function Tests: Recent Labs  Lab 05/17/2019 1815 05/15/19 0455 05/16/19 0455 05/17/19 0125  AST 114* 106* 235* 118*  ALT 88* 79* 165* 136*  ALKPHOS 96 100 160* 183*  BILITOT 0.5 0.5 0.5 0.4  PROT 7.4 7.3 7.0 7.1  ALBUMIN 2.9* 2.7* 2.4* 2.6*   No results  for input(s): LIPASE, AMYLASE in the last 168 hours. No results for input(s): AMMONIA in the last 168 hours. Coagulation Profile: No results for input(s): INR, PROTIME in the last 168 hours. Cardiac Enzymes: Recent Labs  Lab 04/25/2019 1815 05/08/2019 2018 05/15/19 0450 05/15/19 0455 05/16/19 0455 05/17/19 0125  CKTOTAL  --  62  --  74 64 115  CKMB  --  1.7  --   --   --   --   TROPONINI <0.03  --  <0.03  --   --   --    BNP (last 3 results) No results for input(s): PROBNP in the last 8760 hours. HbA1C: No results for input(s): HGBA1C in the last 72 hours. CBG: Recent Labs  Lab 05/16/19 1631 05/16/19 1938 05/16/19 2358 05/17/19 0331 05/17/19 0807  GLUCAP 312* 306* 278* 270* 260*   Lipid Profile: Recent Labs    04/24/2019 2018  TRIG 267*   Thyroid Function Tests: No results for input(s): TSH, T4TOTAL, FREET4, T3FREE, THYROIDAB in the last 72 hours. Anemia Panel: Recent Labs    05/16/19 0455 05/17/19 0125  FERRITIN 3,595* 2,413*   Urine analysis:    Component Value Date/Time   COLORURINE YELLOW 05/28/2016 2056   APPEARANCEUR CLEAR 05/28/2016 2056   LABSPEC 1.041 (H) 05/28/2016 2056   PHURINE 5.0 05/28/2016 2056   GLUCOSEU >1000 (A) 05/28/2016 2056   HGBUR NEGATIVE 05/28/2016 2056   BILIRUBINUR negative 06/19/2018 1601   KETONESUR negative 06/19/2018 1601   KETONESUR 15 (A) 05/28/2016 2056   PROTEINUR negative 06/19/2018 1601   PROTEINUR NEGATIVE 05/28/2016 2056    UROBILINOGEN 0.2 06/19/2018 1601   NITRITE Negative 06/19/2018 1601   NITRITE NEGATIVE 05/28/2016 2056   LEUKOCYTESUR Negative 06/19/2018 1601   Sepsis Labs: Invalid input(s): PROCALCITONIN, LACTICIDVEN  Recent Results (from the past 240 hour(s))  Novel Coronavirus,NAA,(SEND-OUT TO REF LAB - TAT 24-48 hrs); Hosp Order     Status: Abnormal   Collection Time: 05/08/19  4:43 PM  Result Value Ref Range Status   SARS-CoV-2, NAA DETECTED (A) NOT DETECTED Final    Comment: CRITICAL RESULT CALLED TO, READ BACK BY AND VERIFIED WITH: Vernard Gambles, NCED RN, AT 1625 ON 05/09/19 BY C. JESSUP, MLT. (NOTE) This test was developed and its performance characteristics determined by [employer]. This test has not been FDA cleared or approved. This test has been authorized by FDA under an Emergency Use Authorization (EUA). This test is only authorized for the duration of time the declaration that circumstances exist justifying the authorization of the emergency use of in vitro diagnostic tests for detection of SARS-CoV-2 virus and/or diagnosis of COVID-19 infection under section 564(b)(1) of the Act, 21 U.S.C. 161WRU-0(A)(5), unless the authorization is terminated or revoked sooner. When diagnostic testing is negative, the possibility of a false negative result should be considered in the context of a patient's recent exposures and the presence of clinical signs and symptoms consistent with COVID-19. An individual without symptoms of COVID-19  and who is not shedding SARS-CoV-2 virus would expect to have a negative (not detected) result in this assay. Performed At: [facility] [address] [place], [place] [id number] Jolene Schimke MD NW:[id number]    Coronavirus Source NASOPHARYNGEAL  Final    Comment: Performed at [facility], [address], [place], [place] [address]  Culture, blood (Routine X 2) w Reflex to ID Panel     Status: None (Preliminary result)   Collection  Time: 05/19/2019  8:18 PM  Result Value  Ref Range Status   Specimen Description   Final    BLOOD BLOOD LEFT FOREARM Performed at [facility], [address] [place], [place] [address]    Special Requests   Final    BOTTLES DRAWN AEROBIC AND ANAEROBIC Blood Culture adequate volume Performed at [facility], [address] [place], [place] [address]    Culture   Final    NO GROWTH 2 DAYS Performed at [facility], [address] [place], [place] [address]    Report Status PENDING  Incomplete  MRSA PCR Screening     Status: None   Collection Time: 05/15/19  1:55 PM  Result Value Ref Range Status   MRSA by PCR NEGATIVE NEGATIVE Final    Comment:        The GeneXpert MRSA Assay (FDA approved for NASAL specimens only), is one component of a comprehensive MRSA colonization surveillance program. It is not intended to diagnose MRSA infection nor to guide or monitor treatment for MRSA infections. Performed at [facility], [address] [place], [place] [address]       Radiology Studies: Dg Chest Port 1 View  Result Date: 05/17/2019 CLINICAL DATA:  Respiratory failure. EXAM: PORTABLE CHEST 1 VIEW COMPARISON:  CT 05/15/2019.  Chest x-ray 07/11/2019. FINDINGS: Stable cardiomegaly. Diffuse severe bilateral pulmonary infiltrates/edema again noted. Similar findings noted on prior CT. No pleural effusion or pneumothorax. No acute bony abnormality. IMPRESSION: 1. Diffuse severe bilateral pulmonary infiltrates/edema again noted. Similar findings noted on prior CT. No interim improvement. 2.  Stable cardiomegaly. Electronically Signed   By: Maisie Fushomas  Register   On: 05/17/2019 07:44    Pamella Pertostin Filemon Breton, MD, PhD [facility]  Contact via  [email or web address]  [facility] Office Info P: [phone]  F: [phone]

## 2019-05-17 NOTE — Progress Notes (Signed)
NAME:  Lance Mason, MRN:  [id number], DOB:  [birth year], LOS: 3 ADMISSION DATE:  05/05/2019, CONSULTATION DATE:  05/15/2019 REFERRING MD:  Dr. Kerry HoughMemon, CHIEF COMPLAINT:  Dyspnea   Brief History   63 y/o male with DM2, HTN admitted on 5/26 with acute on chronic respiratory failure with hypoxemia due to COVID 19 pneumonia.  History of present illness   [age] year old male with a past medical history significant for hypertension and hyperlipidemia was admitted to our facility on May 14, 2019 in the setting of worsening shortness of breath.  He said that he has developed increasing shortness of breath and cough with some mucus production for about a week.  He has smoked intermittently over the years but he says he does not smoke every day.  He says that he was diagnosed with coronavirus approximately 5 days prior to admission.  After admission he was noted to be hypoxemic.  He was treated with a course of Actemra and started on Remdesivir.  Because of increasing oxygen requirements pulmonary and critical care medicine was consulted for close monitoring.  He has been moved to the intensive care unit.  Currently he is breathing with a 100% nonrebreather mask.  He says he feels like his breathing may be getting better.  Past Medical History  Hypertension Hyperlipidemia DM2  Significant Hospital Events   5/26 admission  Consults:  Pulmonary   Procedures:    Significant Diagnostic Tests:  CT angiogram chest 5/27 > diffuse bilateral patchy airspace disease with groundglass upper lobes predominant, no pulmonary embolism  Micro Data:  5/21 SARS-COV-2 > positive  Antimicrobials:  5/26 blood culture>   Interim history/subjective:  Worsening hypoxemia overnight, flow up to 60 with 100% FiO2 Responded some to prone positioning  Objective   Blood pressure 110/68, pulse (!) 116, temperature 97.9 F (36.6 C), temperature source Oral, resp. rate (!) 44, height 5\' 5"  (1.651 m), weight 105.5 kg,  SpO2 (!) 81 %.    FiO2 (%):  [100 %] 100 %   Intake/Output Summary (Last 24 hours) at 05/17/2019 [id number] Last data filed at 05/17/2019 0700 Gross per 24 hour  Intake 822.39 ml  Output 2350 ml  Net -1527.61 ml   Filed Weights   05/05/2019 2354  Weight: 105.5 kg   Examination:  General:  Mild respiratory distress when being moved HENT: [place]/AT, PERRL, EOM-I and MMM PULM: Bibasilar crackles CV: RRR, Nl S1/S2 and -M/R/G GI: Soft, NT, ND and +BS MSK: WNL Neuro: Alert and interactive, moving all ext to command  I reviewed CXR myself, infiltrate noted  Resolved Hospital Problem list     Assessment & Plan:  Acute coronavirus pneumonia with acute respiratory failure with hypoxemia Prone patient CRP down to 15 so that is reassuring Maintain in the ICU NPO except for meds given concern for intubation No NIV for now Continue using Remdesivir per protocol Continue Actemra Add steroids given clinical deterioration of O2 demand Lasix 20 mg IV q6 x3 doses K-dur 40 x2 doses  Mediastinal lymphadenopathy: likely reactive > will need f/u CT in 3 months  Best practice:  Per [facility]  Labs   CBC: Recent Labs  Lab 04/21/2019 1815 05/15/19 0455 05/15/19 0911 05/16/19 0455 05/16/19 0620 05/17/19 0125  WBC 7.5 7.3  --  8.7  --  9.4  NEUTROABS 6.0 5.4  --  6.9  --  8.3*  HGB 13.2 13.2 12.2* 14.3 14.3 14.8  HCT 37.9* 39.6 36.0* 41.7 42.0 44.8  MCV 91.1 91.7  --  92.1  --  91.8  PLT 210 245  --  312  --  316    Basic Metabolic Panel: Recent Labs  Lab 04/26/2019 1815 05/15/19 0455 05/15/19 0911 05/16/19 0455 05/16/19 0620 05/17/19 0125  NA 133* 136 138 139 139 141  K 3.8 4.0 3.6 3.8 3.8 3.8  CL 102 102  --  105  --  103  CO2 23 25  --  24  --  24  GLUCOSE 179* 146*  --  169*  --  252*  BUN 12 10  --  10  --  19  CREATININE 0.75 0.68  --  0.65  --  0.79  CALCIUM 7.8* 7.8*  --  7.8*  --  8.1*  MG  --  2.2  --  2.2  --  2.5*  PHOS  --  2.4*  --  3.1  --  2.3*   GFR: Estimated  Creatinine Clearance: 105.7 mL/min (by C-G formula based on SCr of 0.79 mg/dL). Recent Labs  Lab 05/07/2019 1815 05/19/2019 2015 05/15/19 0455 05/16/19 0455 05/17/19 0125  PROCALCITON 0.92  --   --   --   --   WBC 7.5  --  7.3 8.7 9.4  LATICACIDVEN  --  1.5  --   --   --     Liver Function Tests: Recent Labs  Lab 04/30/2019 1815 05/15/19 0455 05/16/19 0455 05/17/19 0125  AST 114* 106* 235* 118*  ALT 88* 79* 165* 136*  ALKPHOS 96 100 160* 183*  BILITOT 0.5 0.5 0.5 0.4  PROT 7.4 7.3 7.0 7.1  ALBUMIN 2.9* 2.7* 2.4* 2.6*   No results for input(s): LIPASE, AMYLASE in the last 168 hours. No results for input(s): AMMONIA in the last 168 hours.  ABG    Component Value Date/Time   PHART 7.432 05/16/2019 0620   PCO2ART 31.9 (L) 05/16/2019 0620   PO2ART 54.0 (L) 05/16/2019 0620   HCO3 21.3 05/16/2019 0620   TCO2 22 05/16/2019 0620   ACIDBASEDEF 2.0 05/16/2019 0620   O2SAT 90.0 05/16/2019 0620     Coagulation Profile: No results for input(s): INR, PROTIME in the last 168 hours.  Cardiac Enzymes: Recent Labs  Lab 05/10/2019 1815 04/30/2019 2018 05/15/19 0450 05/15/19 0455 05/16/19 0455 05/17/19 0125  CKTOTAL  --  62  --  74 64 115  CKMB  --  1.7  --   --   --   --   TROPONINI <0.03  --  <0.03  --   --   --     HbA1C: Hemoglobin A1C  Date/Time Value Ref Range Status  06/19/2018 04:01 PM 9.1 (A) 4.0 - 5.6 % Final  01/02/2017 06:51 PM 12.8  Final    CBG: Recent Labs  Lab 05/16/19 1631 05/16/19 1938 05/16/19 2358 05/17/19 0331 05/17/19 0807  GLUCAP 312* 306* 278* 270* 260*   The patient is critically ill with multiple organ systems failure and requires high complexity decision making for assessment and support, frequent evaluation and titration of therapies, application of advanced monitoring technologies and extensive interpretation of multiple databases.   Critical Care Time devoted to patient care services described in this note is  32  Minutes. This time  reflects time of care of this signee Dr Koren Bound. This critical care time does not reflect procedure time, or teaching time or supervisory time of PA/NP/Med student/Med Resident etc but could involve care discussion time.  Alyson Reedy, M.D. [facility] Pulmonary/Critical Care Medicine. Pager: [phone].  After hours pager: [phone].

## 2019-05-17 NOTE — Progress Notes (Signed)
Spoke with MD regarding clarification of ketamine order and other medications.  MD wants to d/c propofol, continue fentanyl and start ketamine.  Current SBP 89 with MAP of 65.  MD okay with SBP right now but said levophed could be started if MAP goes below 65.

## 2019-05-17 NOTE — Progress Notes (Signed)
[facility] NOTE  Pharmacy Consult for heparin; Cefepime  Indication: R/o PE in setting of COVID; Sepsis   No Known Allergies  Patient Measurements: Height: '5\' 5"'  (165.1 cm) Weight: 232 lb 9.4 oz (105.5 kg) IBW/kg (Calculated) : 61.5 Heparin Dosing Weight: 84 kg   Vital Signs: BP: 150/84 (05/29 0700) Pulse Rate: 115 (05/29 0700)  Labs: Recent Labs    05/05/2019 1815  05/08/2019 2018 05/15/19 0450 05/15/19 0455  05/16/19 0455 05/16/19 0620 05/17/19 0125  HGB 13.2  --   --   --  13.2   < > 14.3 14.3 14.8  HCT 37.9*  --   --   --  39.6   < > 41.7 42.0 44.8  PLT 210  --   --   --  245  --  312  --  316  CREATININE 0.75  --   --   --  0.68  --  0.65  --  0.79  CKTOTAL  --    < > 62  --  74  --  64  --  115  CKMB  --   --  1.7  --   --   --   --   --   --   TROPONINI <0.03  --   --  <0.03  --   --   --   --   --    < > = values in this interval not displayed.    Estimated Creatinine Clearance: 105.7 mL/min (by C-G formula based on SCr of 0.79 mg/dL).   Medical History: Past Medical History:  Diagnosis Date  . Cataract   . Diabetes mellitus without complication ([place])   . Hyperlipidemia   . Hypertension   . Pleural plaque 10/05/2015   calcified pleural plaque bilaterally on CT chest    Medications:  Medications Prior to Admission  Medication Sig Dispense Refill Last Dose  . [EXPIRED] benzonatate (TESSALON) 200 MG capsule Take 1 capsule (200 mg total) by mouth 3 (three) times daily as needed for up to 7 days for cough. 28 capsule 0 Past Week at Unknown time  . metFORMIN (GLUCOPHAGE) 1000 MG tablet Take 1 tablet (1,000 mg total) by mouth 2 (two) times daily with a meal. 180 tablet 1 unk  . ondansetron (ZOFRAN) 4 MG tablet Take 1 tablet (4 mg total) by mouth every 8 (eight) hours as needed for nausea or vomiting. 12 tablet 0 Past Week at Unknown time  . ACCU-CHEK AVIVA PLUS test strip Check sugar once daily; spanish label. 100 each 7 Taking  .  ACCU-CHEK SOFTCLIX LANCETS lancets Check sugar once daily 100 each 3 Taking  . atorvastatin (LIPITOR) 20 MG tablet Take 1 tablet (20 mg total) by mouth daily. 90 tablet 1   . blood glucose meter kit and supplies Dispense based on patient and insurance preference. Use up to four times daily as directed. (FOR ICD-9 250.00, 250.01). 1 each 0 Taking  . Blood Glucose Monitoring Suppl (ACCU-CHEK AVIVA PLUS) w/Device KIT    Taking  . gabapentin (NEURONTIN) 300 MG capsule Take 1 capsule (300 mg total) by mouth 3 (three) times daily. 90 capsule 1   . glipiZIDE (GLUCOTROL XL) 10 MG 24 hr tablet Take 1 tablet (10 mg total) by mouth daily with breakfast. 90 tablet 1     Assessment: 81 YOM who presented with SOB and fevers found to be COVID +. Was started on IV antibiotics for Sepsis. Now with worsening hypoxia and  bump in D-dimer to 12.99. Pharmacy consulted to add IV heparin to r/o VTE treatment  AC: H/H and Plt wnl. Currently on subQ Lovenox for VTE prophylaxis   ID: On D#3 of Cefepime and azithromycin. WBC wnl, afebrile. PCT 0.92 on admission.   5/26 cefepime >>  5/26 zmax >>  5/26 vancomycin >>5/28  5/26 BCx2 >> ngtd  5/20 COVID positive  5/27 MRSA neg   Goal of Therapy:  Heparin level 0.3-0.7 units/ml Monitor platelets by anticoagulation protocol: Yes   Plan:  -Continue cefepime 2 gm IV Q 8 hours -Azithromycin per MD -Give heparin 4000 units IV bolus, then start heparin 1300 units/hr  -F/u 6 hr HL -Monitor daily HL, CBC and s/s of bleeding   Albertina Parr, PharmD., BCPS Clinical Pharmacist Clinical phone for 05/17/19 until 5pm: [phone]

## 2019-05-17 NOTE — Procedures (Signed)
Intubation Procedure Note Ras Kollman [id number] 1956-06-12  Procedure: Intubation Indications: Airway protection and maintenance  Procedure Details Consent: Unable to obtain consent because of emergent medical necessity. Time Out: Verified patient identification, verified procedure, site/side was marked, verified correct patient position, special equipment/implants available, medications/allergies/relevent history reviewed, required imaging and test results available.  Performed  Maximum sterile technique was used including antiseptics, gloves, gown, hand hygiene, mask and sheet.  MAC    Evaluation Hemodynamic Status: BP stable throughout; O2 sats: stable throughout Patient's Current Condition: stable Complications: No apparent complications Patient did tolerate procedure well. Chest X-ray ordered to verify placement.  CXR: pending.   Jennet Maduro 05/17/2019

## 2019-05-17 NOTE — Procedures (Signed)
Central Venous Catheter Insertion Procedure Note Lance Mason [id number] October 04, 1956  Procedure: Insertion of Central Venous Catheter Indications: Assessment of intravascular volume, Drug and/or fluid administration and Frequent blood sampling  Procedure Details Consent: Unable to obtain consent because of altered level of consciousness. Time Out: Verified patient identification, verified procedure, site/side was marked, verified correct patient position, special equipment/implants available, medications/allergies/relevent history reviewed, required imaging and test results available.  Performed  Maximum sterile technique was used including antiseptics, cap, gloves, gown, hand hygiene, mask and sheet. Skin prep: Chlorhexidine; local anesthetic administered A antimicrobial bonded/coated triple lumen catheter was placed in the left subclavian vein using the Seldinger technique.  Evaluation Blood flow good Complications: No apparent complications Patient did tolerate procedure well. Chest X-ray ordered to verify placement.  CXR: pending.  Lovene Maret 05/17/2019, 1:11 PM

## 2019-05-17 NOTE — Progress Notes (Signed)
Patient intubated for increased respiratory distress.  Patient intubated per MD with a 7.5 ETT.  Placed on Servo PRVC RR 22, VT 440, FiO2 100%, PEEP 16.  Will continue to monitor.

## 2019-05-17 NOTE — Progress Notes (Signed)
With use of interpreter, we told patient that we needed to secure his belongings so they dont get misplaced.  Patients belongings (wallet, cellphone, pants, tshirt and underwear) in bag and patient will not give the bag to [place].  With use of interpreter we let patient know that belongings may get lost if he keeps them in bed with him. Patient said he wants to keep belongings with him.

## 2019-05-17 NOTE — Progress Notes (Signed)
I, Pamella Pert, MD, consented Subject Lance Mason (male, Date of Birth [birth year], 63 y.o.) and with diagnosis of COVID-19, in the [facility] Expanded Access Program (EAP) Research Protocol for [employer] against COVID-19.  The consent took place under following circumstances.   Subject Capacity assessed by this investigator as:  Absence of emotional and mental capacity to consent (i.e. delirium, coma, ventilator).  Consent took place in the following setting(s):  Via video and/or telephone (Time of call: 1200)   The following were present for the consent process:  Financial controller, identified as granddaughter / wife, has signed and dated the consent.  The consent has been added to subject's chart.   A copy of the cover letter and signed consent document was provided to subject/LAR.  The original signed consent document has been placed in the subject's physical chart and will be scanned into the electronic medical record upon discharge.  Statement of acknowledgement that the following was discussed with the subject/LAR:    1) Discussed the purpose of the research and procedures  2) Discussed risks and benefits and uncertainties of study participation 3) Discussed subject's responsibilities  4) Discussed the measures in place to maintain subject's confidentiality while a participant on the trial  5) Discussed alternatives to study participation.   6) Discussed study participation is voluntary and that the subject's care would not be jeopardized if they declined participation in the study.   7) Discussed freedom to withdraw at any time.   8) All subject/LAR questions were answered to their satisfaction.   9) In case of emergency consent, investigator agreed to discuss with subject/LAR at earliest available opportunity when the subject stabilizes and/or LAR can be located.     Final Investigator Signature  Omah Dewalt Cooter, [place] [id number]   Date: 05/17/2019 and 1:24 PM

## 2019-05-17 NOTE — Progress Notes (Signed)
Inpatient Diabetes Program Recommendations  AACE/ADA: New Consensus Statement on Inpatient Glycemic Control (2015)  Target Ranges:  Prepandial:   less than 140 mg/dL      Peak postprandial:   less than 180 mg/dL (1-2 hours)      Critically ill patients:  140 - 180 mg/dL   Lab Results  Component Value Date   GLUCAP 260 (H) 05/17/2019   HGBA1C 9.1 (A) 06/19/2018    Review of Glycemic Control  Diabetes history: Type 2 Outpatient Diabetes medications: Metformin, glipizide Current orders for Inpatient glycemic control: Novolog SENSITIVE correction scale every 4 hours.  Inpatient Diabetes Program Recommendations:    Noted that blood sugars continue to be greater than 200 mg/dl. Recommend increasing Novolog correction scale to MODERATE (0-15 units) every 4 hours.  Smith Mince RN BSN CDE Diabetes Coordinator Pager: [phone]  8am-5pm

## 2019-05-17 NOTE — Progress Notes (Signed)
At approx 1230 pt woke and attempted to pull off my CAPR, while trying to console patient he tried to sit up in bed and said "vamos" and was trying to leave bed.  Tried to assure patient to stay in bed for his safety.  Patient then ripped off bipap mask from face and again attempted to exit the bed.  Called for help, when another RN arrived, NRB was placed on patient and RT was called.  Pt then ripped off NRB.  Pt attempting to getting out of bed, pull out IVs and take off oxygen. MD called and came to bedside to emergently intubate patient and place CVL due to lack of access.  Pt stabalized.

## 2019-05-17 NOTE — Progress Notes (Addendum)
Nutrition Follow-up  DOCUMENTATION CODES:   Obesity unspecified  INTERVENTION:   Begin TF via OGT:   Vital High Protein at 60 ml/h (1440 ml per day)   Provides 1440 kcal (1524 kcal total with lipid from Propofol), 126 gm protein, 1204 ml free water daily  Recommend adjust insulin regimen based on Diabetes Coordinator recommendations.  NUTRITION DIAGNOSIS:   Increased nutrient needs related to acute illness(COVID-19) as evidenced by estimated needs.  Ongoing  GOAL:   Provide needs based on ASPEN/SCCM guidelines  Unmet  MONITOR:   Vent status, Labs, Skin, I & O's  REASON FOR ASSESSMENT:   Malnutrition Screening Tool    ASSESSMENT:   63 yo Spanish speaking male with PMH of DM, HTN, HLD who was admitted with SOB and cough x 1 week. COVID-19 positive on 5/21.  Patient required intubation earlier today for worsening respiratory status. MD placed OGT after intubation. Spoke with attending MD, okay for RD to order TF.   Patient is currently intubated on ventilator support Temp (24hrs), Avg:97.9 F (36.6 C), Min:97.9 F (36.6 C), Max:97.9 F (36.6 C)  Propofol: 3.2 ml/hr providing 84 kcal from lipid.  Labs reviewed. Potassium 3.4 (L), phosphorus 2.3 (L) CBG's: 270-260-296  Medications reviewed and include KCl for repletion, Lasix, Novolog, Solu-medrol, MVI, propofol.   Diet Order:   Diet Order            Diet NPO time specified  Diet effective now              EDUCATION NEEDS:   No education needs have been identified at this time  Skin:  Skin Assessment: Reviewed RN Assessment  Last BM:  5/25  Height:   Ht Readings from Last 1 Encounters:  05/16/2019 5\' 5"  (1.651 m)    Weight:   Wt Readings from Last 1 Encounters:  05/12/2019 105.5 kg    Ideal Body Weight:  61.8 kg  BMI:  Body mass index is 38.7 kg/m.  Estimated Nutritional Needs:   Kcal:  1160-1480  Protein:  124 gm  Fluid:  2 L    Joaquin Courts, RD, LDN, CNSC Pager  [phone] After Hours Pager [phone]

## 2019-05-17 NOTE — Progress Notes (Signed)
Multiple calls throughout the day for desaturation, agitation and hypoxemic respiratory failure.  After attempting multiple maneuvers to include BiPAP decision was made to intubate patient.  Also acquired consent for convalescent plasma.  Patient was intubated and TLC placed for lack of access.  OGT placed.  Family notified over the phone.  The patient is critically ill with multiple organ systems failure and requires high complexity decision making for assessment and support, frequent evaluation and titration of therapies, application of advanced monitoring technologies and extensive interpretation of multiple databases.   Critical Care Time devoted to patient care services described in this note is  60  Minutes. This time reflects time of care of this signee Dr Koren Bound. This critical care time does not reflect procedure time, or teaching time or supervisory time of PA/NP/Med student/Med Resident etc but could involve care discussion time.  Alyson Reedy, M.D. [facility] Pulmonary/Critical Care Medicine. Pager: [phone]. After hours pager: [phone].

## 2019-05-18 ENCOUNTER — Inpatient Hospital Stay (HOSPITAL_COMMUNITY): Payer: BLUE CROSS/BLUE SHIELD

## 2019-05-18 DIAGNOSIS — A419 Sepsis, unspecified organism: Secondary | ICD-10-CM

## 2019-05-18 LAB — POCT I-STAT 7, (LYTES, BLD GAS, ICA,H+H)
Acid-base deficit: 1 mmol/L (ref 0.0–2.0)
Acid-base deficit: 3 mmol/L — ABNORMAL HIGH (ref 0.0–2.0)
Acid-base deficit: 3 mmol/L — ABNORMAL HIGH (ref 0.0–2.0)
Bicarbonate: 24 mmol/L (ref 20.0–28.0)
Bicarbonate: 23.4 mmol/L (ref 20.0–28.0)
Bicarbonate: 22.1 mmol/L (ref 20.0–28.0)
Calcium, Ion: 1.16 mmol/L (ref 1.15–1.40)
Calcium, Ion: 1.21 mmol/L (ref 1.15–1.40)
Calcium, Ion: 1.12 mmol/L — ABNORMAL LOW (ref 1.15–1.40)
HCT: 33 % — ABNORMAL LOW (ref 39.0–52.0)
HCT: 37 % — ABNORMAL LOW (ref 39.0–52.0)
HCT: 40 % (ref 39.0–52.0)
Hemoglobin: 11.2 g/dL — ABNORMAL LOW (ref 13.0–17.0)
Hemoglobin: 12.6 g/dL — ABNORMAL LOW (ref 13.0–17.0)
Hemoglobin: 13.6 g/dL (ref 13.0–17.0)
O2 Saturation: 100 %
O2 Saturation: 74 %
O2 Saturation: 95 %
Patient temperature: 98.2
Potassium: 4 mmol/L (ref 3.5–5.1)
Potassium: 4.4 mmol/L (ref 3.5–5.1)
Potassium: 4.4 mmol/L (ref 3.5–5.1)
Sodium: 152 mmol/L — ABNORMAL HIGH (ref 135–145)
Sodium: 154 mmol/L — ABNORMAL HIGH (ref 135–145)
Sodium: 147 mmol/L — ABNORMAL HIGH (ref 135–145)
TCO2: 25 mmol/L (ref 22–32)
TCO2: 25 mmol/L (ref 22–32)
TCO2: 23 mmol/L (ref 22–32)
pCO2 arterial: 40.5 mmHg (ref 32.0–48.0)
pCO2 arterial: 45.5 mmHg (ref 32.0–48.0)
pCO2 arterial: 37.7 mmHg (ref 32.0–48.0)
pH, Arterial: 7.381 (ref 7.350–7.450)
pH, Arterial: 7.319 — ABNORMAL LOW (ref 7.350–7.450)
pH, Arterial: 7.377 (ref 7.350–7.450)
pO2, Arterial: 185 mmHg — ABNORMAL HIGH (ref 83.0–108.0)
pO2, Arterial: 42 mmHg — ABNORMAL LOW (ref 83.0–108.0)
pO2, Arterial: 78 mmHg — ABNORMAL LOW (ref 83.0–108.0)

## 2019-05-18 LAB — GLUCOSE, CAPILLARY
Glucose-Capillary: 121 mg/dL — ABNORMAL HIGH (ref 70–99)
Glucose-Capillary: 136 mg/dL — ABNORMAL HIGH (ref 70–99)
Glucose-Capillary: 143 mg/dL — ABNORMAL HIGH (ref 70–99)
Glucose-Capillary: 156 mg/dL — ABNORMAL HIGH (ref 70–99)
Glucose-Capillary: 163 mg/dL — ABNORMAL HIGH (ref 70–99)
Glucose-Capillary: 164 mg/dL — ABNORMAL HIGH (ref 70–99)
Glucose-Capillary: 172 mg/dL — ABNORMAL HIGH (ref 70–99)
Glucose-Capillary: 181 mg/dL — ABNORMAL HIGH (ref 70–99)
Glucose-Capillary: 183 mg/dL — ABNORMAL HIGH (ref 70–99)
Glucose-Capillary: 190 mg/dL — ABNORMAL HIGH (ref 70–99)
Glucose-Capillary: 205 mg/dL — ABNORMAL HIGH (ref 70–99)
Glucose-Capillary: 283 mg/dL — ABNORMAL HIGH (ref 70–99)
Glucose-Capillary: 413 mg/dL — ABNORMAL HIGH (ref 70–99)
Glucose-Capillary: 440 mg/dL — ABNORMAL HIGH (ref 70–99)

## 2019-05-18 LAB — PHOSPHORUS: Phosphorus: 3.1 mg/dL (ref 2.5–4.6)

## 2019-05-18 LAB — COMPREHENSIVE METABOLIC PANEL
ALT: 88 U/L — ABNORMAL HIGH (ref 0–44)
AST: 65 U/L — ABNORMAL HIGH (ref 15–41)
Albumin: 2.5 g/dL — ABNORMAL LOW (ref 3.5–5.0)
Alkaline Phosphatase: 186 U/L — ABNORMAL HIGH (ref 38–126)
Anion gap: 10 (ref 5–15)
BUN: 37 mg/dL — ABNORMAL HIGH (ref 8–23)
CO2: 24 mmol/L (ref 22–32)
Calcium: 7.9 mg/dL — ABNORMAL LOW (ref 8.9–10.3)
Chloride: 116 mmol/L — ABNORMAL HIGH (ref 98–111)
Creatinine, Ser: 1.04 mg/dL (ref 0.61–1.24)
GFR calc Af Amer: >60 mL/min (ref >60)
GFR calc non Af Amer: >60 mL/min (ref >60)
Glucose, Bld: 164 mg/dL — ABNORMAL HIGH (ref 70–99)
Potassium: 4.2 mmol/L (ref 3.5–5.1)
Sodium: 150 mmol/L — ABNORMAL HIGH (ref 135–145)
Total Bilirubin: 0.1 mg/dL — ABNORMAL LOW (ref 0.3–1.2)
Total Protein: 6.5 g/dL (ref 6.5–8.1)

## 2019-05-18 LAB — CBC WITH DIFFERENTIAL/PLATELET
Abs Immature Granulocytes: 0.17 10*3/uL — ABNORMAL HIGH (ref 0.00–0.07)
Basophils Absolute: 0 10*3/uL (ref 0.0–0.1)
Basophils Relative: 0 %
Eosinophils Absolute: 0 10*3/uL (ref 0.0–0.5)
Eosinophils Relative: 0 %
HCT: 37.8 % — ABNORMAL LOW (ref 39.0–52.0)
Hemoglobin: 12.3 g/dL — ABNORMAL LOW (ref 13.0–17.0)
Immature Granulocytes: 1 %
Lymphocytes Relative: 6 %
Lymphs Abs: 0.8 10*3/uL (ref 0.7–4.0)
MCH: 30.9 pg (ref 26.0–34.0)
MCHC: 32.5 g/dL (ref 30.0–36.0)
MCV: 95 fL (ref 80.0–100.0)
Monocytes Absolute: 0.4 10*3/uL (ref 0.1–1.0)
Monocytes Relative: 3 %
Neutro Abs: 12.2 10*3/uL — ABNORMAL HIGH (ref 1.7–7.7)
Neutrophils Relative %: 90 %
Platelets: 230 10*3/uL (ref 150–400)
RBC: 3.98 MIL/uL — ABNORMAL LOW (ref 4.22–5.81)
RDW: 13.9 % (ref 11.5–15.5)
WBC: 13.6 10*3/uL — ABNORMAL HIGH (ref 4.0–10.5)
nRBC: 0.2 % (ref 0.0–0.2)

## 2019-05-18 LAB — HEPARIN LEVEL (UNFRACTIONATED)
Heparin Unfractionated: 0.24 IU/mL — ABNORMAL LOW (ref 0.30–0.70)
Heparin Unfractionated: 0.48 IU/mL (ref 0.30–0.70)
Heparin Unfractionated: 0.47 IU/mL (ref 0.30–0.70)

## 2019-05-18 LAB — D-DIMER, QUANTITATIVE: D-Dimer, Quant: >20 ug/mL-FEU — ABNORMAL HIGH (ref 0.00–0.50)

## 2019-05-18 LAB — C-REACTIVE PROTEIN: CRP: 7.5 mg/dL — ABNORMAL HIGH (ref <1.0)

## 2019-05-18 LAB — CK: Total CK: 88 U/L (ref 49–397)

## 2019-05-18 LAB — MAGNESIUM: Magnesium: 2.8 mg/dL — ABNORMAL HIGH (ref 1.7–2.4)

## 2019-05-18 LAB — TRIGLYCERIDES: Triglycerides: 210 mg/dL — ABNORMAL HIGH (ref <150)

## 2019-05-18 LAB — LACTATE DEHYDROGENASE: LDH: 685 U/L — ABNORMAL HIGH (ref 98–192)

## 2019-05-18 LAB — FERRITIN: Ferritin: 1162 ng/mL — ABNORMAL HIGH (ref 24–336)

## 2019-05-18 MED ORDER — FUROSEMIDE 10 MG/ML IJ SOLN
40.0000 mg | Freq: Four times a day (QID) | INTRAMUSCULAR | Status: AC
  Administered 2019-05-18 (×3): 40 mg via INTRAVENOUS
  Filled 2019-05-18 (×3): qty 4

## 2019-05-18 MED ORDER — METOLAZONE 5 MG PO TABS
10.0000 mg | ORAL_TABLET | Freq: Once | ORAL | Status: AC
  Administered 2019-05-18: 10 mg via ORAL
  Filled 2019-05-18: qty 2

## 2019-05-18 MED ORDER — SODIUM CHLORIDE 0.9 % IV SOLN
INTRAVENOUS | Status: DC
  Administered 2019-05-19: 01:00:00 via INTRAVENOUS

## 2019-05-18 MED ORDER — CHLORHEXIDINE GLUCONATE CLOTH 2 % EX PADS: 6.0000 | MEDICATED_PAD | Freq: Every day | CUTANEOUS | Status: DC

## 2019-05-18 MED ORDER — MIDAZOLAM 50MG/50ML (1MG/ML) PREMIX INFUSION
0.0000 mg/h | INTRAVENOUS | Status: DC
  Administered 2019-05-18: 0.5 mg/h via INTRAVENOUS
  Administered 2019-05-18: 5 mg/h via INTRAVENOUS
  Administered 2019-05-18: 2 mg/h via INTRAVENOUS
  Administered 2019-05-19: 4 mg/h via INTRAVENOUS
  Filled 2019-05-18 (×4): qty 50

## 2019-05-18 MED ORDER — INSULIN ASPART 100 UNIT/ML ~~LOC~~ SOLN
3.0000 [IU] | SUBCUTANEOUS | Status: DC
  Administered 2019-05-18: 3 [IU] via SUBCUTANEOUS

## 2019-05-18 MED ORDER — METHYLPREDNISOLONE SODIUM SUCC 125 MG IJ SOLR
60.0000 mg | Freq: Four times a day (QID) | INTRAMUSCULAR | Status: DC
  Administered 2019-05-18 – 2019-05-19 (×5): 60 mg via INTRAVENOUS
  Filled 2019-05-18 (×5): qty 2

## 2019-05-18 MED ORDER — NOREPINEPHRINE 16 MG/250ML-% IV SOLN
0.0000 ug/min | INTRAVENOUS | Status: DC
  Administered 2019-05-18: 35 ug/min via INTRAVENOUS
  Administered 2019-05-19: 40 ug/min via INTRAVENOUS
  Administered 2019-05-19: 20 ug/min via INTRAVENOUS
  Filled 2019-05-18 (×2): qty 250

## 2019-05-18 MED ORDER — GUAIFENESIN-DM 100-10 MG/5ML PO SYRP
15.0000 mL | ORAL_SOLUTION | Freq: Four times a day (QID) | ORAL | Status: DC
  Administered 2019-05-18 – 2019-05-28 (×39): 15 mL via ORAL
  Filled 2019-05-18 (×40): qty 15

## 2019-05-18 MED ORDER — POTASSIUM CHLORIDE 20 MEQ/15ML (10%) PO SOLN
40.0000 meq | Freq: Three times a day (TID) | ORAL | Status: AC
  Administered 2019-05-18 (×2): 40 meq
  Filled 2019-05-18 (×2): qty 30

## 2019-05-18 MED ORDER — INSULIN DETEMIR 100 UNIT/ML ~~LOC~~ SOLN
10.0000 [IU] | Freq: Two times a day (BID) | SUBCUTANEOUS | Status: DC
  Administered 2019-05-18: 10 [IU] via SUBCUTANEOUS
  Filled 2019-05-18 (×2): qty 0.1

## 2019-05-18 MED ORDER — MIDAZOLAM HCL 2 MG/2ML IJ SOLN
4.0000 mg | Freq: Once | INTRAMUSCULAR | Status: AC
  Administered 2019-05-18: 4 mg via INTRAVENOUS
  Filled 2019-05-18: qty 4

## 2019-05-18 MED ORDER — SODIUM CHLORIDE 0.9% FLUSH: 10.0000 mL | INTRAVENOUS | Status: DC | PRN

## 2019-05-18 MED ORDER — DEXTROSE 10 % IV SOLN: INTRAVENOUS | Status: DC | PRN

## 2019-05-18 MED ORDER — INSULIN ASPART 100 UNIT/ML ~~LOC~~ SOLN
3.0000 [IU] | SUBCUTANEOUS | Status: DC
  Administered 2019-05-18: 9 [IU] via SUBCUTANEOUS

## 2019-05-18 MED ORDER — SODIUM CHLORIDE 0.9 % IV SOLN: INTRAVENOUS | Status: AC

## 2019-05-18 MED ORDER — SODIUM CHLORIDE 0.9% FLUSH
10.0000 mL | Freq: Two times a day (BID) | INTRAVENOUS | Status: DC
  Administered 2019-05-18: 10 mL
  Administered 2019-05-19: 40 mL
  Administered 2019-05-19: 30 mL
  Administered 2019-05-20 – 2019-05-21 (×3): 10 mL
  Administered 2019-05-21: 30 mL
  Administered 2019-05-22 – 2019-05-28 (×12): 10 mL
  Administered 2019-05-28: 30 mL
  Administered 2019-05-29: 10 mL

## 2019-05-18 MED ORDER — MORPHINE SULFATE (PF) 2 MG/ML IV SOLN
2.0000 mg | Freq: Once | INTRAVENOUS | Status: AC
  Administered 2019-05-18: 2 mg via INTRAVENOUS

## 2019-05-18 MED ORDER — INSULIN REGULAR(HUMAN) IN NACL 100-0.9 UT/100ML-% IV SOLN
INTRAVENOUS | Status: DC
  Administered 2019-05-18: 4.1 [IU]/h via INTRAVENOUS
  Administered 2019-05-19: 9.5 [IU]/h via INTRAVENOUS
  Administered 2019-05-19: 3.6 [IU]/h via INTRAVENOUS
  Administered 2019-05-20: 2.2 [IU]/h via INTRAVENOUS
  Filled 2019-05-18 (×8): qty 100

## 2019-05-18 MED ORDER — MORPHINE BOLUS VIA INFUSION
1.0000 mg | INTRAVENOUS | Status: DC | PRN
  Administered 2019-05-18 (×6): 1 mg via INTRAVENOUS
  Filled 2019-05-18: qty 1

## 2019-05-18 MED ORDER — MORPHINE 100MG IN NS 100ML (1MG/ML) PREMIX INFUSION
1.0000 mg/h | INTRAVENOUS | Status: DC
  Administered 2019-05-18: 22:00:00 5 mg/h via INTRAVENOUS
  Administered 2019-05-18: 1 mg/h via INTRAVENOUS
  Filled 2019-05-18 (×2): qty 100

## 2019-05-18 MED ORDER — FREE WATER
250.0000 mL | Status: DC
  Administered 2019-05-18 – 2019-05-25 (×41): 250 mL

## 2019-05-18 MED ORDER — FREE WATER
250.0000 mL | Freq: Four times a day (QID) | Status: DC
  Administered 2019-05-18: 250 mL

## 2019-05-18 MED ORDER — POLYETHYLENE GLYCOL 3350 17 G PO PACK
17.0000 g | PACK | Freq: Every day | ORAL | Status: DC | PRN
  Administered 2019-05-18 – 2019-05-27 (×6): 17 g via ORAL
  Filled 2019-05-18 (×6): qty 1

## 2019-05-18 MED ORDER — HEPARIN (PORCINE) 25000 UT/250ML-% IV SOLN
1250.0000 [IU]/h | INTRAVENOUS | Status: DC
  Administered 2019-05-18 – 2019-05-19 (×3): 1400 [IU]/h via INTRAVENOUS
  Administered 2019-05-20: 1250 [IU]/h via INTRAVENOUS
  Filled 2019-05-18 (×3): qty 250

## 2019-05-18 MED ORDER — DEXTROSE-NACL 5-0.45 % IV SOLN
INTRAVENOUS | Status: DC
  Administered 2019-05-19: 04:00:00 via INTRAVENOUS

## 2019-05-18 NOTE — Progress Notes (Signed)
[place] for heparin Indication: R/o PE in setting of COVID No Known Allergies  Patient Measurements: Height: '5\' 5"'  (165.1 cm) Weight: 232 lb 9.4 oz (105.5 kg) IBW/kg (Calculated) : 61.5 Heparin Dosing Weight: 84 kg   Vital Signs: Temp: 99.9 F (37.7 C) (05/30 0814) Temp Source: Axillary (05/30 0814) BP: 102/65 (05/30 0800) Pulse Rate: 110 (05/30 0800)  Labs: Recent Labs    05/16/19 0455  05/17/19 0125  05/17/19 1505 05/18/19 0100 05/18/19 0544 05/18/19 0900  HGB 14.3   < > 14.8   < > 12.2* 12.3* 11.2*  --   HCT 41.7   < > 44.8   < > 36.0* 37.8* 33.0*  --   PLT 312  --  316  --   --  230  --   --   HEPARINUNFRC  --   --   --   --   --  0.24*  --  0.48  CREATININE 0.65  --  0.79  --   --  1.04  --   --   CKTOTAL 64  --  115  --   --  88  --   --    < > = values in this interval not displayed.    Estimated Creatinine Clearance: 81.3 mL/min (by C-G formula based on SCr of 1.04 mg/dL).   Medical History: Past Medical History:  Diagnosis Date  . Cataract   . Diabetes mellitus without complication ([place])   . Hyperlipidemia   . Hypertension   . Pleural plaque 10/05/2015   calcified pleural plaque bilaterally on CT chest    Medications:  Medications Prior to Admission  Medication Sig Dispense Refill Last Dose  . [EXPIRED] benzonatate (TESSALON) 200 MG capsule Take 1 capsule (200 mg total) by mouth 3 (three) times daily as needed for up to 7 days for cough. 28 capsule 0 Past Week at Unknown time  . metFORMIN (GLUCOPHAGE) 1000 MG tablet Take 1 tablet (1,000 mg total) by mouth 2 (two) times daily with a meal. 180 tablet 1 unk  . ondansetron (ZOFRAN) 4 MG tablet Take 1 tablet (4 mg total) by mouth every 8 (eight) hours as needed for nausea or vomiting. 12 tablet 0 Past Week at Unknown time  . ACCU-CHEK AVIVA PLUS test strip Check sugar once daily; spanish label. 100 each 7 Taking  . ACCU-CHEK SOFTCLIX LANCETS lancets Check sugar once  daily 100 each 3 Taking  . atorvastatin (LIPITOR) 20 MG tablet Take 1 tablet (20 mg total) by mouth daily. 90 tablet 1   . blood glucose meter kit and supplies Dispense based on patient and insurance preference. Use up to four times daily as directed. (FOR ICD-9 250.00, 250.01). 1 each 0 Taking  . Blood Glucose Monitoring Suppl (ACCU-CHEK AVIVA PLUS) w/Device KIT    Taking  . gabapentin (NEURONTIN) 300 MG capsule Take 1 capsule (300 mg total) by mouth 3 (three) times daily. 90 capsule 1   . glipiZIDE (GLUCOTROL XL) 10 MG 24 hr tablet Take 1 tablet (10 mg total) by mouth daily with breakfast. 90 tablet 1     Assessment: 68 YOM who presented with SOB and fevers found to be COVID +. Was started on IV antibiotics for Sepsis. Now with worsening hypoxia and bump in D-dimer to 12.99. Pharmacy consulted to add IV heparin to r/o VTE treatment  IV heparin is currently running at 1400 units/hr. HL is now therapeutic at 0.48. H/H downtrending. Plt wnl. Of  note, D-dimer up to >20 today.    Goal of Therapy:  Heparin level 0.3-0.7 units/ml Monitor platelets by anticoagulation protocol: Yes   Plan:  -Continue heparin at 1400 units/hr  -F/u confirmatory HL later today  -Monitor daily HL, CBC and s/s of bleeding    Albertina Parr, PharmD., BCPS Clinical Pharmacist Clinical phone for 05/18/19 until 5pm: [phone]

## 2019-05-18 NOTE — Progress Notes (Signed)
[name] for heparin Indication: R/o PE in setting of COVID No Known Allergies  Patient Measurements: Height: '5\' 5"'  (165.1 cm) Weight: 232 lb 9.4 oz (105.5 kg) IBW/kg (Calculated) : 61.5 Heparin Dosing Weight: 84 kg   Vital Signs: Temp: 98.2 F (36.8 C) (05/30 0400) Temp Source: Oral (05/30 0400) BP: 107/63 (05/30 0415) Pulse Rate: 96 (05/30 0415)  Labs: Recent Labs    05/15/19 0450  05/16/19 0455  05/17/19 0125 05/17/19 1046 05/17/19 1505 05/18/19 0100  HGB  --    < > 14.3   < > 14.8 14.3 12.2* 12.3*  HCT  --    < > 41.7   < > 44.8 42.0 36.0* 37.8*  PLT  --    < > 312  --  316  --   --  230  HEPARINUNFRC  --   --   --   --   --   --   --  0.24*  CREATININE  --    < > 0.65  --  0.79  --   --  1.04  CKTOTAL  --    < > 64  --  115  --   --  88  TROPONINI <0.03  --   --   --   --   --   --   --    < > = values in this interval not displayed.    Estimated Creatinine Clearance: 81.3 mL/min (by C-G formula based on SCr of 1.04 mg/dL).   Medical History: Past Medical History:  Diagnosis Date  . Cataract   . Diabetes mellitus without complication ([place])   . Hyperlipidemia   . Hypertension   . Pleural plaque 10/05/2015   calcified pleural plaque bilaterally on CT chest    Medications:  Medications Prior to Admission  Medication Sig Dispense Refill Last Dose  . [EXPIRED] benzonatate (TESSALON) 200 MG capsule Take 1 capsule (200 mg total) by mouth 3 (three) times daily as needed for up to 7 days for cough. 28 capsule 0 Past Week at Unknown time  . metFORMIN (GLUCOPHAGE) 1000 MG tablet Take 1 tablet (1,000 mg total) by mouth 2 (two) times daily with a meal. 180 tablet 1 unk  . ondansetron (ZOFRAN) 4 MG tablet Take 1 tablet (4 mg total) by mouth every 8 (eight) hours as needed for nausea or vomiting. 12 tablet 0 Past Week at Unknown time  . ACCU-CHEK AVIVA PLUS test strip Check sugar once daily; spanish label. 100 each 7 Taking  .  ACCU-CHEK SOFTCLIX LANCETS lancets Check sugar once daily 100 each 3 Taking  . atorvastatin (LIPITOR) 20 MG tablet Take 1 tablet (20 mg total) by mouth daily. 90 tablet 1   . blood glucose meter kit and supplies Dispense based on patient and insurance preference. Use up to four times daily as directed. (FOR ICD-9 250.00, 250.01). 1 each 0 Taking  . Blood Glucose Monitoring Suppl (ACCU-CHEK AVIVA PLUS) w/Device KIT    Taking  . gabapentin (NEURONTIN) 300 MG capsule Take 1 capsule (300 mg total) by mouth 3 (three) times daily. 90 capsule 1   . glipiZIDE (GLUCOTROL XL) 10 MG 24 hr tablet Take 1 tablet (10 mg total) by mouth daily with breakfast. 90 tablet 1     Assessment: 80 YOM who presented with SOB and fevers found to be COVID +. Was started on IV antibiotics for Sepsis. Now with worsening hypoxia and bump in D-dimer to 12.99. Pharmacy  consulted to add IV heparin to r/o VTE treatment   Today, 05/18/19  HL is 0.24, subtherapeutic   Hgb WNL  No line or bleeding issues per RN     Goal of Therapy:  Heparin level 0.3-0.7 units/ml Monitor platelets by anticoagulation protocol: Yes   Plan:  -Increase heparin to 1400 units/hr  -F/u 6 hr HL -Monitor daily HL, CBC and s/s of bleeding    Royetta Asal, PharmD, BCPS 05/18/2019 4:29 AM

## 2019-05-18 NOTE — Progress Notes (Signed)
PROGRESS NOTE  Rei Medlen [place] WUJ:[id number] DOB: 1956-08-28 DOA: 05/12/2019 PCP: Patient, No Pcp Per   LOS: 4 days   Brief Narrative / Interim history: 63 year old male with history of diabetes, hyperlipidemia, was admitted to the hospital 05/01/2019 with shortness of breath, found to have hypoxic respiratory failure due to COVID-19 pneumonia.  He was admitted to the ICU requiring nonrebreather mask.  He received a dose of Actemra on 5/27 and was started on a course of Remdesivir.  He under went a CT angiogram on 5/27 with diffuse bilateral patchy airspace disease with groundglass appearance, no PE  Subjective: -Intubated yesterday afternoon due to ongoing respiratory distress complicated by encephalopathy.  Sedated on the vent this morning  Assessment & Plan: Principal Problem:   Acute respiratory failure with hypoxia (HCC) Active Problems:   Type 2 diabetes mellitus without complication, without long-term current use of insulin (HCC)   Pure hypercholesterolemia   COVID-19 virus infection   CAP (community acquired pneumonia)   Principal Problem Acute Hypoxic Respiratory Failure due to Covid-19 Viral Illness -Intubated on 05/17/2019, vent support and sedation per critical care  Vent Mode: PCV FiO2 (%):  [60 %-100 %] 60 % Set Rate:  [14 bmp-22 bmp] 14 bmp Vt Set:  [440 mL] 440 mL PEEP:  [10 cmH20-16 cmH20] 10 cmH20 Plateau Pressure:  [29 cmH20] 29 cmH20 -COVID-19 specific treatment  Remdesivir started 5/27  Actemra on 5/27  Convalescent plasma 5/29 -Continue steroids, diuresis  He is on Remdesivir, steroids, received Actemra, he is a candidate for convalescent plasma, discussed with family, patient currently starts to be a little bit more confused and will try to consent wife who is next decision maker -Continue broad-spectrum antibiotics  The treatment plan and use of medications and known side effects were discussed with patient/family, they were clearly explained that  there is no proven definitive treatment for COVID-19 infection, any medications used here are based on published clinical articles/anecdotal data which are not peer-reviewed or randomized control trials.  Complete risks and long-term side effects are unknown, however in the best clinical judgment they seem to be of some clinical benefit rather than medical risks.  Patient agree with the treatment plan and want to receive the given medications.  COVID-19 Labs  Recent Labs    05/16/19 0455 05/17/19 0125 05/18/19 0100  DDIMER  --  12.99* >20.00*  FERRITIN 3,595* 2,413* 1,162*  LDH  --  762* 685*  CRP 29.3* 15.0* 7.5*   D-dimer continues to increase, patient is on full dose heparin since 5/29  Lab Results  Component Value Date   SARSCOV2NAA DETECTED (A) 05/08/2019    Type 2 diabetes mellitus -CBG control per ICU protocol  Elevated LFTs -Possibly related to Covid versus Actemra, peaked and now trending down.  We will continue to monitor.   Scheduled Meds: . chlorhexidine gluconate (MEDLINE KIT)  15 mL Mouth Rinse BID  . Chlorhexidine Gluconate Cloth  6 each Topical q morning - 10a  . free water  250 mL Per Tube Q4H  . furosemide  40 mg Intravenous Q6H  . guaiFENesin-dextromethorphan  15 mL Oral Q6H  . mouth rinse  15 mL Mouth Rinse 10 times per day  . methylPREDNISolone (SOLU-MEDROL) injection  60 mg Intravenous Q6H  . multivitamin with minerals  1 tablet Per Tube Daily  . potassium chloride  40 mEq Per Tube TID   Continuous Infusions: . sodium chloride Stopped (05/18/19 0933)  . azithromycin Stopped (05/17/19 2332)  . ceFEPime (MAXIPIME) IV  Stopped (05/18/19 7353)  . famotidine (PEPCID) IV 100 mL/hr at 05/18/19 1000  . feeding supplement (VITAL HIGH PROTEIN) 1,000 mL (05/18/19 1008)  . fentaNYL infusion INTRAVENOUS Stopped (05/18/19 0300)  . heparin 1,400 Units/hr (05/18/19 1000)  . insulin 1.7 Units/hr (05/18/19 1024)  . midazolam 2 mg/hr (05/18/19 1000)  . morphine 2  mg/hr (05/18/19 1000)  . norepinephrine (LEVOPHED) Adult infusion Stopped (05/18/19 0834)  . remdesivir 100 mg in NS 250 mL 100 mg (05/17/19 1201)   PRN Meds:.sodium chloride, acetaminophen, metoprolol tartrate, morphine injection, morphine, ondansetron (ZOFRAN) IV, polyethylene glycol, polyvinyl alcohol  DVT prophylaxis: Lovenox Code Status: Full code Family Communication: Discussed with granddaughter over the phone on 5/29 Disposition Plan: TBD  Consultants:   Critical care  Procedures:   None   Antimicrobials:  Vancomycin 5/26 >> 5/28  Cefepime 5/26 >>  Azithromycin 5/26 >>   Objective: Vitals:   05/18/19 0745 05/18/19 0800 05/18/19 0814 05/18/19 0915  BP: 120/66 102/65    Pulse: (!) 115 (!) 110    Resp: 19 18    Temp:   99.9 F (37.7 C)   TempSrc:   Axillary   SpO2: 94% 94%  90%  Weight:      Height:        Intake/Output Summary (Last 24 hours) at 05/18/2019 1039 Last data filed at 05/18/2019 1000 Gross per 24 hour  Intake 3305.25 ml  Output 1735 ml  Net 1570.25 ml   Filed Weights   05/11/2019 2354  Weight: 105.5 kg    Examination:  Constitutional: Intubated, sedated on the vent Eyes: No icterus ENMT: Mucous membranes are dry.  Respiratory: Distant breath sounds, no wheezing, faint bibasilar crackles Cardiovascular: Regular rate and rhythm, no murmurs, no edema Abdomen: Soft, bowel sounds positive Musculoskeletal: no clubbing / cyanosis.  Skin: No rashes seen Neurologic: Sedated   Data Reviewed: I have independently reviewed following labs and imaging studies   CBC: Recent Labs  Lab 05/01/2019 1815 05/15/19 0455  05/16/19 0455  05/17/19 0125 05/17/19 1046 05/17/19 1505 05/18/19 0100 05/18/19 0544  WBC 7.5 7.3  --  8.7  --  9.4  --   --  13.6*  --   NEUTROABS 6.0 5.4  --  6.9  --  8.3*  --   --  12.2*  --   HGB 13.2 13.2   < > 14.3   < > 14.8 14.3 12.2* 12.3* 11.2*  HCT 37.9* 39.6   < > 41.7   < > 44.8 42.0 36.0* 37.8* 33.0*  MCV 91.1  91.7  --  92.1  --  91.8  --   --  95.0  --   PLT 210 245  --  312  --  316  --   --  230  --    < > = values in this interval not displayed.   Basic Metabolic Panel: Recent Labs  Lab 04/25/2019 1815 05/15/19 0455  05/16/19 0455  05/17/19 0125 05/17/19 1046 05/17/19 1505 05/18/19 0100 05/18/19 0544  NA 133* 136   < > 139   < > 141 144 145 150* 152*  K 3.8 4.0   < > 3.8   < > 3.8 3.4* 4.0 4.2 4.0  CL 102 102  --  105  --  103  --   --  116*  --   CO2 23 25  --  24  --  24  --   --  24  --   GLUCOSE 179*  146*  --  169*  --  252*  --   --  164*  --   BUN 12 10  --  10  --  19  --   --  37*  --   CREATININE 0.75 0.68  --  0.65  --  0.79  --   --  1.04  --   CALCIUM 7.8* 7.8*  --  7.8*  --  8.1*  --   --  7.9*  --   MG  --  2.2  --  2.2  --  2.5*  --   --  2.8*  --   PHOS  --  2.4*  --  3.1  --  2.3*  --   --  3.1  --    < > = values in this interval not displayed.   GFR: Estimated Creatinine Clearance: 81.3 mL/min (by C-G formula based on SCr of 1.04 mg/dL). Liver Function Tests: Recent Labs  Lab 05/06/2019 1815 05/15/19 0455 05/16/19 0455 05/17/19 0125 05/18/19 0100  AST 114* 106* 235* 118* 65*  ALT 88* 79* 165* 136* 88*  ALKPHOS 96 100 160* 183* 186*  BILITOT 0.5 0.5 0.5 0.4 0.1*  PROT 7.4 7.3 7.0 7.1 6.5  ALBUMIN 2.9* 2.7* 2.4* 2.6* 2.5*   No results for input(s): LIPASE, AMYLASE in the last 168 hours. No results for input(s): AMMONIA in the last 168 hours. Coagulation Profile: No results for input(s): INR, PROTIME in the last 168 hours. Cardiac Enzymes: Recent Labs  Lab 05/19/2019 1815 04/22/2019 2018 05/15/19 0450 05/15/19 0455 05/16/19 0455 05/17/19 0125 05/18/19 0100  CKTOTAL  --  62  --  74 64 115 88  CKMB  --  1.7  --   --   --   --   --   TROPONINI <0.03  --  <0.03  --   --   --   --    BNP (last 3 results) No results for input(s): PROBNP in the last 8760 hours. HbA1C: No results for input(s): HGBA1C in the last 72 hours. CBG: Recent Labs  Lab  05/18/19 0321 05/18/19 0527 05/18/19 0622 05/18/19 0729 05/18/19 0852  GLUCAP 136* 181* 156* 205* 164*   Lipid Profile: Recent Labs    05/18/19 0100  TRIG 210*   Thyroid Function Tests: No results for input(s): TSH, T4TOTAL, FREET4, T3FREE, THYROIDAB in the last 72 hours. Anemia Panel: Recent Labs    05/17/19 0125 05/18/19 0100  FERRITIN 2,413* 1,162*   Urine analysis:    Component Value Date/Time   COLORURINE YELLOW 05/28/2016 2056   APPEARANCEUR CLEAR 05/28/2016 2056   LABSPEC 1.041 (H) 05/28/2016 2056   PHURINE 5.0 05/28/2016 2056   GLUCOSEU >1000 (A) 05/28/2016 2056   HGBUR NEGATIVE 05/28/2016 2056   BILIRUBINUR negative 06/19/2018 1601   KETONESUR negative 06/19/2018 1601   KETONESUR 15 (A) 05/28/2016 2056   PROTEINUR negative 06/19/2018 1601   PROTEINUR NEGATIVE 05/28/2016 2056   UROBILINOGEN 0.2 06/19/2018 1601   NITRITE Negative 06/19/2018 1601   NITRITE NEGATIVE 05/28/2016 2056   LEUKOCYTESUR Negative 06/19/2018 1601   Sepsis Labs: Invalid input(s): PROCALCITONIN, LACTICIDVEN  Recent Results (from the past 240 hour(s))  Novel Coronavirus,NAA,(SEND-OUT TO REF LAB - TAT 24-48 hrs); Hosp Order     Status: Abnormal   Collection Time: 05/08/19  4:43 PM  Result Value Ref Range Status   SARS-CoV-2, NAA DETECTED (A) NOT DETECTED Final    Comment: CRITICAL RESULT CALLED TO, READ BACK BY AND VERIFIED  WITH: Buddy Duty, NCED RN, AT 1625 ON 05/09/19 BY C. JESSUP, MLT. (NOTE) This test was developed and its performance characteristics determined by [employer]. This test has not been FDA cleared or approved. This test has been authorized by FDA under an Emergency Use Authorization (EUA). This test is only authorized for the duration of time the declaration that circumstances exist justifying the authorization of the emergency use of in vitro diagnostic tests for detection of SARS-CoV-2 virus and/or diagnosis of COVID-19 infection under section  564(b)(1) of the Act, 21 U.S.C. 563OVF-6(E)(3), unless the authorization is terminated or revoked sooner. When diagnostic testing is negative, the possibility of a false negative result should be considered in the context of a patient's recent exposures and the presence of clinical signs and symptoms consistent with COVID-19. An individual without symptoms of COVID-19  and who is not shedding SARS-CoV-2 virus would expect to have a negative (not detected) result in this assay. Performed At: [facility] [address] [place], [place] [id number] Rush Farmer MD YS:[id number]    [place]  Final    Comment: Performed at [facility], [place] [address], [place], [place] [address]  Culture, blood (Routine X 2) w Reflex to ID Panel     Status: None (Preliminary result)   Collection Time: 05/01/2019  8:18 PM  Result Value Ref Range Status   Specimen Description   Final    BLOOD BLOOD LEFT FOREARM Performed at [facility] [address] [place], [place] [address]    Special Requests   Final    BOTTLES DRAWN AEROBIC AND ANAEROBIC Blood Culture adequate volume Performed at [facility] [address], [place], [place] [address]    Culture   Final    NO GROWTH 3 DAYS Performed at [facility], [place] [address] [place], [place] [address]    Report Status PENDING  Incomplete  MRSA PCR Screening     Status: None   Collection Time: 05/15/19  1:55 PM  Result Value Ref Range Status   MRSA by PCR NEGATIVE NEGATIVE Final    Comment:        The GeneXpert MRSA Assay (FDA approved for NASAL specimens only), is one component of a comprehensive MRSA colonization surveillance program. It is not intended to diagnose MRSA infection nor to guide or monitor treatment for MRSA infections. Performed at [facility], [place] [address], [place], [place] [address]       Radiology Studies: Dg Abd 1  View  Result Date: 05/17/2019 CLINICAL DATA:  Encounter for feeding tube placement. EXAM: ABDOMEN - 1 VIEW COMPARISON:  None. FINDINGS: The bowel gas pattern is normal. Distal tip of nasogastric tube is seen in proximal stomach. Side hole is in expected position of gastroesophageal junction. No radio-opaque calculi or other significant radiographic abnormality are seen. IMPRESSION: Distal tip of nasogastric tube seen in proximal stomach. Electronically Signed   By: Marijo Conception M.D.   On: 05/17/2019 13:49   Dg Chest Port 1 View  Result Date: 05/18/2019 CLINICAL DATA:  COVID-19 viral infection, ETT EXAM: PORTABLE CHEST 1 VIEW COMPARISON:  05/17/2019 FINDINGS: Endotracheal tube terminates 4 cm above the carina. Multifocal patchy/airspace opacities bilaterally. No definite pleural effusions. Cardiomegaly. Left subclavian venous catheter terminates cavoatrial junction. Enteric tube courses into the stomach. IMPRESSION: Multifocal patchy/airspace opacities bilaterally, compatible with known pneumonia, grossly unchanged. Endotracheal tube terminates 4 cm above the carina. Additional support apparatus as above. Electronically  Signed   By: Julian Hy M.D.   On: 05/18/2019 06:55   Dg Chest Port 1 View  Result Date: 05/17/2019 CLINICAL DATA:  Encounter for intubation, feeding tube placement and central line placement. EXAM: PORTABLE CHEST 1 VIEW COMPARISON:  Radiograph of same day. FINDINGS: The heart size and mediastinal contours are within normal limits. Endotracheal tube is in grossly good position. Distal tip of nasogastric tube is seen in proximal stomach. Left subclavian catheter is noted with tip in expected position of the SVC. Stable bilateral lung opacities are noted consistent with multifocal pneumonia or edema. Calcified pleural plaques are noted bilaterally suggesting asbestos exposure. The visualized skeletal structures are unremarkable. IMPRESSION: Endotracheal tube is in grossly good  position. Distal tip of nasogastric tube is seen in proximal stomach. Left subclavian catheter is in good position. Stable bilateral lung opacities are noted concerning for multifocal pneumonia or possibly edema. Electronically Signed   By: Marijo Conception M.D.   On: 05/17/2019 13:48   Dg Chest Port 1 View  Result Date: 05/17/2019 CLINICAL DATA:  Respiratory failure. EXAM: PORTABLE CHEST 1 VIEW COMPARISON:  CT 05/15/2019.  Chest x-ray 04/22/2019. FINDINGS: Stable cardiomegaly. Diffuse severe bilateral pulmonary infiltrates/edema again noted. Similar findings noted on prior CT. No pleural effusion or pneumothorax. No acute bony abnormality. IMPRESSION: 1. Diffuse severe bilateral pulmonary infiltrates/edema again noted. Similar findings noted on prior CT. No interim improvement. 2.  Stable cardiomegaly. Electronically Signed   By: Marcello Moores  Register   On: 05/17/2019 07:44    Marzetta Board, MD, PhD [facility]  Contact via  [email or web address]  [place] P: [phone]  F: [phone]

## 2019-05-18 NOTE — Progress Notes (Signed)
ANTICOAGULATION CONSULT NOTE  Pharmacy Consult for heparin Indication: R/o PE in setting of COVID No Known Allergies  Patient Measurements: Height: 5' 5" (165.1 cm) Weight: 232 lb 9.4 oz (105.5 kg) IBW/kg (Calculated) : 61.5 Heparin Dosing Weight: 84 kg   Vital Signs: Temp: 101.3 F (38.5 C) (05/30 2051) Temp Source: Axillary (05/30 2051) BP: 86/47 (05/30 2100) Pulse Rate: 104 (05/30 2100)  Labs: Recent Labs    05/16/19 0455  05/17/19 0125  05/18/19 0100 05/18/19 0544 05/18/19 0900 05/18/19 1044 05/18/19 1539 05/18/19 1601  HGB 14.3   < > 14.8   < > 12.3* 11.2*  --  12.6*  --  13.6  HCT 41.7   < > 44.8   < > 37.8* 33.0*  --  37.0*  --  40.0  PLT 312  --  316  --  230  --   --   --   --   --   HEPARINUNFRC  --   --   --   --  0.24*  --  0.48  --  0.47  --   CREATININE 0.65  --  0.79  --  1.04  --   --   --   --   --   CKTOTAL 64  --  115  --  88  --   --   --   --   --    < > = values in this interval not displayed.    Estimated Creatinine Clearance: 81.3 mL/min (by C-G formula based on SCr of 1.04 mg/dL).   Medical History: Past Medical History:  Diagnosis Date  . Cataract   . Diabetes mellitus without complication ([place])   . Hyperlipidemia   . Hypertension   . Pleural plaque 10/05/2015   calcified pleural plaque bilaterally on CT chest    Medications:  Medications Prior to Admission  Medication Sig Dispense Refill Last Dose  . [EXPIRED] benzonatate (TESSALON) 200 MG capsule Take 1 capsule (200 mg total) by mouth 3 (three) times daily as needed for up to 7 days for cough. 28 capsule 0 Past Week at Unknown time  . metFORMIN (GLUCOPHAGE) 1000 MG tablet Take 1 tablet (1,000 mg total) by mouth 2 (two) times daily with a meal. 180 tablet 1 unk  . ondansetron (ZOFRAN) 4 MG tablet Take 1 tablet (4 mg total) by mouth every 8 (eight) hours as needed for nausea or vomiting. 12 tablet 0 Past Week at Unknown time  . ACCU-CHEK AVIVA PLUS test strip Check sugar once daily;  spanish label. 100 each 7 Taking  . ACCU-CHEK SOFTCLIX LANCETS lancets Check sugar once daily 100 each 3 Taking  . atorvastatin (LIPITOR) 20 MG tablet Take 1 tablet (20 mg total) by mouth daily. 90 tablet 1   . blood glucose meter kit and supplies Dispense based on patient and insurance preference. Use up to four times daily as directed. (FOR ICD-9 250.00, 250.01). 1 each 0 Taking  . Blood Glucose Monitoring Suppl (ACCU-CHEK AVIVA PLUS) w/Device KIT    Taking  . gabapentin (NEURONTIN) 300 MG capsule Take 1 capsule (300 mg total) by mouth 3 (three) times daily. 90 capsule 1   . glipiZIDE (GLUCOTROL XL) 10 MG 24 hr tablet Take 1 tablet (10 mg total) by mouth daily with breakfast. 90 tablet 1     Assessment: 58 YOM who presented with SOB and fevers found to be COVID +. Was started on IV antibiotics for Sepsis. Now with worsening hypoxia and bump  in D-dimer to 12.99. Pharmacy consulted to add IV heparin to r/o VTE treatment  IV heparin is currently running at 1400 units/hr. HL is now therapeutic at 0.48. H/H downtrending. Plt wnl. Of note, D-dimer up to >20 today.   05/18/19 9:40 PM  - HL at goal  - CBC stable - no bleeding/line issues reported  Goal of Therapy:  Heparin level 0.3-0.7 units/ml Monitor platelets by anticoagulation protocol: Yes   Plan:  -Continue heparin at 1400 units/hr  -Monitor daily HL, CBC and s/s of bleeding   Ulice Dash, PharmD Clinical Pharmacist

## 2019-05-18 NOTE — Progress Notes (Signed)
Wasted Ketamine in Stericycle receptacle. Witnessed by Nolon Bussing., RN.

## 2019-05-18 NOTE — Progress Notes (Signed)
NAME:  Allene Pyontonio Garcia Cortes, MRN:  [id number], DOB:  [birth year], LOS: 4 ADMISSION DATE:  04/19/2019, CONSULTATION DATE:  05/15/2019 REFERRING MD:  Dr. Kerry HoughMemon, CHIEF COMPLAINT:  Dyspnea   Brief History   63 y/o male with DM2, HTN admitted on 5/26 with acute on chronic respiratory failure with hypoxemia due to COVID 19 pneumonia.  History of present illness   63 year old male with a past medical history significant for hypertension and hyperlipidemia was admitted to our facility on May 14, 2019 in the setting of worsening shortness of breath.  He said that he has developed increasing shortness of breath and cough with some mucus production for about a week.  He has smoked intermittently over the years but he says he does not smoke every day.  He says that he was diagnosed with coronavirus approximately 5 days prior to admission.  After admission he was noted to be hypoxemic.  He was treated with a course of Actemra and started on Remdesivir.  Because of increasing oxygen requirements pulmonary and critical care medicine was consulted for close monitoring.  He has been moved to the intensive care unit.  Currently he is breathing with a 100% nonrebreather mask.  He says he feels like his breathing may be getting better.  Past Medical History  Hypertension Hyperlipidemia DM2  Significant Hospital Events   5/26 admission  Consults:  Pulmonary   Procedures:    Significant Diagnostic Tests:  CT angiogram chest 5/27 > diffuse bilateral patchy airspace disease with groundglass upper lobes predominant, no pulmonary embolism  Micro Data:  5/21 SARS-COV-2 > positive  Antimicrobials:  5/26 blood culture>   Interim history/subjective:  Intubated yesterday afternoon, oxygenation improved  Objective   Blood pressure 102/65, pulse (!) 110, temperature 99.9 F (37.7 C), temperature source Axillary, resp. rate 18, height 5\' 5"  (1.651 m), weight 105.5 kg, SpO2 94 %.    Vent Mode: PRVC FiO2 (%):   [60 %-100 %] 60 % Set Rate:  [14 bmp-22 bmp] 22 bmp Vt Set:  [440 mL] 440 mL PEEP:  [16 cmH20] 16 cmH20 Plateau Pressure:  [29 cmH20] 29 cmH20   Intake/Output Summary (Last 24 hours) at 05/18/2019 0842 Last data filed at 05/18/2019 0700 Gross per 24 hour  Intake 3168.94 ml  Output 1335 ml  Net 1833.94 ml   Filed Weights   05/03/2019 2354  Weight: 105.5 kg   Examination:  General:  Acutely ill appearing male, NAD HENT: [place]/AT, PERRL, EOM-I and MMM PULM: Coarse BS diffusely CV: RRR, Nl S1/S2 and -M/R/G GI: Soft, NT, ND and +BS MSK: WNL Neuro: Sedate but arousable  I reviewed CXR myself, ETT is in a good position with infiltrate noted  Resolved Hospital Problem list     Assessment & Plan:  Acute coronavirus pneumonia with acute respiratory failure with hypoxemia Decrease PEEP to 19 Decrease FiO2 to 60% Maintain on full vent support Continue using Remdesivir per protocol til complete Actemra completed Add solumedrol 60 q6 Plasma given  Fluid overload: Lasix 40 mg IV q6 x3 doses K-dur 40 x2 doses Zaroxolyn 10 mg PO x1 BMET in AM Replace electrolytes as indicated  Malnutrition: Consult nutrition for TF  Mediastinal lymphadenopathy: likely reactive > will need f/u CT in 3 months  Best practice:  Per [facility]  Labs   CBC: Recent Labs  Lab 05/13/2019 1815 05/15/19 0455  05/16/19 0455  05/17/19 0125 05/17/19 1046 05/17/19 1505 05/18/19 0100 05/18/19 0544  WBC 7.5 7.3  --  8.7  --  9.4  --   --  13.6*  --   NEUTROABS 6.0 5.4  --  6.9  --  8.3*  --   --  12.2*  --   HGB 13.2 13.2   < > 14.3   < > 14.8 14.3 12.2* 12.3* 11.2*  HCT 37.9* 39.6   < > 41.7   < > 44.8 42.0 36.0* 37.8* 33.0*  MCV 91.1 91.7  --  92.1  --  91.8  --   --  95.0  --   PLT 210 245  --  312  --  316  --   --  230  --    < > = values in this interval not displayed.    Basic Metabolic Panel: Recent Labs  Lab June 13, 2019 1815 05/15/19 0455  05/16/19 0455  05/17/19 0125 05/17/19 1046 05/17/19  1505 05/18/19 0100 05/18/19 0544  NA 133* 136   < > 139   < > 141 144 145 150* 152*  K 3.8 4.0   < > 3.8   < > 3.8 3.4* 4.0 4.2 4.0  CL 102 102  --  105  --  103  --   --  116*  --   CO2 23 25  --  24  --  24  --   --  24  --   GLUCOSE 179* 146*  --  169*  --  252*  --   --  164*  --   BUN 12 10  --  10  --  19  --   --  37*  --   CREATININE 0.75 0.68  --  0.65  --  0.79  --   --  1.04  --   CALCIUM 7.8* 7.8*  --  7.8*  --  8.1*  --   --  7.9*  --   MG  --  2.2  --  2.2  --  2.5*  --   --  2.8*  --   PHOS  --  2.4*  --  3.1  --  2.3*  --   --  3.1  --    < > = values in this interval not displayed.   GFR: Estimated Creatinine Clearance: 81.3 mL/min (by C-G formula based on SCr of 1.04 mg/dL). Recent Labs  Lab 06-13-2019 1815 2019/06/13 2015 05/15/19 0455 05/16/19 0455 05/17/19 0125 05/18/19 0100  PROCALCITON 0.92  --   --   --   --   --   WBC 7.5  --  7.3 8.7 9.4 13.6*  LATICACIDVEN  --  1.5  --   --   --   --     Liver Function Tests: Recent Labs  Lab 06/13/19 1815 05/15/19 0455 05/16/19 0455 05/17/19 0125 05/18/19 0100  AST 114* 106* 235* 118* 65*  ALT 88* 79* 165* 136* 88*  ALKPHOS 96 100 160* 183* 186*  BILITOT 0.5 0.5 0.5 0.4 0.1*  PROT 7.4 7.3 7.0 7.1 6.5  ALBUMIN 2.9* 2.7* 2.4* 2.6* 2.5*   No results for input(s): LIPASE, AMYLASE in the last 168 hours. No results for input(s): AMMONIA in the last 168 hours.  ABG    Component Value Date/Time   PHART 7.381 05/18/2019 0544   PCO2ART 40.5 05/18/2019 0544   PO2ART 185.0 (H) 05/18/2019 0544   HCO3 24.0 05/18/2019 0544   TCO2 25 05/18/2019 0544   ACIDBASEDEF 1.0 05/18/2019 0544   O2SAT 100.0 05/18/2019 0544     Coagulation  Profile: No results for input(s): INR, PROTIME in the last 168 hours.  Cardiac Enzymes: Recent Labs  Lab 05/23/19 1815 05/23/19 2018 05/15/19 0450 05/15/19 0455 05/16/19 0455 05/17/19 0125 05/18/19 0100  CKTOTAL  --  62  --  74 64 115 88  CKMB  --  1.7  --   --   --   --   --    TROPONINI <0.03  --  <0.03  --   --   --   --     HbA1C: Hemoglobin A1C  Date/Time Value Ref Range Status  06/19/2018 04:01 PM 9.1 (A) 4.0 - 5.6 % Final  01/02/2017 06:51 PM 12.8  Final    CBG: Recent Labs  Lab 05/18/19 0219 05/18/19 0321 05/18/19 0527 05/18/19 0622 05/18/19 0729  GLUCAP 121* 136* 181* 156* 205*   The patient is critically ill with multiple organ systems failure and requires high complexity decision making for assessment and support, frequent evaluation and titration of therapies, application of advanced monitoring technologies and extensive interpretation of multiple databases.   Critical Care Time devoted to patient care services described in this note is  32  Minutes. This time reflects time of care of this signee Dr Koren Bound. This critical care time does not reflect procedure time, or teaching time or supervisory time of PA/NP/Med student/Med Resident etc but could involve care discussion time.  Alyson Reedy, M.D. [facility] Pulmonary/Critical Care Medicine. Pager: [phone]. After hours pager: [phone].

## 2019-05-18 NOTE — Progress Notes (Signed)
UTA daily Weights, Bed Scale reads 1.6Kg.

## 2019-05-18 NOTE — Progress Notes (Signed)
Granddaughter Steward Drone updated via phone, eduction provided, all questions answered.

## 2019-05-19 ENCOUNTER — Inpatient Hospital Stay (HOSPITAL_COMMUNITY): Payer: BLUE CROSS/BLUE SHIELD

## 2019-05-19 ENCOUNTER — Inpatient Hospital Stay (HOSPITAL_COMMUNITY): Payer: Self-pay

## 2019-05-19 DIAGNOSIS — J1289 Other viral pneumonia: Secondary | ICD-10-CM

## 2019-05-19 LAB — PREPARE FRESH FROZEN PLASMA

## 2019-05-19 LAB — POCT I-STAT 7, (LYTES, BLD GAS, ICA,H+H)
Acid-Base Excess: 1 mmol/L (ref 0.0–2.0)
Acid-Base Excess: 3 mmol/L — ABNORMAL HIGH (ref 0.0–2.0)
Bicarbonate: 29.5 mmol/L — ABNORMAL HIGH (ref 20.0–28.0)
Bicarbonate: 28.4 mmol/L — ABNORMAL HIGH (ref 20.0–28.0)
Calcium, Ion: 1.17 mmol/L (ref 1.15–1.40)
Calcium, Ion: 1.11 mmol/L — ABNORMAL LOW (ref 1.15–1.40)
HCT: 41 % (ref 39.0–52.0)
HCT: 38 % — ABNORMAL LOW (ref 39.0–52.0)
Hemoglobin: 13.9 g/dL (ref 13.0–17.0)
Hemoglobin: 12.9 g/dL — ABNORMAL LOW (ref 13.0–17.0)
O2 Saturation: 91 %
O2 Saturation: 94 %
Patient temperature: 100.3
Patient temperature: 99.8
Potassium: 3.6 mmol/L (ref 3.5–5.1)
Potassium: 3.8 mmol/L (ref 3.5–5.1)
Sodium: 148 mmol/L — ABNORMAL HIGH (ref 135–145)
Sodium: 147 mmol/L — ABNORMAL HIGH (ref 135–145)
TCO2: 31 mmol/L (ref 22–32)
TCO2: 30 mmol/L (ref 22–32)
pCO2 arterial: 65.9 mmHg (ref 32.0–48.0)
pCO2 arterial: 45.3 mmHg (ref 32.0–48.0)
pH, Arterial: 7.263 — ABNORMAL LOW (ref 7.350–7.450)
pH, Arterial: 7.408 (ref 7.350–7.450)
pO2, Arterial: 76 mmHg — ABNORMAL LOW (ref 83.0–108.0)
pO2, Arterial: 71 mmHg — ABNORMAL LOW (ref 83.0–108.0)

## 2019-05-19 LAB — COMPREHENSIVE METABOLIC PANEL
ALT: 79 U/L — ABNORMAL HIGH (ref 0–44)
AST: 63 U/L — ABNORMAL HIGH (ref 15–41)
Albumin: 3.1 g/dL — ABNORMAL LOW (ref 3.5–5.0)
Alkaline Phosphatase: 227 U/L — ABNORMAL HIGH (ref 38–126)
Anion gap: 12 (ref 5–15)
BUN: 34 mg/dL — ABNORMAL HIGH (ref 8–23)
CO2: 25 mmol/L (ref 22–32)
Calcium: 8 mg/dL — ABNORMAL LOW (ref 8.9–10.3)
Chloride: 112 mmol/L — ABNORMAL HIGH (ref 98–111)
Creatinine, Ser: 0.9 mg/dL (ref 0.61–1.24)
GFR calc Af Amer: >60 mL/min (ref >60)
GFR calc non Af Amer: >60 mL/min (ref >60)
Glucose, Bld: 165 mg/dL — ABNORMAL HIGH (ref 70–99)
Potassium: 3.7 mmol/L (ref 3.5–5.1)
Sodium: 149 mmol/L — ABNORMAL HIGH (ref 135–145)
Total Bilirubin: 0.1 mg/dL — ABNORMAL LOW (ref 0.3–1.2)
Total Protein: 7.4 g/dL (ref 6.5–8.1)

## 2019-05-19 LAB — PROCALCITONIN: Procalcitonin: 0.65 ng/mL

## 2019-05-19 LAB — BPAM FFP
Blood Product Expiration Date: 202005302100
ISSUE DATE / TIME: 202005292148
Unit Type and Rh: 5100

## 2019-05-19 LAB — GLUCOSE, CAPILLARY
Glucose-Capillary: 126 mg/dL — ABNORMAL HIGH (ref 70–99)
Glucose-Capillary: 134 mg/dL — ABNORMAL HIGH (ref 70–99)
Glucose-Capillary: 142 mg/dL — ABNORMAL HIGH (ref 70–99)
Glucose-Capillary: 154 mg/dL — ABNORMAL HIGH (ref 70–99)
Glucose-Capillary: 154 mg/dL — ABNORMAL HIGH (ref 70–99)
Glucose-Capillary: 163 mg/dL — ABNORMAL HIGH (ref 70–99)
Glucose-Capillary: 168 mg/dL — ABNORMAL HIGH (ref 70–99)
Glucose-Capillary: 168 mg/dL — ABNORMAL HIGH (ref 70–99)
Glucose-Capillary: 169 mg/dL — ABNORMAL HIGH (ref 70–99)
Glucose-Capillary: 180 mg/dL — ABNORMAL HIGH (ref 70–99)
Glucose-Capillary: 185 mg/dL — ABNORMAL HIGH (ref 70–99)
Glucose-Capillary: 186 mg/dL — ABNORMAL HIGH (ref 70–99)
Glucose-Capillary: 195 mg/dL — ABNORMAL HIGH (ref 70–99)
Glucose-Capillary: 205 mg/dL — ABNORMAL HIGH (ref 70–99)
Glucose-Capillary: 205 mg/dL — ABNORMAL HIGH (ref 70–99)
Glucose-Capillary: 207 mg/dL — ABNORMAL HIGH (ref 70–99)
Glucose-Capillary: 211 mg/dL — ABNORMAL HIGH (ref 70–99)
Glucose-Capillary: 221 mg/dL — ABNORMAL HIGH (ref 70–99)
Glucose-Capillary: 246 mg/dL — ABNORMAL HIGH (ref 70–99)
Glucose-Capillary: 285 mg/dL — ABNORMAL HIGH (ref 70–99)
Glucose-Capillary: 338 mg/dL — ABNORMAL HIGH (ref 70–99)

## 2019-05-19 LAB — C-REACTIVE PROTEIN: CRP: 4.1 mg/dL — ABNORMAL HIGH (ref <1.0)

## 2019-05-19 LAB — CBC WITH DIFFERENTIAL/PLATELET
Abs Immature Granulocytes: 1.32 10*3/uL — ABNORMAL HIGH (ref 0.00–0.07)
Basophils Absolute: 0.2 10*3/uL — ABNORMAL HIGH (ref 0.0–0.1)
Basophils Relative: 1 %
Eosinophils Absolute: 0 10*3/uL (ref 0.0–0.5)
Eosinophils Relative: 0 %
HCT: 44 % (ref 39.0–52.0)
Hemoglobin: 14.5 g/dL (ref 13.0–17.0)
Immature Granulocytes: 7 %
Lymphocytes Relative: 10 %
Lymphs Abs: 1.9 10*3/uL (ref 0.7–4.0)
MCH: 31.4 pg (ref 26.0–34.0)
MCHC: 33 g/dL (ref 30.0–36.0)
MCV: 95.2 fL (ref 80.0–100.0)
Monocytes Absolute: 1 10*3/uL (ref 0.1–1.0)
Monocytes Relative: 5 %
Neutro Abs: 14.4 10*3/uL — ABNORMAL HIGH (ref 1.7–7.7)
Neutrophils Relative %: 77 %
Platelets: 361 10*3/uL (ref 150–400)
RBC: 4.62 MIL/uL (ref 4.22–5.81)
RDW: 14.3 % (ref 11.5–15.5)
WBC: 18.7 10*3/uL — ABNORMAL HIGH (ref 4.0–10.5)
nRBC: 1.1 % — ABNORMAL HIGH (ref 0.0–0.2)

## 2019-05-19 LAB — CULTURE, BLOOD (ROUTINE X 2)
Culture: NO GROWTH
Special Requests: ADEQUATE

## 2019-05-19 LAB — HEPARIN LEVEL (UNFRACTIONATED)
Heparin Unfractionated: 0.68 IU/mL (ref 0.30–0.70)
Heparin Unfractionated: 0.9 IU/mL — ABNORMAL HIGH (ref 0.30–0.70)

## 2019-05-19 LAB — MAGNESIUM: Magnesium: 2.6 mg/dL — ABNORMAL HIGH (ref 1.7–2.4)

## 2019-05-19 LAB — D-DIMER, QUANTITATIVE: D-Dimer, Quant: 12.72 ug/mL-FEU — ABNORMAL HIGH (ref 0.00–0.50)

## 2019-05-19 LAB — PHOSPHORUS: Phosphorus: 2.8 mg/dL (ref 2.5–4.6)

## 2019-05-19 LAB — LACTATE DEHYDROGENASE: LDH: 1091 U/L — ABNORMAL HIGH (ref 98–192)

## 2019-05-19 LAB — TROPONIN I: Troponin I: 0.06 ng/mL (ref <0.03)

## 2019-05-19 LAB — FERRITIN: Ferritin: 367 ng/mL — ABNORMAL HIGH (ref 24–336)

## 2019-05-19 LAB — CK: Total CK: 498 U/L — ABNORMAL HIGH (ref 49–397)

## 2019-05-19 LAB — TRIGLYCERIDES: Triglycerides: 192 mg/dL — ABNORMAL HIGH (ref <150)

## 2019-05-19 MED ORDER — ARTIFICIAL TEARS OPHTHALMIC OINT
1.0000 "application " | TOPICAL_OINTMENT | Freq: Three times a day (TID) | OPHTHALMIC | Status: DC
  Administered 2019-05-19 – 2019-05-20 (×4): 1 via OPHTHALMIC
  Filled 2019-05-19 (×2): qty 3.5

## 2019-05-19 MED ORDER — MORPHINE BOLUS VIA INFUSION
1.0000 mg | INTRAVENOUS | Status: DC | PRN
  Filled 2019-05-19: qty 1

## 2019-05-19 MED ORDER — ROCURONIUM BROMIDE 50 MG/5ML IV SOLN
100.0000 mg | Freq: Once | INTRAVENOUS | Status: AC
  Filled 2019-05-19: qty 10

## 2019-05-19 MED ORDER — MORPHINE 100MG IN NS 100ML (1MG/ML) PREMIX INFUSION
1.0000 mg/h | INTRAVENOUS | Status: DC
  Administered 2019-05-19 – 2019-05-20 (×2): 5 mg/h via INTRAVENOUS
  Filled 2019-05-19 (×2): qty 100

## 2019-05-19 MED ORDER — CHLORHEXIDINE GLUCONATE 0.12 % MT SOLN
15.0000 mL | Freq: Two times a day (BID) | OROMUCOSAL | Status: DC
  Administered 2019-05-19 – 2019-05-20 (×3): 15 mL via OROMUCOSAL
  Filled 2019-05-19: qty 15

## 2019-05-19 MED ORDER — MIDAZOLAM BOLUS VIA INFUSION
1.0000 mg | INTRAVENOUS | Status: DC | PRN
  Filled 2019-05-19: qty 2

## 2019-05-19 MED ORDER — VECURONIUM BROMIDE 10 MG IV SOLR
INTRAVENOUS | Status: AC
  Filled 2019-05-19: qty 10

## 2019-05-19 MED ORDER — MIDAZOLAM 50MG/50ML (1MG/ML) PREMIX INFUSION
2.0000 mg/h | INTRAVENOUS | Status: DC
  Administered 2019-05-19 – 2019-05-21 (×4): 4 mg/h via INTRAVENOUS
  Filled 2019-05-19 (×4): qty 50

## 2019-05-19 MED ORDER — ROCURONIUM BROMIDE 10 MG/ML (PF) SYRINGE
PREFILLED_SYRINGE | INTRAVENOUS | Status: AC
  Administered 2019-05-19: 100 mg
  Filled 2019-05-19: qty 10

## 2019-05-19 MED ORDER — METHYLPREDNISOLONE SODIUM SUCC 125 MG IJ SOLR
50.0000 mg | Freq: Two times a day (BID) | INTRAMUSCULAR | Status: DC
  Administered 2019-05-19 – 2019-05-22 (×7): 50 mg via INTRAVENOUS
  Filled 2019-05-19 (×8): qty 2

## 2019-05-19 MED ORDER — VECURONIUM BROMIDE 10 MG IV SOLR
0.0000 ug/kg/min | INTRAVENOUS | Status: DC
  Administered 2019-05-19: 1 ug/kg/min via INTRAVENOUS
  Filled 2019-05-19: qty 50

## 2019-05-19 MED ORDER — FAMOTIDINE 40 MG/5ML PO SUSR
20.0000 mg | Freq: Two times a day (BID) | ORAL | Status: DC
  Administered 2019-05-19 – 2019-05-28 (×19): 20 mg via ORAL
  Filled 2019-05-19 (×18): qty 2.5

## 2019-05-19 MED ORDER — MORPHINE SULFATE (PF) 2 MG/ML IV SOLN
2.0000 mg | Freq: Once | INTRAVENOUS | Status: AC
  Administered 2019-05-19: 2 mg via INTRAVENOUS

## 2019-05-19 MED ORDER — VECURONIUM BOLUS VIA INFUSION
0.0800 mg/kg | Freq: Once | INTRAVENOUS | Status: AC
  Administered 2019-05-19: 8.4 mg via INTRAVENOUS
  Filled 2019-05-19: qty 9

## 2019-05-19 NOTE — Progress Notes (Signed)
Patient's granddaughter(Brenda) was  called and updated on patient's condition

## 2019-05-19 NOTE — Progress Notes (Signed)
Spoke with granddaughter Suzan Slick about patient's condition, pending tests, and current medications.

## 2019-05-19 NOTE — Progress Notes (Signed)
[place] for heparin Indication: R/o PE in setting of COVID No Known Allergies  Patient Measurements: Height: '5\' 5"'  (165.1 cm) Weight: 232 lb 9.4 oz (105.5 kg) IBW/kg (Calculated) : 61.5 Heparin Dosing Weight: 84 kg   Vital Signs: Temp: 100.9 F (38.3 C) (05/31 0000) Temp Source: Axillary (05/31 0000) BP: 123/54 (05/31 0400) Pulse Rate: 88 (05/31 0400)  Labs: Recent Labs    05/17/19 0125  05/18/19 0100  05/18/19 0900 05/18/19 1044 05/18/19 1539 05/18/19 1601 05/19/19 0400 05/19/19 0500  HGB 14.8   < > 12.3*   < >  --  12.6*  --  13.6 14.5  --   HCT 44.8   < > 37.8*   < >  --  37.0*  --  40.0 44.0  --   PLT 316  --  230  --   --   --   --   --  361  --   HEPARINUNFRC  --    < > 0.24*  --  0.48  --  0.47  --   --  0.68  CREATININE 0.79  --  1.04  --   --   --   --   --   --   --   CKTOTAL 115  --  88  --   --   --   --   --   --   --    < > = values in this interval not displayed.    Estimated Creatinine Clearance: 81.3 mL/min (by C-G formula based on SCr of 1.04 mg/dL).   Medical History: Past Medical History:  Diagnosis Date  . Cataract   . Diabetes mellitus without complication ([place])   . Hyperlipidemia   . Hypertension   . Pleural plaque 10/05/2015   calcified pleural plaque bilaterally on CT chest    Medications:  Medications Prior to Admission  Medication Sig Dispense Refill Last Dose  . [EXPIRED] benzonatate (TESSALON) 200 MG capsule Take 1 capsule (200 mg total) by mouth 3 (three) times daily as needed for up to 7 days for cough. 28 capsule 0 Past Week at Unknown time  . metFORMIN (GLUCOPHAGE) 1000 MG tablet Take 1 tablet (1,000 mg total) by mouth 2 (two) times daily with a meal. 180 tablet 1 unk  . ondansetron (ZOFRAN) 4 MG tablet Take 1 tablet (4 mg total) by mouth every 8 (eight) hours as needed for nausea or vomiting. 12 tablet 0 Past Week at Unknown time  . ACCU-CHEK AVIVA PLUS test strip Check sugar once daily;  spanish label. 100 each 7 Taking  . ACCU-CHEK SOFTCLIX LANCETS lancets Check sugar once daily 100 each 3 Taking  . atorvastatin (LIPITOR) 20 MG tablet Take 1 tablet (20 mg total) by mouth daily. 90 tablet 1   . blood glucose meter kit and supplies Dispense based on patient and insurance preference. Use up to four times daily as directed. (FOR ICD-9 250.00, 250.01). 1 each 0 Taking  . Blood Glucose Monitoring Suppl (ACCU-CHEK AVIVA PLUS) w/Device KIT    Taking  . gabapentin (NEURONTIN) 300 MG capsule Take 1 capsule (300 mg total) by mouth 3 (three) times daily. 90 capsule 1   . glipiZIDE (GLUCOTROL XL) 10 MG 24 hr tablet Take 1 tablet (10 mg total) by mouth daily with breakfast. 90 tablet 1     Assessment: 20 YOM who presented with SOB and fevers found to be COVID +. Was started on IV antibiotics for  Sepsis. Now with worsening hypoxia and bump in D-dimer to 12.99. Pharmacy consulted to add IV heparin to r/o VTE treatment  IV heparin is currently running at 1400 units/hr. HL is now therapeutic at 0.48. H/H downtrending. Plt wnl. Of note, D-dimer up to >20 today.   05/19/19 6:14 AM  - HL at goal but trended up  - CBC stable - no bleeding/line issues reported  Goal of Therapy:  Heparin level 0.3-0.7 units/ml Monitor platelets by anticoagulation protocol: Yes   Plan:  -Continue heparin at 1400 units/hr  - Will obtain level in 8 hours to confirm still in range considering trend upwards  -Monitor daily HL, CBC and s/s of bleeding    Royetta Asal, PharmD, BCPS 05/19/2019 6:14 AM

## 2019-05-19 NOTE — Progress Notes (Signed)
[facility] P[facility]  Slightly worsening hypoxemia due to worsening ventilator synchrony PaO2:FiO2 ratio > 150 while paralyzed Add back paralytic overnight, try to wean off by AM Use morphine/versed for sedation  Heber [place], MD  PCCM Pager: [phone] Cell: [phone] If no response, call [phone]

## 2019-05-19 NOTE — Progress Notes (Signed)
[place] for heparin Indication: R/o PE in setting of COVID No Known Allergies  Patient Measurements: Height: '5\' 5"'  (165.1 cm) Weight: 232 lb 9.4 oz (105.5 kg) IBW/kg (Calculated) : 61.5 Heparin Dosing Weight: 84 kg   Vital Signs: Temp: 99.3 F (37.4 C) (05/31 1200) Temp Source: Oral (05/31 1200) BP: 99/55 (05/31 1500) Pulse Rate: 80 (05/31 1500)  Labs: Recent Labs    05/17/19 0125  05/18/19 0100  05/18/19 1539  05/19/19 0400 05/19/19 0500 05/19/19 1040 05/19/19 1101 05/19/19 1445 05/19/19 1448  HGB 14.8   < > 12.3*   < >  --    < > 14.5  --   --  13.9  --  12.9*  HCT 44.8   < > 37.8*   < >  --    < > 44.0  --   --  41.0  --  38.0*  PLT 316  --  230  --   --   --  361  --   --   --   --   --   HEPARINUNFRC  --   --  0.24*   < > 0.47  --   --  0.68  --   --  0.90*  --   CREATININE 0.79  --  1.04  --   --   --  0.90  --   --   --   --   --   CKTOTAL 115  --  88  --   --   --  498*  --   --   --   --   --   TROPONINI  --   --   --   --   --   --   --   --  0.06*  --   --   --    < > = values in this interval not displayed.    Estimated Creatinine Clearance: 94 mL/min (by C-G formula based on SCr of 0.9 mg/dL).   Medical History: Past Medical History:  Diagnosis Date  . Cataract   . Diabetes mellitus without complication ([place])   . Hyperlipidemia   . Hypertension   . Pleural plaque 10/05/2015   calcified pleural plaque bilaterally on CT chest    Medications:  Medications Prior to Admission  Medication Sig Dispense Refill Last Dose  . [EXPIRED] benzonatate (TESSALON) 200 MG capsule Take 1 capsule (200 mg total) by mouth 3 (three) times daily as needed for up to 7 days for cough. 28 capsule 0 Past Week at Unknown time  . metFORMIN (GLUCOPHAGE) 1000 MG tablet Take 1 tablet (1,000 mg total) by mouth 2 (two) times daily with a meal. 180 tablet 1 unk  . ondansetron (ZOFRAN) 4 MG tablet Take 1 tablet (4 mg total) by mouth every 8  (eight) hours as needed for nausea or vomiting. 12 tablet 0 Past Week at Unknown time  . ACCU-CHEK AVIVA PLUS test strip Check sugar once daily; spanish label. 100 each 7 Taking  . ACCU-CHEK SOFTCLIX LANCETS lancets Check sugar once daily 100 each 3 Taking  . atorvastatin (LIPITOR) 20 MG tablet Take 1 tablet (20 mg total) by mouth daily. 90 tablet 1   . blood glucose meter kit and supplies Dispense based on patient and insurance preference. Use up to four times daily as directed. (FOR ICD-9 250.00, 250.01). 1 each 0 Taking  . Blood Glucose Monitoring Suppl (ACCU-CHEK AVIVA PLUS) w/Device KIT  Taking  . gabapentin (NEURONTIN) 300 MG capsule Take 1 capsule (300 mg total) by mouth 3 (three) times daily. 90 capsule 1   . glipiZIDE (GLUCOTROL XL) 10 MG 24 hr tablet Take 1 tablet (10 mg total) by mouth daily with breakfast. 90 tablet 1     Assessment: 52 YOM who presented with SOB and fevers found to be COVID +. Was started on IV antibiotics for Sepsis. Now with worsening hypoxia and bump in D-dimer to 12.99. Pharmacy consulted to add IV heparin to r/o VTE treatment  IV heparin is currently running at 1400 units/hr but HL has now trended up to 0.9. H/H down slightly this afternoon. Plt wnl    Goal of Therapy:  Heparin level 0.3-0.7 units/ml Monitor platelets by anticoagulation protocol: Yes   Plan:  -Decrease IV heparin to 1250 units/hr  - Will obtain level in 8 hours to confirm still in range considering trend upwards  -Monitor daily HL, CBC and s/s of bleeding   Albertina Parr, PharmD., BCPS Clinical Pharmacist Clinical phone for 05/19/19 until 5pm: [phone]

## 2019-05-19 NOTE — Progress Notes (Signed)
NAME:  Lance Mason, MRN:  [id number], DOB:  1956/11/20, LOS: 5 ADMISSION DATE:  07-Jun-2019, CONSULTATION DATE:  05/15/2019 REFERRING MD:  Kerry Hough, CHIEF COMPLAINT:  Dyspnea   Brief History   63 year old male with diabetes type 2, hypertension admitted on May 26 with acute respiratory failure with hypoxemia due to COVID-19 pneumonia.   Past Medical History  Hypertension Hyperlipidemia DM 2  Significant Hospital Events   May 26 admission May 29 intubation, mechanical ventilation  Consults:  Pulmonary and critical care medicine  Procedures:  Endotracheal tube May 29 L subclavian CVL May 29 >   Significant Diagnostic Tests:  CT angiogram chest May 27 diffuse bilateral patchy airspace disease groundglass upper lobes predominant, no pulmonary embolism  Micro Data:  May 21 SARS-CoV-2 positive May 26 blood cultures  Antimicrobials/COVID treatment  May 26 azithromycin May 27 cefepime May 27 vancomycin May 27 remdesivir May 27 Tocilizumab  Interim history/subjective:  Severe hypoxemia Started on vasopressors overnight Continues to have ventilator dyssynchrony  Objective   Blood pressure (!) 106/49, pulse 83, temperature 98.9 F (37.2 C), temperature source Axillary, resp. rate 11, height 5\' 5"  (1.651 m), weight 105.5 kg, SpO2 92 %.    Vent Mode: PCV FiO2 (%):  [50 %-70 %] 50 % Set Rate:  [14 bmp] 14 bmp PEEP:  [10 cmH20-12 cmH20] 12 cmH20 Plateau Pressure:  [23 cmH20-26 cmH20] 23 cmH20   Intake/Output Summary (Last 24 hours) at 05/19/2019 7096 Last data filed at 05/19/2019 0700 Gross per 24 hour  Intake 5202.79 ml  Output 5174 ml  Net 28.79 ml   Filed Weights   Jun 07, 2019 2354  Weight: 105.5 kg    Examination:  General:  In bed on vent HENT: NCAT ETT in place PULM: CTA B, vent supported breathing CV: RRR, no mgr GI: BS+, soft, nontender MSK: normal bulk and tone Neuro: sedated on vent    Resolved Hospital Problem list     Assessment & Plan:   Acute respiratory failure with hypoxemia due to COVID-19 Severe ventilator dyssynchrony May 31 Increase sedation, RA SS target -3 to -4 One-time dose of neuromuscular blockade with rocuronium 1 mg/kg now Continue ARDS ventilator settings, repeat ABG after neuromuscular blockade to ensure that PaO2 to FiO2 ratio is greater than 150 Hold Lasix with Levophed use Check echocardiogram to monitor for evidence of possible pulmonary embolism Check x-ray today Ventilator associated pneumonia prevention protocol  Fluid overload Hold Lasix today  Shock: Likely related to hypovolemia from aggressive diuresis yesterday Possibly also related to sedation Question sepsis though source uncertain because has been on antibiotics Check procalcitonin Check echocardiogram to assess for cardiogenic cause Check EKG Check troponin Continue Levophed titrated to maintain a mean arterial pressure greater than 65  Elevated d-dimer: No evidence of clot I will discuss changing back to higher dose prophylaxis from anticoagulation  Mediastinal adenopathy: Likely reactive Repeat CT chest 73-month  Best practice:  Diet: tube feeding Pain/Anxiety/Delirium protocol (if indicated): yes, RASS score -3 to -4 VAP protocol (if indicated): yes DVT prophylaxis: heparin infusion for now GI prophylaxis: PPI Glucose control: per [facility] Mobility: bed rest Code Status: full Family Communication: per [facility] Disposition: remain in ICU  Labs   CBC: Recent Labs  Lab 05/15/19 0455  05/16/19 0455  05/17/19 0125  05/18/19 0100 05/18/19 0544 05/18/19 1044 05/18/19 1601 05/19/19 0400  WBC 7.3  --  8.7  --  9.4  --  13.6*  --   --   --  18.7*  NEUTROABS 5.4  --  6.9  --  8.3*  --  12.2*  --   --   --  14.4*  HGB 13.2   < > 14.3   < > 14.8   < > 12.3* 11.2* 12.6* 13.6 14.5  HCT 39.6   < > 41.7   < > 44.8   < > 37.8* 33.0* 37.0* 40.0 44.0  MCV 91.7  --  92.1  --  91.8  --  95.0  --   --   --  95.2  PLT 245  --  312  --  316   --  230  --   --   --  361   < > = values in this interval not displayed.    Basic Metabolic Panel: Recent Labs  Lab 05/15/19 0455  05/16/19 0455  05/17/19 0125  05/18/19 0100 05/18/19 0544 05/18/19 1044 05/18/19 1601 05/19/19 0400  NA 136   < > 139   < > 141   < > 150* 152* 154* 147* 149*  K 4.0   < > 3.8   < > 3.8   < > 4.2 4.0 4.4 4.4 3.7  CL 102  --  105  --  103  --  116*  --   --   --  112*  CO2 25  --  24  --  24  --  24  --   --   --  25  GLUCOSE 146*  --  169*  --  252*  --  164*  --   --   --  165*  BUN 10  --  10  --  19  --  37*  --   --   --  34*  CREATININE 0.68  --  0.65  --  0.79  --  1.04  --   --   --  0.90  CALCIUM 7.8*  --  7.8*  --  8.1*  --  7.9*  --   --   --  8.0*  MG 2.2  --  2.2  --  2.5*  --  2.8*  --   --   --  2.6*  PHOS 2.4*  --  3.1  --  2.3*  --  3.1  --   --   --  2.8   < > = values in this interval not displayed.   GFR: Estimated Creatinine Clearance: 94 mL/min (by C-G formula based on SCr of 0.9 mg/dL). Recent Labs  Lab 07-Apr-2019 1815 07-Apr-2019 2015  05/16/19 0455 05/17/19 0125 05/18/19 0100 05/19/19 0400  PROCALCITON 0.92  --   --   --   --   --   --   WBC 7.5  --    < > 8.7 9.4 13.6* 18.7*  LATICACIDVEN  --  1.5  --   --   --   --   --    < > = values in this interval not displayed.    Liver Function Tests: Recent Labs  Lab 05/15/19 0455 05/16/19 0455 05/17/19 0125 05/18/19 0100 05/19/19 0400  AST 106* 235* 118* 65* 63*  ALT 79* 165* 136* 88* 79*  ALKPHOS 100 160* 183* 186* 227*  BILITOT 0.5 0.5 0.4 0.1* 0.1*  PROT 7.3 7.0 7.1 6.5 7.4  ALBUMIN 2.7* 2.4* 2.6* 2.5* 3.1*   No results for input(s): LIPASE, AMYLASE in the last 168 hours. No results for input(s): AMMONIA in the last 168 hours.  ABG  Component Value Date/Time   PHART 7.377 05/18/2019 1601   PCO2ART 37.7 05/18/2019 1601   PO2ART 78.0 (L) 05/18/2019 1601   HCO3 22.1 05/18/2019 1601   TCO2 23 05/18/2019 1601   ACIDBASEDEF 3.0 (H) 05/18/2019 1601   O2SAT  95.0 05/18/2019 1601     Coagulation Profile: No results for input(s): INR, PROTIME in the last 168 hours.  Cardiac Enzymes: Recent Labs  Lab 2019-05-24 1815  05/24/2019 2018 05/15/19 0450 05/15/19 0455 05/16/19 0455 05/17/19 0125 05/18/19 0100 05/19/19 0400  CKTOTAL  --    < > 62  --  74 64 115 88 498*  CKMB  --   --  1.7  --   --   --   --   --   --   TROPONINI <0.03  --   --  <0.03  --   --   --   --   --    < > = values in this interval not displayed.    HbA1C: Hemoglobin A1C  Date/Time Value Ref Range Status  06/19/2018 04:01 PM 9.1 (A) 4.0 - 5.6 % Final  01/02/2017 06:51 PM 12.8  Final    CBG: Recent Labs  Lab 05/19/19 0418 05/19/19 0524 05/19/19 0624 05/19/19 0702 05/19/19 0803  GLUCAP 154* 134* 207* 221* 180*     Critical care time: 40 minutes     Heber [place], MD [place] [facility] Pager: [phone] Cell: [phone] If no response, call [phone]

## 2019-05-19 NOTE — Progress Notes (Signed)
PROGRESS NOTE  Lance Mason Lance Mason YHO:[id number] DOB: 09/15/1956 DOA: 05/16/2019 PCP: Patient, No Pcp Per   LOS: 5 days   Brief Narrative / Interim history: 63 year old male with history of diabetes, hyperlipidemia, was admitted to the hospital 2019/05/16 with shortness of breath, found to have hypoxic respiratory failure due to COVID-19 pneumonia.  He was admitted to the ICU requiring nonrebreather mask.  He received a dose of Actemra on 5/27 and was started on a course of Remdesivir.  He under went a CT angiogram on 5/27 with diffuse bilateral patchy airspace disease with groundglass appearance, no PE  Subjective: -Remains intubated, dyssynchronous with the vent this morning  Assessment & Plan: Principal Problem:   Acute respiratory failure with hypoxia (HCC) Active Problems:   Type 2 diabetes mellitus without complication, without long-term current use of insulin (HCC)   Pure hypercholesterolemia   COVID-19 virus infection   CAP (community acquired pneumonia)   Principal Problem Acute Hypoxic Respiratory Failure due to Covid-19 Viral Illness -Intubated on 05/17/2019, vent support and sedation per critical care  Vent Mode: PCV FiO2 (%):  [50 %-70 %] 50 % Set Rate:  [14 bmp-28 bmp] 28 bmp PEEP:  [12 cmH20] 12 cmH20 Plateau Pressure:  [23 cmH20-26 cmH20] 23 cmH20 -COVID-19 specific treatment  Remdesivir started 5/27  Actemra on 5/27  Convalescent plasma 5/29 -Continue steroids -Diuresed yesterday 5/13, net -5.3 L, hypotensive last night requiring Levophed.  Given increased white count as well will culture.  He was already on vancomycin and cefepime which we will continue.  Given his significant elevation in d-dimer will check lower extremity ultrasound, given hypotension look at RV  COVID-19 Labs  Recent Labs    05/17/19 0125 05/18/19 0100 05/19/19 0400 05/19/19 0500  DDIMER 12.99* >20.00* 12.72*  --   FERRITIN 2,413* 1,162*  --  367*  LDH 762* 685* 1,091*  --   CRP  15.0* 7.5*  --  4.1*   D-dimer continues to increase, patient is on full dose heparin since 5/29  Lab Results  Component Value Date   SARSCOV2NAA DETECTED (A) 05/08/2019    Type 2 diabetes mellitus -Persistently elevated CBGs last night likely due to steroids requiring an insulin drip.  Will attempt to wean drip today  Elevated LFTs -Possibly related to Covid versus Actemra, peaked and now trending down.  We will continue to monitor.   Scheduled Meds: . chlorhexidine  15 mL Mouth/Throat BID  . Chlorhexidine Gluconate Cloth  6 each Topical q morning - 10a  . famotidine  20 mg Oral BID  . free water  250 mL Per Tube Q4H  . guaiFENesin-dextromethorphan  15 mL Oral Q6H  . mouth rinse  15 mL Mouth Rinse 10 times per day  . methylPREDNISolone (SOLU-MEDROL) injection  50 mg Intravenous Q12H  . multivitamin with minerals  1 tablet Per Tube Daily  . sodium chloride flush  10-40 mL Intracatheter Q12H  . vecuronium       Continuous Infusions: . sodium chloride 10 mL/hr at 05/19/19 0700  . sodium chloride 100 mL/hr at 05/19/19 0400  . azithromycin Stopped (05/18/19 2313)  . ceFEPime (MAXIPIME) IV Stopped (05/19/19 0640)  . feeding supplement (VITAL HIGH PROTEIN) 1,000 mL (05/19/19 0606)  . heparin 1,400 Units/hr (05/19/19 0700)  . insulin 7.1 Units/hr (05/19/19 1044)  . midazolam 4 mg/hr (05/19/19 0810)  . morphine 4 mg/hr (05/19/19 0700)  . norepinephrine (LEVOPHED) Adult infusion 20 mcg/min (05/19/19 0700)  . remdesivir 100 mg in NS 250 mL 100  mg (05/18/19 1201)   PRN Meds:.sodium chloride, acetaminophen, metoprolol tartrate, morphine injection, morphine, ondansetron (ZOFRAN) IV, polyethylene glycol, polyvinyl alcohol, sodium chloride flush  DVT prophylaxis: Lovenox Code Status: Full code Family Communication: Discussed with granddaughter over the phone on 5/29 Disposition Plan: TBD  Consultants:   Critical care  Procedures:   None   Antimicrobials:  Vancomycin 5/26 >>  5/28  Cefepime 5/26 >>  Azithromycin 5/26 >>   Objective: Vitals:   05/19/19 0720 05/19/19 0800 05/19/19 0942 05/19/19 1100  BP: (!) 105/50 (!) 106/49    Pulse: 81 83    Resp: 18 11    Temp:  99.5 F (37.5 C)    TempSrc:  Oral    SpO2: 96% 92% 94% 93%  Weight:      Height:        Intake/Output Summary (Last 24 hours) at 05/19/2019 1120 Last data filed at 05/19/2019 0700 Gross per 24 hour  Intake 4939.63 ml  Output 4674 ml  Net 265.63 ml   Filed Weights   04/21/2019 2354  Weight: 105.5 kg    Examination:  Constitutional: Intubated, sedated Eyes: No icterus ENMT: Mucous membranes are dry.  Respiratory: Distant breath sounds, no wheezing, no significant crackles Cardiovascular: Regular rate and rhythm, no murmurs.  No peripheral edema Abdomen: Soft, positive bowel sounds Musculoskeletal: no clubbing / cyanosis.  Skin: No rashes seen Neurologic: Sedated   Data Reviewed: I have independently reviewed following labs and imaging studies   CBC: Recent Labs  Lab 05/15/19 0455  05/16/19 0455  05/17/19 0125  05/18/19 0100 05/18/19 0544 05/18/19 1044 05/18/19 1601 05/19/19 0400 05/19/19 1101  WBC 7.3  --  8.7  --  9.4  --  13.6*  --   --   --  18.7*  --   NEUTROABS 5.4  --  6.9  --  8.3*  --  12.2*  --   --   --  14.4*  --   HGB 13.2   < > 14.3   < > 14.8   < > 12.3* 11.2* 12.6* 13.6 14.5 13.9  HCT 39.6   < > 41.7   < > 44.8   < > 37.8* 33.0* 37.0* 40.0 44.0 41.0  MCV 91.7  --  92.1  --  91.8  --  95.0  --   --   --  95.2  --   PLT 245  --  312  --  316  --  230  --   --   --  361  --    < > = values in this interval not displayed.   Basic Metabolic Panel: Recent Labs  Lab 05/15/19 0455  05/16/19 0455  05/17/19 0125  05/18/19 0100 05/18/19 0544 05/18/19 1044 05/18/19 1601 05/19/19 0400 05/19/19 1101  NA 136   < > 139   < > 141   < > 150* 152* 154* 147* 149* 148*  K 4.0   < > 3.8   < > 3.8   < > 4.2 4.0 4.4 4.4 3.7 3.6  CL 102  --  105  --  103  --   116*  --   --   --  112*  --   CO2 25  --  24  --  24  --  24  --   --   --  25  --   GLUCOSE 146*  --  169*  --  252*  --  164*  --   --   --  165*  --   BUN 10  --  10  --  19  --  37*  --   --   --  34*  --   CREATININE 0.68  --  0.65  --  0.79  --  1.04  --   --   --  0.90  --   CALCIUM 7.8*  --  7.8*  --  8.1*  --  7.9*  --   --   --  8.0*  --   MG 2.2  --  2.2  --  2.5*  --  2.8*  --   --   --  2.6*  --   PHOS 2.4*  --  3.1  --  2.3*  --  3.1  --   --   --  2.8  --    < > = values in this interval not displayed.   GFR: Estimated Creatinine Clearance: 94 mL/min (by C-G formula based on SCr of 0.9 mg/dL). Liver Function Tests: Recent Labs  Lab 05/15/19 0455 05/16/19 0455 05/17/19 0125 05/18/19 0100 05/19/19 0400  AST 106* 235* 118* 65* 63*  ALT 79* 165* 136* 88* 79*  ALKPHOS 100 160* 183* 186* 227*  BILITOT 0.5 0.5 0.4 0.1* 0.1*  PROT 7.3 7.0 7.1 6.5 7.4  ALBUMIN 2.7* 2.4* 2.6* 2.5* 3.1*   No results for input(s): LIPASE, AMYLASE in the last 168 hours. No results for input(s): AMMONIA in the last 168 hours. Coagulation Profile: No results for input(s): INR, PROTIME in the last 168 hours. Cardiac Enzymes: Recent Labs  Lab 04/25/2019 1815  05/17/2019 2018 05/15/19 0450 05/15/19 0455 05/16/19 0455 05/17/19 0125 05/18/19 0100 05/19/19 0400  CKTOTAL  --    < > 62  --  74 64 115 88 498*  CKMB  --   --  1.7  --   --   --   --   --   --   TROPONINI <0.03  --   --  <0.03  --   --   --   --   --    < > = values in this interval not displayed.   BNP (last 3 results) No results for input(s): PROBNP in the last 8760 hours. HbA1C: No results for input(s): HGBA1C in the last 72 hours. CBG: Recent Labs  Lab 05/19/19 0524 05/19/19 0624 05/19/19 0702 05/19/19 0803 05/19/19 0905  GLUCAP 134* 207* 221* 180* 169*   Lipid Profile: Recent Labs    05/18/19 0100 05/19/19 0400  TRIG 210* 192*   Thyroid Function Tests: No results for input(s): TSH, T4TOTAL, FREET4, T3FREE,  THYROIDAB in the last 72 hours. Anemia Panel: Recent Labs    05/18/19 0100 05/19/19 0500  FERRITIN 1,162* 367*   Urine analysis:    Component Value Date/Time   COLORURINE YELLOW 05/28/2016 2056   APPEARANCEUR CLEAR 05/28/2016 2056   LABSPEC 1.041 (H) 05/28/2016 2056   PHURINE 5.0 05/28/2016 2056   GLUCOSEU >1000 (A) 05/28/2016 2056   HGBUR NEGATIVE 05/28/2016 2056   BILIRUBINUR negative 06/19/2018 1601   KETONESUR negative 06/19/2018 1601   KETONESUR 15 (A) 05/28/2016 2056   PROTEINUR negative 06/19/2018 1601   PROTEINUR NEGATIVE 05/28/2016 2056   UROBILINOGEN 0.2 06/19/2018 1601   NITRITE Negative 06/19/2018 1601   NITRITE NEGATIVE 05/28/2016 2056   LEUKOCYTESUR Negative 06/19/2018 1601   Sepsis Labs: Invalid input(s): PROCALCITONIN, LACTICIDVEN  Recent Results (from the past 240 hour(s))  Culture, blood (Routine X 2) w Reflex to  ID Panel     Status: None (Preliminary result)   Collection Time: 08-20-2019  8:18 PM  Result Value Ref Range Status   Specimen Description   Final    BLOOD BLOOD LEFT FOREARM Performed at [facility], [address] [place], [place] [address]    Special Requests   Final    BOTTLES DRAWN AEROBIC AND ANAEROBIC Blood Culture adequate volume Performed at [facility], [address] [place], [place] [address]    Culture   Final    NO GROWTH 4 DAYS Performed at [facility], [address] [place], [place] [address]    Report Status PENDING  Incomplete  MRSA PCR Screening     Status: None   Collection Time: 05/15/19  1:55 PM  Result Value Ref Range Status   MRSA by PCR NEGATIVE NEGATIVE Final    Comment:        The GeneXpert MRSA Assay (FDA approved for NASAL specimens only), is one component of a comprehensive MRSA colonization surveillance program. It is not intended to diagnose MRSA infection nor to guide or monitor treatment for MRSA infections. Performed at [facility], [address] [place], [place] [address]       Radiology Studies: Dg Abd 1 View  Result Date: 05/17/2019 CLINICAL DATA:  Encounter for feeding tube placement. EXAM: ABDOMEN - 1 VIEW COMPARISON:  None. FINDINGS: The bowel gas pattern is normal. Distal tip of nasogastric tube is seen in proximal stomach. Side hole is in expected position of gastroesophageal junction. No radio-opaque calculi or other significant radiographic abnormality are seen. IMPRESSION: Distal tip of nasogastric tube seen in proximal stomach. Electronically Signed   By: Lupita RaiderJames  Green Jr M.D.   On: 05/17/2019 13:49   Dg Chest Port 1 View  Result Date: 05/18/2019 CLINICAL DATA:  COVID-19 viral infection, ETT EXAM: PORTABLE CHEST 1 VIEW COMPARISON:  05/17/2019 FINDINGS: Endotracheal tube terminates 4 cm above the carina. Multifocal patchy/airspace opacities bilaterally. No definite pleural effusions. Cardiomegaly. Left subclavian venous catheter terminates cavoatrial junction. Enteric tube courses into the stomach. IMPRESSION: Multifocal patchy/airspace opacities bilaterally, compatible with known pneumonia, grossly unchanged. Endotracheal tube terminates 4 cm above the carina. Additional support apparatus as above. Electronically Signed   By: Charline BillsSriyesh  Krishnan M.D.   On: 05/18/2019 06:55   Dg Chest Port 1 View  Result Date: 05/17/2019 CLINICAL DATA:  Encounter for intubation, feeding tube placement and central line placement. EXAM: PORTABLE CHEST 1 VIEW COMPARISON:  Radiograph of same day. FINDINGS: The heart size and mediastinal contours are within normal limits. Endotracheal tube is in grossly good position. Distal tip of nasogastric tube is seen in proximal stomach. Left subclavian catheter is noted with tip in expected position of the SVC. Stable bilateral lung opacities are noted consistent with multifocal pneumonia or edema. Calcified pleural plaques are noted bilaterally suggesting asbestos exposure. The visualized  skeletal structures are unremarkable. IMPRESSION: Endotracheal tube is in grossly good position. Distal tip of nasogastric tube is seen in proximal stomach. Left subclavian catheter is in good position. Stable bilateral lung opacities are noted concerning for multifocal pneumonia or possibly edema. Electronically Signed   By: Lupita RaiderJames  Green Jr M.D.   On: 05/17/2019 13:48    Pamella Pertostin Jerauld Bostwick, MD, PhD [facility]  Contact via  [email or web address]  [facility] Office Info P: [phone]  F: [phone]

## 2019-05-20 ENCOUNTER — Inpatient Hospital Stay (HOSPITAL_COMMUNITY): Payer: BLUE CROSS/BLUE SHIELD

## 2019-05-20 DIAGNOSIS — R7989 Other specified abnormal findings of blood chemistry: Secondary | ICD-10-CM

## 2019-05-20 DIAGNOSIS — M7989 Other specified soft tissue disorders: Secondary | ICD-10-CM

## 2019-05-20 DIAGNOSIS — U071 COVID-19: Secondary | ICD-10-CM

## 2019-05-20 DIAGNOSIS — R0600 Dyspnea, unspecified: Secondary | ICD-10-CM

## 2019-05-20 DIAGNOSIS — R509 Fever, unspecified: Secondary | ICD-10-CM

## 2019-05-20 DIAGNOSIS — R945 Abnormal results of liver function studies: Secondary | ICD-10-CM

## 2019-05-20 LAB — GLUCOSE, CAPILLARY
Glucose-Capillary: 115 mg/dL — ABNORMAL HIGH (ref 70–99)
Glucose-Capillary: 128 mg/dL — ABNORMAL HIGH (ref 70–99)
Glucose-Capillary: 128 mg/dL — ABNORMAL HIGH (ref 70–99)
Glucose-Capillary: 133 mg/dL — ABNORMAL HIGH (ref 70–99)
Glucose-Capillary: 135 mg/dL — ABNORMAL HIGH (ref 70–99)
Glucose-Capillary: 142 mg/dL — ABNORMAL HIGH (ref 70–99)
Glucose-Capillary: 143 mg/dL — ABNORMAL HIGH (ref 70–99)
Glucose-Capillary: 145 mg/dL — ABNORMAL HIGH (ref 70–99)
Glucose-Capillary: 146 mg/dL — ABNORMAL HIGH (ref 70–99)
Glucose-Capillary: 147 mg/dL — ABNORMAL HIGH (ref 70–99)
Glucose-Capillary: 150 mg/dL — ABNORMAL HIGH (ref 70–99)
Glucose-Capillary: 164 mg/dL — ABNORMAL HIGH (ref 70–99)
Glucose-Capillary: 166 mg/dL — ABNORMAL HIGH (ref 70–99)
Glucose-Capillary: 166 mg/dL — ABNORMAL HIGH (ref 70–99)
Glucose-Capillary: 189 mg/dL — ABNORMAL HIGH (ref 70–99)
Glucose-Capillary: 292 mg/dL — ABNORMAL HIGH (ref 70–99)
Glucose-Capillary: 305 mg/dL — ABNORMAL HIGH (ref 70–99)
Glucose-Capillary: 319 mg/dL — ABNORMAL HIGH (ref 70–99)
Glucose-Capillary: 339 mg/dL — ABNORMAL HIGH (ref 70–99)
Glucose-Capillary: 399 mg/dL — ABNORMAL HIGH (ref 70–99)
Glucose-Capillary: 459 mg/dL — ABNORMAL HIGH (ref 70–99)
Glucose-Capillary: 474 mg/dL — ABNORMAL HIGH (ref 70–99)

## 2019-05-20 LAB — COMPREHENSIVE METABOLIC PANEL
ALT: 60 U/L — ABNORMAL HIGH (ref 0–44)
AST: 53 U/L — ABNORMAL HIGH (ref 15–41)
Albumin: 2.6 g/dL — ABNORMAL LOW (ref 3.5–5.0)
Alkaline Phosphatase: 150 U/L — ABNORMAL HIGH (ref 38–126)
Anion gap: 8 (ref 5–15)
BUN: 26 mg/dL — ABNORMAL HIGH (ref 8–23)
CO2: 32 mmol/L (ref 22–32)
Calcium: 7.9 mg/dL — ABNORMAL LOW (ref 8.9–10.3)
Chloride: 101 mmol/L (ref 98–111)
Creatinine, Ser: 0.61 mg/dL (ref 0.61–1.24)
GFR calc Af Amer: >60 mL/min (ref >60)
GFR calc non Af Amer: >60 mL/min (ref >60)
Glucose, Bld: 156 mg/dL — ABNORMAL HIGH (ref 70–99)
Potassium: 4.3 mmol/L (ref 3.5–5.1)
Sodium: 141 mmol/L (ref 135–145)
Total Bilirubin: 0.2 mg/dL — ABNORMAL LOW (ref 0.3–1.2)
Total Protein: 6 g/dL — ABNORMAL LOW (ref 6.5–8.1)

## 2019-05-20 LAB — POCT I-STAT 7, (LYTES, BLD GAS, ICA,H+H)
Acid-Base Excess: 7 mmol/L — ABNORMAL HIGH (ref 0.0–2.0)
Bicarbonate: 34.2 mmol/L — ABNORMAL HIGH (ref 20.0–28.0)
Calcium, Ion: 1.1 mmol/L — ABNORMAL LOW (ref 1.15–1.40)
HCT: 38 % — ABNORMAL LOW (ref 39.0–52.0)
Hemoglobin: 12.9 g/dL — ABNORMAL LOW (ref 13.0–17.0)
O2 Saturation: 90 %
Patient temperature: 98.6
Potassium: 4.3 mmol/L (ref 3.5–5.1)
Sodium: 144 mmol/L (ref 135–145)
TCO2: 36 mmol/L — ABNORMAL HIGH (ref 22–32)
pCO2 arterial: 62.1 mmHg — ABNORMAL HIGH (ref 32.0–48.0)
pH, Arterial: 7.349 — ABNORMAL LOW (ref 7.350–7.450)
pO2, Arterial: 64 mmHg — ABNORMAL LOW (ref 83.0–108.0)

## 2019-05-20 LAB — ECHOCARDIOGRAM COMPLETE
Height: 65 in
Weight: 3724.89 oz

## 2019-05-20 LAB — PROCALCITONIN: Procalcitonin: 0.29 ng/mL

## 2019-05-20 LAB — HEPARIN LEVEL (UNFRACTIONATED): Heparin Unfractionated: 0.82 IU/mL — ABNORMAL HIGH (ref 0.30–0.70)

## 2019-05-20 LAB — FERRITIN: Ferritin: 627 ng/mL — ABNORMAL HIGH (ref 24–336)

## 2019-05-20 LAB — D-DIMER, QUANTITATIVE: D-Dimer, Quant: 5.03 ug/mL-FEU — ABNORMAL HIGH (ref 0.00–0.50)

## 2019-05-20 LAB — C-REACTIVE PROTEIN: CRP: 1.6 mg/dL — ABNORMAL HIGH (ref <1.0)

## 2019-05-20 LAB — LACTATE DEHYDROGENASE: LDH: 649 U/L — ABNORMAL HIGH (ref 98–192)

## 2019-05-20 MED ORDER — INSULIN ASPART 100 UNIT/ML ~~LOC~~ SOLN
0.0000 [IU] | Freq: Every day | SUBCUTANEOUS | Status: DC
  Administered 2019-05-20: 3 [IU] via SUBCUTANEOUS

## 2019-05-20 MED ORDER — INSULIN ASPART 100 UNIT/ML ~~LOC~~ SOLN
0.0000 [IU] | SUBCUTANEOUS | Status: DC
  Administered 2019-05-20: 11 [IU] via SUBCUTANEOUS
  Administered 2019-05-20: 3 [IU] via SUBCUTANEOUS

## 2019-05-20 MED ORDER — ENOXAPARIN SODIUM 60 MG/0.6ML ~~LOC~~ SOLN
50.0000 mg | Freq: Two times a day (BID) | SUBCUTANEOUS | Status: DC
  Administered 2019-05-20 (×2): 50 mg via SUBCUTANEOUS
  Filled 2019-05-20 (×2): qty 0.6

## 2019-05-20 MED ORDER — ENOXAPARIN SODIUM 60 MG/0.6ML ~~LOC~~ SOLN
50.0000 mg | Freq: Two times a day (BID) | SUBCUTANEOUS | Status: DC
  Administered 2019-05-21 – 2019-05-26 (×11): 50 mg via SUBCUTANEOUS
  Filled 2019-05-20 (×11): qty 0.6

## 2019-05-20 MED ORDER — INSULIN GLARGINE 100 UNIT/ML ~~LOC~~ SOLN
20.0000 [IU] | Freq: Every day | SUBCUTANEOUS | Status: DC
  Administered 2019-05-20 – 2019-05-21 (×2): 20 [IU] via SUBCUTANEOUS
  Filled 2019-05-20 (×3): qty 0.2

## 2019-05-20 MED ORDER — INSULIN ASPART 100 UNIT/ML ~~LOC~~ SOLN
0.0000 [IU] | SUBCUTANEOUS | Status: DC
  Administered 2019-05-20: 8 [IU] via SUBCUTANEOUS
  Administered 2019-05-20: 11 [IU] via SUBCUTANEOUS
  Administered 2019-05-21: 5 [IU] via SUBCUTANEOUS
  Administered 2019-05-21: 8 [IU] via SUBCUTANEOUS
  Administered 2019-05-21: 5 [IU] via SUBCUTANEOUS
  Administered 2019-05-21: 11 [IU] via SUBCUTANEOUS

## 2019-05-20 MED ORDER — HEPARIN (PORCINE) 25000 UT/250ML-% IV SOLN: 1100.0000 [IU]/h | INTRAVENOUS | Status: DC

## 2019-05-20 NOTE — Progress Notes (Signed)
[place] for heparin Indication: R/o PE in setting of COVID No Known Allergies  Patient Measurements: Height: '5\' 5"'  (165.1 cm) Weight: 232 lb 12.9 oz (105.6 kg) IBW/kg (Calculated) : 61.5 Heparin Dosing Weight: 84 kg   Vital Signs: Temp: 98.8 F (37.1 C) (06/01 0400) Temp Source: Axillary (06/01 0400) BP: 116/71 (06/01 0400) Pulse Rate: 118 (06/01 0400)  Labs: Recent Labs    05/18/19 0100  05/19/19 0400 05/19/19 0500 05/19/19 1040 05/19/19 1101 05/19/19 1445 05/19/19 1448 05/20/19 0230 05/20/19 0425  HGB 12.3*   < > 14.5  --   --  13.9  --  12.9*  --  12.9*  HCT 37.8*   < > 44.0  --   --  41.0  --  38.0*  --  38.0*  PLT 230  --  361  --   --   --   --   --   --   --   HEPARINUNFRC 0.24*   < >  --  0.68  --   --  0.90*  --  0.82*  --   CREATININE 1.04  --  0.90  --   --   --   --   --   --   --   CKTOTAL 88  --  498*  --   --   --   --   --   --   --   TROPONINI  --   --   --   --  0.06*  --   --   --   --   --    < > = values in this interval not displayed.    Estimated Creatinine Clearance: 94 mL/min (by C-G formula based on SCr of 0.9 mg/dL).   Medical History: Past Medical History:  Diagnosis Date  . Cataract   . Diabetes mellitus without complication ([place])   . Hyperlipidemia   . Hypertension   . Pleural plaque 10/05/2015   calcified pleural plaque bilaterally on CT chest    Medications:  Medications Prior to Admission  Medication Sig Dispense Refill Last Dose  . [EXPIRED] benzonatate (TESSALON) 200 MG capsule Take 1 capsule (200 mg total) by mouth 3 (three) times daily as needed for up to 7 days for cough. 28 capsule 0 Past Week at Unknown time  . metFORMIN (GLUCOPHAGE) 1000 MG tablet Take 1 tablet (1,000 mg total) by mouth 2 (two) times daily with a meal. 180 tablet 1 unk  . ondansetron (ZOFRAN) 4 MG tablet Take 1 tablet (4 mg total) by mouth every 8 (eight) hours as needed for nausea or vomiting. 12 tablet 0 Past  Week at Unknown time  . ACCU-CHEK AVIVA PLUS test strip Check sugar once daily; spanish label. 100 each 7 Taking  . ACCU-CHEK SOFTCLIX LANCETS lancets Check sugar once daily 100 each 3 Taking  . atorvastatin (LIPITOR) 20 MG tablet Take 1 tablet (20 mg total) by mouth daily. 90 tablet 1   . blood glucose meter kit and supplies Dispense based on patient and insurance preference. Use up to four times daily as directed. (FOR ICD-9 250.00, 250.01). 1 each 0 Taking  . Blood Glucose Monitoring Suppl (ACCU-CHEK AVIVA PLUS) w/Device KIT    Taking  . gabapentin (NEURONTIN) 300 MG capsule Take 1 capsule (300 mg total) by mouth 3 (three) times daily. 90 capsule 1   . glipiZIDE (GLUCOTROL XL) 10 MG 24 hr tablet Take 1 tablet (10 mg total) by  mouth daily with breakfast. 90 tablet 1     Assessment: 18 YOM who presented with SOB and fevers found to be COVID +. Was started on IV antibiotics for Sepsis. Now with worsening hypoxia and bump in D-dimer to 12.99. Pharmacy consulted to add IV heparin to r/o VTE treatment  Today, 05/20/19   HL 0.82, supratherapeutic  Hgb 12.9, stable  No lin eor bleeding issues per RN     Goal of Therapy:  Heparin level 0.3-0.7 units/ml Monitor platelets by anticoagulation protocol: Yes   Plan:  -Decrease IV heparin to 1100 units/hr  - Will obtain level in 8 hours  -Monitor daily HL, CBC and s/s of bleeding    Royetta Asal, PharmD, BCPS 05/20/2019 5:56 AM

## 2019-05-20 NOTE — Plan of Care (Signed)
  Problem: Clinical Measurements: Goal: Ability to maintain clinical measurements within normal limits will improve Outcome: Progressing   

## 2019-05-20 NOTE — Progress Notes (Signed)
I spoke with two daughters and they face timed with patient from 2310-2333.

## 2019-05-20 NOTE — Progress Notes (Signed)
Spoke with Steward Drone patient's granddaughter and gave her up dates on patient. She would like to face time with patient this evening. Told her we would call her and face time with her tonight.

## 2019-05-20 NOTE — Progress Notes (Signed)
NAME:  Lance Mason, MRN:  [id number], DOB:  1956/12/16, LOS: 6 ADMISSION DATE:  05/19/2019, CONSULTATION DATE:  05/15/2019 REFERRING MD:  Kerry HoughMemon, CHIEF COMPLAINT:  Dyspnea   Brief History   63 year old male with diabetes type 2, hypertension admitted on May 26 with acute respiratory failure with hypoxemia due to COVID-19 pneumonia.   Past Medical History  Hypertension Hyperlipidemia DM 2  Significant Hospital Events   May 26 admission May 29 intubation, mechanical ventilation May 31 worsening dyssynchrony, hypoxemia, added vecuronium June 1 extubated overnight   Consults:  Pulmonary and critical care medicine  Procedures:  Endotracheal tube May 29 L subclavian CVL May 29 >   Significant Diagnostic Tests:  CT angiogram chest May 27 diffuse bilateral patchy airspace disease groundglass upper lobes predominant, no pulmonary embolism June 1 bilateral lower ext doppler > neg DVT June 1 TTE >>>  Micro Data:  May 21 SARS-CoV-2 positive May 26 blood cultures  Antimicrobials/COVID treatment  May 26 azithromycin May 27 cefepime May 27 vancomycin May 27 remdesivir May 27 Tocilizumab  Interim history/subjective:   Hypoxemia about the same Placed on paralytics overnight Making urine   Objective   Blood pressure 110/69, pulse (!) 110, temperature 98.8 F (37.1 C), temperature source Axillary, resp. rate (!) 28, height 5\' 5"  (1.651 m), weight 105.6 kg, SpO2 (!) 89 %. CVP:  [7 mmHg-11 mmHg] 8 mmHg  Vent Mode: PCV FiO2 (%):  [40 %-50 %] 40 % Set Rate:  [22 bmp-28 bmp] 28 bmp PEEP:  [12 cmH20] 12 cmH20 Plateau Pressure:  [20 cmH20-26 cmH20] 20 cmH20   Intake/Output Summary (Last 24 hours) at 05/20/2019 0851 Last data filed at 05/20/2019 0700 Gross per 24 hour  Intake 5327.2 ml  Output 2475 ml  Net 2852.2 ml   Filed Weights   05/10/2019 2354 05/20/19 0500  Weight: 105.5 kg 105.6 kg    Examination:  General:  In bed on vent HENT: NCAT ETT in place PULM: CTA  B, vent supported breathing CV: RRR, no mgr GI: BS+, soft, nontender MSK: normal bulk and tone Neuro: sedated on vent    Resolved Hospital Problem list     Assessment & Plan:  Acute respiratory failure with hypoxemia due to COVID-19 Severe ventilator dyssynchrony May 31 RASS goal -5, stop vecuronium for now and monitor vent synchrony, if synchronous would change RASS goal -3 to -4 Continue ARDS ventilator settings, TVol 6-8cc/kg IBW Hold lasix VAP prevention protocol  Fluid overload Hold lasix while on low dose levophed  Shock: Likely related to hypovolemia from aggressive diuresis yesterday Possibly also related to sedation F/u echo Wean off levophed for MAP > 65  Elevated d-dimer: No evidence of clot Change from heparin infusion to prophylactic anticoagulation  Mediastinal adenopathy: Likely reactive Repeat CT chest 3 months  Best practice:  Diet: tube feeding Pain/Anxiety/Delirium protocol (if indicated): yes, RASS score -5 VAP protocol (if indicated): yes DVT prophylaxis: heparin infusion for now GI prophylaxis: PPI Glucose control: per [facility] Mobility: bed rest Code Status: full Family [facility] Disposition: remain in ICU  Labs   CBC: Recent Labs  Lab 05/15/19 0455  05/16/19 0455  05/17/19 0125  05/18/19 0100  05/18/19 1601 05/19/19 0400 05/19/19 1101 05/19/19 1448 05/20/19 0425  WBC 7.3  --  8.7  --  9.4  --  13.6*  --   --  18.7*  --   --   --   NEUTROABS 5.4  --  6.9  --  8.3*  --  12.2*  --   --  14.4*  --   --   --   HGB 13.2   < > 14.3   < > 14.8   < > 12.3*   < > 13.6 14.5 13.9 12.9* 12.9*  HCT 39.6   < > 41.7   < > 44.8   < > 37.8*   < > 40.0 44.0 41.0 38.0* 38.0*  MCV 91.7  --  92.1  --  91.8  --  95.0  --   --  95.2  --   --   --   PLT 245  --  312  --  316  --  230  --   --  361  --   --   --    < > = values in this interval not displayed.    Basic Metabolic Panel: Recent Labs  Lab 05/15/19 0455  05/16/19 0455  05/17/19  0125  05/18/19 0100  05/19/19 0400 05/19/19 1101 05/19/19 1448 05/20/19 0230 05/20/19 0425  NA 136   < > 139   < > 141   < > 150*   < > 149* 148* 147* 141 144  K 4.0   < > 3.8   < > 3.8   < > 4.2   < > 3.7 3.6 3.8 4.3 4.3  CL 102  --  105  --  103  --  116*  --  112*  --   --  101  --   CO2 25  --  24  --  24  --  24  --  25  --   --  32  --   GLUCOSE 146*  --  169*  --  252*  --  164*  --  165*  --   --  156*  --   BUN 10  --  10  --  19  --  37*  --  34*  --   --  26*  --   CREATININE 0.68  --  0.65  --  0.79  --  1.04  --  0.90  --   --  0.61  --   CALCIUM 7.8*  --  7.8*  --  8.1*  --  7.9*  --  8.0*  --   --  7.9*  --   MG 2.2  --  2.2  --  2.5*  --  2.8*  --  2.6*  --   --   --   --   PHOS 2.4*  --  3.1  --  2.3*  --  3.1  --  2.8  --   --   --   --    < > = values in this interval not displayed.   GFR: Estimated Creatinine Clearance: 105.7 mL/min (by C-G formula based on SCr of 0.61 mg/dL). Recent Labs  Lab 05/04/2019 1815 04/29/2019 2015  05/16/19 0455 05/17/19 0125 05/18/19 0100 05/19/19 0400 05/19/19 1040 05/20/19 0230  PROCALCITON 0.92  --   --   --   --   --   --  0.65 0.29  WBC 7.5  --    < > 8.7 9.4 13.6* 18.7*  --   --   LATICACIDVEN  --  1.5  --   --   --   --   --   --   --    < > = values in this interval not displayed.  Liver Function Tests: Recent Labs  Lab 05/16/19 0455 05/17/19 0125 05/18/19 0100 05/19/19 0400 05/20/19 0230  AST 235* 118* 65* 63* 53*  ALT 165* 136* 88* 79* 60*  ALKPHOS 160* 183* 186* 227* 150*  BILITOT 0.5 0.4 0.1* 0.1* 0.2*  PROT 7.0 7.1 6.5 7.4 6.0*  ALBUMIN 2.4* 2.6* 2.5* 3.1* 2.6*   No results for input(s): LIPASE, AMYLASE in the last 168 hours. No results for input(s): AMMONIA in the last 168 hours.  ABG    Component Value Date/Time   PHART 7.349 (L) 05/20/2019 0425   PCO2ART 62.1 (H) 05/20/2019 0425   PO2ART 64.0 (L) 05/20/2019 0425   HCO3 34.2 (H) 05/20/2019 0425   TCO2 36 (H) 05/20/2019 0425   ACIDBASEDEF 3.0  (H) 05/18/2019 1601   O2SAT 90.0 05/20/2019 0425     Coagulation Profile: No results for input(s): INR, PROTIME in the last 168 hours.  Cardiac Enzymes: Recent Labs  Lab 06/09/19 1815  Jun 09, 2019 2018 05/15/19 0450 05/15/19 0455 05/16/19 0455 05/17/19 0125 05/18/19 0100 05/19/19 0400 05/19/19 1040  CKTOTAL  --    < > 62  --  74 64 115 88 498*  --   CKMB  --   --  1.7  --   --   --   --   --   --   --   TROPONINI <0.03  --   --  <0.03  --   --   --   --   --  0.06*   < > = values in this interval not displayed.    HbA1C: Hemoglobin A1C  Date/Time Value Ref Range Status  06/19/2018 04:01 PM 9.1 (A) 4.0 - 5.6 % Final  01/02/2017 06:51 PM 12.8  Final    CBG: Recent Labs  Lab 05/20/19 0259 05/20/19 0403 05/20/19 0508 05/20/19 0602 05/20/19 0705  GLUCAP 164* 166* 143* 145* 150*     Critical care time: 35 minutes     Heber Olmsted, MD [place] Pager: [phone] Cell: [phone] If no response, call [phone]

## 2019-05-20 NOTE — Progress Notes (Signed)
Lower extremity duplex complete; result located under CV Proc.   Leta Jungling RDCS

## 2019-05-20 NOTE — Progress Notes (Signed)
  Echocardiogram 2D Echocardiogram has been performed.  Leta Jungling M 05/20/2019, 9:28 AM

## 2019-05-20 NOTE — Progress Notes (Signed)
PROGRESS NOTE  Evelyn Moch Hastings JYN:[id number] DOB: 1956/02/27 DOA: 04/21/2019 PCP: Patient, No Pcp Per   LOS: 6 days   Brief Narrative / Interim history: 63 year old male with history of diabetes, hyperlipidemia, was admitted to the hospital 05/04/2019 with shortness of breath, found to have hypoxic respiratory failure due to COVID-19 pneumonia.  He was admitted to the ICU requiring nonrebreather mask.  He received a dose of Actemra on 5/27 and was started on a course of Remdesivir.  He under went a CT angiogram on 5/27 with diffuse bilateral patchy airspace disease with groundglass appearance, no PE.  Subjective: -Remains intubated, is paralyzed this morning  Assessment & Plan: Principal Problem:   Acute respiratory failure with hypoxemia (HCC) Active Problems:   Type 2 diabetes mellitus without complication, without long-term current use of insulin (HCC)   Pure hypercholesterolemia   Pneumonia due to COVID-19 virus   CAP (community acquired pneumonia)   Principal Problem Acute Hypoxic Respiratory Failure due to Covid-19 Viral Illness/ARDS/septic shock -Intubated on 05/17/2019, vent support and sedation per critical care.  Dyssynchronous with the vent on 5/31 requiring paralytics.  Attempt to stop the paralytics today  Vent Mode: PCV FiO2 (%):  [40 %-60 %] 60 % Set Rate:  [28 bmp] 28 bmp PEEP:  [12 cmH20] 12 cmH20 Plateau Pressure:  [20 cmH20-26 cmH20] 20 cmH20 -COVID-19 specific treatment  Remdesivir started 5/27  Actemra on 5/27  Convalescent plasma 5/29 -Continue steroids -Patient was initially placed on cefepime and azithromycin, will continue for a 5-day course, today day 6.  Discussed with pharmacy -Developed shock with hypotension on 5/31 with increased pressor requirements, which is now better, possibly related to aggressive diuresis the day before.  2D echo pending -Significantly elevated d-dimer to greater than 20 on 5/30, started on heparin, lower extremity  Dopplers without DVT and preliminary 2D echo without RV strain so less likely PE or DVT.  Stop heparin and place patient on Lovenox  COVID-19 Labs  Recent Labs    05/18/19 0100 05/19/19 0400 05/19/19 0500 05/20/19 0230  DDIMER >20.00* 12.72*  --  5.03*  FERRITIN 1,162*  --  367* 627*  LDH 685* 1,091*  --  649*  CRP 7.5*  --  4.1* 1.6*    Lab Results  Component Value Date   SARSCOV2NAA DETECTED (A) 05/08/2019    Type 2 diabetes mellitus -Patient was initially requiring an insulin drip, will attempt to wean off today.  Started on 20 units of Lantus, placed on moderate sliding scale every 4  Elevated LFTs -Possibly related to Covid versus Actemra, peaked and now trending down.  We will continue to monitor.   Scheduled Meds: . artificial tears  1 application Both Eyes Q8H  . chlorhexidine  15 mL Mouth/Throat BID  . Chlorhexidine Gluconate Cloth  6 each Topical q morning - 10a  . enoxaparin (LOVENOX) injection  50 mg Subcutaneous Q12H  . famotidine  20 mg Oral BID  . free water  250 mL Per Tube Q4H  . guaiFENesin-dextromethorphan  15 mL Oral Q6H  . insulin glargine  20 Units Subcutaneous Daily  . mouth rinse  15 mL Mouth Rinse 10 times per day  . methylPREDNISolone (SOLU-MEDROL) injection  50 mg Intravenous Q12H  . multivitamin with minerals  1 tablet Per Tube Daily  . sodium chloride flush  10-40 mL Intracatheter Q12H   Continuous Infusions: . sodium chloride 10 mL/hr at 05/20/19 0700  . sodium chloride 100 mL/hr at 05/19/19 0400  . azithromycin Stopped (  05/19/19 2253)  . ceFEPime (MAXIPIME) IV Stopped (05/20/19 0545)  . feeding supplement (VITAL HIGH PROTEIN) 1,000 mL (05/20/19 0216)  . insulin 2.8 Units/hr (05/20/19 1126)  . midazolam 4 mg/hr (05/20/19 0700)  . morphine 5 mg/hr (05/20/19 0700)  . norepinephrine (LEVOPHED) Adult infusion Stopped (05/20/19 0101)  . vecuronium (NORCURON) infusion Stopped (05/20/19 0933)   PRN Meds:.sodium chloride, acetaminophen,  metoprolol tartrate, midazolam, morphine, ondansetron (ZOFRAN) IV, polyethylene glycol, polyvinyl alcohol, sodium chloride flush  DVT prophylaxis: Lovenox Code Status: Full code Family Communication: Granddaughter updated 6/1 Disposition Plan: TBD  Consultants:   Critical care  Procedures:   None   Antimicrobials:  Vancomycin 5/26 >> 5/28  Cefepime 5/26 >>  Azithromycin 5/26 >>   Objective: Vitals:   05/20/19 0600 05/20/19 0700 05/20/19 0745 05/20/19 1211  BP: 110/68 116/69 110/69   Pulse: (!) 115 (!) 114 (!) 110 (!) 106  Resp: (!) 28 (!) 28 (!) 28 (!) 28  Temp:      TempSrc:      SpO2: (!) 88% (!) 88% (!) 89% 92%  Weight:      Height:        Intake/Output Summary (Last 24 hours) at 05/20/2019 1255 Last data filed at 05/20/2019 0700 Gross per 24 hour  Intake 5327.2 ml  Output 2475 ml  Net 2852.2 ml   Filed Weights   05/15/2019 2354 05/20/19 0500  Weight: 105.5 kg 105.6 kg    Examination:  Constitutional: Intubated, sedated Eyes: No scleral icterus ENMT: Dry membranes Respiratory: Decreased breath sounds at the bases, no wheezing, no significant crackles Cardiovascular: Regular rate and rhythm, no edema Abdomen: Soft, positive bowel sounds Musculoskeletal: no clubbing / cyanosis.  Skin: No rashes seen Neurologic: Sedated   Data Reviewed: I have independently reviewed following labs and imaging studies   CBC: Recent Labs  Lab 05/15/19 0455  05/16/19 0455  05/17/19 0125  05/18/19 0100  05/18/19 1601 05/19/19 0400 05/19/19 1101 05/19/19 1448 05/20/19 0425  WBC 7.3  --  8.7  --  9.4  --  13.6*  --   --  18.7*  --   --   --   NEUTROABS 5.4  --  6.9  --  8.3*  --  12.2*  --   --  14.4*  --   --   --   HGB 13.2   < > 14.3   < > 14.8   < > 12.3*   < > 13.6 14.5 13.9 12.9* 12.9*  HCT 39.6   < > 41.7   < > 44.8   < > 37.8*   < > 40.0 44.0 41.0 38.0* 38.0*  MCV 91.7  --  92.1  --  91.8  --  95.0  --   --  95.2  --   --   --   PLT 245  --  312  --  316  --   230  --   --  361  --   --   --    < > = values in this interval not displayed.   Basic Metabolic Panel: Recent Labs  Lab 05/15/19 0455  05/16/19 0455  05/17/19 0125  05/18/19 0100  05/19/19 0400 05/19/19 1101 05/19/19 1448 05/20/19 0230 05/20/19 0425  NA 136   < > 139   < > 141   < > 150*   < > 149* 148* 147* 141 144  K 4.0   < > 3.8   < > 3.8   < >  4.2   < > 3.7 3.6 3.8 4.3 4.3  CL 102  --  105  --  103  --  116*  --  112*  --   --  101  --   CO2 25  --  24  --  24  --  24  --  25  --   --  32  --   GLUCOSE 146*  --  169*  --  252*  --  164*  --  165*  --   --  156*  --   BUN 10  --  10  --  19  --  37*  --  34*  --   --  26*  --   CREATININE 0.68  --  0.65  --  0.79  --  1.04  --  0.90  --   --  0.61  --   CALCIUM 7.8*  --  7.8*  --  8.1*  --  7.9*  --  8.0*  --   --  7.9*  --   MG 2.2  --  2.2  --  2.5*  --  2.8*  --  2.6*  --   --   --   --   PHOS 2.4*  --  3.1  --  2.3*  --  3.1  --  2.8  --   --   --   --    < > = values in this interval not displayed.   GFR: Estimated Creatinine Clearance: 105.7 mL/min (by C-G formula based on SCr of 0.61 mg/dL). Liver Function Tests: Recent Labs  Lab 05/16/19 0455 05/17/19 0125 05/18/19 0100 05/19/19 0400 05/20/19 0230  AST 235* 118* 65* 63* 53*  ALT 165* 136* 88* 79* 60*  ALKPHOS 160* 183* 186* 227* 150*  BILITOT 0.5 0.4 0.1* 0.1* 0.2*  PROT 7.0 7.1 6.5 7.4 6.0*  ALBUMIN 2.4* 2.6* 2.5* 3.1* 2.6*   No results for input(s): LIPASE, AMYLASE in the last 168 hours. No results for input(s): AMMONIA in the last 168 hours. Coagulation Profile: No results for input(s): INR, PROTIME in the last 168 hours. Cardiac Enzymes: Recent Labs  Lab 2019/06/03 1815  06/03/19 2018 05/15/19 0450 05/15/19 0455 05/16/19 0455 05/17/19 0125 05/18/19 0100 05/19/19 0400 05/19/19 1040  CKTOTAL  --    < > 62  --  74 64 115 88 498*  --   CKMB  --   --  1.7  --   --   --   --   --   --   --   TROPONINI <0.03  --   --  <0.03  --   --   --   --    --  0.06*   < > = values in this interval not displayed.   BNP (last 3 results) No results for input(s): PROBNP in the last 8760 hours. HbA1C: No results for input(s): HGBA1C in the last 72 hours. CBG: Recent Labs  Lab 05/20/19 0705 05/20/19 0804 05/20/19 0911 05/20/19 1015 05/20/19 1123  GLUCAP 150* 133* 128* 142* 146*   Lipid Profile: Recent Labs    05/18/19 0100 05/19/19 0400  TRIG 210* 192*   Thyroid Function Tests: No results for input(s): TSH, T4TOTAL, FREET4, T3FREE, THYROIDAB in the last 72 hours. Anemia Panel: Recent Labs    05/19/19 0500 05/20/19 0230  FERRITIN 367* 627*   Urine analysis:    Component Value Date/Time   COLORURINE YELLOW 05/28/2016 2056  APPEARANCEUR CLEAR 05/28/2016 2056   LABSPEC 1.041 (H) 05/28/2016 2056   PHURINE 5.0 05/28/2016 2056   GLUCOSEU >1000 (A) 05/28/2016 2056   HGBUR NEGATIVE 05/28/2016 2056   BILIRUBINUR negative 06/19/2018 1601   KETONESUR negative 06/19/2018 1601   KETONESUR 15 (A) 05/28/2016 2056   PROTEINUR negative 06/19/2018 1601   PROTEINUR NEGATIVE 05/28/2016 2056   UROBILINOGEN 0.2 06/19/2018 1601   NITRITE Negative 06/19/2018 1601   NITRITE NEGATIVE 05/28/2016 2056   LEUKOCYTESUR Negative 06/19/2018 1601   Sepsis Labs: Invalid input(s): PROCALCITONIN, LACTICIDVEN  Recent Results (from the past 240 hour(s))  Culture, blood (Routine X 2) w Reflex to ID Panel     Status: None   Collection Time: 05/19/19  8:18 PM  Result Value Ref Range Status   Specimen Description   Final    BLOOD BLOOD LEFT FOREARM Performed at [facility], [address], [place], [place] [address]    Special Requests   Final    BOTTLES DRAWN AEROBIC AND ANAEROBIC Blood Culture adequate volume Performed at [facility], [address] [place], [place] [address]    Culture   Final    NO GROWTH 5 DAYS Performed at [facility], [address] [place], [place] [address]    Report  Status 05/19/2019 FINAL  Final  MRSA PCR Screening     Status: None   Collection Time: 05/15/19  1:55 PM  Result Value Ref Range Status   MRSA by PCR NEGATIVE NEGATIVE Final    Comment:        The GeneXpert MRSA Assay (FDA approved for NASAL specimens only), is one component of a comprehensive MRSA colonization surveillance program. It is not intended to diagnose MRSA infection nor to guide or monitor treatment for MRSA infections. Performed at [facility], [address], [place], [place] [address]   Culture, respiratory (non-expectorated)     Status: None (Preliminary result)   Collection Time: 05/19/19  9:44 AM  Result Value Ref Range Status   Specimen Description   Final    TRACHEAL ASPIRATE Performed at [facility], [address] [place], [place] [address]    Special Requests   Final    NONE Performed at [facility], [address], [place], [place] [address]    Gram Stain   Final    RARE WBC PRESENT, PREDOMINANTLY PMN RARE GRAM POSITIVE COCCI    Culture   Final    CULTURE REINCUBATED FOR BETTER GROWTH Performed at [facility], [address], [place], [place] [address]    Report Status PENDING  Incomplete  Culture, blood (Routine X 2) w Reflex to ID Panel     Status: None (Preliminary result)   Collection Time: 05/19/19  9:44 AM  Result Value Ref Range Status   Specimen Description   Final    RIGHT ANTECUBITAL Performed at [facility], [address], [place], [place] [address]    Special Requests   Final    BOTTLES DRAWN AEROBIC ONLY Blood Culture adequate volume Performed at [facility], [address] [place], [place] [address]    Culture   Final    NO GROWTH < 24 HOURS Performed at [facility], [address] [place], [place] [address]    Report Status PENDING  Incomplete  Culture, blood (Routine X 2) w Reflex to ID Panel     Status: None  (Preliminary  result)   Collection Time: 05/19/19  9:49 AM  Result Value Ref Range Status   Specimen Description   Final    BLOOD RIGHT HAND Performed at [facility], [address] [place], [place] [address]    Special Requests   Final    BOTTLES DRAWN AEROBIC ONLY Blood Culture adequate volume Performed at [facility], [address] [place], [place] [address]    Culture   Final    NO GROWTH < 24 HOURS Performed at [facility], [address] [place], [place] [address]    Report Status PENDING  Incomplete      Radiology Studies: Dg Chest Port 1 View  Result Date: 05/19/2019 CLINICAL DATA:  Acute respiratory failure.  Ventilator support. EXAM: PORTABLE CHEST 1 VIEW COMPARISON:  05/18/2019 FINDINGS: Endotracheal tube tip is 4 cm above the carina. Orogastric or nasogastric tube enters the stomach. Left subclavian central line tip in the SVC above the right atrium. Bilateral patchy pulmonary infiltrates appear similar. No worsening or new finding. Extensive calcified pleural plaques as noted previously. IMPRESSION: Lines and tubes well positioned. Persistent bilateral patchy pulmonary infiltrates without new finding. Electronically Signed   By: Paulina Fusi M.D.   On: 05/19/2019 13:07   Vas [place] Lower Extremity Venous (dvt)  Result Date: 05/20/2019  Lower Venous Study Indications: Swelling, and elevated d-dimer, fever, COVID 19.  Performing Technologist: Leta Jungling RDCS  Examination Guidelines: A complete evaluation includes B-mode imaging, spectral Doppler, color Doppler, and power Doppler as needed of all accessible portions of each vessel. Bilateral testing is considered an integral part of a complete examination. Limited examinations for reoccurring indications may be performed as noted.  +---------+---------------+---------+-----------+----------+--------------+ RIGHT    CompressibilityPhasicitySpontaneityPropertiesSummary         +---------+---------------+---------+-----------+----------+--------------+ CFV      Full           Yes      Yes                                 +---------+---------------+---------+-----------+----------+--------------+ SFJ      Full                                                        +---------+---------------+---------+-----------+----------+--------------+ FV Prox  Full                                                        +---------+---------------+---------+-----------+----------+--------------+ FV Mid   Full                                                        +---------+---------------+---------+-----------+----------+--------------+ FV DistalFull                                                        +---------+---------------+---------+-----------+----------+--------------+  POP      Full           Yes      Yes                                 +---------+---------------+---------+-----------+----------+--------------+ PTV                                                   Not visualized +---------+---------------+---------+-----------+----------+--------------+   +---------+---------------+---------+-----------+----------+--------------+ LEFT     CompressibilityPhasicitySpontaneityPropertiesSummary        +---------+---------------+---------+-----------+----------+--------------+ CFV      Full           Yes      Yes                                 +---------+---------------+---------+-----------+----------+--------------+ SFJ      Full                                                        +---------+---------------+---------+-----------+----------+--------------+ FV Prox  Full                                                        +---------+---------------+---------+-----------+----------+--------------+ FV Mid   Full                                                         +---------+---------------+---------+-----------+----------+--------------+ FV DistalFull                                                        +---------+---------------+---------+-----------+----------+--------------+ POP      Full           Yes      Yes                                 +---------+---------------+---------+-----------+----------+--------------+ PTV                                                   Not visualized +---------+---------------+---------+-----------+----------+--------------+     Summary: Right: No evidence of deep vein thrombosis in the lower extremity. No indirect evidence of obstruction proximal to the inguinal ligament. No cystic structure found in the popliteal fossa. Left: No evidence of deep vein thrombosis in the lower extremity. No indirect evidence of obstruction proximal to the inguinal ligament. No cystic structure found in the  popliteal fossa.  *See table(s) above for measurements and observations.    Preliminary     Pamella Pertostin Dorothymae Maciver, MD, PhD [facility]  Contact via  [email or web address]  [facility] Office Info P: [phone]  F: [phone]

## 2019-05-20 NOTE — Progress Notes (Signed)
eLink Physician-Brief Progress Note Patient Name: Lance Mason DOB: 01-Aug-1956 MRN: [id number]   Date of Service  05/20/2019  HPI/Events of Note  Nursing reports loss of R PT and DP pulses and L DP pulse. Currently on Lovenox [place].   eICU Interventions  Will order: 1. D/C Lovenox. 2. Heparin per pharmacy consult. 3. Will notify ground team of findings.      Intervention Category Major Interventions: Other:  Maurisha Mongeau Dennard Nip 05/20/2019, 10:02 PM

## 2019-05-20 NOTE — Progress Notes (Signed)
PCCM Progress note  Called to evaluate loss of pules in PT and DP  Lower legs and feet are cool but has good color and cap refill I was able to elicit a weak pulse in posterior tibal in both legs with ultrasound doppler  Don't feel he needs full AC with heparin at this point. Will DC heparin drip order and resume lovenox 50 mg bid Continue to monitor.  The patient is critically ill with multiple organ system failure and requires high complexity decision making for assessment and support, frequent evaluation and titration of therapies, advanced monitoring, review of radiographic studies and interpretation of complex data.   Critical Care Time devoted to patient care services, exclusive of separately billable procedures, described in this note is 35 minutes.   Chilton Greathouse MD [facility] Pager [phone] If no answer call [phone] 05/20/2019, 11:19 PM

## 2019-05-20 NOTE — Progress Notes (Signed)
Per Dr. Elvera Lennox verbal order insulin drip has been stopped and he will order sliding scale for patient.

## 2019-05-20 DEATH — deceased

## 2019-05-21 ENCOUNTER — Other Ambulatory Visit: Payer: Self-pay

## 2019-05-21 DIAGNOSIS — L8993 Pressure ulcer of unspecified site, stage 3: Secondary | ICD-10-CM

## 2019-05-21 LAB — COMPREHENSIVE METABOLIC PANEL
ALT: 57 U/L — ABNORMAL HIGH (ref 0–44)
AST: 44 U/L — ABNORMAL HIGH (ref 15–41)
Albumin: 2.5 g/dL — ABNORMAL LOW (ref 3.5–5.0)
Alkaline Phosphatase: 130 U/L — ABNORMAL HIGH (ref 38–126)
Anion gap: 11 (ref 5–15)
BUN: 35 mg/dL — ABNORMAL HIGH (ref 8–23)
CO2: 30 mmol/L (ref 22–32)
Calcium: 8.3 mg/dL — ABNORMAL LOW (ref 8.9–10.3)
Chloride: 97 mmol/L — ABNORMAL LOW (ref 98–111)
Creatinine, Ser: 0.66 mg/dL (ref 0.61–1.24)
GFR calc Af Amer: >60 mL/min (ref >60)
GFR calc non Af Amer: >60 mL/min (ref >60)
Glucose, Bld: 245 mg/dL — ABNORMAL HIGH (ref 70–99)
Potassium: 4.4 mmol/L (ref 3.5–5.1)
Sodium: 138 mmol/L (ref 135–145)
Total Bilirubin: 0.4 mg/dL (ref 0.3–1.2)
Total Protein: 5.8 g/dL — ABNORMAL LOW (ref 6.5–8.1)

## 2019-05-21 LAB — CBC
HCT: 39.2 % (ref 39.0–52.0)
Hemoglobin: 12.8 g/dL — ABNORMAL LOW (ref 13.0–17.0)
MCH: 31.1 pg (ref 26.0–34.0)
MCHC: 32.7 g/dL (ref 30.0–36.0)
MCV: 95.4 fL (ref 80.0–100.0)
Platelets: 220 10*3/uL (ref 150–400)
RBC: 4.11 MIL/uL — ABNORMAL LOW (ref 4.22–5.81)
RDW: 13.2 % (ref 11.5–15.5)
WBC: 12.3 10*3/uL — ABNORMAL HIGH (ref 4.0–10.5)
nRBC: 0.3 % — ABNORMAL HIGH (ref 0.0–0.2)

## 2019-05-21 LAB — CULTURE, RESPIRATORY W GRAM STAIN: Culture: NORMAL

## 2019-05-21 LAB — C-REACTIVE PROTEIN
CRP: 0.8 mg/dL (ref <1.0)
CRP: 0.9 mg/dL (ref <1.0)

## 2019-05-21 LAB — D-DIMER, QUANTITATIVE: D-Dimer, Quant: 5.36 ug/mL-FEU — ABNORMAL HIGH (ref 0.00–0.50)

## 2019-05-21 LAB — GLUCOSE, CAPILLARY
Glucose-Capillary: 249 mg/dL — ABNORMAL HIGH (ref 70–99)
Glucose-Capillary: 230 mg/dL — ABNORMAL HIGH (ref 70–99)
Glucose-Capillary: 273 mg/dL — ABNORMAL HIGH (ref 70–99)
Glucose-Capillary: 310 mg/dL — ABNORMAL HIGH (ref 70–99)
Glucose-Capillary: 267 mg/dL — ABNORMAL HIGH (ref 70–99)
Glucose-Capillary: 336 mg/dL — ABNORMAL HIGH (ref 70–99)

## 2019-05-21 LAB — FERRITIN
Ferritin: 1086 ng/mL — ABNORMAL HIGH (ref 24–336)
Ferritin: 970 ng/mL — ABNORMAL HIGH (ref 24–336)

## 2019-05-21 LAB — PROCALCITONIN: Procalcitonin: 0.17 ng/mL

## 2019-05-21 LAB — LACTATE DEHYDROGENASE: LDH: 547 U/L — ABNORMAL HIGH (ref 98–192)

## 2019-05-21 MED ORDER — INSULIN ASPART 100 UNIT/ML ~~LOC~~ SOLN
0.0000 [IU] | SUBCUTANEOUS | Status: DC
  Administered 2019-05-21: 20:00:00 11 [IU] via SUBCUTANEOUS
  Administered 2019-05-21: 15 [IU] via SUBCUTANEOUS
  Administered 2019-05-22: 20 [IU] via SUBCUTANEOUS
  Administered 2019-05-22: 11 [IU] via SUBCUTANEOUS
  Administered 2019-05-22: 15 [IU] via SUBCUTANEOUS
  Administered 2019-05-22: 11 [IU] via SUBCUTANEOUS
  Administered 2019-05-22: 4 [IU] via SUBCUTANEOUS
  Administered 2019-05-23: 11 [IU] via SUBCUTANEOUS
  Administered 2019-05-23: 15 [IU] via SUBCUTANEOUS
  Administered 2019-05-23: 11 [IU] via SUBCUTANEOUS
  Administered 2019-05-23: 3 [IU] via SUBCUTANEOUS
  Administered 2019-05-23: 4 [IU] via SUBCUTANEOUS
  Administered 2019-05-23: 11 [IU] via SUBCUTANEOUS
  Administered 2019-05-23 – 2019-05-24 (×3): 15 [IU] via SUBCUTANEOUS
  Administered 2019-05-24: 4 [IU] via SUBCUTANEOUS
  Administered 2019-05-24: 11 [IU] via SUBCUTANEOUS
  Administered 2019-05-25: 15 [IU] via SUBCUTANEOUS
  Administered 2019-05-25 (×2): 4 [IU] via SUBCUTANEOUS
  Administered 2019-05-25: 15 [IU] via SUBCUTANEOUS
  Administered 2019-05-25: 3 [IU] via SUBCUTANEOUS
  Administered 2019-05-25 – 2019-05-26 (×3): 4 [IU] via SUBCUTANEOUS
  Administered 2019-05-26: 11 [IU] via SUBCUTANEOUS
  Administered 2019-05-26: 4 [IU] via SUBCUTANEOUS
  Administered 2019-05-26: 3 [IU] via SUBCUTANEOUS
  Administered 2019-05-27: 4 [IU] via SUBCUTANEOUS
  Administered 2019-05-27: 3 [IU] via SUBCUTANEOUS
  Administered 2019-05-27 – 2019-05-28 (×3): 4 [IU] via SUBCUTANEOUS
  Administered 2019-05-28 – 2019-05-29 (×3): 3 [IU] via SUBCUTANEOUS

## 2019-05-21 MED ORDER — FLUCONAZOLE 100MG IVPB
100.0000 mg | INTRAVENOUS | Status: AC
  Administered 2019-05-22 – 2019-05-26 (×6): 100 mg via INTRAVENOUS
  Filled 2019-05-21 (×5): qty 50

## 2019-05-21 MED ORDER — FLUCONAZOLE IN SODIUM CHLORIDE 200-0.9 MG/100ML-% IV SOLN
200.0000 mg | Freq: Once | INTRAVENOUS | Status: AC
  Administered 2019-05-21: 17:00:00 200 mg via INTRAVENOUS
  Filled 2019-05-21: qty 100

## 2019-05-21 MED ORDER — MORPHINE 100MG IN NS 100ML (1MG/ML) PREMIX INFUSION
1.0000 mg/h | INTRAVENOUS | Status: DC
  Administered 2019-05-21 – 2019-05-23 (×4): 4 mg/h via INTRAVENOUS
  Administered 2019-05-24: 10 mg/h via INTRAVENOUS
  Administered 2019-05-24: 03:00:00 9 mg/h via INTRAVENOUS
  Administered 2019-05-25: 16:00:00 6 mg/h via INTRAVENOUS
  Administered 2019-05-25: 10 mg/h via INTRAVENOUS
  Administered 2019-05-26 (×2): 6 mg/h via INTRAVENOUS
  Filled 2019-05-21 (×10): qty 100

## 2019-05-21 MED ORDER — MIDAZOLAM BOLUS VIA INFUSION
1.0000 mg | INTRAVENOUS | Status: DC | PRN
  Administered 2019-05-22 – 2019-05-24 (×3): 2 mg via INTRAVENOUS
  Administered 2019-05-25: 1 mg via INTRAVENOUS
  Filled 2019-05-21: qty 2

## 2019-05-21 MED ORDER — MIDAZOLAM 50MG/50ML (1MG/ML) PREMIX INFUSION
0.0000 mg/h | INTRAVENOUS | Status: DC
  Administered 2019-05-21 (×3): 4 mg/h via INTRAVENOUS
  Administered 2019-05-22: 6 mg/h via INTRAVENOUS
  Administered 2019-05-22: 4 mg/h via INTRAVENOUS
  Administered 2019-05-23: 5 mg/h via INTRAVENOUS
  Administered 2019-05-23: 4 mg/h via INTRAVENOUS
  Administered 2019-05-24: 1 mg/h via INTRAVENOUS
  Filled 2019-05-21 (×7): qty 50

## 2019-05-21 MED ORDER — MORPHINE BOLUS VIA INFUSION
1.0000 mg | INTRAVENOUS | Status: DC | PRN
  Administered 2019-05-23 – 2019-05-27 (×5): 1 mg via INTRAVENOUS
  Filled 2019-05-21: qty 1

## 2019-05-21 MED ORDER — INSULIN ASPART 100 UNIT/ML ~~LOC~~ SOLN
3.0000 [IU] | SUBCUTANEOUS | Status: DC
  Administered 2019-05-21 – 2019-05-23 (×13): 3 [IU] via SUBCUTANEOUS

## 2019-05-21 MED ORDER — CHLORHEXIDINE GLUCONATE 0.12 % MT SOLN
15.0000 mL | Freq: Two times a day (BID) | OROMUCOSAL | Status: DC
  Administered 2019-05-21 – 2019-05-29 (×16): 15 mL via OROMUCOSAL
  Filled 2019-05-21 (×11): qty 15

## 2019-05-21 MED ORDER — FUROSEMIDE 10 MG/ML IJ SOLN
60.0000 mg | Freq: Two times a day (BID) | INTRAMUSCULAR | Status: AC
  Administered 2019-05-21 (×2): 60 mg via INTRAVENOUS
  Filled 2019-05-21 (×2): qty 6

## 2019-05-21 MED ORDER — MORPHINE SULFATE (PF) 2 MG/ML IV SOLN
2.0000 mg | Freq: Once | INTRAVENOUS | Status: DC
  Filled 2019-05-21: qty 1

## 2019-05-21 NOTE — Progress Notes (Addendum)
Attempted to call granddaughter Steward Drone at [phone] and there was no answer. Voicemail box is not set up to leave a VM. Will try again later today.   Update: granddaughter called and was given an update

## 2019-05-21 NOTE — Progress Notes (Signed)
PROGRESS NOTE  Lance [place] ZOX:[id number] DOB: 1956-02-20 DOA: 2019/05/20 PCP: Patient, No Pcp Per   LOS: 7 days   Brief Narrative / Interim history: 63 year old male with history of diabetes, hyperlipidemia, was admitted to the hospital 2019/05/20 with shortness of breath, found to have hypoxic respiratory failure due to COVID-19 pneumonia.  He was admitted to the ICU requiring nonrebreather mask.  He received a dose of Actemra on 5/27 and was started on a course of Remdesivir.  He under went a CT angiogram on 5/27 with diffuse bilateral patchy airspace disease with groundglass appearance, no PE.  Subjective: -concern overnight about loss of pulses in PT and DP, he was briefly on heparin drip  Assessment & Plan: Principal Problem:   Acute respiratory failure with hypoxemia (HCC) Active Problems:   Type 2 diabetes mellitus without complication, without long-term current use of insulin (HCC)   Pure hypercholesterolemia   Pneumonia due to COVID-19 virus   CAP (community acquired pneumonia)   Abnormal liver function   Principal Problem Acute Hypoxic Respiratory Failure due to Covid-19 Viral Illness/ARDS/septic shock -Intubated on 05/17/2019, vent support and sedation per critical care.   Vent Mode: PCV FiO2 (%):  [40 %-60 %] 60 % Set Rate:  [28 bmp] 28 bmp PEEP:  [10 cmH20-12 cmH20] 10 cmH20 Plateau Pressure:  [20 cmH20-26 cmH20] 24 cmH20 -COVID-19 specific treatment  Remdesivir started 5/27  Actemra on 5/27  Convalescent plasma 5/29 -on steroids -Patient was initially placed on cefepime and azithromycin, now finished a 7-day course on 6/2 -Patient received aggressive diuresis on 5/31 with resultant hypotension, this is now resolved.  Diurese again today -Significantly elevated d-dimer to greater than 20 on 5/30, started on heparin, lower extremity Dopplers without DVT and preliminary 2D echo without RV strain so less likely PE or DVT.  Heparin was stopped  COVID-19  Labs  Recent Labs    05/19/19 0400 05/19/19 0500 05/20/19 0230 05/21/19 0215  DDIMER 12.72*  --  5.03* 5.36*  FERRITIN  --  367* 627* 1,086*   970*  LDH 1,091*  --  649* 547*  CRP  --  4.1* 1.6* 0.9   0.8    Lab Results  Component Value Date   SARSCOV2NAA DETECTED (A) 05/08/2019    Type 2 diabetes mellitus -initially on insulin gtt after intubation, weaned off -hyperglycemic last night, add 3U Novolog Q4 in addition to his SSI  Elevated LFTs -Possibly related to Covid versus Actemra, peaked and now trending down.  We will continue to monitor.   Scheduled Meds:  chlorhexidine  15 mL Mouth/Throat BID   Chlorhexidine Gluconate Cloth  6 each Topical q morning - 10a   enoxaparin (LOVENOX) injection  50 mg Subcutaneous Q12H   famotidine  20 mg Oral BID   free water  250 mL Per Tube Q4H   guaiFENesin-dextromethorphan  15 mL Oral Q6H   insulin aspart  0-15 Units Subcutaneous Q4H   insulin aspart  0-5 Units Subcutaneous QHS   insulin aspart  3 Units Subcutaneous Q4H   insulin glargine  20 Units Subcutaneous Daily   mouth rinse  15 mL Mouth Rinse 10 times per day   methylPREDNISolone (SOLU-MEDROL) injection  50 mg Intravenous Q12H   multivitamin with minerals  1 tablet Per Tube Daily   sodium chloride flush  10-40 mL Intracatheter Q12H   Continuous Infusions:  sodium chloride 10 mL/hr at 05/21/19 0700   sodium chloride 100 mL/hr at 05/19/19 0400   azithromycin Stopped (05/20/19 2243)  ceFEPime (MAXIPIME) IV Stopped (05/21/19 1610)   feeding supplement (VITAL HIGH PROTEIN) 1,000 mL (05/20/19 2150)   midazolam 3 mg/hr (05/21/19 0700)   morphine 4 mg/hr (05/21/19 0700)   norepinephrine (LEVOPHED) Adult infusion Stopped (05/20/19 0101)   vecuronium (NORCURON) infusion Stopped (05/20/19 0933)   PRN Meds:.sodium chloride, acetaminophen, metoprolol tartrate, midazolam, morphine, ondansetron (ZOFRAN) IV, polyethylene glycol, polyvinyl alcohol, sodium  chloride flush  DVT prophylaxis: Lovenox Code Status: Full code Family Communication: Granddaughter updated 6/1 Disposition Plan: TBD  Consultants:   Critical care  Procedures:   2D echo IMPRESSIONS  1. The left ventricle has hyperdynamic systolic function, with an ejection fraction of >65%. The cavity size was normal. Left ventricular diastolic Doppler parameters are indeterminate.  2. Image quality limits the assessment of regional wall motion. Grossly normal LV function.  3. The right ventricle has normal systolic function. The cavity was normal. Right ventricular systolic pressure could not be assessed.  4. Though morphology of aortic valve is not well seen, leaflet excursion appears normal.  5. No definite pericardial effusion.  Antimicrobials:  Vancomycin 5/26 >> 5/28  Cefepime 5/26 >> 6/2  Azithromycin 5/26 >> 6/2  Objective: Vitals:   05/21/19 0400 05/21/19 0510 05/21/19 0600 05/21/19 0700  BP: 115/67 105/66 102/63 (!) 95/53  Pulse: 88 84 80 83  Resp: (!) 26 (!) 31 (!) 25 18  Temp:      TempSrc:      SpO2: 95% 96% 95% 98%  Weight:      Height:        Intake/Output Summary (Last 24 hours) at 05/21/2019 0710 Last data filed at 05/21/2019 0700 Gross per 24 hour  Intake 4043.04 ml  Output 2190 ml  Net 1853.04 ml   Filed Weights   05/18/2019 2354 05/20/19 0500  Weight: 105.5 kg 105.6 kg    Examination:  Constitutional: Intubated, sedated, comfortable ENMT: Dry mucous membranes Respiratory: Decreased breath sounds at the bases, no wheezing, no crackles Cardiovascular: Regular rate and rhythm, no peripheral edema Abdomen: Soft, mildly distended, positive bowel sounds Musculoskeletal: No clubbing or cyanosis Skin: No rashes seen Neurologic: Sedated   Data Reviewed: I have independently reviewed following labs and imaging studies   CBC: Recent Labs  Lab 05/15/19 0455  05/16/19 0455  05/17/19 0125  05/18/19 0100  05/19/19 0400 05/19/19 1101  05/19/19 1448 05/20/19 0425 05/21/19 0215  WBC 7.3  --  8.7  --  9.4  --  13.6*  --  18.7*  --   --   --  12.3*  NEUTROABS 5.4  --  6.9  --  8.3*  --  12.2*  --  14.4*  --   --   --   --   HGB 13.2   < > 14.3   < > 14.8   < > 12.3*   < > 14.5 13.9 12.9* 12.9* 12.8*  HCT 39.6   < > 41.7   < > 44.8   < > 37.8*   < > 44.0 41.0 38.0* 38.0* 39.2  MCV 91.7  --  92.1  --  91.8  --  95.0  --  95.2  --   --   --  95.4  PLT 245  --  312  --  316  --  230  --  361  --   --   --  220   < > = values in this interval not displayed.   Basic Metabolic Panel: Recent Labs  Lab 05/15/19 0455  05/16/19 0455  05/17/19 0125  05/18/19 0100  05/19/19 0400 05/19/19 1101 05/19/19 1448 05/20/19 0230 05/20/19 0425 05/21/19 0215  NA 136   < > 139   < > 141   < > 150*   < > 149* 148* 147* 141 144 138  K 4.0   < > 3.8   < > 3.8   < > 4.2   < > 3.7 3.6 3.8 4.3 4.3 4.4  CL 102  --  105  --  103  --  116*  --  112*  --   --  101  --  97*  CO2 25  --  24  --  24  --  24  --  25  --   --  32  --  30  GLUCOSE 146*  --  169*  --  252*  --  164*  --  165*  --   --  156*  --  245*  BUN 10  --  10  --  19  --  37*  --  34*  --   --  26*  --  35*  CREATININE 0.68  --  0.65  --  0.79  --  1.04  --  0.90  --   --  0.61  --  0.66  CALCIUM 7.8*  --  7.8*  --  8.1*  --  7.9*  --  8.0*  --   --  7.9*  --  8.3*  MG 2.2  --  2.2  --  2.5*  --  2.8*  --  2.6*  --   --   --   --   --   PHOS 2.4*  --  3.1  --  2.3*  --  3.1  --  2.8  --   --   --   --   --    < > = values in this interval not displayed.   GFR: Estimated Creatinine Clearance: 105.7 mL/min (by C-G formula based on SCr of 0.66 mg/dL). Liver Function Tests: Recent Labs  Lab 05/17/19 0125 05/18/19 0100 05/19/19 0400 05/20/19 0230 05/21/19 0215  AST 118* 65* 63* 53* 44*  ALT 136* 88* 79* 60* 57*  ALKPHOS 183* 186* 227* 150* 130*  BILITOT 0.4 0.1* 0.1* 0.2* 0.4  PROT 7.1 6.5 7.4 6.0* 5.8*  ALBUMIN 2.6* 2.5* 3.1* 2.6* 2.5*   No results for input(s):  LIPASE, AMYLASE in the last 168 hours. No results for input(s): AMMONIA in the last 168 hours. Coagulation Profile: No results for input(s): INR, PROTIME in the last 168 hours. Cardiac Enzymes: Recent Labs  Lab 04/19/2019 1815  05/01/2019 2018 05/15/19 0450 05/15/19 0455 05/16/19 0455 05/17/19 0125 05/18/19 0100 05/19/19 0400 05/19/19 1040  CKTOTAL  --    < > 62  --  74 64 115 88 498*  --   CKMB  --   --  1.7  --   --   --   --   --   --   --   TROPONINI <0.03  --   --  <0.03  --   --   --   --   --  0.06*   < > = values in this interval not displayed.   BNP (last 3 results) No results for input(s): PROBNP in the last 8760 hours. HbA1C: No results for input(s): HGBA1C in the last 72 hours. CBG: Recent Labs  Lab 05/20/19 1255 05/20/19 1601 05/20/19 2010  05/20/19 2345 05/21/19 0315  GLUCAP 189* 305* 339* 292* 249*   Lipid Profile: Recent Labs    05/19/19 0400  TRIG 192*   Thyroid Function Tests: No results for input(s): TSH, T4TOTAL, FREET4, T3FREE, THYROIDAB in the last 72 hours. Anemia Panel: Recent Labs    05/20/19 0230 05/21/19 0215  FERRITIN 627* 1,086*   970*   Urine analysis:    Component Value Date/Time   COLORURINE YELLOW 05/28/2016 2056   APPEARANCEUR CLEAR 05/28/2016 2056   LABSPEC 1.041 (H) 05/28/2016 2056   PHURINE 5.0 05/28/2016 2056   GLUCOSEU >1000 (A) 05/28/2016 2056   HGBUR NEGATIVE 05/28/2016 2056   BILIRUBINUR negative 06/19/2018 1601   KETONESUR negative 06/19/2018 1601   KETONESUR 15 (A) 05/28/2016 2056   PROTEINUR negative 06/19/2018 1601   PROTEINUR NEGATIVE 05/28/2016 2056   UROBILINOGEN 0.2 06/19/2018 1601   NITRITE Negative 06/19/2018 1601   NITRITE NEGATIVE 05/28/2016 2056   LEUKOCYTESUR Negative 06/19/2018 1601   Sepsis Labs: Invalid input(s): PROCALCITONIN, LACTICIDVEN  Recent Results (from the past 240 hour(s))  Culture, blood (Routine X 2) w Reflex to ID Panel     Status: None   Collection Time: 04/09/19  8:18 PM   Result Value Ref Range Status   Specimen Description   Final    BLOOD BLOOD LEFT FOREARM Performed at [facility], [address] [place], [place] [address]    Special Requests   Final    BOTTLES DRAWN AEROBIC AND ANAEROBIC Blood Culture adequate volume Performed at [facility], [address] [place], [place] [address]    Culture   Final    NO GROWTH 5 DAYS Performed at [facility], [address] [place], [place] [address]    Report Status 05/19/2019 FINAL  Final  MRSA PCR Screening     Status: None   Collection Time: 05/15/19  1:55 PM  Result Value Ref Range Status   MRSA by PCR NEGATIVE NEGATIVE Final    Comment:        The GeneXpert MRSA Assay (FDA approved for NASAL specimens only), is one component of a comprehensive MRSA colonization surveillance program. It is not intended to diagnose MRSA infection nor to guide or monitor treatment for MRSA infections. Performed at [facility], [address] [place], [place] [address]   Culture, respiratory (non-expectorated)     Status: None (Preliminary result)   Collection Time: 05/19/19  9:44 AM  Result Value Ref Range Status   Specimen Description   Final    TRACHEAL ASPIRATE Performed at [facility], [address] [place], [place] [address]    Special Requests   Final    NONE Performed at [facility], [address] [place], [place] [address]    Gram Stain   Final    RARE WBC PRESENT, PREDOMINANTLY PMN RARE GRAM POSITIVE COCCI    Culture   Final    CULTURE REINCUBATED FOR BETTER GROWTH Performed at [facility], [address] [place], [place] [address]    Report Status PENDING  Incomplete  Culture, blood (Routine X 2) w Reflex to ID Panel     Status: None (Preliminary result)   Collection Time: 05/19/19  9:44 AM  Result Value Ref Range Status   Specimen Description   Final    RIGHT  ANTECUBITAL Performed at [facility], [address] [place], [place] [address]    Special Requests  Final    BOTTLES DRAWN AEROBIC ONLY Blood Culture adequate volume Performed at [facility], [address] [place], [place] [address]    Culture   Final    NO GROWTH < 24 HOURS Performed at [facility], [address], [place], [place] [address]    Report Status PENDING  Incomplete  Culture, blood (Routine X 2) w Reflex to ID Panel     Status: None (Preliminary result)   Collection Time: 05/19/19  9:49 AM  Result Value Ref Range Status   Specimen Description   Final    BLOOD RIGHT HAND Performed at [facility], [address] [place], [place] [address]    Special Requests   Final    BOTTLES DRAWN AEROBIC ONLY Blood Culture adequate volume Performed at [facility], [address] [place], [place] [address]    Culture   Final    NO GROWTH < 24 HOURS Performed at [facility], [address] [place], [place] [address]    Report Status PENDING  Incomplete      Radiology Studies: Dg Chest Port 1 View  Result Date: 05/19/2019 CLINICAL DATA:  Acute respiratory failure.  Ventilator support. EXAM: PORTABLE CHEST 1 VIEW COMPARISON:  05/18/2019 FINDINGS: Endotracheal tube tip is 4 cm above the carina. Orogastric or nasogastric tube enters the stomach. Left subclavian central line tip in the SVC above the right atrium. Bilateral patchy pulmonary infiltrates appear similar. No worsening or new finding. Extensive calcified pleural plaques as noted previously. IMPRESSION: Lines and tubes well positioned. Persistent bilateral patchy pulmonary infiltrates without new finding. Electronically Signed   By: Paulina Fusi M.D.   On: 05/19/2019 13:07   Vas [place] Lower Extremity Venous (dvt)  Result Date: 05/20/2019  Lower Venous Study Indications: Swelling, and elevated d-dimer, fever, COVID 19.  Performing  Technologist: Leta Jungling RDCS  Examination Guidelines: A complete evaluation includes B-mode imaging, spectral Doppler, color Doppler, and power Doppler as needed of all accessible portions of each vessel. Bilateral testing is considered an integral part of a complete examination. Limited examinations for reoccurring indications may be performed as noted.  +---------+---------------+---------+-----------+----------+--------------+  RIGHT     Compressibility Phasicity Spontaneity Properties Summary         +---------+---------------+---------+-----------+----------+--------------+  CFV       Full            Yes       Yes                                    +---------+---------------+---------+-----------+----------+--------------+  SFJ       Full                                                             +---------+---------------+---------+-----------+----------+--------------+  FV Prox   Full                                                             +---------+---------------+---------+-----------+----------+--------------+  FV Mid  Full                                                             +---------+---------------+---------+-----------+----------+--------------+  FV Distal Full                                                             +---------+---------------+---------+-----------+----------+--------------+  POP       Full            Yes       Yes                                    +---------+---------------+---------+-----------+----------+--------------+  PTV                                                        Not visualized  +---------+---------------+---------+-----------+----------+--------------+   +---------+---------------+---------+-----------+----------+--------------+  LEFT      Compressibility Phasicity Spontaneity Properties Summary         +---------+---------------+---------+-----------+----------+--------------+  CFV       Full            Yes       Yes                                     +---------+---------------+---------+-----------+----------+--------------+  SFJ       Full                                                             +---------+---------------+---------+-----------+----------+--------------+  FV Prox   Full                                                             +---------+---------------+---------+-----------+----------+--------------+  FV Mid    Full                                                             +---------+---------------+---------+-----------+----------+--------------+  FV Distal Full                                                             +---------+---------------+---------+-----------+----------+--------------+  POP       Full            Yes       Yes                                    +---------+---------------+---------+-----------+----------+--------------+  PTV                                                        Not visualized  +---------+---------------+---------+-----------+----------+--------------+     Summary: Right: No evidence of deep vein thrombosis in the lower extremity. No indirect evidence of obstruction proximal to the inguinal ligament. No cystic structure found in the popliteal fossa. Left: No evidence of deep vein thrombosis in the lower extremity. No indirect evidence of obstruction proximal to the inguinal ligament. No cystic structure found in the popliteal fossa.  *See table(s) above for measurements and observations.    Preliminary     Pamella Pert, MD, PhD [facility]  Contact via  [email or web address]  [facility] Office Info P: [phone]  F: [phone]

## 2019-05-21 NOTE — Progress Notes (Signed)
Spoke with patient's grand daughter. Updated on patient's care and condition.

## 2019-05-21 NOTE — Progress Notes (Signed)
NAME:  Lance Mason, MRN:  [id number], DOB:  [birth year], LOS: 7 ADMISSION DATE:  05/02/2019, CONSULTATION DATE:  05/15/2019 REFERRING MD:  Kerry Hough, CHIEF COMPLAINT:  Dyspnea   Brief History   63 year old male with diabetes type 2, hypertension admitted on May 26 with acute respiratory failure with hypoxemia due to COVID-19 pneumonia.   Past Medical History  Hypertension Hyperlipidemia DM 2  Significant Hospital Events   May 26 admission May 29 intubation, mechanical ventilation May 31 worsening dyssynchrony, hypoxemia, added vecuronium June 1 extubated overnight   Consults:  Pulmonary and critical care medicine  Procedures:  Endotracheal tube May 29 L subclavian CVL May 29 >   Significant Diagnostic Tests:  CT angiogram chest May 27 diffuse bilateral patchy airspace disease groundglass upper lobes predominant, no pulmonary embolism June 1 bilateral lower ext doppler > neg DVT June 1 TTE >>>  Micro Data:  May 21 SARS-CoV-2 positive May 26 blood cultures  Antimicrobials/COVID treatment  May 26 azithromycin May 27 cefepime May 27 vancomycin May 27 remdesivir May 27 Tocilizumab  Interim history/subjective:   On a little less vent support today More synchronous Less periods of tachycardia   Objective   Blood pressure (!) 95/53, pulse 83, temperature 98.4 F (36.9 C), temperature source Axillary, resp. rate 18, height  (1.651 m), weight 105.6 kg, SpO2 98 %. CVP:  [5 mmHg-11 mmHg] 5 mmHg  Vent Mode: PCV FiO2 (%):  [60 %] 60 % Set Rate:  [28 bmp] 28 bmp PEEP:  [10 cmH20-12 cmH20] 10 cmH20 Plateau Pressure:  [21 cmH20-26 cmH20] 24 cmH20   Intake/Output Summary (Last 24 hours) at 05/21/2019 4034 Last data filed at 05/21/2019 0700 Gross per 24 hour  Intake 4043.04 ml  Output 2180 ml  Net 1863.04 ml   Filed Weights   05/18/2019 2354 05/20/19 0500  Weight: 105.5 kg 105.6 kg    Examination:  General:  In bed on vent HENT: NCAT ETT in place PULM: CTA  B, vent supported breathing CV: RRR, no mgr GI: BS+, soft, nontender MSK: normal bulk and tone Neuro: sedated on vent   Resolved Hospital Problem list     Assessment & Plan:  Acute respiratory failure with hypoxemia due to COVID-19 Severe ventilator dyssynchrony May 31  RASS goal -3, continue morphine and midazolam infusions Continue ARDS ventilator settings, tidal volume 6 to 8 cc/kg ideal body weight Lasix today VAP prevention protocol Some pressure support weaning today Wake up assessment in a.m., full spontaneous breathing trial tomorrow if ventilator settings continue to improve.    Fluid overload Lasix today  Shock: low dose levophed needed at this point due to sedation Wean off levophed for MAP > 65  Elevated d-dimer: No evidence of clot Prophylactic heparin per COVID protocol   Mediastinal adenopathy: Likely reactive Repeat CT chest 3 months  Best practice:  Diet: tube feeding Pain/Anxiety/Delirium protocol (if indicated): yes, RASS score -3 VAP protocol (if indicated): yes DVT prophylaxis: heparin infusion for now GI prophylaxis: PPI Glucose control: per TRH Mobility: bed rest Code Status: full Family Communication: per TRH Disposition: remain in ICU  Labs   CBC: Recent Labs  Lab 05/15/19 0455  05/16/19 0455  05/17/19 0125  05/18/19 0100  05/19/19 0400 05/19/19 1101 05/19/19 1448 05/20/19 0425 05/21/19 0215  WBC 7.3  --  8.7  --  9.4  --  13.6*  --  18.7*  --   --   --  12.3*  NEUTROABS 5.4  --  6.9  --  8.3*  --  12.2*  --  14.4*  --   --   --   --   HGB 13.2   < > 14.3   < > 14.8   < > 12.3*   < > 14.5 13.9 12.9* 12.9* 12.8*  HCT 39.6   < > 41.7   < > 44.8   < > 37.8*   < > 44.0 41.0 38.0* 38.0* 39.2  MCV 91.7  --  92.1  --  91.8  --  95.0  --  95.2  --   --   --  95.4  PLT 245  --  312  --  316  --  230  --  361  --   --   --  220   < > = values in this interval not displayed.    Basic Metabolic Panel: Recent Labs  Lab 05/15/19 0455   05/16/19 0455  05/17/19 0125  05/18/19 0100  05/19/19 0400 05/19/19 1101 05/19/19 1448 05/20/19 0230 05/20/19 0425 05/21/19 0215  NA 136   < > 139   < > 141   < > 150*   < > 149* 148* 147* 141 144 138  K 4.0   < > 3.8   < > 3.8   < > 4.2   < > 3.7 3.6 3.8 4.3 4.3 4.4  CL 102  --  105  --  103  --  116*  --  112*  --   --  101  --  97*  CO2 25  --  24  --  24  --  24  --  25  --   --  32  --  30  GLUCOSE 146*  --  169*  --  252*  --  164*  --  165*  --   --  156*  --  245*  BUN 10  --  10  --  19  --  37*  --  34*  --   --  26*  --  35*  CREATININE 0.68  --  0.65  --  0.79  --  1.04  --  0.90  --   --  0.61  --  0.66  CALCIUM 7.8*  --  7.8*  --  8.1*  --  7.9*  --  8.0*  --   --  7.9*  --  8.3*  MG 2.2  --  2.2  --  2.5*  --  2.8*  --  2.6*  --   --   --   --   --   PHOS 2.4*  --  3.1  --  2.3*  --  3.1  --  2.8  --   --   --   --   --    < > = values in this interval not displayed.   GFR: Estimated Creatinine Clearance: 105.7 mL/min (by C-G formula based on SCr of 0.66 mg/dL). Recent Labs  Lab 05-06-2019 1815 05-06-2019 2015  05/17/19 0125 05/18/19 0100 05/19/19 0400 05/19/19 1040 05/20/19 0230 05/21/19 0215  PROCALCITON 0.92  --   --   --   --   --  0.65 0.29 0.17  WBC 7.5  --    < > 9.4 13.6* 18.7*  --   --  12.3*  LATICACIDVEN  --  1.5  --   --   --   --   --   --   --    < > =  values in this interval not displayed.    Liver Function Tests: Recent Labs  Lab 05/17/19 0125 05/18/19 0100 05/19/19 0400 05/20/19 0230 05/21/19 0215  AST 118* 65* 63* 53* 44*  ALT 136* 88* 79* 60* 57*  ALKPHOS 183* 186* 227* 150* 130*  BILITOT 0.4 0.1* 0.1* 0.2* 0.4  PROT 7.1 6.5 7.4 6.0* 5.8*  ALBUMIN 2.6* 2.5* 3.1* 2.6* 2.5*   No results for input(s): LIPASE, AMYLASE in the last 168 hours. No results for input(s): AMMONIA in the last 168 hours.  ABG    Component Value Date/Time   PHART 7.349 (L) 05/20/2019 0425   PCO2ART 62.1 (H) 05/20/2019 0425   PO2ART 64.0 (L) 05/20/2019  0425   HCO3 34.2 (H) 05/20/2019 0425   TCO2 36 (H) 05/20/2019 0425   ACIDBASEDEF 3.0 (H) 05/18/2019 1601   O2SAT 90.0 05/20/2019 0425     Coagulation Profile: No results for input(s): INR, PROTIME in the last 168 hours.  Cardiac Enzymes: Recent Labs  Lab May 18, 2019 1815  05/18/2019 2018 05/15/19 0450 05/15/19 0455 05/16/19 0455 05/17/19 0125 05/18/19 0100 05/19/19 0400 05/19/19 1040  CKTOTAL  --    < > 62  --  74 64 115 88 498*  --   CKMB  --   --  1.7  --   --   --   --   --   --   --   TROPONINI <0.03  --   --  <0.03  --   --   --   --   --  0.06*   < > = values in this interval not displayed.    HbA1C: Hemoglobin A1C  Date/Time Value Ref Range Status  06/19/2018 04:01 PM 9.1 (A) 4.0 - 5.6 % Final  01/02/2017 06:51 PM 12.8  Final    CBG: Recent Labs  Lab 05/20/19 1255 05/20/19 1601 05/20/19 2010 05/20/19 2345 05/21/19 0315  GLUCAP 189* 305* 339* 292* 249*     Critical care time: 32 minutes     Heber [place], MD [place] [facility] Pager: [phone] Cell: [phone] If no response, call [phone]

## 2019-05-22 ENCOUNTER — Other Ambulatory Visit: Payer: Self-pay

## 2019-05-22 DIAGNOSIS — E119 Type 2 diabetes mellitus without complications: Secondary | ICD-10-CM

## 2019-05-22 LAB — GLUCOSE, CAPILLARY
Glucose-Capillary: 364 mg/dL — ABNORMAL HIGH (ref 70–99)
Glucose-Capillary: 296 mg/dL — ABNORMAL HIGH (ref 70–99)
Glucose-Capillary: 191 mg/dL — ABNORMAL HIGH (ref 70–99)
Glucose-Capillary: 336 mg/dL — ABNORMAL HIGH (ref 70–99)
Glucose-Capillary: 294 mg/dL — ABNORMAL HIGH (ref 70–99)

## 2019-05-22 LAB — CBC
HCT: 39.9 % (ref 39.0–52.0)
Hemoglobin: 12.9 g/dL — ABNORMAL LOW (ref 13.0–17.0)
MCH: 30.2 pg (ref 26.0–34.0)
MCHC: 32.3 g/dL (ref 30.0–36.0)
MCV: 93.4 fL (ref 80.0–100.0)
Platelets: 215 10*3/uL (ref 150–400)
RBC: 4.27 MIL/uL (ref 4.22–5.81)
RDW: 13 % (ref 11.5–15.5)
WBC: 10.9 10*3/uL — ABNORMAL HIGH (ref 4.0–10.5)
nRBC: 0 % (ref 0.0–0.2)

## 2019-05-22 LAB — COMPREHENSIVE METABOLIC PANEL
ALT: 69 U/L — ABNORMAL HIGH (ref 0–44)
AST: 64 U/L — ABNORMAL HIGH (ref 15–41)
Albumin: 2.7 g/dL — ABNORMAL LOW (ref 3.5–5.0)
Alkaline Phosphatase: 124 U/L (ref 38–126)
Anion gap: 10 (ref 5–15)
BUN: 39 mg/dL — ABNORMAL HIGH (ref 8–23)
CO2: 33 mmol/L — ABNORMAL HIGH (ref 22–32)
Calcium: 8 mg/dL — ABNORMAL LOW (ref 8.9–10.3)
Chloride: 93 mmol/L — ABNORMAL LOW (ref 98–111)
Creatinine, Ser: 0.64 mg/dL (ref 0.61–1.24)
GFR calc Af Amer: >60 mL/min (ref >60)
GFR calc non Af Amer: >60 mL/min (ref >60)
Glucose, Bld: 365 mg/dL — ABNORMAL HIGH (ref 70–99)
Potassium: 4 mmol/L (ref 3.5–5.1)
Sodium: 136 mmol/L (ref 135–145)
Total Bilirubin: 0.6 mg/dL (ref 0.3–1.2)
Total Protein: 6.2 g/dL — ABNORMAL LOW (ref 6.5–8.1)

## 2019-05-22 MED ORDER — INSULIN GLARGINE 100 UNIT/ML ~~LOC~~ SOLN
30.0000 [IU] | Freq: Two times a day (BID) | SUBCUTANEOUS | Status: DC
  Administered 2019-05-22 (×2): 30 [IU] via SUBCUTANEOUS
  Filled 2019-05-22 (×3): qty 0.3

## 2019-05-22 MED ORDER — CHLORHEXIDINE GLUCONATE CLOTH 2 % EX PADS
6.0000 | MEDICATED_PAD | Freq: Every morning | CUTANEOUS | Status: DC
  Administered 2019-05-22 – 2019-05-28 (×7): 6 via TOPICAL

## 2019-05-22 MED ORDER — FUROSEMIDE 10 MG/ML IJ SOLN
60.0000 mg | Freq: Once | INTRAMUSCULAR | Status: AC
  Administered 2019-05-22: 60 mg via INTRAVENOUS
  Filled 2019-05-22: qty 6

## 2019-05-22 MED ORDER — IPRATROPIUM-ALBUTEROL 0.5-2.5 (3) MG/3ML IN SOLN
3.0000 mL | Freq: Four times a day (QID) | RESPIRATORY_TRACT | Status: DC
  Administered 2019-05-22 – 2019-05-27 (×19): 3 mL via RESPIRATORY_TRACT
  Filled 2019-05-22 (×20): qty 3

## 2019-05-22 NOTE — Progress Notes (Signed)
PROGRESS NOTE    Lance Mason  ZOX:[id number] DOB: 11/02/1956 DOA: Jun 02, 2019 PCP: Patient, No Pcp Per      Brief Narrative:  [name] is a 63 y.o. M with DM, obesity, and HTN who presented with 1 week shortness of breath and cough, then fever and worsening SOB.  In the ER, SpO2 84%, required non-rebreather to maintain O2 sat >90%.  CXR with bilateral opacities.    Intubated after 3 days.     Assessment & Plan:  Coronavirus pneumonitis with acute hypoxic respiratory failure In setting of ongoing 2020 COVID-19 pandemic.  Completed azithromycin 8 days, cefepime 7 days, 6 days Solu-Medrol. Convalescent plasma on 5/29 S/p Actemra on 5/27 S/p remdesivir 5 days, 5/27 to 5/31  Inflammatory markers have trended down from peak. -Taper steroids, today day 7 -VTE PPx with Lovenox, ICU dose  -Daily d-dimer, ferritin and CRP -Continue scheduled bronchodilators -Optimize pulmonary toilet -Repeat IV Lasix -Close monitoring of K and renal function    COVID-19 Labs Recent Labs    05/20/19 0230 05/21/19 0215  DDIMER 5.03* 5.36*  FERRITIN 627* 1,086*  970*  LDH 649* 547*  CRP 1.6* 0.9  0.8      Diabetes Hyperglycemic yesterday, got >100 units insulin. -Hold home metformin, glipizide -Continue Lantus, increase dose -Continue SSI correction insulin  Hypertension BP soft.  Not on home meds.  Oral candidiasis -Continue fluconazole, day 2 of 7  Hypernatremia Resolved      MDM and disposition: The below labs and imaging reports were reviewed and summarized above.  Medication management as above.  The patient was admitted with acute hypoxic respiratory failure from COVID-19.    The patient is critically ill with multi-organ failure.  Critical care was necessary to treat or prevent imminent or life-threatening deterioration of sepsis, respiratory failure, and was exclusive of separately billable procedures and treating other patients. Total  critical care time spent by me: 45 minutes Time spent personally by me on obtaining history from patient or surrogate, evaluation of the patient, evaluation of patient's response to treatment, ordering and review of laboratory studies, ordering and review of radiographic studies, ordering and performing treatments and interventions, and re-evaluation of the patient's condition.      Ventilator best practice:  Diet: TFs VAP protocol (if indicated): Yes GI prophylaxis: Famotidine Glucose control: Insulin  Mobility: N/A  DVT prophylaxis: Lovenox Code Status: FULL     Consultants:   PCCM  Procedures:   5/29 ETT  5/29 L subclavian central line  Antimicrobials:   Azithromycin 5/26 >> 6/2  Cefepime 5/27 >> 6/2  Fluconazole 6/2 >>  Remdesivir 5/27 >> 5/31  Vancomycin 5/26 >> 5/28   Culture data:   MRSA nares swab-negative on admission  5/26 blood culture x1-no growth to date  5/31 blood culture x2 --no growth to date  5/31 respiratory culture- normal respiratory flora       Subjective: Intubated and sedated  Objective: Vitals:   05/22/19 0600 05/22/19 0757 05/22/19 1200 05/22/19 1214  BP: 105/62 114/62  109/64  Pulse: 76 81  75  Resp: (!) 26 (!) 24  (!) 29  Temp:   98.9 F (37.2 C)   TempSrc:   Axillary   SpO2: 92% 91%  93%  Weight:      Height:        Intake/Output Summary (Last 24 hours) at 05/22/2019 1525 Last data filed at 05/22/2019 1001 Gross per 24 hour  Intake 3408.15 ml  Output 3045  ml  Net 363.15 ml   Filed Weights   2019-06-09 2354 05/20/19 0500  Weight: 105.5 kg 105.6 kg    Examination: General appearance: Obese adult male, intubated and sedated HEENT: Anicteric, conjunctiva normal, pupils equal, lids and lashes normal. No nasal deformity, discharge, epistaxis.    Skin: Warm and dry.  Nojaundice.  No suspicious rashes or lesions. Cardiac: RRR, nl S1-S2, no murmurs appreciated.  Capillary refill is sluggish.  JVP not visible.  No  LE edema.  DP pulses not palpable, extremities cool.   Respiratory: Intubated, wheezing bilaterally.  Lung sounds diminished. Abdomen: Abdomen soft.  No grimace to palpation. No ascites, distension, hepatosplenomegaly.   Neuro/Psych: Sedated.  Pupils equal.   Withdraws from pain     Data Reviewed: I have personally reviewed following labs and imaging studies:  CBC: Recent Labs  Lab 05/16/19 0455  05/17/19 0125  05/18/19 0100  05/19/19 0400 05/19/19 1101 05/19/19 1448 05/20/19 0425 05/21/19 0215 05/22/19 0150  WBC 8.7  --  9.4  --  13.6*  --  18.7*  --   --   --  12.3* 10.9*  NEUTROABS 6.9  --  8.3*  --  12.2*  --  14.4*  --   --   --   --   --   HGB 14.3   < > 14.8   < > 12.3*   < > 14.5 13.9 12.9* 12.9* 12.8* 12.9*  HCT 41.7   < > 44.8   < > 37.8*   < > 44.0 41.0 38.0* 38.0* 39.2 39.9  MCV 92.1  --  91.8  --  95.0  --  95.2  --   --   --  95.4 93.4  PLT 312  --  316  --  230  --  361  --   --   --  220 215   < > = values in this interval not displayed.   Basic Metabolic Panel: Recent Labs  Lab 05/16/19 0455  05/17/19 0125  05/18/19 0100  05/19/19 0400  05/19/19 1448 05/20/19 0230 05/20/19 0425 05/21/19 0215 05/22/19 0150  NA 139   < > 141   < > 150*   < > 149*   < > 147* 141 144 138 136  K 3.8   < > 3.8   < > 4.2   < > 3.7   < > 3.8 4.3 4.3 4.4 4.0  CL 105  --  103  --  116*  --  112*  --   --  101  --  97* 93*  CO2 24  --  24  --  24  --  25  --   --  32  --  30 33*  GLUCOSE 169*  --  252*  --  164*  --  165*  --   --  156*  --  245* 365*  BUN 10  --  19  --  37*  --  34*  --   --  26*  --  35* 39*  CREATININE 0.65  --  0.79  --  1.04  --  0.90  --   --  0.61  --  0.66 0.64  CALCIUM 7.8*  --  8.1*  --  7.9*  --  8.0*  --   --  7.9*  --  8.3* 8.0*  MG 2.2  --  2.5*  --  2.8*  --  2.6*  --   --   --   --   --   --  PHOS 3.1  --  2.3*  --  3.1  --  2.8  --   --   --   --   --   --    < > = values in this interval not displayed.   GFR: Estimated Creatinine  Clearance: 105.7 mL/min (by C-G formula based on SCr of 0.64 mg/dL). Liver Function Tests: Recent Labs  Lab 05/18/19 0100 05/19/19 0400 05/20/19 0230 05/21/19 0215 05/22/19 0150  AST 65* 63* 53* 44* 64*  ALT 88* 79* 60* 57* 69*  ALKPHOS 186* 227* 150* 130* 124  BILITOT 0.1* 0.1* 0.2* 0.4 0.6  PROT 6.5 7.4 6.0* 5.8* 6.2*  ALBUMIN 2.5* 3.1* 2.6* 2.5* 2.7*   No results for input(s): LIPASE, AMYLASE in the last 168 hours. No results for input(s): AMMONIA in the last 168 hours. Coagulation Profile: No results for input(s): INR, PROTIME in the last 168 hours. Cardiac Enzymes: Recent Labs  Lab 05/16/19 0455 05/17/19 0125 05/18/19 0100 05/19/19 0400 05/19/19 1040  CKTOTAL 64 115 88 498*  --   TROPONINI  --   --   --   --  0.06*   BNP (last 3 results) No results for input(s): PROBNP in the last 8760 hours. HbA1C: No results for input(s): HGBA1C in the last 72 hours. CBG: Recent Labs  Lab 05/21/19 1929 05/21/19 2329 05/22/19 0332 05/22/19 0750 05/22/19 1211  GLUCAP 267* 336* 364* 296* 191*   Lipid Profile: No results for input(s): CHOL, HDL, LDLCALC, TRIG, CHOLHDL, LDLDIRECT in the last 72 hours. Thyroid Function Tests: No results for input(s): TSH, T4TOTAL, FREET4, T3FREE, THYROIDAB in the last 72 hours. Anemia Panel: Recent Labs    05/20/19 0230 05/21/19 0215  FERRITIN 627* 1,086*  970*   Urine analysis:    Component Value Date/Time   COLORURINE YELLOW 05/28/2016 2056   APPEARANCEUR CLEAR 05/28/2016 2056   LABSPEC 1.041 (H) 05/28/2016 2056   PHURINE 5.0 05/28/2016 2056   GLUCOSEU >1000 (A) 05/28/2016 2056   HGBUR NEGATIVE 05/28/2016 2056   BILIRUBINUR negative 06/19/2018 1601   KETONESUR negative 06/19/2018 1601   KETONESUR 15 (A) 05/28/2016 2056   PROTEINUR negative 06/19/2018 1601   PROTEINUR NEGATIVE 05/28/2016 2056   UROBILINOGEN 0.2 06/19/2018 1601   NITRITE Negative 06/19/2018 1601   NITRITE NEGATIVE 05/28/2016 2056   LEUKOCYTESUR Negative  06/19/2018 1601   Sepsis Labs: (procalcitonin:4,lacticacidven:4)  ) Recent Results (from the past 240 hour(s))  Culture, blood (Routine X 2) w Reflex to ID Panel     Status: None   Collection Time: 2019/06/05  8:18 PM  Result Value Ref Range Status   Specimen Description   Final    BLOOD BLOOD LEFT FOREARM Performed at [facility], [address], [place], [place] [address]    Special Requests   Final    BOTTLES DRAWN AEROBIC AND ANAEROBIC Blood Culture adequate volume Performed at [facility], [address] [place], [place] [address]    Culture   Final    NO GROWTH 5 DAYS Performed at [facility], [address] [place], [place] [address]    Report Status 05/19/2019 FINAL  Final  MRSA PCR Screening     Status: None   Collection Time: 05/15/19  1:55 PM  Result Value Ref Range Status   MRSA by PCR NEGATIVE NEGATIVE Final    Comment:        The GeneXpert MRSA Assay (FDA approved for NASAL specimens only), is one component of  a comprehensive MRSA colonization surveillance program. It is not intended to diagnose MRSA infection nor to guide or monitor treatment for MRSA infections. Performed at [facility], [address] [place], [place] [address]   Culture, respiratory (non-expectorated)     Status: None   Collection Time: 05/19/19  9:44 AM  Result Value Ref Range Status   Specimen Description   Final    TRACHEAL ASPIRATE Performed at [facility], [address] [place], [place] [address]    Special Requests   Final    NONE Performed at [facility], [address] [place], [place] [address]    Gram Stain   Final    RARE WBC PRESENT, PREDOMINANTLY PMN RARE GRAM POSITIVE COCCI    Culture   Final    FEW Consistent with normal respiratory flora. Performed at [facility], [address], [place], [place] [address]    Report Status 05/21/2019  FINAL  Final  Culture, blood (Routine X 2) w Reflex to ID Panel     Status: None (Preliminary result)   Collection Time: 05/19/19  9:44 AM  Result Value Ref Range Status   Specimen Description   Final    RIGHT ANTECUBITAL Performed at [facility], [address], [place], [place] [address]    Special Requests   Final    BOTTLES DRAWN AEROBIC ONLY Blood Culture adequate volume Performed at [facility], [address] [place], [place] [address]    Culture   Final    NO GROWTH 3 DAYS Performed at [facility], [address], [place], [place] [address]    Report Status PENDING  Incomplete  Culture, blood (Routine X 2) w Reflex to ID Panel     Status: None (Preliminary result)   Collection Time: 05/19/19  9:49 AM  Result Value Ref Range Status   Specimen Description   Final    BLOOD RIGHT HAND Performed at [facility], [address] [place], [place] [address]    Special Requests   Final    BOTTLES DRAWN AEROBIC ONLY Blood Culture adequate volume Performed at [facility], [address], [place], [place] [address]    Culture   Final    NO GROWTH 3 DAYS Performed at [facility], [address], [place], [place] [address]    Report Status PENDING  Incomplete         Radiology Studies: No results found.      Scheduled Meds: . chlorhexidine  15 mL Mouth/Throat BID  . Chlorhexidine Gluconate Cloth  6 each Topical q morning - 10a  . enoxaparin (LOVENOX) injection  50 mg Subcutaneous Q12H  . famotidine  20 mg Oral BID  . free water  250 mL Per Tube Q4H  . guaiFENesin-dextromethorphan  15 mL Oral Q6H  . insulin aspart  0-20 Units Subcutaneous Q4H  . insulin aspart  3 Units Subcutaneous Q4H  . insulin glargine  30 Units Subcutaneous BID  . ipratropium-albuterol  3 mL Nebulization Q6H  . mouth rinse  15 mL Mouth Rinse 10 times per day  . methylPREDNISolone (SOLU-MEDROL) injection   50 mg Intravenous Q12H  .  morphine injection  2 mg Intravenous Once  . multivitamin with minerals  1 tablet Per Tube Daily  . sodium chloride flush  10-40 mL Intracatheter Q12H   Continuous Infusions: . sodium chloride 10 mL/hr at 05/22/19 0600  .  sodium chloride 100 mL/hr at 05/19/19 0400  . feeding supplement (VITAL HIGH PROTEIN) 1,000 mL (05/22/19 1142)  . fluconazole (DIFLUCAN) IV    . midazolam 4 mg/hr (05/22/19 1335)  . morphine 4 mg/hr (05/22/19 1334)  . norepinephrine (LEVOPHED) Adult infusion Stopped (05/20/19 0101)     LOS: 8 days    Time spent:   During this encounter: Patient Isolation: Airborne, contact, droplet HCP PPE: Face shield, head covering, N95, gown, gloves, and shoe covers    Alberteen Samhristopher P Lajada Janes, MD [facility] 05/22/2019, 3:25 PM     Please page through AMION:  [email or web address] Password [email or web address] If 7PM-7AM, please contact night-coverage

## 2019-05-22 NOTE — Progress Notes (Signed)
Spoke with Steward Drone by phone with updates. She also requested to FaceTime. Called her back with ability to FaceTime where she included an aunt and niece. They spoke to the patient for roughly 10 minutes. Everyone was very pleasant.

## 2019-05-22 NOTE — Progress Notes (Signed)
Spoke with patient's granddaughter, Steward Drone at [phone] and gave her updates on patient. She said she did not have any questions regarding patient's condition as it appears there have been no changes since she spoke to the night nurse. She said she would relay all information to her family.

## 2019-05-22 NOTE — Progress Notes (Signed)
Spoke with patient's granddaughter, Lance Mason. She stated that patient's daughters, her aunts, were trying to change the primary contact to be Lance Mason, the patient's daughter. Lance Mason said that Ms. Caroll Rancher does not speak any English and the reason they want to change the primary contact is because they would like to speak to hospital staff many times throughout the day,. Lance Mason stated that she reported all updates to the family and explained to them that the hospital staff is very busy but that they speak to her at least twice a day. I spoke to Rosey Bath, Press photographer and explained the situation. It is agreed that Lance Mason will remain the main contact as it was decided by the family when the patient was admitted that she would be the primary contact and relay all information to the family. Lance Mason has set up a password and it is  ([id number]).

## 2019-05-22 NOTE — Progress Notes (Signed)
NAME:  Lance Mason, MRN:  [id number], DOB:  Sep 09, 1956, LOS: 8 ADMISSION DATE:  05/01/2019, CONSULTATION DATE:  05/15/2019 REFERRING MD:  Kerry HoughMemon, CHIEF COMPLAINT:  Dyspnea   Brief History   63 year old male with diabetes type 2, hypertension admitted on May 26 with acute respiratory failure with hypoxemia due to COVID-19 pneumonia.   Past Medical History  Hypertension Hyperlipidemia DM 2  Significant Hospital Events   May 26 admission May 29 intubation, mechanical ventilation May 31 worsening dyssynchrony, hypoxemia, added vecuronium June 1 extubated overnight   Consults:  Pulmonary and critical care medicine  Procedures:  Endotracheal tube May 29 L subclavian CVL May 29 >   Significant Diagnostic Tests:  CT angiogram chest May 27 diffuse bilateral patchy airspace disease groundglass upper lobes predominant, no pulmonary embolism June 1 bilateral lower ext doppler > neg DVT June 1 TTE >>>  Micro Data:  May 21 SARS-CoV-2 positive May 26 blood cultures  Antimicrobials/COVID treatment  May 26 azithromycin May 27 cefepime May 27 vancomycin May 27 remdesivir May 27 Tocilizumab  Interim history/subjective:  No events overnight. Tolerated diuresis yesterday.  Objective   Blood pressure 109/64, pulse 75, temperature 98.9 F (37.2 C), temperature source Axillary, resp. rate (!) 29, height 5\' 5"  (1.651 m), weight 105.6 kg, SpO2 93 %. CVP:  [4 mmHg-22 mmHg] 9 mmHg  Vent Mode: PCV FiO2 (%):  [60 %] 60 % Set Rate:  [28 bmp] 28 bmp Vt Set:  [440 mL] 440 mL PEEP:  [5 cmH20] 5 cmH20 Plateau Pressure:  [15 cmH20-21 cmH20] 19 cmH20   Intake/Output Summary (Last 24 hours) at 05/22/2019 1311 Last data filed at 05/22/2019 1001 Gross per 24 hour  Intake 3544.14 ml  Output 3620 ml  Net -75.86 ml   Filed Weights   05/16/2019 2354 05/20/19 0500  Weight: 105.5 kg 105.6 kg   Physical Exam: General: Critically ill-appearing, sedated HENT: [place], AT, ETT in place Eyes: EOMI,  no scleral icterus Respiratory: Diminished breath sounds bilaterally. No wheezing Cardiovascular: RRR, -M/R/G, no JVD GI: BS+, soft, nontender Extremities:-Edema,-tenderness Neuro: AAO x4, CNII-XII grossly intact Skin: Intact, no rashes or bruising Psych: Normal mood, normal affect GU: Foley in place  Resolved Hospital Problem list   Shock secondary to sedation  Assessment & Plan:  Acute respiratory failure with hypoxemia due to COVID-19: CFB +6L Completed Tocilizumab Completed Remdesivir Wean per [facility] protocol, tidal volume 6 to 8 cc/kg ideal body weight RASS goal -3, continue morphine and midazolam infusions Scheduled bronchodilator Aggressive diuresis Titrate methylpred off VAP prevention protocol  Oral Thrush Fluconazole x 7 days (end date 6/8)  Elevated d-dimer: No evidence of clot Prophylactic heparin per COVID protocol   Hyperglycemia Per TRH  Mediastinal adenopathy: Likely reactive Repeat CT chest 3 months  Best practice:  Diet: tube feeding Pain/Anxiety/Delirium protocol (if indicated): yes, RASS score -3 VAP protocol (if indicated): yes DVT prophylaxis: Lovenox GI prophylaxis: PPI Glucose control: per TRH Mobility: bed rest Code Status: full Family Communication: per TRH Disposition: ICU  Labs   CBC: Recent Labs  Lab 05/16/19 0455  05/17/19 0125  05/18/19 0100  05/19/19 0400 05/19/19 1101 05/19/19 1448 05/20/19 0425 05/21/19 0215 05/22/19 0150  WBC 8.7  --  9.4  --  13.6*  --  18.7*  --   --   --  12.3* 10.9*  NEUTROABS 6.9  --  8.3*  --  12.2*  --  14.4*  --   --   --   --   --  HGB 14.3   < > 14.8   < > 12.3*   < > 14.5 13.9 12.9* 12.9* 12.8* 12.9*  HCT 41.7   < > 44.8   < > 37.8*   < > 44.0 41.0 38.0* 38.0* 39.2 39.9  MCV 92.1  --  91.8  --  95.0  --  95.2  --   --   --  95.4 93.4  PLT 312  --  316  --  230  --  361  --   --   --  220 215   < > = values in this interval not displayed.    Basic Metabolic Panel: Recent Labs  Lab  05/16/19 0455  05/17/19 0125  05/18/19 0100  05/19/19 0400  05/19/19 1448 05/20/19 0230 05/20/19 0425 05/21/19 0215 05/22/19 0150  NA 139   < > 141   < > 150*   < > 149*   < > 147* 141 144 138 136  K 3.8   < > 3.8   < > 4.2   < > 3.7   < > 3.8 4.3 4.3 4.4 4.0  CL 105  --  103  --  116*  --  112*  --   --  101  --  97* 93*  CO2 24  --  24  --  24  --  25  --   --  32  --  30 33*  GLUCOSE 169*  --  252*  --  164*  --  165*  --   --  156*  --  245* 365*  BUN 10  --  19  --  37*  --  34*  --   --  26*  --  35* 39*  CREATININE 0.65  --  0.79  --  1.04  --  0.90  --   --  0.61  --  0.66 0.64  CALCIUM 7.8*  --  8.1*  --  7.9*  --  8.0*  --   --  7.9*  --  8.3* 8.0*  MG 2.2  --  2.5*  --  2.8*  --  2.6*  --   --   --   --   --   --   PHOS 3.1  --  2.3*  --  3.1  --  2.8  --   --   --   --   --   --    < > = values in this interval not displayed.   GFR: Estimated Creatinine Clearance: 105.7 mL/min (by C-G formula based on SCr of 0.64 mg/dL). Recent Labs  Lab 05/18/19 0100 05/19/19 0400 05/19/19 1040 05/20/19 0230 05/21/19 0215 05/22/19 0150  PROCALCITON  --   --  0.65 0.29 0.17  --   WBC 13.6* 18.7*  --   --  12.3* 10.9*    Liver Function Tests: Recent Labs  Lab 05/18/19 0100 05/19/19 0400 05/20/19 0230 05/21/19 0215 05/22/19 0150  AST 65* 63* 53* 44* 64*  ALT 88* 79* 60* 57* 69*  ALKPHOS 186* 227* 150* 130* 124  BILITOT 0.1* 0.1* 0.2* 0.4 0.6  PROT 6.5 7.4 6.0* 5.8* 6.2*  ALBUMIN 2.5* 3.1* 2.6* 2.5* 2.7*   No results for input(s): LIPASE, AMYLASE in the last 168 hours. No results for input(s): AMMONIA in the last 168 hours.  ABG    Component Value Date/Time   PHART 7.349 (L) 05/20/2019 0425   PCO2ART 62.1 (H) 05/20/2019 0425  PO2ART 64.0 (L) 05/20/2019 0425   HCO3 34.2 (H) 05/20/2019 0425   TCO2 36 (H) 05/20/2019 0425   ACIDBASEDEF 3.0 (H) 05/18/2019 1601   O2SAT 90.0 05/20/2019 0425     Coagulation Profile: No results for input(s): INR, PROTIME in the last  168 hours.  Cardiac Enzymes: Recent Labs  Lab 05/16/19 0455 05/17/19 0125 05/18/19 0100 05/19/19 0400 05/19/19 1040  CKTOTAL 64 115 88 498*  --   TROPONINI  --   --   --   --  0.06*    HbA1C: Hemoglobin A1C  Date/Time Value Ref Range Status  06/19/2018 04:01 PM 9.1 (A) 4.0 - 5.6 % Final  01/02/2017 06:51 PM 12.8  Final    CBG: Recent Labs  Lab 05/21/19 1929 05/21/19 2329 05/22/19 0332 05/22/19 0750 05/22/19 1211  GLUCAP 267* 336* 364* 296* 191*     Critical care time: 31 min    The patient is critically ill with multiple organ systems failure and requires high complexity decision making for assessment and support, frequent evaluation and titration of therapies, application of advanced monitoring technologies and extensive interpretation of multiple databases.   Discussed and co-managed patient care with PCCM-Hospitalist. Coordinated care with RT, RN and pharmacist.  Mechele Collin, M.D. [facility] Pager: [phone] After hours pager: [phone]

## 2019-05-23 ENCOUNTER — Other Ambulatory Visit: Payer: Self-pay

## 2019-05-23 DIAGNOSIS — R739 Hyperglycemia, unspecified: Secondary | ICD-10-CM

## 2019-05-23 LAB — POCT I-STAT 7, (LYTES, BLD GAS, ICA,H+H)
Acid-Base Excess: 11 mmol/L — ABNORMAL HIGH (ref 0.0–2.0)
Acid-Base Excess: 13 mmol/L — ABNORMAL HIGH (ref 0.0–2.0)
Bicarbonate: 36.1 mmol/L — ABNORMAL HIGH (ref 20.0–28.0)
Bicarbonate: 38.2 mmol/L — ABNORMAL HIGH (ref 20.0–28.0)
Calcium, Ion: 1.17 mmol/L (ref 1.15–1.40)
Calcium, Ion: 1.14 mmol/L — ABNORMAL LOW (ref 1.15–1.40)
HCT: 36 % — ABNORMAL LOW (ref 39.0–52.0)
HCT: 38 % — ABNORMAL LOW (ref 39.0–52.0)
Hemoglobin: 12.2 g/dL — ABNORMAL LOW (ref 13.0–17.0)
Hemoglobin: 12.9 g/dL — ABNORMAL LOW (ref 13.0–17.0)
O2 Saturation: 94 %
O2 Saturation: 93 %
Patient temperature: 98.6
Patient temperature: 98.1
Potassium: 3.7 mmol/L (ref 3.5–5.1)
Potassium: 4.6 mmol/L (ref 3.5–5.1)
Sodium: 133 mmol/L — ABNORMAL LOW (ref 135–145)
Sodium: 135 mmol/L (ref 135–145)
TCO2: 38 mmol/L — ABNORMAL HIGH (ref 22–32)
TCO2: 40 mmol/L — ABNORMAL HIGH (ref 22–32)
pCO2 arterial: 48.1 mmHg — ABNORMAL HIGH (ref 32.0–48.0)
pCO2 arterial: 48.8 mmHg — ABNORMAL HIGH (ref 32.0–48.0)
pH, Arterial: 7.484 — ABNORMAL HIGH (ref 7.350–7.450)
pH, Arterial: 7.501 — ABNORMAL HIGH (ref 7.350–7.450)
pO2, Arterial: 69 mmHg — ABNORMAL LOW (ref 83.0–108.0)
pO2, Arterial: 62 mmHg — ABNORMAL LOW (ref 83.0–108.0)

## 2019-05-23 LAB — CBC
HCT: 40.7 % (ref 39.0–52.0)
Hemoglobin: 13.5 g/dL (ref 13.0–17.0)
MCH: 31 pg (ref 26.0–34.0)
MCHC: 33.2 g/dL (ref 30.0–36.0)
MCV: 93.3 fL (ref 80.0–100.0)
Platelets: 213 10*3/uL (ref 150–400)
RBC: 4.36 MIL/uL (ref 4.22–5.81)
RDW: 13.2 % (ref 11.5–15.5)
WBC: 14.2 10*3/uL — ABNORMAL HIGH (ref 4.0–10.5)
nRBC: 0.2 % (ref 0.0–0.2)

## 2019-05-23 LAB — COMPREHENSIVE METABOLIC PANEL
ALT: 71 U/L — ABNORMAL HIGH (ref 0–44)
AST: 55 U/L — ABNORMAL HIGH (ref 15–41)
Albumin: 2.9 g/dL — ABNORMAL LOW (ref 3.5–5.0)
Alkaline Phosphatase: 123 U/L (ref 38–126)
Anion gap: 13 (ref 5–15)
BUN: 34 mg/dL — ABNORMAL HIGH (ref 8–23)
CO2: 31 mmol/L (ref 22–32)
Calcium: 8.5 mg/dL — ABNORMAL LOW (ref 8.9–10.3)
Chloride: 91 mmol/L — ABNORMAL LOW (ref 98–111)
Creatinine, Ser: 0.58 mg/dL — ABNORMAL LOW (ref 0.61–1.24)
GFR calc Af Amer: >60 mL/min (ref >60)
GFR calc non Af Amer: >60 mL/min (ref >60)
Glucose, Bld: 294 mg/dL — ABNORMAL HIGH (ref 70–99)
Potassium: 4 mmol/L (ref 3.5–5.1)
Sodium: 135 mmol/L (ref 135–145)
Total Bilirubin: 0.6 mg/dL (ref 0.3–1.2)
Total Protein: 6.2 g/dL — ABNORMAL LOW (ref 6.5–8.1)

## 2019-05-23 LAB — GLUCOSE, CAPILLARY
Glucose-Capillary: 144 mg/dL — ABNORMAL HIGH (ref 70–99)
Glucose-Capillary: 167 mg/dL — ABNORMAL HIGH (ref 70–99)
Glucose-Capillary: 260 mg/dL — ABNORMAL HIGH (ref 70–99)
Glucose-Capillary: 275 mg/dL — ABNORMAL HIGH (ref 70–99)
Glucose-Capillary: 282 mg/dL — ABNORMAL HIGH (ref 70–99)
Glucose-Capillary: 315 mg/dL — ABNORMAL HIGH (ref 70–99)
Glucose-Capillary: 326 mg/dL — ABNORMAL HIGH (ref 70–99)

## 2019-05-23 LAB — D-DIMER, QUANTITATIVE: D-Dimer, Quant: 2.76 ug/mL-FEU — ABNORMAL HIGH (ref 0.00–0.50)

## 2019-05-23 LAB — HEMOGLOBIN A1C
Hgb A1c MFr Bld: 9.9 % — ABNORMAL HIGH (ref 4.8–5.6)
Mean Plasma Glucose: 237.43 mg/dL

## 2019-05-23 LAB — FERRITIN: Ferritin: 969 ng/mL — ABNORMAL HIGH (ref 24–336)

## 2019-05-23 LAB — C-REACTIVE PROTEIN: CRP: <0.8 mg/dL (ref <1.0)

## 2019-05-23 MED ORDER — QUETIAPINE FUMARATE 25 MG PO TABS
25.0000 mg | ORAL_TABLET | Freq: Every day | ORAL | Status: DC
  Administered 2019-05-23: 25 mg via ORAL
  Filled 2019-05-23: qty 1

## 2019-05-23 MED ORDER — INSULIN GLARGINE 100 UNIT/ML ~~LOC~~ SOLN
35.0000 [IU] | Freq: Two times a day (BID) | SUBCUTANEOUS | Status: DC
  Administered 2019-05-23 (×2): 35 [IU] via SUBCUTANEOUS
  Filled 2019-05-23 (×3): qty 0.35

## 2019-05-23 MED ORDER — FUROSEMIDE 10 MG/ML IJ SOLN
60.0000 mg | Freq: Once | INTRAMUSCULAR | Status: AC
  Administered 2019-05-23: 60 mg via INTRAVENOUS
  Filled 2019-05-23: qty 6

## 2019-05-23 MED ORDER — METHYLPREDNISOLONE SODIUM SUCC 125 MG IJ SOLR
50.0000 mg | Freq: Every day | INTRAMUSCULAR | Status: AC
  Administered 2019-05-23 – 2019-05-25 (×3): 50 mg via INTRAVENOUS
  Filled 2019-05-23 (×3): qty 2

## 2019-05-23 MED ORDER — INSULIN ASPART 100 UNIT/ML ~~LOC~~ SOLN
6.0000 [IU] | SUBCUTANEOUS | Status: DC
  Administered 2019-05-23 (×4): 6 [IU] via SUBCUTANEOUS

## 2019-05-23 MED ORDER — GLUCERNA 1.2 CAL PO LIQD
1000.0000 mL | ORAL | Status: DC
  Administered 2019-05-23 – 2019-05-28 (×5): 1000 mL
  Filled 2019-05-23 (×7): qty 1000

## 2019-05-23 MED ORDER — JUVEN PO PACK
1.0000 | PACK | Freq: Two times a day (BID) | ORAL | Status: DC
  Administered 2019-05-23 – 2019-06-09 (×31): 1
  Filled 2019-05-23 (×29): qty 1

## 2019-05-23 MED ORDER — PRO-STAT SUGAR FREE PO LIQD
60.0000 mL | Freq: Three times a day (TID) | ORAL | Status: DC
  Administered 2019-05-23 – 2019-05-28 (×16): 60 mL
  Filled 2019-05-23 (×15): qty 60

## 2019-05-23 NOTE — Progress Notes (Signed)
Spoke with pt grand daughter, Steward Drone and gave updates. Also set up a FaceTime video for [place].

## 2019-05-23 NOTE — Progress Notes (Signed)
PROGRESS NOTE    Lance Mason  WUJ:[id number] DOB: 06/20/1956 DOA: 04/21/2019 PCP: Patient, No Pcp Per      Brief Narrative:  [name] is a 63 y.o. M with DM, obesity, and HTN who presented with 1 week shortness of breath and cough, then fever and worsening SOB.  In the ER, SpO2 84%, required non-rebreather to maintain O2 sat >90%.  CXR with bilateral opacities.    Intubated after 3 days.     Assessment & Plan:  Coronavirus pneumonitis with acute hypoxic respiratory failure In setting of ongoing 2020 COVID-19 pandemic.  Completed azithromycin 8 days, cefepime 7 days Convalescent plasma on 5/29 S/p Actemra on 5/27 S/p remdesivir 5 days, 5/27 to 5/31  Inflammatory markers still trending down.  TVs >600 on SBT today, Pulm will repeat ABG, consider paralysis.  -Continue tapered steroids 2 more days then stop -Continue Lovenox for VTE PPx  -Continue daily d-dimer, ferritin and CRP -Continue scheduled bronchodilators -Repeat IV Lasix 60 daily -Close monitoring of K and renal function -Add nightly seroquel for sedation   COVID-19 Labs Recent Labs    05/21/19 0215 05/23/19 0220  DDIMER 5.36* 2.76*  FERRITIN 1,086*  970* 969*  LDH 547*  --   CRP 0.9  0.8 <0.8          Diabetes Still markedly hyperglycemic -Hold home metformin, glipizide -Continue Lantus, BID, increase dose again -Increase scheduled insulin -Discussed TFs with Nutrition, will change to glucerna -Continue SSI corrections q4hrs  Hypertension BP still low.  Not on home meds. -Continue Levophed  Oral candidiasis -Continue fluconazole, day 3 of 7  Hypernatremia Resolved      MDM and disposition: The below labs and imaging reports reviewed and summarized above.  Medication management as above.  The patient was admitted with acute on toxic respiratory failure from COVID-19.  He has had persistent acute metabolic encephalopathy, transaminitis, and severe  hyperglycemia requiring frequent insulin dosing.  This is associated with a high degree of medical risk, high complexity of medical decision making is required. 35 minutes was spent with the patient, face to face or on the floor, with greater than 50% of total time spent examining patient, counseling family by phone regarding his condition, and coordinating care.      Ventilator best practice:  Diet: TFs VAP protocol (if indicated): Yes GI prophylaxis: Famotidine Glucose control: Insulin  Mobility: N/A  DVT prophylaxis: Lovenox Code Status: FULL     Consultants:   PCCM  Procedures:   5/29 ETT  5/29 L subclavian central line  Antimicrobials:   Azithromycin 5/26 >> 6/2  Cefepime 5/27 >> 6/2  Fluconazole 6/2 >>  Remdesivir 5/27 >> 5/31  Vancomycin 5/26 >> 5/28   Culture data:   MRSA nares swab-negative on admission  5/26 blood culture x1-no growth to date  5/31 blood culture x2 --no growth to date  5/31 respiratory culture- normal respiratory flora       Subjective: Intubated and sedated.  Objective: Vitals:   05/23/19 0800 05/23/19 0900 05/23/19 0919 05/23/19 1000  BP: 105/65 107/67  113/64  Pulse: 81 84  84  Resp: 14 13  (!) 26  Temp: 98.5 F (36.9 C)     TempSrc: Axillary     SpO2: 93% 91% 94% 94%  Weight:      Height:        Intake/Output Summary (Last 24 hours) at 05/23/2019 1029 Last data filed at 05/23/2019 1000 Gross per 24 hour  Intake 481.41 ml  Output 3990 ml  Net -3508.59 ml   Filed Weights   12-Jun-2019 2354 05/20/19 0500  Weight: 105.5 kg 105.6 kg    Examination: General appearance: Obese adult male, intubated and sedated. HEENT: Anicteric, conjunctival pink, lids lashes normal.  No nasal deformity, discharge, or epistaxis. Skin: Warm and dry, no suspicious rashes or lesions. Cardiac: Tachycardic, regular, no murmurs appreciated, JVP not visible due to body habitus, extremities cool but not mottled, peripheral pulses  diminished, diffuse edema, nonpitting.     Respiratory: Intubated, prolonged expiratory phase bilaterally, diminished bilaterally. Abdomen: Abdomen soft, no grimace to palpation, no ascites or distention. Neuro/Psych: Sedated, pupils equal, does not make any purposeful movements.    Data Reviewed: I have personally reviewed following labs and imaging studies:  CBC: Recent Labs  Lab 05/17/19 0125  05/18/19 0100  05/19/19 0400  05/20/19 0425 05/21/19 0215 05/22/19 0150 05/23/19 0220 05/23/19 0949  WBC 9.4  --  13.6*  --  18.7*  --   --  12.3* 10.9* 14.2*  --   NEUTROABS 8.3*  --  12.2*  --  14.4*  --   --   --   --   --   --   HGB 14.8   < > 12.3*   < > 14.5   < > 12.9* 12.8* 12.9* 13.5 12.2*  HCT 44.8   < > 37.8*   < > 44.0   < > 38.0* 39.2 39.9 40.7 36.0*  MCV 91.8  --  95.0  --  95.2  --   --  95.4 93.4 93.3  --   PLT 316  --  230  --  361  --   --  220 215 213  --    < > = values in this interval not displayed.   Basic Metabolic Panel: Recent Labs  Lab 05/17/19 0125  05/18/19 0100  05/19/19 0400  05/20/19 0230 05/20/19 0425 05/21/19 0215 05/22/19 0150 05/23/19 0220 05/23/19 0949  NA 141   < > 150*   < > 149*   < > 141 144 138 136 135 133*  K 3.8   < > 4.2   < > 3.7   < > 4.3 4.3 4.4 4.0 4.0 3.7  CL 103  --  116*  --  112*  --  101  --  97* 93* 91*  --   CO2 24  --  24  --  25  --  32  --  30 33* 31  --   GLUCOSE 252*  --  164*  --  165*  --  156*  --  245* 365* 294*  --   BUN 19  --  37*  --  34*  --  26*  --  35* 39* 34*  --   CREATININE 0.79  --  1.04  --  0.90  --  0.61  --  0.66 0.64 0.58*  --   CALCIUM 8.1*  --  7.9*  --  8.0*  --  7.9*  --  8.3* 8.0* 8.5*  --   MG 2.5*  --  2.8*  --  2.6*  --   --   --   --   --   --   --   PHOS 2.3*  --  3.1  --  2.8  --   --   --   --   --   --   --    < > =  values in this interval not displayed.   GFR: Estimated Creatinine Clearance: 105.7 mL/min (A) (by C-G formula based on SCr of 0.58 mg/dL (L)). Liver Function  Tests: Recent Labs  Lab 05/19/19 0400 05/20/19 0230 05/21/19 0215 05/22/19 0150 05/23/19 0220  AST 63* 53* 44* 64* 55*  ALT 79* 60* 57* 69* 71*  ALKPHOS 227* 150* 130* 124 123  BILITOT 0.1* 0.2* 0.4 0.6 0.6  PROT 7.4 6.0* 5.8* 6.2* 6.2*  ALBUMIN 3.1* 2.6* 2.5* 2.7* 2.9*   No results for input(s): LIPASE, AMYLASE in the last 168 hours. No results for input(s): AMMONIA in the last 168 hours. Coagulation Profile: No results for input(s): INR, PROTIME in the last 168 hours. Cardiac Enzymes: Recent Labs  Lab 05/17/19 0125 05/18/19 0100 05/19/19 0400 05/19/19 1040  CKTOTAL 115 88 498*  --   TROPONINI  --   --   --  0.06*   BNP (last 3 results) No results for input(s): PROBNP in the last 8760 hours. HbA1C: Recent Labs    05/23/19 0220  HGBA1C 9.9*   CBG: Recent Labs  Lab 05/22/19 1625 05/22/19 1930 05/22/19 2351 05/23/19 0354 05/23/19 0756  GLUCAP 336* 294* 275* 326* 260*   Lipid Profile: No results for input(s): CHOL, HDL, LDLCALC, TRIG, CHOLHDL, LDLDIRECT in the last 72 hours. Thyroid Function Tests: No results for input(s): TSH, T4TOTAL, FREET4, T3FREE, THYROIDAB in the last 72 hours. Anemia Panel: Recent Labs    05/21/19 0215 05/23/19 0220  FERRITIN 1,086*  970* 969*   Urine analysis:    Component Value Date/Time   COLORURINE YELLOW 05/28/2016 2056   APPEARANCEUR CLEAR 05/28/2016 2056   LABSPEC 1.041 (H) 05/28/2016 2056   PHURINE 5.0 05/28/2016 2056   GLUCOSEU >1000 (A) 05/28/2016 2056   HGBUR NEGATIVE 05/28/2016 2056   BILIRUBINUR negative 06/19/2018 1601   KETONESUR negative 06/19/2018 1601   KETONESUR 15 (A) 05/28/2016 2056   PROTEINUR negative 06/19/2018 1601   PROTEINUR NEGATIVE 05/28/2016 2056   UROBILINOGEN 0.2 06/19/2018 1601   NITRITE Negative 06/19/2018 1601   NITRITE NEGATIVE 05/28/2016 2056   LEUKOCYTESUR Negative 06/19/2018 1601   Sepsis Labs: (procalcitonin:4,lacticacidven:4)  ) Recent Results (from the past 240  hour(s))  Culture, blood (Routine X 2) w Reflex to ID Panel     Status: None   Collection Time: June 07, 2019  8:18 PM  Result Value Ref Range Status   Specimen Description   Final    BLOOD BLOOD LEFT FOREARM Performed at [facility], [address], [place], [place] [address]    Special Requests   Final    BOTTLES DRAWN AEROBIC AND ANAEROBIC Blood Culture adequate volume Performed at [facility], [address], [place], [place] [address]    Culture   Final    NO GROWTH 5 DAYS Performed at [facility], [address], [place], [place] [address]    Report Status 05/19/2019 FINAL  Final  MRSA PCR Screening     Status: None   Collection Time: 05/15/19  1:55 PM  Result Value Ref Range Status   MRSA by PCR NEGATIVE NEGATIVE Final    Comment:        The GeneXpert MRSA Assay (FDA approved for NASAL specimens only), is one component of a comprehensive MRSA colonization surveillance program. It is not intended to diagnose MRSA infection nor to guide or monitor treatment for MRSA infections. Performed at [facility], [address], [place], [place] [address]   Culture,  respiratory (non-expectorated)     Status: None   Collection Time: 05/19/19  9:44 AM  Result Value Ref Range Status   Specimen Description   Final    TRACHEAL ASPIRATE Performed at [facility], [address] [place], [place] [address]    Special Requests   Final    NONE Performed at [facility], [address], [place], [place] [address]    Gram Stain   Final    RARE WBC PRESENT, PREDOMINANTLY PMN RARE GRAM POSITIVE COCCI    Culture   Final    FEW Consistent with normal respiratory flora. Performed at [facility], [address] [place], [place] [address]    Report Status 05/21/2019 FINAL  Final  Culture, blood (Routine X 2) w Reflex to ID Panel     Status: None (Preliminary result)   Collection  Time: 05/19/19  9:44 AM  Result Value Ref Range Status   Specimen Description   Final    RIGHT ANTECUBITAL Performed at [facility], [address] [place], [place] [address]    Special Requests   Final    BOTTLES DRAWN AEROBIC ONLY Blood Culture adequate volume Performed at [facility], [address], [place], [place] [address]    Culture   Final    NO GROWTH 4 DAYS Performed at [facility], [address] [place], [place] [address]    Report Status PENDING  Incomplete  Culture, blood (Routine X 2) w Reflex to ID Panel     Status: None (Preliminary result)   Collection Time: 05/19/19  9:49 AM  Result Value Ref Range Status   Specimen Description   Final    BLOOD RIGHT HAND Performed at [facility], [address] [place], [place] [address]    Special Requests   Final    BOTTLES DRAWN AEROBIC ONLY Blood Culture adequate volume Performed at [facility], [address], [place], [place] [address]    Culture   Final    NO GROWTH 4 DAYS Performed at [facility], [address] [place], [place] [address]    Report Status PENDING  Incomplete         Radiology Studies: No results found.      Scheduled Meds: . chlorhexidine  15 mL Mouth/Throat BID  . Chlorhexidine Gluconate Cloth  6 each Topical q morning - 10a  . enoxaparin (LOVENOX) injection  50 mg Subcutaneous Q12H  . famotidine  20 mg Oral BID  . free water  250 mL Per Tube Q4H  . guaiFENesin-dextromethorphan  15 mL Oral Q6H  . insulin aspart  0-20 Units Subcutaneous Q4H  . insulin aspart  3 Units Subcutaneous Q4H  . insulin glargine  35 Units Subcutaneous BID  . ipratropium-albuterol  3 mL Nebulization Q6H  . mouth rinse  15 mL Mouth Rinse 10 times per day  . methylPREDNISolone (SOLU-MEDROL) injection  50 mg Intravenous Daily  .  morphine injection  2 mg Intravenous Once  . multivitamin with minerals  1 tablet Per  Tube Daily  . QUEtiapine  25 mg Oral QHS  . sodium chloride flush  10-40 mL Intracatheter Q12H   Continuous Infusions: . sodium chloride Stopped (05/22/19 1648)  . sodium chloride 100 mL/hr at 05/19/19 0400  . feeding supplement (VITAL HIGH PROTEIN) 1,000 mL (05/23/19 0710)  . fluconazole (DIFLUCAN) IV Stopped (05/22/19 1711)  . midazolam 6 mg/hr (05/23/19  1000)  . morphine 4 mg/hr (05/23/19 1000)     LOS: 9 days    Time spent:  35 minutes  During this encounter: Patient Isolation: Airborne, contact, droplet HCP PPE: Face shield, head covering, N95, gown, gloves, and shoe covers    Alberteen Samhristopher P Danford, MD [facility] 05/23/2019, 10:29 AM     Please page through AMION:  [email or web address] Password [email or web address] If 7PM-7AM, please contact night-coverage

## 2019-05-23 NOTE — Progress Notes (Signed)
NAME:  Lance Mason, MRN:  [id number], DOB:  17-Dec-1956, LOS: 9 ADMISSION DATE:  2019/02/18, CONSULTATION DATE:  05/15/2019 REFERRING MD:  Kerry HoughMemon, CHIEF COMPLAINT:  Dyspnea   Brief History   63 year old male with diabetes type 2, hypertension admitted on May 26 with acute respiratory failure with hypoxemia due to COVID-19 pneumonia.  Past Medical History  Hypertension Hyperlipidemia DM 2  Significant Hospital Events   May 26 admission May 29 intubation, mechanical ventilation May 31 worsening dyssynchrony, hypoxemia, added vecuronium June 1 extubated overnight   Consults:  Pulmonary and critical care medicine  Procedures:  Endotracheal tube May 29 L subclavian CVL May 29 >   Significant Diagnostic Tests:  CT angiogram chest May 27 diffuse bilateral patchy airspace disease groundglass upper lobes predominant, no pulmonary embolism June 1 bilateral lower ext doppler > neg DVT June 1 TTE >>>  Micro Data:  May 21 SARS-CoV-2 positive May 26 blood cultures  Antimicrobials/COVID treatment  May 26 azithromycin May 27 cefepime May 27 vancomycin May 27 remdesivir May 27 Tocilizumab  Interim history/subjective:  Excellent diuresis  Objective   Blood pressure 117/67, pulse 79, temperature 98.4 F (36.9 C), temperature source Axillary, resp. rate (!) 26, height 5\' 5"  (1.651 m), weight 105.6 kg, SpO2 93 %.    Vent Mode: PRVC FiO2 (%):  [60 %] 60 % Set Rate:  [28 bmp] 28 bmp Vt Set:  [490 mL] 490 mL PEEP:  [5 cmH20] 5 cmH20 Plateau Pressure:  [16 cmH20-21 cmH20] 17 cmH20   Intake/Output Summary (Last 24 hours) at 05/23/2019 1453 Last data filed at 05/23/2019 1300 Gross per 24 hour  Intake 2298.33 ml  Output 4750 ml  Net -2451.67 ml   Filed Weights   Jun 16, 2019 2354 05/20/19 0500  Weight: 105.5 kg 105.6 kg   Physical Exam: General: Critically ill-appearing, sedated HENT: [place], AT, ETT in place Eyes: EOMI, no scleral icterus Respiratory: Clear to auscultation  bilaterally.  No crackles, wheezing or rales Cardiovascular: RRR, -M/R/G, no JVD GI: BS+, soft, nontender Extremities:-Edema,-tenderness Neuro: Sedated Skin: Intact, no rashes or bruising GU: Foley in place  Resolved Hospital Problem list   Shock secondary to sedation  Assessment & Plan:  Acute respiratory failure with hypoxemia due to COVID-19: CFB +2.4L Moderate ARDS Completed Tocilizumab Completed Remdesivir Wean per ARDSnet protocol, increased TV 8cc/kg IBW Consider paralytics to maintain low-tidal volumes if needed RASS goal -3, continue morphine and midazolam infusions Scheduled bronchodilator Lasix 60 mg BID x 2 Titrate methylpred off VAP prevention protocol  Oral Thrush Fluconazole x 7 days (end date 6/8)  Elevated d-dimer: No evidence of clot Prophylactic heparin per COVID protocol   Hyperglycemia Per TRH  Mediastinal adenopathy: Likely reactive Repeat CT chest 3 months  Best practice:  Diet: tube feeding Pain/Anxiety/Delirium protocol (if indicated): yes, RASS score -3 VAP protocol (if indicated): yes DVT prophylaxis: Lovenox GI prophylaxis: PPI Glucose control: per TRH Mobility: bed rest Code Status: full Family Communication: per TRH Disposition: ICU  Labs   CBC: Recent Labs  Lab 05/17/19 0125  05/18/19 0100  05/19/19 0400  05/20/19 0425 05/21/19 0215 05/22/19 0150 05/23/19 0220 05/23/19 0949  WBC 9.4  --  13.6*  --  18.7*  --   --  12.3* 10.9* 14.2*  --   NEUTROABS 8.3*  --  12.2*  --  14.4*  --   --   --   --   --   --   HGB 14.8   < > 12.3*   < >  14.5   < > 12.9* 12.8* 12.9* 13.5 12.2*  HCT 44.8   < > 37.8*   < > 44.0   < > 38.0* 39.2 39.9 40.7 36.0*  MCV 91.8  --  95.0  --  95.2  --   --  95.4 93.4 93.3  --   PLT 316  --  230  --  361  --   --  220 215 213  --    < > = values in this interval not displayed.    Basic Metabolic Panel: Recent Labs  Lab 05/17/19 0125  05/18/19 0100  05/19/19 0400  05/20/19 0230 05/20/19 0425  05/21/19 0215 05/22/19 0150 05/23/19 0220 05/23/19 0949  NA 141   < > 150*   < > 149*   < > 141 144 138 136 135 133*  K 3.8   < > 4.2   < > 3.7   < > 4.3 4.3 4.4 4.0 4.0 3.7  CL 103  --  116*  --  112*  --  101  --  97* 93* 91*  --   CO2 24  --  24  --  25  --  32  --  30 33* 31  --   GLUCOSE 252*  --  164*  --  165*  --  156*  --  245* 365* 294*  --   BUN 19  --  37*  --  34*  --  26*  --  35* 39* 34*  --   CREATININE 0.79  --  1.04  --  0.90  --  0.61  --  0.66 0.64 0.58*  --   CALCIUM 8.1*  --  7.9*  --  8.0*  --  7.9*  --  8.3* 8.0* 8.5*  --   MG 2.5*  --  2.8*  --  2.6*  --   --   --   --   --   --   --   PHOS 2.3*  --  3.1  --  2.8  --   --   --   --   --   --   --    < > = values in this interval not displayed.   GFR: Estimated Creatinine Clearance: 105.7 mL/min (A) (by C-G formula based on SCr of 0.58 mg/dL (L)). Recent Labs  Lab 05/19/19 0400 05/19/19 1040 05/20/19 0230 05/21/19 0215 05/22/19 0150 05/23/19 0220  PROCALCITON  --  0.65 0.29 0.17  --   --   WBC 18.7*  --   --  12.3* 10.9* 14.2*    Liver Function Tests: Recent Labs  Lab 05/19/19 0400 05/20/19 0230 05/21/19 0215 05/22/19 0150 05/23/19 0220  AST 63* 53* 44* 64* 55*  ALT 79* 60* 57* 69* 71*  ALKPHOS 227* 150* 130* 124 123  BILITOT 0.1* 0.2* 0.4 0.6 0.6  PROT 7.4 6.0* 5.8* 6.2* 6.2*  ALBUMIN 3.1* 2.6* 2.5* 2.7* 2.9*   No results for input(s): LIPASE, AMYLASE in the last 168 hours. No results for input(s): AMMONIA in the last 168 hours.  ABG    Component Value Date/Time   PHART 7.484 (H) 05/23/2019 0949   PCO2ART 48.1 (H) 05/23/2019 0949   PO2ART 69.0 (L) 05/23/2019 0949   HCO3 36.1 (H) 05/23/2019 0949   TCO2 38 (H) 05/23/2019 0949   ACIDBASEDEF 3.0 (H) 05/18/2019 1601   O2SAT 94.0 05/23/2019 0949     Coagulation Profile: No results for input(s): INR, PROTIME  in the last 168 hours.  Cardiac Enzymes: Recent Labs  Lab 05/17/19 0125 05/18/19 0100 05/19/19 0400 05/19/19 1040  CKTOTAL  115 88 498*  --   TROPONINI  --   --   --  0.06*    HbA1C: Hemoglobin A1C  Date/Time Value Ref Range Status  06/19/2018 04:01 PM 9.1 (A) 4.0 - 5.6 % Final  01/02/2017 06:51 PM 12.8  Final   Hgb A1c MFr Bld  Date/Time Value Ref Range Status  05/23/2019 02:20 AM 9.9 (H) 4.8 - 5.6 % Final    Comment:    (NOTE) Pre diabetes:          5.7%-6.4% Diabetes:              >6.4% Glycemic control for   <7.0% adults with diabetes     CBG: Recent Labs  Lab 05/22/19 1930 05/22/19 2351 05/23/19 0354 05/23/19 0756 05/23/19 1158  GLUCAP 294* 275* 326* 260* 144*     Critical care time: 35 min    The patient is critically ill with multiple organ systems failure and requires high complexity decision making for assessment and support, frequent evaluation and titration of therapies, application of advanced monitoring technologies and extensive interpretation of multiple databases.   Discussed and co-managed patient care with PCCM-Hospitalist. Coordinated care with RT, RN and pharmacist.  Mechele Collin, M.D. [facility] Pulmonary/Critical Care Medicine Pager: [phone] After hours pager: [phone]

## 2019-05-23 NOTE — Progress Notes (Signed)
Nurse attempted to telephone Steward Drone for update at 9:36, no answer, Steward Drone called back about 12:30, update provided and all questions answered.

## 2019-05-23 NOTE — Progress Notes (Signed)
Nutrition Follow-up  DOCUMENTATION CODES:   Obesity unspecified  INTERVENTION:   Change TF formula to Glucerna 1.2 at 30 ml/h  Pro-stat 60 ml TID  Provides 1464 kcal, 133 gm protein, 580 ml free water, 82 gm CHO daily  Juven BID via tube, each packet provides 80 calories, 8 grams of carbohydrate, 2.5  grams of protein (collagen), 7 grams of L-arginine and 7 grams of L-glutamine; supplement contains CaHMB, Vitamins C, E, B12 and Zinc to promote wound healing  NUTRITION DIAGNOSIS:   Increased nutrient needs related to acute illness(COVID-19) as evidenced by estimated needs.  Ongoing   GOAL:   Provide needs based on ASPEN/SCCM guidelines  Met with TF  MONITOR:   Vent status, Labs, Skin, I & O's  ASSESSMENT:   63 yo Spanish speaking male with PMH of DM, HTN, HLD who was admitted with SOB and cough x 1 week. COVID-19 positive on 5/21.  Patient remains intubated on ventilator support Temp (24hrs), Avg:98.3 F (36.8 C), Min:97.8 F (36.6 C), Max:98.9 F (37.2 C)    Patient with 2 new unstageable pressure injuries.  Labs reviewed. Sodium 130 (L), BUN 34 (H), creatinine 0.58 (L) CBG's: 326-260  Medications reviewed and include Lasix, Novolog, Lantus, Solumedrol, MVI.   Patient receiving Vital High Protein via OGT at 60 ml/h (1440 ml/day) to provide 1440 kcals, 126 gm protein, 1204 ml free water, & 161 gm of CHO daily. Also receiving free water flushes 250 ml every 4 hours.  Spoke with MD this morning regarding elevated glucose. Likely related to steroids and acute stress response. Insulin is being adjusted. Will change TF formula to Glucerna to see if that helps as well.   Diet Order:   Diet Order            Diet NPO time specified  Diet effective now              EDUCATION NEEDS:   No education needs have been identified at this time  Skin:  Skin Assessment: Skin Integrity Issues: Skin Integrity Issues:: Unstageable Unstageable: left and right  buttocks  Last BM:  6/4  Height:   Ht Readings from Last 1 Encounters:  05/06/2019 _0  (1.651 m)    Weight:   Wt Readings from Last 1 Encounters:  05/20/19 105.6 kg    Ideal Body Weight:  61.8 kg  BMI:  Body mass index is 38.74 kg/m.  Estimated Nutritional Needs:   Kcal:  1160-1480  Protein:  124 gm  Fluid:  2 L    Molli Barrows, RD, LDN, [place] Pager [phone] After Hours Pager [phone]

## 2019-05-24 ENCOUNTER — Other Ambulatory Visit: Payer: Self-pay

## 2019-05-24 DIAGNOSIS — R451 Restlessness and agitation: Secondary | ICD-10-CM

## 2019-05-24 LAB — COMPREHENSIVE METABOLIC PANEL
ALT: 93 U/L — ABNORMAL HIGH (ref 0–44)
AST: 64 U/L — ABNORMAL HIGH (ref 15–41)
Albumin: 2.7 g/dL — ABNORMAL LOW (ref 3.5–5.0)
Alkaline Phosphatase: 124 U/L (ref 38–126)
Anion gap: 10 (ref 5–15)
BUN: 39 mg/dL — ABNORMAL HIGH (ref 8–23)
CO2: 38 mmol/L — ABNORMAL HIGH (ref 22–32)
Calcium: 8.6 mg/dL — ABNORMAL LOW (ref 8.9–10.3)
Chloride: 90 mmol/L — ABNORMAL LOW (ref 98–111)
Creatinine, Ser: 0.6 mg/dL — ABNORMAL LOW (ref 0.61–1.24)
GFR calc Af Amer: >60 mL/min (ref >60)
GFR calc non Af Amer: >60 mL/min (ref >60)
Glucose, Bld: 139 mg/dL — ABNORMAL HIGH (ref 70–99)
Potassium: 3.7 mmol/L (ref 3.5–5.1)
Sodium: 138 mmol/L (ref 135–145)
Total Bilirubin: 0.4 mg/dL (ref 0.3–1.2)
Total Protein: 5.9 g/dL — ABNORMAL LOW (ref 6.5–8.1)

## 2019-05-24 LAB — POCT I-STAT 7, (LYTES, BLD GAS, ICA,H+H)
Acid-Base Excess: 15 mmol/L — ABNORMAL HIGH (ref 0.0–2.0)
Acid-Base Excess: 13 mmol/L — ABNORMAL HIGH (ref 0.0–2.0)
Bicarbonate: 40.3 mmol/L — ABNORMAL HIGH (ref 20.0–28.0)
Bicarbonate: 38.6 mmol/L — ABNORMAL HIGH (ref 20.0–28.0)
Calcium, Ion: 1.17 mmol/L (ref 1.15–1.40)
Calcium, Ion: 1.18 mmol/L (ref 1.15–1.40)
HCT: 40 % (ref 39.0–52.0)
HCT: 43 % (ref 39.0–52.0)
Hemoglobin: 13.6 g/dL (ref 13.0–17.0)
Hemoglobin: 14.6 g/dL (ref 13.0–17.0)
O2 Saturation: 93 %
O2 Saturation: 95 %
Patient temperature: 98.3
Potassium: 3.7 mmol/L (ref 3.5–5.1)
Potassium: 3.8 mmol/L (ref 3.5–5.1)
Sodium: 136 mmol/L (ref 135–145)
Sodium: 137 mmol/L (ref 135–145)
TCO2: 42 mmol/L — ABNORMAL HIGH (ref 22–32)
TCO2: 40 mmol/L — ABNORMAL HIGH (ref 22–32)
pCO2 arterial: 50 mmHg — ABNORMAL HIGH (ref 32.0–48.0)
pCO2 arterial: 53.3 mmHg — ABNORMAL HIGH (ref 32.0–48.0)
pH, Arterial: 7.514 — ABNORMAL HIGH (ref 7.350–7.450)
pH, Arterial: 7.468 — ABNORMAL HIGH (ref 7.350–7.450)
pO2, Arterial: 60 mmHg — ABNORMAL LOW (ref 83.0–108.0)
pO2, Arterial: 72 mmHg — ABNORMAL LOW (ref 83.0–108.0)

## 2019-05-24 LAB — CBC
HCT: 41 % (ref 39.0–52.0)
Hemoglobin: 13.4 g/dL (ref 13.0–17.0)
MCH: 30.7 pg (ref 26.0–34.0)
MCHC: 32.7 g/dL (ref 30.0–36.0)
MCV: 94 fL (ref 80.0–100.0)
Platelets: 185 10*3/uL (ref 150–400)
RBC: 4.36 MIL/uL (ref 4.22–5.81)
RDW: 13.4 % (ref 11.5–15.5)
WBC: 13.6 10*3/uL — ABNORMAL HIGH (ref 4.0–10.5)
nRBC: 0.1 % (ref 0.0–0.2)

## 2019-05-24 LAB — CULTURE, BLOOD (ROUTINE X 2)
Culture: NO GROWTH
Culture: NO GROWTH
Special Requests: ADEQUATE
Special Requests: ADEQUATE

## 2019-05-24 LAB — C-REACTIVE PROTEIN: CRP: <0.8 mg/dL (ref <1.0)

## 2019-05-24 LAB — GLUCOSE, CAPILLARY
Glucose-Capillary: 107 mg/dL — ABNORMAL HIGH (ref 70–99)
Glucose-Capillary: 73 mg/dL (ref 70–99)
Glucose-Capillary: 122 mg/dL — ABNORMAL HIGH (ref 70–99)
Glucose-Capillary: 185 mg/dL — ABNORMAL HIGH (ref 70–99)
Glucose-Capillary: 307 mg/dL — ABNORMAL HIGH (ref 70–99)
Glucose-Capillary: 349 mg/dL — ABNORMAL HIGH (ref 70–99)
Glucose-Capillary: 276 mg/dL — ABNORMAL HIGH (ref 70–99)

## 2019-05-24 LAB — D-DIMER, QUANTITATIVE: D-Dimer, Quant: 2.67 ug/mL-FEU — ABNORMAL HIGH (ref 0.00–0.50)

## 2019-05-24 LAB — FERRITIN: Ferritin: 934 ng/mL — ABNORMAL HIGH (ref 24–336)

## 2019-05-24 MED ORDER — QUETIAPINE FUMARATE 50 MG PO TABS
50.0000 mg | ORAL_TABLET | Freq: Every day | ORAL | Status: DC
  Administered 2019-05-24 – 2019-05-26 (×3): 50 mg via ORAL
  Filled 2019-05-24 (×3): qty 1

## 2019-05-24 MED ORDER — INSULIN GLARGINE 100 UNIT/ML ~~LOC~~ SOLN
30.0000 [IU] | Freq: Two times a day (BID) | SUBCUTANEOUS | Status: DC
  Filled 2019-05-24: qty 0.3

## 2019-05-24 MED ORDER — INSULIN GLARGINE 100 UNIT/ML ~~LOC~~ SOLN
20.0000 [IU] | Freq: Two times a day (BID) | SUBCUTANEOUS | Status: DC
  Administered 2019-05-24 (×2): 20 [IU] via SUBCUTANEOUS
  Filled 2019-05-24 (×3): qty 0.2

## 2019-05-24 MED ORDER — ADULT MULTIVITAMIN LIQUID CH
15.0000 mL | Freq: Every day | ORAL | Status: DC
  Administered 2019-05-24 – 2019-05-28 (×5): 15 mL via ORAL
  Filled 2019-05-24 (×5): qty 15

## 2019-05-24 MED ORDER — INSULIN ASPART 100 UNIT/ML ~~LOC~~ SOLN
4.0000 [IU] | SUBCUTANEOUS | Status: DC
  Administered 2019-05-24 – 2019-05-25 (×5): 4 [IU] via SUBCUTANEOUS

## 2019-05-24 MED ORDER — FUROSEMIDE 10 MG/ML IJ SOLN
60.0000 mg | Freq: Once | INTRAMUSCULAR | Status: AC
  Administered 2019-05-24: 60 mg via INTRAVENOUS
  Filled 2019-05-24: qty 6

## 2019-05-24 NOTE — Progress Notes (Addendum)
NAME:  Lance Mason, MRN:  [id number], DOB:  1956/01/28, LOS: 10 ADMISSION DATE:  04/22/2019, CONSULTATION DATE:  05/15/2019 REFERRING MD:  Kerry Hough, CHIEF COMPLAINT:  Dyspnea   Brief History   63 year old male with diabetes type 2, hypertension admitted on May 26 with acute respiratory failure with hypoxemia due to COVID-19 pneumonia.  Past Medical History  Hypertension Hyperlipidemia DM 2  Significant Hospital Events   May 26 admission May 29 intubation, mechanical ventilation May 31 worsening dyssynchrony, hypoxemia, added vecuronium June 1 extubated overnight   Consults:  Pulmonary and critical care medicine  Procedures:  Endotracheal tube May 29 L subclavian CVL May 29 >   Significant Diagnostic Tests:  CT angiogram chest May 27 diffuse bilateral patchy airspace disease groundglass upper lobes predominant, no pulmonary embolism June 1 bilateral lower ext doppler > neg DVT June 1 TTE >>>  Micro Data:  May 21 SARS-CoV-2 positive May 26 blood cultures  Antimicrobials/COVID treatment  May 26 azithromycin May 27 cefepime May 27 vancomycin May 27 remdesivir May 27 Tocilizumab May 29 Convalescent plasma   Interim history/subjective:  Weaned FIO2 and PEEP to 50% and 5, respectively. Pplat <30. Excellent UOP  Objective   Blood pressure 112/65, pulse 99, temperature 98.5 F (36.9 C), temperature source Axillary, resp. rate (!) 22, height 5\' 5"  (1.651 m), weight 105.6 kg, SpO2 91 %. CVP:  [4 mmHg-12 mmHg] 7 mmHg  Vent Mode: PRVC FiO2 (%):  [50 %-60 %] 50 % Set Rate:  [24 bmp-28 bmp] 24 bmp Vt Set:  [490 mL] 490 mL PEEP:  [5 cmH20] 5 cmH20 Plateau Pressure:  [12 cmH20-28 cmH20] 25 cmH20   Intake/Output Summary (Last 24 hours) at 05/24/2019 1037 Last data filed at 05/24/2019 1000 Gross per 24 hour  Intake 1188.5 ml  Output 5035 ml  Net -3846.5 ml   Filed Weights   05/04/2019 2354 05/20/19 0500  Weight: 105.5 kg 105.6 kg   Physical Exam: General: Critically  ill-appearing, sedated HENT: [place], AT, ETT in place Eyes: EOMI, no scleral icterus Respiratory: Clear to auscultation bilaterally.  No crackles, wheezing or rales Cardiovascular: RRR, -M/R/G, no JVD GI: BS+, soft, nontender Extremities:-Edema,-tenderness Neuro: Sedated GU: Foley in place  Resolved Hospital Problem list   Shock secondary to sedation  Assessment & Plan:  Acute respiratory failure with hypoxemia due to COVID-19: CFB +2.4L Moderate ARDS Completed Tocilizumab Completed Remdesivir Reviewed ABG. Reduced RR and FIO2. Wean per ARDSnet protocol, TV 8cc/kg IBW. RASS goal -3, continue morphine and midazolam infusions Scheduled bronchodilator Lasix 60 mg x 1 Titrate steroids off VAP prevention protocol  Sedation on mechanical ventilation Wean Morphine gtt Continue Seroquel If agitated, consider adding Precedex RASS goal 0 and -1  Oral Thrush Fluconazole x 7 days (end date 6/8)  Elevated d-dimer: No evidence of clot Prophylactic heparin per COVID protocol   Hyperglycemia Per TRH  Mediastinal adenopathy: Likely reactive Repeat CT chest 3 months  Best practice:  Diet: tube feeding Pain/Anxiety/Delirium protocol (if indicated): yes, RASS score 0 and -1 VAP protocol (if indicated): yes DVT prophylaxis: Lovenox GI prophylaxis: PPI Glucose control: per TRH Mobility: bed rest Code Status: full Family Communication: per TRH Disposition: ICU  Labs   CBC: Recent Labs  Lab 05/18/19 0100  05/19/19 0400  05/21/19 0215 05/22/19 0150 05/23/19 0220 05/23/19 0949 05/23/19 1603 05/24/19 0130 05/24/19 0805  WBC 13.6*  --  18.7*  --  12.3* 10.9* 14.2*  --   --  13.6*  --   NEUTROABS 12.2*  --  14.4*  --   --   --   --   --   --   --   --   HGB 12.3*   < > 14.5   < > 12.8* 12.9* 13.5 12.2* 12.9* 13.4 13.6  HCT 37.8*   < > 44.0   < > 39.2 39.9 40.7 36.0* 38.0* 41.0 40.0  MCV 95.0  --  95.2  --  95.4 93.4 93.3  --   --  94.0  --   PLT 230  --  361  --  220 215 213   --   --  185  --    < > = values in this interval not displayed.    Basic Metabolic Panel: Recent Labs  Lab 05/18/19 0100  05/19/19 0400  05/20/19 0230  05/21/19 0215 05/22/19 0150 05/23/19 0220 05/23/19 0949 05/23/19 1603 05/24/19 0130 05/24/19 0805  NA 150*   < > 149*   < > 141   < > 138 136 135 133* 135 138 136  K 4.2   < > 3.7   < > 4.3   < > 4.4 4.0 4.0 3.7 4.6 3.7 3.7  CL 116*  --  112*  --  101  --  97* 93* 91*  --   --  90*  --   CO2 24  --  25  --  32  --  30 33* 31  --   --  38*  --   GLUCOSE 164*  --  165*  --  156*  --  245* 365* 294*  --   --  139*  --   BUN 37*  --  34*  --  26*  --  35* 39* 34*  --   --  39*  --   CREATININE 1.04  --  0.90  --  0.61  --  0.66 0.64 0.58*  --   --  0.60*  --   CALCIUM 7.9*  --  8.0*  --  7.9*  --  8.3* 8.0* 8.5*  --   --  8.6*  --   MG 2.8*  --  2.6*  --   --   --   --   --   --   --   --   --   --   PHOS 3.1  --  2.8  --   --   --   --   --   --   --   --   --   --    < > = values in this interval not displayed.   GFR: Estimated Creatinine Clearance: 105.7 mL/min (A) (by C-G formula based on SCr of 0.6 mg/dL (L)). Recent Labs  Lab 05/19/19 1040 05/20/19 0230 05/21/19 0215 05/22/19 0150 05/23/19 0220 05/24/19 0130  PROCALCITON 0.65 0.29 0.17  --   --   --   WBC  --   --  12.3* 10.9* 14.2* 13.6*    Liver Function Tests: Recent Labs  Lab 05/20/19 0230 05/21/19 0215 05/22/19 0150 05/23/19 0220 05/24/19 0130  AST 53* 44* 64* 55* 64*  ALT 60* 57* 69* 71* 93*  ALKPHOS 150* 130* 124 123 124  BILITOT 0.2* 0.4 0.6 0.6 0.4  PROT 6.0* 5.8* 6.2* 6.2* 5.9*  ALBUMIN 2.6* 2.5* 2.7* 2.9* 2.7*   No results for input(s): LIPASE, AMYLASE in the last 168 hours. No results for input(s): AMMONIA in the last 168 hours.  ABG  Component Value Date/Time   PHART 7.514 (H) 05/24/2019 0805   PCO2ART 50.0 (H) 05/24/2019 0805   PO2ART 60.0 (L) 05/24/2019 0805   HCO3 40.3 (H) 05/24/2019 0805   TCO2 42 (H) 05/24/2019 0805    ACIDBASEDEF 3.0 (H) 05/18/2019 1601   O2SAT 93.0 05/24/2019 0805     Coagulation Profile: No results for input(s): INR, PROTIME in the last 168 hours.  Cardiac Enzymes: Recent Labs  Lab 05/18/19 0100 05/19/19 0400 05/19/19 1040  CKTOTAL 88 498*  --   TROPONINI  --   --  0.06*    HbA1C: Hemoglobin A1C  Date/Time Value Ref Range Status  06/19/2018 04:01 PM 9.1 (A) 4.0 - 5.6 % Final  01/02/2017 06:51 PM 12.8  Final   Hgb A1c MFr Bld  Date/Time Value Ref Range Status  05/23/2019 02:20 AM 9.9 (H) 4.8 - 5.6 % Final    Comment:    (NOTE) Pre diabetes:          5.7%-6.4% Diabetes:              >6.4% Glycemic control for   <7.0% adults with diabetes     CBG: Recent Labs  Lab 05/23/19 1158 05/23/19 1558 05/23/19 1917 05/23/19 2319 05/24/19 0357  GLUCAP 144* 282* 315* 167* 107*     Critical care time: 32 min    The patient is critically ill with multiple organ systems failure and requires high complexity decision making for assessment and support, frequent evaluation and titration of therapies, application of advanced monitoring technologies and extensive interpretation of multiple databases.   Discussed and co-managed patient care with PCCM-Hospitalist. Coordinated care with RT, RN and pharmacist.  Mechele CollinJane , M.D. [facility] Pager: [phone] After hours pager: [phone]

## 2019-05-24 NOTE — Progress Notes (Signed)
Spoke with patient's grand daughter. Updated on plan of care and patient's status.

## 2019-05-24 NOTE — Progress Notes (Signed)
Update called to Steward Drone- granddaughter via phone

## 2019-05-24 NOTE — Progress Notes (Signed)
PROGRESS NOTE    Lance Mason  HWK:[id number] DOB: [birth year] DOA: 05/09/2019 PCP: Patient, No Pcp Per      Brief Narrative:  [name] is a 63 y.o. M with DM, obesity, and HTN who presented with 1 week shortness of breath and cough, then fever and worsening SOB.  In the ER, SpO2 84%, required non-rebreather to maintain O2 sat >90%.  CXR with bilateral opacities.    Intubated after 3 days.     Assessment & Plan:  Coronavirus pneumonitis with acute hypoxic respiratory failure In setting of ongoing 2020 COVID-19 pandemic.  Completed azithromycin 8 days, cefepime 7 days Convalescent plasma on 5/29 S/p Actemra on 5/27 S/p remdesivir 5 days, 5/27 to 5/31  Inflammatory markers no change.   Appears to be weaning from ventilator well.  -Continue steroids, 60 mg Solu-Medrol daily, and date tomorrow -Continue Lovenox for VTE PPx  -Continue daily d-dimer, ferritin and CRP -Continue scheduled bronchodilators -Reduce Lasix, Lasix 60 mg IV once today -Continue close monitoring of K and renal function -Continue nightly seroquel for sedation   COVID-19 Labs Recent Labs    05/23/19 0220 05/24/19 0130  DDIMER 2.76* 2.67*  FERRITIN 969* 934*  CRP <0.8 <0.8            Diabetes Glucose 70 this morning. -Hold home metformin, glipizide -Reduce Lantus, BID  -Reduce scheduled insulin -Continue sliding scale corrections   Hypertension BP soft.  Not on home meds.  Oral candidiasis -Continue fluconazole, day 4 of 7  Hypernatremia Resolved      MDM and disposition: The below labs and imaging reports were reviewed and summarized above.  Medication management as above.  The patient was admitted with acute on toxic respiratory failure from COVID-19.  He has improving acute metabolic encephalopathy, persistent transaminitis, and severe hyperglycemia, requiring frequent adjustment of insulin dosing.  This encounter is associated with a high degree of  medical risk, high complexity medical decision making.  35 minutes was spent with the patient, face-to-face around the floor with greater than 50% of total time spent examining the patient, counseling family via phone regarding his condition, and coordinating care.       Ventilator best practice:  Diet: TFs VAP protocol (if indicated): Yes GI prophylaxis: Famotidine Glucose control: Insulin  Mobility: N/A  DVT prophylaxis: Lovenox Code Status: FULL     Consultants:   PCCM  Procedures:   5/29 ETT  5/29 L subclavian central line  Antimicrobials:   Azithromycin 5/26 >> 6/2  Cefepime 5/27 >> 6/2  Fluconazole 6/2 >>  Remdesivir 5/27 >> 5/31  Vancomycin 5/26 >> 5/28   Culture data:   MRSA nares swab-negative on admission  5/26 blood culture x1-no growth to date  5/31 blood culture x2 --no growth to date  5/31 respiratory culture- normal respiratory flora       Subjective: Intubated and sedated.  More awake today.  Opens eyes to nursing cares.  Objective: Vitals:   05/24/19 0800 05/24/19 0904 05/24/19 1137 05/24/19 1200  BP:  112/65 114/72   Pulse:  99 95   Resp:  (!) 22 (!) 25   Temp: 98.5 F (36.9 C)   98.4 F (36.9 C)  TempSrc: Axillary   Axillary  SpO2:  91% 95%   Weight:      Height:        Intake/Output Summary (Last 24 hours) at 05/24/2019 1345 Last data filed at 05/24/2019 1300 Gross per 24 hour  Intake 832.62 ml  Output 4820 ml  Net -3987.38 ml   Filed Weights   2019/06/01 2354 05/20/19 0500  Weight: 105.5 kg 105.6 kg    Examination: General appearance: Overweight adult male, intubated and sedated. HEENT: Anicteric, conjunctival pink, lids lashes normal.  No nasal deformity, discharge, or epistaxis. Skin: Warm and dry, no suspicious rashes or lesions. Cardiac: Tachycardic, regular, no murmurs, JVP not visible due to body habitus, no lower extremity edema.    Respiratory: Debated, lung sounds coarse bilaterally, diminished.  Abdomen: Abdomen soft, opens eyes to palpation of the abdomen, but no focal tenderness, no distention, rigidity. Neuro/Psych: Sedated, pupils equal, no purposeful movements.  Does not follow commands.    Data Reviewed: I have personally reviewed following labs and imaging studies:  CBC: Recent Labs  Lab 05/18/19 0100  05/19/19 0400  05/21/19 0215 05/22/19 0150 05/23/19 0220 05/23/19 0949 05/23/19 1603 05/24/19 0130 05/24/19 0805 05/24/19 1213  WBC 13.6*  --  18.7*  --  12.3* 10.9* 14.2*  --   --  13.6*  --   --   NEUTROABS 12.2*  --  14.4*  --   --   --   --   --   --   --   --   --   HGB 12.3*   < > 14.5   < > 12.8* 12.9* 13.5 12.2* 12.9* 13.4 13.6 14.6  HCT 37.8*   < > 44.0   < > 39.2 39.9 40.7 36.0* 38.0* 41.0 40.0 43.0  MCV 95.0  --  95.2  --  95.4 93.4 93.3  --   --  94.0  --   --   PLT 230  --  361  --  220 215 213  --   --  185  --   --    < > = values in this interval not displayed.   Basic Metabolic Panel: Recent Labs  Lab 05/18/19 0100  05/19/19 0400  05/20/19 0230  05/21/19 0215 05/22/19 0150 05/23/19 0220 05/23/19 0949 05/23/19 1603 05/24/19 0130 05/24/19 0805 05/24/19 1213  NA 150*   < > 149*   < > 141   < > 138 136 135 133* 135 138 136 137  K 4.2   < > 3.7   < > 4.3   < > 4.4 4.0 4.0 3.7 4.6 3.7 3.7 3.8  CL 116*  --  112*  --  101  --  97* 93* 91*  --   --  90*  --   --   CO2 24  --  25  --  32  --  30 33* 31  --   --  38*  --   --   GLUCOSE 164*  --  165*  --  156*  --  245* 365* 294*  --   --  139*  --   --   BUN 37*  --  34*  --  26*  --  35* 39* 34*  --   --  39*  --   --   CREATININE 1.04  --  0.90  --  0.61  --  0.66 0.64 0.58*  --   --  0.60*  --   --   CALCIUM 7.9*  --  8.0*  --  7.9*  --  8.3* 8.0* 8.5*  --   --  8.6*  --   --   MG 2.8*  --  2.6*  --   --   --   --   --   --   --   --   --   --   --  PHOS 3.1  --  2.8  --   --   --   --   --   --   --   --   --   --   --    < > = values in this interval not displayed.   GFR: Estimated  Creatinine Clearance: 105.7 mL/min (A) (by C-G formula based on SCr of 0.6 mg/dL (L)). Liver Function Tests: Recent Labs  Lab 05/20/19 0230 05/21/19 0215 05/22/19 0150 05/23/19 0220 05/24/19 0130  AST 53* 44* 64* 55* 64*  ALT 60* 57* 69* 71* 93*  ALKPHOS 150* 130* 124 123 124  BILITOT 0.2* 0.4 0.6 0.6 0.4  PROT 6.0* 5.8* 6.2* 6.2* 5.9*  ALBUMIN 2.6* 2.5* 2.7* 2.9* 2.7*   No results for input(s): LIPASE, AMYLASE in the last 168 hours. No results for input(s): AMMONIA in the last 168 hours. Coagulation Profile: No results for input(s): INR, PROTIME in the last 168 hours. Cardiac Enzymes: Recent Labs  Lab 05/18/19 0100 05/19/19 0400 05/19/19 1040  CKTOTAL 88 498*  --   TROPONINI  --   --  0.06*   BNP (last 3 results) No results for input(s): PROBNP in the last 8760 hours. HbA1C: Recent Labs    05/23/19 0220  HGBA1C 9.9*   CBG: Recent Labs  Lab 05/23/19 2319 05/24/19 0357 05/24/19 0746 05/24/19 1031 05/24/19 1222  GLUCAP 167* 107* 73 122* 185*   Lipid Profile: No results for input(s): CHOL, HDL, LDLCALC, TRIG, CHOLHDL, LDLDIRECT in the last 72 hours. Thyroid Function Tests: No results for input(s): TSH, T4TOTAL, FREET4, T3FREE, THYROIDAB in the last 72 hours. Anemia Panel: Recent Labs    05/23/19 0220 05/24/19 0130  FERRITIN 969* 934*   Urine analysis:    Component Value Date/Time   COLORURINE YELLOW 05/28/2016 2056   APPEARANCEUR CLEAR 05/28/2016 2056   LABSPEC 1.041 (H) 05/28/2016 2056   PHURINE 5.0 05/28/2016 2056   GLUCOSEU >1000 (A) 05/28/2016 2056   HGBUR NEGATIVE 05/28/2016 2056   BILIRUBINUR negative 06/19/2018 1601   KETONESUR negative 06/19/2018 1601   KETONESUR 15 (A) 05/28/2016 2056   PROTEINUR negative 06/19/2018 1601   PROTEINUR NEGATIVE 05/28/2016 2056   UROBILINOGEN 0.2 06/19/2018 1601   NITRITE Negative 06/19/2018 1601   NITRITE NEGATIVE 05/28/2016 2056   LEUKOCYTESUR Negative 06/19/2018 1601   Sepsis Labs:  @[email or web address] (procalcitonin:4,lacticacidven:4)  ) Recent Results (from the past 240 hour(s))  Culture, blood (Routine X 2) w Reflex to ID Panel     Status: None   Collection Time: Mar 04, 2019  8:18 PM  Result Value Ref Range Status   Specimen Description   Final    BLOOD BLOOD LEFT FOREARM Performed at [facility], [address] [place], [place] [address]    Special Requests   Final    BOTTLES DRAWN AEROBIC AND ANAEROBIC Blood Culture adequate volume Performed at [facility], [address] [place], [place] [address]    Culture   Final    NO GROWTH 5 DAYS Performed at [facility], [address] [place], [place] [address]    Report Status 05/19/2019 FINAL  Final  MRSA PCR Screening     Status: None   Collection Time: 05/15/19  1:55 PM  Result Value Ref Range Status   MRSA by PCR NEGATIVE NEGATIVE Final    Comment:        The GeneXpert MRSA Assay (FDA approved for NASAL specimens only), is one component of a comprehensive MRSA  colonization surveillance program. It is not intended to diagnose MRSA infection nor to guide or monitor treatment for MRSA infections. Performed at [facility], [address], [place], [place] [address]   Culture, respiratory (non-expectorated)     Status: None   Collection Time: 05/19/19  9:44 AM  Result Value Ref Range Status   Specimen Description   Final    TRACHEAL ASPIRATE Performed at [facility], [address], [place], [place] [address]    Special Requests   Final    NONE Performed at [facility], [address], [place], [place] [address]    Gram Stain   Final    RARE WBC PRESENT, PREDOMINANTLY PMN RARE GRAM POSITIVE COCCI    Culture   Final    FEW Consistent with normal respiratory flora. Performed at [facility], [address], [place], [place] [address]    Report Status 05/21/2019 FINAL  Final  Culture, blood  (Routine X 2) w Reflex to ID Panel     Status: None   Collection Time: 05/19/19  9:44 AM  Result Value Ref Range Status   Specimen Description   Final    RIGHT ANTECUBITAL Performed at [facility], [address] [place], [place] [address]    Special Requests   Final    BOTTLES DRAWN AEROBIC ONLY Blood Culture adequate volume Performed at [facility], [address], [place], [place] [address]    Culture   Final    NO GROWTH 5 DAYS Performed at [facility], [address] [place], [place] [address]    Report Status 05/24/2019 FINAL  Final  Culture, blood (Routine X 2) w Reflex to ID Panel     Status: None   Collection Time: 05/19/19  9:49 AM  Result Value Ref Range Status   Specimen Description   Final    BLOOD RIGHT HAND Performed at [facility], [address], [place], [place] [address]    Special Requests   Final    BOTTLES DRAWN AEROBIC ONLY Blood Culture adequate volume Performed at [facility], [address], [place], [place] [address]    Culture   Final    NO GROWTH 5 DAYS Performed at [facility], [address], [place], [place] [address]    Report Status 05/24/2019 FINAL  Final         Radiology Studies: No results found.      Scheduled Meds: . chlorhexidine  15 mL Mouth/Throat BID  . Chlorhexidine Gluconate Cloth  6 each Topical q morning - 10a  . enoxaparin (LOVENOX) injection  50 mg Subcutaneous Q12H  . famotidine  20 mg Oral BID  . feeding supplement (GLUCERNA 1.2 CAL)  1,000 mL Per Tube Q24H  . feeding supplement (PRO-STAT SUGAR FREE 64)  60 mL Per Tube TID  . free water  250 mL Per Tube Q4H  . guaiFENesin-dextromethorphan  15 mL Oral Q6H  . insulin aspart  0-20 Units Subcutaneous Q4H  . insulin aspart  4 Units Subcutaneous Q4H  . insulin glargine  20 Units Subcutaneous BID  . ipratropium-albuterol  3 mL Nebulization Q6H  . mouth rinse  15 mL Mouth  Rinse 10 times per day  . methylPREDNISolone (SOLU-MEDROL) injection  50 mg Intravenous Daily  . multivitamin  15 mL Oral Daily  . nutrition supplement (JUVEN)  1 packet Per Tube BID BM  .  QUEtiapine  25 mg Oral QHS  . sodium chloride flush  10-40 mL Intracatheter Q12H   Continuous Infusions: . sodium chloride Stopped (05/24/19 0552)  . fluconazole (DIFLUCAN) IV Stopped (05/23/19 1601)  . midazolam Stopped (05/24/19 1236)  . morphine 10 mg/hr (05/24/19 1300)     LOS: 10 days    Time spent: 35 minutes  During this encounter: Patient Isolation: Airborne, contact, droplet HCP PPE: Face shield, head covering, N95, gown, gloves, and shoe covers    Alberteen Sam, MD [facility] 05/24/2019, 1:45 PM     Please page through AMION:  [email or web address] Password [email or web address] If 7PM-7AM, please contact night-coverage

## 2019-05-24 NOTE — Progress Notes (Signed)
Hung new bag of morphine. Previous bag had 4 mL remaining. Wasted remaining into steri jug. Witnessed by Sharen Heck RN

## 2019-05-25 LAB — COMPREHENSIVE METABOLIC PANEL
ALT: 119 U/L — ABNORMAL HIGH (ref 0–44)
AST: 58 U/L — ABNORMAL HIGH (ref 15–41)
Albumin: 2.8 g/dL — ABNORMAL LOW (ref 3.5–5.0)
Alkaline Phosphatase: 123 U/L (ref 38–126)
Anion gap: 13 (ref 5–15)
BUN: 55 mg/dL — ABNORMAL HIGH (ref 8–23)
CO2: 36 mmol/L — ABNORMAL HIGH (ref 22–32)
Calcium: 7.9 mg/dL — ABNORMAL LOW (ref 8.9–10.3)
Chloride: 83 mmol/L — ABNORMAL LOW (ref 98–111)
Creatinine, Ser: 0.63 mg/dL (ref 0.61–1.24)
GFR calc Af Amer: >60 mL/min (ref >60)
GFR calc non Af Amer: >60 mL/min (ref >60)
Glucose, Bld: 256 mg/dL — ABNORMAL HIGH (ref 70–99)
Potassium: 4.3 mmol/L (ref 3.5–5.1)
Sodium: 132 mmol/L — ABNORMAL LOW (ref 135–145)
Total Bilirubin: 0.7 mg/dL (ref 0.3–1.2)
Total Protein: 6.2 g/dL — ABNORMAL LOW (ref 6.5–8.1)

## 2019-05-25 LAB — CBC
HCT: 43.1 % (ref 39.0–52.0)
Hemoglobin: 13.8 g/dL (ref 13.0–17.0)
MCH: 30.7 pg (ref 26.0–34.0)
MCHC: 32 g/dL (ref 30.0–36.0)
MCV: 96 fL (ref 80.0–100.0)
Platelets: 209 10*3/uL (ref 150–400)
RBC: 4.49 MIL/uL (ref 4.22–5.81)
RDW: 13.9 % (ref 11.5–15.5)
WBC: 14.4 10*3/uL — ABNORMAL HIGH (ref 4.0–10.5)
nRBC: 0.1 % (ref 0.0–0.2)

## 2019-05-25 LAB — GLUCOSE, CAPILLARY
Glucose-Capillary: 189 mg/dL — ABNORMAL HIGH (ref 70–99)
Glucose-Capillary: 142 mg/dL — ABNORMAL HIGH (ref 70–99)
Glucose-Capillary: 169 mg/dL — ABNORMAL HIGH (ref 70–99)
Glucose-Capillary: 200 mg/dL — ABNORMAL HIGH (ref 70–99)
Glucose-Capillary: 310 mg/dL — ABNORMAL HIGH (ref 70–99)
Glucose-Capillary: 326 mg/dL — ABNORMAL HIGH (ref 70–99)

## 2019-05-25 LAB — D-DIMER, QUANTITATIVE: D-Dimer, Quant: 2.53 ug/mL-FEU — ABNORMAL HIGH (ref 0.00–0.50)

## 2019-05-25 LAB — FERRITIN: Ferritin: 1081 ng/mL — ABNORMAL HIGH (ref 24–336)

## 2019-05-25 LAB — C-REACTIVE PROTEIN: CRP: 1.2 mg/dL — ABNORMAL HIGH (ref <1.0)

## 2019-05-25 MED ORDER — DEXMEDETOMIDINE HCL IN NACL 400 MCG/100ML IV SOLN
0.4000 ug/kg/h | INTRAVENOUS | Status: DC
  Administered 2019-05-25: 17:00:00 0.4 ug/kg/h via INTRAVENOUS
  Administered 2019-05-26: 01:00:00 0.7 ug/kg/h via INTRAVENOUS
  Filled 2019-05-25 (×2): qty 100

## 2019-05-25 MED ORDER — INSULIN ASPART 100 UNIT/ML ~~LOC~~ SOLN
6.0000 [IU] | SUBCUTANEOUS | Status: DC
  Administered 2019-05-25 – 2019-05-28 (×16): 6 [IU] via SUBCUTANEOUS

## 2019-05-25 MED ORDER — INSULIN GLARGINE 100 UNIT/ML ~~LOC~~ SOLN
24.0000 [IU] | Freq: Two times a day (BID) | SUBCUTANEOUS | Status: DC
  Administered 2019-05-25 (×2): 24 [IU] via SUBCUTANEOUS
  Filled 2019-05-25 (×3): qty 0.24

## 2019-05-25 MED ORDER — FUROSEMIDE 10 MG/ML IJ SOLN
40.0000 mg | Freq: Once | INTRAMUSCULAR | Status: AC
  Administered 2019-05-25: 40 mg via INTRAVENOUS
  Filled 2019-05-25: qty 4

## 2019-05-25 MED ORDER — FREE WATER
100.0000 mL | Status: DC
  Administered 2019-05-25 – 2019-05-28 (×20): 100 mL

## 2019-05-25 NOTE — Progress Notes (Signed)
Spoke to [employer], patient's daughter and gave her updates on patient. Told her that we were trying to ween patient off vent. Answered all questions that she had regarding patient condition. She said she would relay all information to patient's family. She asked if she could face time with patient. Told her I would call at 1600 to face time with her.

## 2019-05-25 NOTE — Progress Notes (Signed)
Wasted 15 mL of Midazolam 50mg /50L with Devoria Glassing, RN. Medication stopped per Dr. [place] Bells orders.  Wasted 15 mL in [place].

## 2019-05-25 NOTE — Progress Notes (Signed)
Called patient's granddaughter and let her face time with patient. She was grateful for the opportunity to speak with her grandfather.

## 2019-05-25 NOTE — Progress Notes (Signed)
[facility] PCCM PM Rounds  On exam will open eyes, doesn't follow commands Weaned for > 6 hours today  Plan  Stop midazolam Start precedex  Roselie Awkward, MD [place] [facility] Pager: [phone] Cell: [phone] If no response, call [phone]

## 2019-05-25 NOTE — Progress Notes (Signed)
PROGRESS NOTE    Lance Mason  ZOX:[id number] DOB: [birth year] DOA: 05/10/2019 PCP: Patient, No Pcp Per      Brief Narrative:  Lance Mason is a 63 y.o. M with DM, obesity, and HTN who presented with 1 week shortness of breath and cough, then fever and worsening SOB.  In the ER, SpO2 84%, required non-rebreather to maintain O2 sat >90%.  CXR with bilateral opacities.    Intubated after 3 days.     Assessment & Plan:  Coronavirus pneumonitis with acute hypoxic respiratory failure In setting of ongoing 2020 COVID-19 pandemic.  Completed azithromycin 8 days, cefepime 7 days Convalescent plasma on 5/29 S/p Actemra on 5/27 S/p remdesivir 5 days, 5/27 to 5/31  Inflammatory markers stable, down from peak.    Continues to wean from ventilator well, mentation still quite sedated, intermittently agitated. -Finish steroids today -Continue Lovenox for VTE PPx  -Continue daily d-dimer, ferritin and CRP -Continue scheduled bronchodilators -Repeat Lasix 40 mg IV once today -Continue close monitoring of K and renal function -Continue enteral sedation with Seroquel, up dose    -Soft restraints     COVID-19 Labs Recent Labs    05/23/19 0220 05/24/19 0130 05/25/19 0100  DDIMER 2.76* 2.67* 2.53*  FERRITIN 969* 934* 1,081*  CRP <0.8 <0.8 1.2*         Diabetes Glucoses still moderately high this AM. -Hold home metformin, glipizide -Continue Lantus, BID increase dose -Continue scheduled insulin -Continue sliding scale corrections   Hypertension BP soft.  Not on home meds.  Oral candidiasis -Continue fluconazole, day 5 of 7  Hypernatremia Resolved      MDM and disposition: Below labs and imaging reports reviewed and summarized above.  Medication management as above.  The patient was admitted with acute on toxic respiratory failure from COVID-19.  He has improving acute metabolic encephalopathy, persistent transaminitis, and severe  hyperglycemia, requiring frequent adjustment of insulin dosing.  This encounter is associated with a high degree of medical risk, high complexity medical decision making.  35 minutes was spent with the patient, face-to-face around the floor with greater than 50% of total time spent examining the patient, counseling family via phone regarding his condition, and coordinating care.       Ventilator best practice:  Diet: TFs VAP protocol (if indicated): Yes GI prophylaxis: Famotidine Glucose control: Insulin  Mobility: N/A  DVT prophylaxis: Lovenox Code Status: FULL     Consultants:   PCCM  Procedures:   5/29 ETT  5/29 L subclavian central line  Antimicrobials:   Azithromycin 5/26 >> 6/2  Cefepime 5/27 >> 6/2  Fluconazole 6/2 >>  Remdesivir 5/27 >> 5/31  Vancomycin 5/26 >> 5/28   Culture data:   MRSA nares swab-negative on admission  5/26 blood culture x1-no growth to date  5/31 blood culture x2 --no growth to date  5/31 respiratory culture- normal respiratory flora       Subjective: Intubated and sedated.  Very sleepy today, does not wake to exam.  Some agitation overnight with sedation reduction.  Objective: Vitals:   05/25/19 0500 05/25/19 0600 05/25/19 0700 05/25/19 0746  BP: 115/74 111/77 114/71 114/71  Pulse: (!) 102 (!) 104 (!) 109   Resp: (!) 24 (!) 24 (!) 24   Temp:    97.9 F (36.6 C)  TempSrc:    Axillary  SpO2: 93% 93% 92%   Height:        Intake/Output Summary (Last 24 hours) at 05/25/2019 0905 Last  data filed at 05/25/2019 0700 Gross per 24 hour  Intake 6349.38 ml  Output 3590 ml  Net 2759.38 ml   Filed Weights    Examination: General appearance: Obese adult male, intubated and sedated. HEENT: Anicteric, conjunctival pink, lids and lashes normal.  No nasal deformity, discharge, or epistaxis. Skin: Warm and dry, no suspicious rashes or lesions. Cardiac: Tachycardic, regular, JVP not visible due to body habitus, no lower  extremity edema.    Respiratory: On full mechanical ventilation, lung sounds diminished bilaterally, no wheezing noted. Abdomen: Abdomen soft, no grimace to palpation. Neuro/Psych: Sedated, no purposeful movements.    Data Reviewed: I have personally reviewed following labs and imaging studies:  CBC: Recent Labs  Lab 05/19/19 0400  05/21/19 0215 05/22/19 0150 05/23/19 0220  05/23/19 1603 05/24/19 0130 05/24/19 0805 05/24/19 1213 05/25/19 0100  WBC 18.7*  --  12.3* 10.9* 14.2*  --   --  13.6*  --   --  14.4*  NEUTROABS 14.4*  --   --   --   --   --   --   --   --   --   --   HGB 14.5   < > 12.8* 12.9* 13.5   < > 12.9* 13.4 13.6 14.6 13.8  HCT 44.0   < > 39.2 39.9 40.7   < > 38.0* 41.0 40.0 43.0 43.1  MCV 95.2  --  95.4 93.4 93.3  --   --  94.0  --   --  96.0  PLT 361  --  220 215 213  --   --  185  --   --  209   < > = values in this interval not displayed.   Basic Metabolic Panel: Recent Labs  Lab 05/19/19 0400  05/21/19 0215 05/22/19 0150 05/23/19 0220  05/23/19 1603 05/24/19 0130 05/24/19 0805 05/24/19 1213 05/25/19 0100  NA 149*   < > 138 136 135   < > 135 138 136 137 132*  K 3.7   < > 4.4 4.0 4.0   < > 4.6 3.7 3.7 3.8 4.3  CL 112*   < > 97* 93* 91*  --   --  90*  --   --  83*  CO2 25   < > 30 33* 31  --   --  38*  --   --  36*  GLUCOSE 165*   < > 245* 365* 294*  --   --  139*  --   --  256*  BUN 34*   < > 35* 39* 34*  --   --  39*  --   --  55*  CREATININE 0.90   < > 0.66 0.64 0.58*  --   --  0.60*  --   --  0.63  CALCIUM 8.0*   < > 8.3* 8.0* 8.5*  --   --  8.6*  --   --  7.9*  MG 2.6*  --   --   --   --   --   --   --   --   --   --   PHOS 2.8  --   --   --   --   --   --   --   --   --   --    < > = values in this interval not displayed.   GFR: Estimated Creatinine Clearance: 105.7 mL/min (by C-G formula based on SCr of 0.63 mg/dL). Liver  Function Tests: Recent Labs  Lab 05/21/19 0215 05/22/19 0150 05/23/19 0220 05/24/19 0130 05/25/19 0100  AST 44*  64* 55* 64* 58*  ALT 57* 69* 71* 93* 119*  ALKPHOS 130* 124 123 124 123  BILITOT 0.4 0.6 0.6 0.4 0.7  PROT 5.8* 6.2* 6.2* 5.9* 6.2*  ALBUMIN 2.5* 2.7* 2.9* 2.7* 2.8*   No results for input(s): LIPASE, AMYLASE in the last 168 hours. No results for input(s): AMMONIA in the last 168 hours. Coagulation Profile: No results for input(s): INR, PROTIME in the last 168 hours. Cardiac Enzymes: Recent Labs  Lab 05/19/19 0400 05/19/19 1040  CKTOTAL 498*  --   TROPONINI  --  0.06*   BNP (last 3 results) No results for input(s): PROBNP in the last 8760 hours. HbA1C: Recent Labs    05/23/19 0220  HGBA1C 9.9*   CBG: Recent Labs  Lab 05/24/19 1634 05/24/19 1928 05/24/19 2323 05/25/19 0328 05/25/19 0756  GLUCAP 307* 349* 276* 189* 142*   Lipid Profile: No results for input(s): CHOL, HDL, LDLCALC, TRIG, CHOLHDL, LDLDIRECT in the last 72 hours. Thyroid Function Tests: No results for input(s): TSH, T4TOTAL, FREET4, T3FREE, THYROIDAB in the last 72 hours. Anemia Panel: Recent Labs    05/24/19 0130 05/25/19 0100  FERRITIN 934* 1,081*   Urine analysis:    Component Value Date/Time   COLORURINE YELLOW 05/28/2016 2056   APPEARANCEUR CLEAR 05/28/2016 2056   LABSPEC 1.041 (H) 05/28/2016 2056   PHURINE 5.0 05/28/2016 2056   GLUCOSEU >1000 (A) 05/28/2016 2056   HGBUR NEGATIVE 05/28/2016 2056   BILIRUBINUR negative 06/19/2018 1601   KETONESUR negative 06/19/2018 1601   KETONESUR 15 (A) 05/28/2016 2056   PROTEINUR negative 06/19/2018 1601   PROTEINUR NEGATIVE 05/28/2016 2056   UROBILINOGEN 0.2 06/19/2018 1601   NITRITE Negative 06/19/2018 1601   NITRITE NEGATIVE 05/28/2016 2056   LEUKOCYTESUR Negative 06/19/2018 1601   Sepsis Labs: @LABRCNTIP (procalcitonin:4,lacticacidven:4)  ) Recent Results (from the past 240 hour(s))  MRSA PCR Screening     Status: None   Collection Time: 05/15/19  1:55 PM  Result Value Ref Range Status   MRSA by PCR NEGATIVE NEGATIVE Final    Comment:         The GeneXpert MRSA Assay (FDA approved for NASAL specimens only), is one component of a comprehensive MRSA colonization surveillance program. It is not intended to diagnose MRSA infection nor to guide or monitor treatment for MRSA infections. Performed at [facility], [address] [place], [place] [address]   Culture, respiratory (non-expectorated)     Status: None   Collection Time: 05/19/19  9:44 AM  Result Value Ref Range Status   Specimen Description   Final    TRACHEAL ASPIRATE Performed at [facility], [address] [place], [place] [address]    Special Requests   Final    NONE Performed at [facility], [address] [place], [place] [address]    Gram Stain   Final    RARE WBC PRESENT, PREDOMINANTLY PMN RARE GRAM POSITIVE COCCI    Culture   Final    FEW Consistent with normal respiratory flora. Performed at [facility], [address] [place], [place] [address]    Report Status 05/21/2019 FINAL  Final  Culture, blood (Routine X 2) w Reflex to ID Panel     Status: None   Collection Time: 05/19/19  9:44 AM  Result Value Ref Range Status   Specimen Description  Final    RIGHT ANTECUBITAL Performed at [facility], [place] [address], [place], [place] [address]    Special Requests   Final    BOTTLES DRAWN AEROBIC ONLY Blood Culture adequate volume Performed at [facility] [address], [place], [place] [address]    Culture   Final    NO GROWTH 5 DAYS Performed at [facility], [place] [address] [place], [place] [address]    Report Status 05/24/2019 FINAL  Final  Culture, blood (Routine X 2) w Reflex to ID Panel     Status: None   Collection Time: 05/19/19  9:49 AM  Result Value Ref Range Status   Specimen Description   Final    BLOOD RIGHT HAND Performed at [facility] [address], [place], [place] [address]     Special Requests   Final    BOTTLES DRAWN AEROBIC ONLY Blood Culture adequate volume Performed at [facility] [address], [place], [place] [address]    Culture   Final    NO GROWTH 5 DAYS Performed at [facility], [place] [address], [place], [place] [address]    Report Status 05/24/2019 FINAL  Final         Radiology Studies: No results found.      Scheduled Meds:  chlorhexidine  15 mL Mouth/Throat BID   Chlorhexidine Gluconate Cloth  6 each Topical q morning - 10a   enoxaparin (LOVENOX) injection  50 mg Subcutaneous Q12H   famotidine  20 mg Oral BID   feeding supplement (GLUCERNA 1.2 CAL)  1,000 mL Per Tube Q24H   feeding supplement (PRO-STAT SUGAR FREE 64)  60 mL Per Tube TID   free water  100 mL Per Tube Q4H   furosemide  40 mg Intravenous Once   guaiFENesin-dextromethorphan  15 mL Oral Q6H   insulin aspart  0-20 Units Subcutaneous Q4H   insulin aspart  6 Units Subcutaneous Q4H   insulin glargine  24 Units Subcutaneous BID   ipratropium-albuterol  3 mL Nebulization Q6H   mouth rinse  15 mL Mouth Rinse 10 times per day   methylPREDNISolone (SOLU-MEDROL) injection  50 mg Intravenous Daily   multivitamin  15 mL Oral Daily   nutrition supplement (JUVEN)  1 packet Per Tube BID BM   QUEtiapine  50 mg Oral QHS   sodium chloride flush  10-40 mL Intracatheter Q12H   Continuous Infusions:  sodium chloride Stopped (05/24/19 1800)   fluconazole (DIFLUCAN) IV Stopped (05/24/19 1722)   midazolam 1 mg/hr (05/25/19 0700)   morphine 10 mg/hr (05/25/19 0700)     LOS: 11 days    Time spent:      During this encounter: Patient Isolation: Airborne, contact, droplet HCP PPE: Face shield, head covering, N95, gown, gloves, and shoe covers    Edwin Dada, MD [facility] 05/25/2019, 9:05 AM     Please page through [place]:  [email or web address] Password [email or web address] If 7PM-7AM, please contact night-coverage

## 2019-05-25 NOTE — Progress Notes (Signed)
NAME:  Lance Mason, MRN:  [id number], DOB:  03-25-1956, LOS: 11 ADMISSION DATE:  05/11/2019, CONSULTATION DATE: May 15, 2019 REFERRING MD: Dr. Kerry HoughMemon, CHIEF COMPLAINT: Dyspnea  Brief History   63 year old male with diabetes type 2, hypertension admitted on May 26 with acute respiratory failure with hypoxemia due to COVID-19 pneumonia.   Past Medical History  Hypertension Hyperlipidemia DM2  Significant Hospital Events   May 26 admission May 29 intubation, mechanical ventilation in setting of profound encephalopathy May 31 worsening dyssynchrony, hypoxemia, added vecuronium June 1 chemical paralysis used overnight  June 2-5 weaning FiO2/PEEP June 6 weaning well on pressure support  Consults:  Pulmonary and critical care medicine  Procedures:  Endotracheal tube May 29 > L subclavian CVL May 29 >   Significant Diagnostic Tests:  CT angiogram chest May 27 diffuse bilateral patchy airspace disease groundglass upper lobes predominant, no pulmonary embolism June 1 bilateral lower ext doppler > neg DVT June 1 TTE >>>  Micro Data:  May 21 SARS-CoV-2 positive May 26 blood cultures  Antimicrobials/COVID treatment  May 26 azithromycin May 27 cefepime May 27 vancomycin May 27 remdesivir May 27 Tocilizumab May 29 Convalescent plasma    Interim history/subjective:  Weaning on pressure support this morning Sedation still in use, current RA SS score -3 Making urine, bowel movements within normal limits No other acute events  Objective   Blood pressure 114/71, pulse (!) 109, temperature 97.9 F (36.6 C), temperature source Axillary, resp. rate (!) 24, height 5\' 5"  (1.651 m), weight 105.6 kg, SpO2 92 %. CVP:  [0 mmHg-10 mmHg] 0 mmHg  Vent Mode: PRVC FiO2 (%):  [40 %] 40 % Set Rate:  [24 bmp] 24 bmp Vt Set:  [490 mL] 490 mL PEEP:  [5 cmH20] 5 cmH20 Plateau Pressure:  [19 cmH20-23 cmH20] 20 cmH20   Intake/Output Summary (Last 24 hours) at 05/25/2019 1205 Last  data filed at 05/25/2019 [id number] Gross per 24 hour  Intake 6322.89 ml  Output 3720 ml  Net 2602.89 ml   Filed Weights    Examination:  General:  In bed on vent HENT: NCAT ETT in place PULM: CTA B, vent supported breathing CV: RRR, no mgr GI: BS+, soft, nontender MSK: normal bulk and tone Neuro: opens eyes to voice, doesn't follow commands    Resolved Hospital Problem list     Assessment & Plan:  ARDS due to COVID pneumonia: Oxygenation improving Continue pressure support as long as tolerated today Target SaO2 greater than 85% Ventilator associated pneumonia prevention protocol Wake-up assessment now, decrease sedation Mental status may be barrier to extubation as the main reason he was intubated initially was because of profound encephalopathy Need to see improved mental status prior to extubation  Need for sedation/ventilator synchrony RA SS goal 0 to -1, will adjust today Wean off morphine and Versed now for wake-up assessment If not able to follow commands then changed to Precedex Continue seroquel  Best practice:  Diet: Tube feeding Pain/Anxiety/Delirium protocol (if indicated): yes, as above VAP protocol (if indicated): yes DVT prophylaxis: lovenox bid per COVID protocol GI prophylaxis: famotidine Glucose control: SSI Mobility: bed rest, passive range of motion in BED reasonable Code Status: full Family Communication: [facility] Dis[facility]: remain in ICU  Labs   CBC: Recent Labs  Lab 05/19/19 0400  05/21/19 0215 05/22/19 0150 05/23/19 0220  05/23/19 1603 05/24/19 0130 05/24/19 0805 05/24/19 1213 05/25/19 0100  WBC 18.7*  --  12.3* 10.9* 14.2*  --   --  13.6*  --   --  14.4*  NEUTROABS 14.4*  --   --   --   --   --   --   --   --   --   --   HGB 14.5   < > 12.8* 12.9* 13.5   < > 12.9* 13.4 13.6 14.6 13.8  HCT 44.0   < > 39.2 39.9 40.7   < > 38.0* 41.0 40.0 43.0 43.1  MCV 95.2  --  95.4 93.4 93.3  --   --  94.0  --   --  96.0  PLT 361  --  220 215 213   --   --  185  --   --  209   < > = values in this interval not displayed.    Basic Metabolic Panel: Recent Labs  Lab 05/19/19 0400  05/21/19 0215 05/22/19 0150 05/23/19 0220  05/23/19 1603 05/24/19 0130 05/24/19 0805 05/24/19 1213 05/25/19 0100  NA 149*   < > 138 136 135   < > 135 138 136 137 132*  K 3.7   < > 4.4 4.0 4.0   < > 4.6 3.7 3.7 3.8 4.3  CL 112*   < > 97* 93* 91*  --   --  90*  --   --  83*  CO2 25   < > 30 33* 31  --   --  38*  --   --  36*  GLUCOSE 165*   < > 245* 365* 294*  --   --  139*  --   --  256*  BUN 34*   < > 35* 39* 34*  --   --  39*  --   --  55*  CREATININE 0.90   < > 0.66 0.64 0.58*  --   --  0.60*  --   --  0.63  CALCIUM 8.0*   < > 8.3* 8.0* 8.5*  --   --  8.6*  --   --  7.9*  MG 2.6*  --   --   --   --   --   --   --   --   --   --   PHOS 2.8  --   --   --   --   --   --   --   --   --   --    < > = values in this interval not displayed.   GFR: Estimated Creatinine Clearance: 105.7 mL/min (by C-G formula based on SCr of 0.63 mg/dL). Recent Labs  Lab 05/19/19 1040 05/20/19 0230 05/21/19 0215 05/22/19 0150 05/23/19 0220 05/24/19 0130 05/25/19 0100  PROCALCITON 0.65 0.29 0.17  --   --   --   --   WBC  --   --  12.3* 10.9* 14.2* 13.6* 14.4*    Liver Function Tests: Recent Labs  Lab 05/21/19 0215 05/22/19 0150 05/23/19 0220 05/24/19 0130 05/25/19 0100  AST 44* 64* 55* 64* 58*  ALT 57* 69* 71* 93* 119*  ALKPHOS 130* 124 123 124 123  BILITOT 0.4 0.6 0.6 0.4 0.7  PROT 5.8* 6.2* 6.2* 5.9* 6.2*  ALBUMIN 2.5* 2.7* 2.9* 2.7* 2.8*   No results for input(s): LIPASE, AMYLASE in the last 168 hours. No results for input(s): AMMONIA in the last 168 hours.  ABG    Component Value Date/Time   PHART 7.468 (H) 05/24/2019 1213   PCO2ART 53.3 (H) 05/24/2019 1213   PO2ART 72.0 (L) 05/24/2019 1213  HCO3 38.6 (H) 05/24/2019 1213   TCO2 40 (H) 05/24/2019 1213   ACIDBASEDEF 3.0 (H) 05/18/2019 1601   O2SAT 95.0 05/24/2019 1213     Coagulation  Profile: No results for input(s): INR, PROTIME in the last 168 hours.  Cardiac Enzymes: Recent Labs  Lab 05/19/19 0400 05/19/19 1040  CKTOTAL 498*  --   TROPONINI  --  0.06*    HbA1C: Hemoglobin A1C  Date/Time Value Ref Range Status  06/19/2018 04:01 PM 9.1 (A) 4.0 - 5.6 % Final  01/02/2017 06:51 PM 12.8  Final   Hgb A1c MFr Bld  Date/Time Value Ref Range Status  05/23/2019 02:20 AM 9.9 (H) 4.8 - 5.6 % Final    Comment:    (NOTE) Pre diabetes:          5.7%-6.4% Diabetes:              >6.4% Glycemic control for   <7.0% adults with diabetes     CBG: Recent Labs  Lab 05/24/19 1634 05/24/19 1928 05/24/19 2323 05/25/19 0328 05/25/19 0756  GLUCAP 307* 349* 276* 189* 142*       Critical care time: 37 minutes The patient remains critically ill requiring mechanical ventilation, adjustment of continuous sedation medicines and management of multiple databases of information.  Multidisciplinary rounding coordinated by pulmonary critical care service in conjunction with hospitalist team.     Heber CarolinaBrent Fumiye Lubben, MD [facility] Pager: [phone] Cell: [phone] If no response, call [phone]

## 2019-05-26 ENCOUNTER — Inpatient Hospital Stay (HOSPITAL_COMMUNITY): Payer: BLUE CROSS/BLUE SHIELD

## 2019-05-26 ENCOUNTER — Other Ambulatory Visit: Payer: Self-pay

## 2019-05-26 LAB — COMPREHENSIVE METABOLIC PANEL
ALT: 128 U/L — ABNORMAL HIGH (ref 0–44)
AST: 58 U/L — ABNORMAL HIGH (ref 15–41)
Albumin: 2.9 g/dL — ABNORMAL LOW (ref 3.5–5.0)
Alkaline Phosphatase: 136 U/L — ABNORMAL HIGH (ref 38–126)
Anion gap: 16 — ABNORMAL HIGH (ref 5–15)
BUN: 54 mg/dL — ABNORMAL HIGH (ref 8–23)
CO2: 30 mmol/L (ref 22–32)
Calcium: 8.8 mg/dL — ABNORMAL LOW (ref 8.9–10.3)
Chloride: 91 mmol/L — ABNORMAL LOW (ref 98–111)
Creatinine, Ser: 0.77 mg/dL (ref 0.61–1.24)
GFR calc Af Amer: >60 mL/min (ref >60)
GFR calc non Af Amer: >60 mL/min (ref >60)
Glucose, Bld: 342 mg/dL — ABNORMAL HIGH (ref 70–99)
Potassium: 4 mmol/L (ref 3.5–5.1)
Sodium: 137 mmol/L (ref 135–145)
Total Bilirubin: 0.7 mg/dL (ref 0.3–1.2)
Total Protein: 6.4 g/dL — ABNORMAL LOW (ref 6.5–8.1)

## 2019-05-26 LAB — GLUCOSE, CAPILLARY
Glucose-Capillary: 149 mg/dL — ABNORMAL HIGH (ref 70–99)
Glucose-Capillary: 156 mg/dL — ABNORMAL HIGH (ref 70–99)
Glucose-Capillary: 174 mg/dL — ABNORMAL HIGH (ref 70–99)
Glucose-Capillary: 183 mg/dL — ABNORMAL HIGH (ref 70–99)
Glucose-Capillary: 253 mg/dL — ABNORMAL HIGH (ref 70–99)
Glucose-Capillary: 94 mg/dL (ref 70–99)

## 2019-05-26 LAB — POCT I-STAT 7, (LYTES, BLD GAS, ICA,H+H)
Acid-Base Excess: 11 mmol/L — ABNORMAL HIGH (ref 0.0–2.0)
Bicarbonate: 36.1 mmol/L — ABNORMAL HIGH (ref 20.0–28.0)
Calcium, Ion: 1.2 mmol/L (ref 1.15–1.40)
HCT: 41 % (ref 39.0–52.0)
Hemoglobin: 13.9 g/dL (ref 13.0–17.0)
O2 Saturation: 91 %
Patient temperature: 99.5
Potassium: 3.7 mmol/L (ref 3.5–5.1)
Sodium: 137 mmol/L (ref 135–145)
TCO2: 38 mmol/L — ABNORMAL HIGH (ref 22–32)
pCO2 arterial: 48.8 mmHg — ABNORMAL HIGH (ref 32.0–48.0)
pH, Arterial: 7.479 — ABNORMAL HIGH (ref 7.350–7.450)
pO2, Arterial: 59 mmHg — ABNORMAL LOW (ref 83.0–108.0)

## 2019-05-26 LAB — D-DIMER, QUANTITATIVE: D-Dimer, Quant: 2.54 ug/mL-FEU — ABNORMAL HIGH (ref 0.00–0.50)

## 2019-05-26 LAB — CBC
HCT: 46.1 % (ref 39.0–52.0)
Hemoglobin: 15 g/dL (ref 13.0–17.0)
MCH: 31.6 pg (ref 26.0–34.0)
MCHC: 32.5 g/dL (ref 30.0–36.0)
MCV: 97.3 fL (ref 80.0–100.0)
Platelets: 298 10*3/uL (ref 150–400)
RBC: 4.74 MIL/uL (ref 4.22–5.81)
RDW: 14.1 % (ref 11.5–15.5)
WBC: 25.9 10*3/uL — ABNORMAL HIGH (ref 4.0–10.5)
nRBC: 0.2 % (ref 0.0–0.2)

## 2019-05-26 LAB — FERRITIN: Ferritin: 927 ng/mL — ABNORMAL HIGH (ref 24–336)

## 2019-05-26 LAB — LACTIC ACID, PLASMA
Lactic Acid, Venous: 2.7 mmol/L (ref 0.5–1.9)
Lactic Acid, Venous: 3 mmol/L (ref 0.5–1.9)

## 2019-05-26 LAB — C-REACTIVE PROTEIN: CRP: <0.8 mg/dL (ref <1.0)

## 2019-05-26 MED ORDER — MIDAZOLAM HCL 2 MG/2ML IJ SOLN
INTRAMUSCULAR | Status: AC
  Filled 2019-05-26: qty 2

## 2019-05-26 MED ORDER — CLONAZEPAM 0.5 MG PO TBDP
1.0000 mg | ORAL_TABLET | Freq: Two times a day (BID) | ORAL | Status: DC
  Administered 2019-05-26 – 2019-05-27 (×3): 1 mg via ORAL
  Filled 2019-05-26 (×3): qty 2

## 2019-05-26 MED ORDER — SODIUM CHLORIDE 0.9 % IV BOLUS
250.0000 mL | Freq: Once | INTRAVENOUS | Status: AC
  Administered 2019-05-26: 250 mL via INTRAVENOUS

## 2019-05-26 MED ORDER — HEPARIN (PORCINE) 25000 UT/250ML-% IV SOLN
1100.0000 [IU]/h | INTRAVENOUS | Status: DC
  Administered 2019-05-26: 19:00:00 1250 [IU]/h via INTRAVENOUS
  Filled 2019-05-26 (×2): qty 250

## 2019-05-26 MED ORDER — SODIUM CHLORIDE 0.9 % IV SOLN
2.0000 g | Freq: Three times a day (TID) | INTRAVENOUS | Status: DC
  Administered 2019-05-26 – 2019-05-29 (×9): 2 g via INTRAVENOUS
  Filled 2019-05-26 (×10): qty 2

## 2019-05-26 MED ORDER — LACTATED RINGERS IV BOLUS
1000.0000 mL | Freq: Once | INTRAVENOUS | Status: AC
  Administered 2019-05-26: 1000 mL via INTRAVENOUS

## 2019-05-26 MED ORDER — PHENYLEPHRINE HCL-NACL 10-0.9 MG/250ML-% IV SOLN
0.0000 ug/min | INTRAVENOUS | Status: DC
  Administered 2019-05-26 – 2019-05-27 (×2): 20 ug/min via INTRAVENOUS
  Filled 2019-05-26: qty 250

## 2019-05-26 MED ORDER — VANCOMYCIN HCL 10 G IV SOLR
2000.0000 mg | Freq: Once | INTRAVENOUS | Status: AC
  Administered 2019-05-26: 2000 mg via INTRAVENOUS
  Filled 2019-05-26: qty 2000

## 2019-05-26 MED ORDER — MIDAZOLAM HCL 2 MG/2ML IJ SOLN
2.0000 mg | Freq: Once | INTRAMUSCULAR | Status: AC
  Administered 2019-05-26: 2 mg via INTRAVENOUS

## 2019-05-26 MED ORDER — INSULIN GLARGINE 100 UNIT/ML ~~LOC~~ SOLN
30.0000 [IU] | Freq: Two times a day (BID) | SUBCUTANEOUS | Status: DC
  Administered 2019-05-26 – 2019-05-27 (×4): 30 [IU] via SUBCUTANEOUS
  Filled 2019-05-26 (×5): qty 0.3

## 2019-05-26 MED ORDER — CLONAZEPAM 0.1 MG/ML ORAL SUSPENSION: 1.0000 mg | Freq: Two times a day (BID) | ORAL | Status: DC

## 2019-05-26 MED ORDER — VANCOMYCIN HCL 10 G IV SOLR
1250.0000 mg | Freq: Two times a day (BID) | INTRAVENOUS | Status: DC
  Administered 2019-05-26 – 2019-05-28 (×5): 1250 mg via INTRAVENOUS
  Filled 2019-05-26: qty 1250
  Filled 2019-05-26: qty 1000
  Filled 2019-05-26: qty 1250
  Filled 2019-05-26 (×2): qty 1000
  Filled 2019-05-26: qty 1250

## 2019-05-26 MED ORDER — LORAZEPAM 2 MG/ML IJ SOLN
2.0000 mg | Freq: Once | INTRAMUSCULAR | Status: AC
  Administered 2019-05-26: 2 mg via INTRAVENOUS
  Filled 2019-05-26: qty 1

## 2019-05-26 MED ORDER — MIDAZOLAM 50MG/50ML (1MG/ML) PREMIX INFUSION
0.0000 mg/h | INTRAVENOUS | Status: DC
  Administered 2019-05-26: 14:00:00 1 mg/h via INTRAVENOUS
  Administered 2019-05-26: 21:00:00 4 mg/h via INTRAVENOUS
  Filled 2019-05-26 (×3): qty 50

## 2019-05-26 NOTE — Progress Notes (Signed)
Patient transported on vent to CT without complications. 

## 2019-05-26 NOTE — Progress Notes (Addendum)
Pt's BP now dropping into the 70's. MD paged. Precedex turned off. Order received to start Neo. 250 bolus given before start of Neo.

## 2019-05-26 NOTE — Progress Notes (Signed)
PROGRESS NOTE    Lance Mason Lance Mason  XBM:[id number] DOB: March 16, 1956 DOA: May 27, 2019 PCP: Patient, No Pcp Per      Brief Narrative:  [name] is a 63 y.o. M with DM, obesity, and HTN who presented with 1 week shortness of breath and cough, then fever and worsening SOB.  In the ER, SpO2 84%, required non-rebreather to maintain O2 sat >90%.  CXR with bilateral opacities.    Intubated after 3 days.     Assessment & Plan:  Coronavirus pneumonitis with acute hypoxic respiratory failure In setting of ongoing 2020 COVID-19 pandemic.  Completed azithromycin 8 days, cefepime 7 days Convalescent plasma on 5/29 S/p Actemra on 5/27 S/p remdesivir 5 days, 5/27 to 5/31  Inflammatory markers elevated, no change.  This morning, developed abrupt tachycardia when stimulated.  Versed and Precedex needed to normalize heart rate, but then developed hypotension for a period.  This AM around 7A, abruptly tachycardic (ECG personally reviewed showed sinus tachycardia) and appeared to have seizure like activity in the left leg, bilateral arms).    -Continue Lovenox for VTE PPx  -Continue daily d-dimer, ferritin and CRP -Continue scheduled bronchodilators  -Continue enteral sedation with Seroquel, up dose       COVID-19 Labs Recent Labs    05/24/19 0130 05/25/19 0100 05/26/19 0120  DDIMER 2.67* 2.53* 2.54*  FERRITIN 934* 1,081* 927*  CRP <0.8 1.2* <0.8     Fever Tachycardia Fever and tachycardia this morning, worsening leukocytosis, also new respiratory secretions suctioned by RT. -Check lactic acid -Repeat blood and sputum cultures  -Start vancomycin and restart cefepime   Seizure-like activity Nursing noted a few minutes seizure like activity (clonic jerking of both arms, left leg) that were self-limited this morning, in context of tachycardia.  Patient subsequently agitated and moving all extremities.  No focal weakness.   Also, no further posturing, no further  seizure like activity today.  Doubt status. -CT head without contrast ordered -Defer EEG given COVID isolation  Diabetes A1c 9.9%.  Glucoses elevated -Hold home metformin, glipizide -Continue Lantus, BID adjust dose again -Continue scheduled insulin -Continue sliding scale corrections   Hypertension BP soft.  Not on home meds.  Oral candidiasis -Continue fluconazole, day 6 of 7  Hypernatremia Resolved      MDM and disposition: The below labs and imaging reports were reviewed and summarized above.  Medication management as above.  The patient was admitted with acute on toxic respiratory failure from COVID-19.  He has improving acute metabolic encephalopathy, persistent transaminitis, and severe hyperglycemia, requiring frequent adjustment of insulin dosing.    In addition, he has new seizure like activity and new fever, work up as above.  The patient is critically ill with multi-organ failure.  Critical care was necessary to treat or prevent imminent or life-threatening deterioration of sepsis, respiratory failure, and neurological failure and was exclusive of separately billable procedures and treating other patients. Total critical care time spent by me: 55 minutes Time spent personally by me on obtaining history from patient or surrogate, evaluation of the patient, evaluation of patient's response to treatment, ordering and review of laboratory studies, ordering and review of radiographic studies, ordering and performing treatments and interventions, and re-evaluation of the patient's condition.      Ventilator best practice:  Diet: TFs VAP protocol (if indicated): Yes GI prophylaxis: Famotidine Glucose control: Insulin  Mobility: N/A  DVT prophylaxis: Lovenox Code Status: FULL     Consultants:   PCCM  Procedures:  5/29 ETT  5/29 L subclavian central line  Antimicrobials:   Azithromycin 5/26 >> 6/2  Cefepime 5/27 >> 6/2 and 6/7 >>  Fluconazole 6/2  >>  Remdesivir 5/27 >> 5/31  Vancomycin 5/26 >> 5/28  And 6/7 >>   Culture data:   MRSA nares swab-negative on admission  5/26 blood culture x1-no growth to date  5/31 blood culture x2 --no growth to date  5/31 respiratory culture- normal respiratory flora  6/7 blood culture x2 -- pending  6/7 sputum culture -- pending       Subjective: Intubated and sedated, hypotension, tachycardia, seizure-like activity this morning.  Fever and new respiratory secretions.  Objective: Vitals:   05/26/19 0835 05/26/19 1036 05/26/19 1138 05/26/19 1234  BP:   111/71 137/80  Pulse:   (!) 130 (!) 141  Resp:   (!) 28 (!) 24  Temp: 99.5 F (37.5 C) 100.3 F (37.9 C)    TempSrc: Axillary     SpO2:   94% 95%  Height:        Intake/Output Summary (Last 24 hours) at 05/26/2019 1421 Last data filed at 05/26/2019 1240 Gross per 24 hour  Intake 2841.66 ml  Output 3175 ml  Net -333.34 ml   Filed Weights    Examination: General appearance: Obese adult male, intubated and sedated, supine. HEENT: Anicteric, conjunctival pink, lids and lashes normal.  No nasal deformity, discharge, or epistaxis. Skin: Flushed, warm, moist, no suspicious rashes or lesions Cardiac: Tachycardic, regular, no murmurs, no lower extremity edema, JVP not visible.Marland Kitchen.    Respiratory: On full mechanical ventilation, lung sounds diminished bilaterally, coarse breath sounds, expiratory wheezing faint, noted. Abdomen: Abdomen soft without rigidity, no grimace to palpation.  No masses. Neuro/Psych: No jerking, appears sedated, pupils equal.   Data Reviewed: I have personally reviewed following labs and imaging studies:  CBC: Recent Labs  Lab 05/22/19 0150 05/23/19 0220  05/24/19 0130 05/24/19 0805 05/24/19 1213 05/25/19 0100 05/26/19 0120 05/26/19 0857  WBC 10.9* 14.2*  --  13.6*  --   --  14.4* 25.9*  --   HGB 12.9* 13.5   < > 13.4 13.6 14.6 13.8 15.0 13.9  HCT 39.9 40.7   < > 41.0 40.0 43.0 43.1 46.1 41.0   MCV 93.4 93.3  --  94.0  --   --  96.0 97.3  --   PLT 215 213  --  185  --   --  209 298  --    < > = values in this interval not displayed.   Basic Metabolic Panel: Recent Labs  Lab 05/22/19 0150 05/23/19 0220  05/24/19 0130 05/24/19 0805 05/24/19 1213 05/25/19 0100 05/26/19 0120 05/26/19 0857  NA 136 135   < > 138 136 137 132* 137 137  K 4.0 4.0   < > 3.7 3.7 3.8 4.3 4.0 3.7  CL 93* 91*  --  90*  --   --  83* 91*  --   CO2 33* 31  --  38*  --   --  36* 30  --   GLUCOSE 365* 294*  --  139*  --   --  256* 342*  --   BUN 39* 34*  --  39*  --   --  55* 54*  --   CREATININE 0.64 0.58*  --  0.60*  --   --  0.63 0.77  --   CALCIUM 8.0* 8.5*  --  8.6*  --   --  7.9* 8.8*  --    < > =  values in this interval not displayed.   GFR: Estimated Creatinine Clearance: 105.7 mL/min (by C-G formula based on SCr of 0.77 mg/dL). Liver Function Tests: Recent Labs  Lab 05/22/19 0150 05/23/19 0220 05/24/19 0130 05/25/19 0100 05/26/19 0120  AST 64* 55* 64* 58* 58*  ALT 69* 71* 93* 119* 128*  ALKPHOS 124 123 124 123 136*  BILITOT 0.6 0.6 0.4 0.7 0.7  PROT 6.2* 6.2* 5.9* 6.2* 6.4*  ALBUMIN 2.7* 2.9* 2.7* 2.8* 2.9*   No results for input(s): LIPASE, AMYLASE in the last 168 hours. No results for input(s): AMMONIA in the last 168 hours. Coagulation Profile: No results for input(s): INR, PROTIME in the last 168 hours. Cardiac Enzymes: No results for input(s): CKTOTAL, CKMB, CKMBINDEX, TROPONINI in the last 168 hours. BNP (last 3 results) No results for input(s): PROBNP in the last 8760 hours. HbA1C: No results for input(s): HGBA1C in the last 72 hours. CBG: Recent Labs  Lab 05/25/19 1947 05/25/19 2311 05/26/19 0331 05/26/19 0842 05/26/19 1229  GLUCAP 310* 326* 253* 174* 94   Lipid Profile: No results for input(s): CHOL, HDL, LDLCALC, TRIG, CHOLHDL, LDLDIRECT in the last 72 hours. Thyroid Function Tests: No results for input(s): TSH, T4TOTAL, FREET4, T3FREE, THYROIDAB in the  last 72 hours. Anemia Panel: Recent Labs    05/25/19 0100 05/26/19 0120  FERRITIN 1,081* 927*   Urine analysis:    Component Value Date/Time   COLORURINE YELLOW 05/28/2016 2056   APPEARANCEUR CLEAR 05/28/2016 2056   LABSPEC 1.041 (H) 05/28/2016 2056   PHURINE 5.0 05/28/2016 2056   GLUCOSEU >1000 (A) 05/28/2016 2056   HGBUR NEGATIVE 05/28/2016 2056   BILIRUBINUR negative 06/19/2018 1601   KETONESUR negative 06/19/2018 1601   KETONESUR 15 (A) 05/28/2016 2056   PROTEINUR negative 06/19/2018 1601   PROTEINUR NEGATIVE 05/28/2016 2056   UROBILINOGEN 0.2 06/19/2018 1601   NITRITE Negative 06/19/2018 1601   NITRITE NEGATIVE 05/28/2016 2056   LEUKOCYTESUR Negative 06/19/2018 1601   Sepsis Labs: @LABRCNTIP (procalcitonin:4,lacticacidven:4)  ) Recent Results (from the past 240 hour(s))  Culture, respiratory (non-expectorated)     Status: None   Collection Time: 05/19/19  9:44 AM  Result Value Ref Range Status   Specimen Description   Final    TRACHEAL ASPIRATE Performed at [facility], [address] [place], [place] [address]    Special Requests   Final    NONE Performed at [facility], [address] [place], [place] [address]    Gram Stain   Final    RARE WBC PRESENT, PREDOMINANTLY PMN RARE GRAM POSITIVE COCCI    Culture   Final    FEW Consistent with normal respiratory flora. Performed at [facility], [address] [place], [place] [address]    Report Status 05/21/2019 FINAL  Final  Culture, blood (Routine X 2) w Reflex to ID Panel     Status: None   Collection Time: 05/19/19  9:44 AM  Result Value Ref Range Status   Specimen Description   Final    RIGHT ANTECUBITAL Performed at [facility], [address] [place], [place] [address]    Special Requests   Final    BOTTLES DRAWN AEROBIC ONLY Blood Culture adequate volume Performed at [facility], [address]  [place], [place] [address]    Culture   Final    NO GROWTH 5 DAYS Performed at [facility], [address] [place], [place] [address]  Report Status 05/24/2019 FINAL  Final  Culture, blood (Routine X 2) w Reflex to ID Panel     Status: None   Collection Time: 05/19/19  9:49 AM  Result Value Ref Range Status   Specimen Description   Final    BLOOD RIGHT HAND Performed at [facility], [address] [place], [place] [address]    Special Requests   Final    BOTTLES DRAWN AEROBIC ONLY Blood Culture adequate volume Performed at [facility], [address], [place], [place] [address]    Culture   Final    NO GROWTH 5 DAYS Performed at [facility], [address], [place], [place] [address]    Report Status 05/24/2019 FINAL  Final         Radiology Studies: Dg Chest Port 1 View  Result Date: 05/26/2019 CLINICAL DATA:  Hypoxia.  Reported COVID-19 positive EXAM: PORTABLE CHEST 1 VIEW COMPARISON:  May 19, 2019 chest radiograph and chest CT May 15, 2019 FINDINGS: Endotracheal tube tip is 4.5 cm above the carina. Central catheter tip is in the superior vena cava. No pneumothorax. There is patchy airspace opacity in each mid and lower lung zone. Heart is mildly enlarged with pulmonary vascularity normal. No adenopathy. There are foci of diaphragmatic calcification as well as scattered pleural plaques. IMPRESSION: Tube and catheter positions as described without pneumothorax. Evidence of previous asbestos exposure with pleural plaques and diaphragmatic calcification. Patchy airspace opacity bilaterally is similar to most recent study and is consistent with atypical organism pneumonia. Appearance similar to most recent study. Electronically Signed   By: Bretta Bang III M.D.   On: 05/26/2019 13:41        Scheduled Meds:  chlorhexidine  15 mL Mouth/Throat BID   Chlorhexidine Gluconate Cloth  6 each Topical q morning - 10a    clonazepam  1 mg Oral BID   enoxaparin (LOVENOX) injection  50 mg Subcutaneous Q12H   famotidine  20 mg Oral BID   feeding supplement (GLUCERNA 1.2 CAL)  1,000 mL Per Tube Q24H   feeding supplement (PRO-STAT SUGAR FREE 64)  60 mL Per Tube TID   free water  100 mL Per Tube Q4H   guaiFENesin-dextromethorphan  15 mL Oral Q6H   insulin aspart  0-20 Units Subcutaneous Q4H   insulin aspart  6 Units Subcutaneous Q4H   insulin glargine  30 Units Subcutaneous BID   ipratropium-albuterol  3 mL Nebulization Q6H   mouth rinse  15 mL Mouth Rinse 10 times per day   multivitamin  15 mL Oral Daily   nutrition supplement (JUVEN)  1 packet Per Tube BID BM   QUEtiapine  50 mg Oral QHS   sodium chloride flush  10-40 mL Intracatheter Q12H   Continuous Infusions:  sodium chloride 999 mL/hr at 05/26/19 0606   ceFEPime (MAXIPIME) IV 2 g (05/26/19 0920)   dexmedetomidine (PRECEDEX) IV infusion Stopped (05/26/19 0538)   fluconazole (DIFLUCAN) IV 100 mg (05/25/19 1543)   midazolam 1 mg/hr (05/26/19 1359)   morphine 6 mg/hr (05/26/19 0900)   phenylephrine (NEO-SYNEPHRINE) Adult infusion Stopped (05/26/19 0800)   vancomycin       LOS: 12 days    Time spent:      During this encounter: Patient Isolation: Airborne, contact, droplet HCP PPE: Face shield, head covering, N95, gown, gloves, and shoe covers    Alberteen Sam, MD [facility] 05/26/2019, 2:21 PM     Please page through AMION:  [email or web address] Password [email or web address] If 7PM-7AM, please contact night-coverage

## 2019-05-26 NOTE — Progress Notes (Signed)
Eula Fried, patient's granddaughter and gave her updates on patient. Answered her questions regarding patient. She said she may call him later today. Told her I would update if there were any changes.

## 2019-05-26 NOTE — Progress Notes (Signed)
ANTICOAGULATION CONSULT NOTE - Initial Consult  Pharmacy Consult for Heparin Indication: Rule out VTE in the setting of COVID PNA  No Known Allergies  Patient Measurements: Height: 5\' 5"  (165.1 cm) Weight: (scale inaccurate on bed) IBW/kg (Calculated) : 61.5 Heparin Dosing Weight: 85.5 kg  Vital Signs: Temp: 99.8 F (37.7 C) (06/07 1527) Temp Source: Axillary (06/07 1527) BP: 135/78 (06/07 1618) Pulse Rate: 137 (06/07 1618)  Labs: Recent Labs    05/24/19 0130  05/25/19 0100 05/26/19 0120 05/26/19 0857  HGB 13.4   < > 13.8 15.0 13.9  HCT 41.0   < > 43.1 46.1 41.0  PLT 185  --  209 298  --   CREATININE 0.60*  --  0.63 0.77  --    < > = values in this interval not displayed.    Estimated Creatinine Clearance: 105.7 mL/min (by C-G formula based on SCr of 0.77 mg/dL).   Medical History: Past Medical History:  Diagnosis Date  . Cataract   . Diabetes mellitus without complication ([place])   . Hyperlipidemia   . Hypertension   . Pleural plaque 10/05/2015   calcified pleural plaque bilaterally on CT chest    Medications:  Scheduled:  . chlorhexidine  15 mL Mouth/Throat BID  . Chlorhexidine Gluconate Cloth  6 each Topical q morning - 10a  . clonazepam  1 mg Oral BID  . enoxaparin (LOVENOX) injection  50 mg Subcutaneous Q12H  . famotidine  20 mg Oral BID  . feeding supplement (GLUCERNA 1.2 CAL)  1,000 mL Per Tube Q24H  . feeding supplement (PRO-STAT SUGAR FREE 64)  60 mL Per Tube TID  . free water  100 mL Per Tube Q4H  . guaiFENesin-dextromethorphan  15 mL Oral Q6H  . insulin aspart  0-20 Units Subcutaneous Q4H  . insulin aspart  6 Units Subcutaneous Q4H  . insulin glargine  30 Units Subcutaneous BID  . ipratropium-albuterol  3 mL Nebulization Q6H  . mouth rinse  15 mL Mouth Rinse 10 times per day  . multivitamin  15 mL Oral Daily  . nutrition supplement (JUVEN)  1 packet Per Tube BID BM  . QUEtiapine  50 mg Oral QHS  . sodium chloride flush  10-40 mL  Intracatheter Q12H   Infusions:  . sodium chloride 999 mL/hr at 05/26/19 0606  . ceFEPime (MAXIPIME) IV 2 g (05/26/19 1718)  . dexmedetomidine (PRECEDEX) IV infusion Stopped (05/26/19 0538)  . fluconazole (DIFLUCAN) IV 100 mg (05/25/19 1543)  . midazolam 3 mg/hr (05/26/19 1740)  . morphine 6 mg/hr (05/26/19 0900)  . phenylephrine (NEO-SYNEPHRINE) Adult infusion Stopped (05/26/19 0800)  . vancomycin     PRN: sodium chloride, acetaminophen, metoprolol tartrate, morphine, ondansetron (ZOFRAN) IV, polyethylene glycol, polyvinyl alcohol, sodium chloride flush  Assessment: 63 yo male admitted 06/06/2019 with acute respiratory failure with hypoxemia due to COVID-19 pneumonia. Pharmacy is now consulted to begin full dose IV heparin for VTE treatment. Of note, patient was briefly on IV heparin from 5/29-6/1, then was changed to VTE prophylaxis with lovenox 0.5mg /kg q12h, most recent dose today (6/7) at 09:22.  Goal of Therapy:  Heparin level 0.3-0.7 units/ml Monitor platelets by anticoagulation protocol: Yes   Plan:   No heparin bolus since received lovenox this AM  Heparin IV infusion 1250 units/hr  Check heparin level in 8hrs  Daily heparin level and CBC  Monitor closely for signs/symptoms of bleeding  Peggyann Juba, PharmD, BCPS Pharmacy: [phone] 05/26/2019,5:51 PM

## 2019-05-26 NOTE — Progress Notes (Signed)
Patient very agitated after bath. RASS a 3. PRN bolus of morphine given. Morphine and Precedex maxed out. HR 140-180's. O2 sats decreasing to mid 80's. FiO2 increased to 50%. Patient suctioned. PRN metoprolol given. Still no change after 30 minutes. MD paged. Received order for 1x dose of  2mg  versed   0300 update Patient RASS now at -3. Resting comfortably. Synchronous with vent. VS stable. Titrating meds down.

## 2019-05-26 NOTE — Progress Notes (Signed)
Paged Dr Loleta Books concerning EEG order and need for neuro consult prior to EEG; he will page Dr Lorraine Lax.

## 2019-05-26 NOTE — Progress Notes (Signed)
Patient transferred on vent to room 449-2 without complications.

## 2019-05-26 NOTE — Progress Notes (Signed)
CRITICAL VALUE ALERT  Critical Value:  Lactic Acid 2.7  Date & Time Notied: 05/26/2019 1330  Provider Notified: Dr. Loleta Books Text page and phone call  Orders Received/Actions taken: Awaiting Orders

## 2019-05-26 NOTE — Progress Notes (Signed)
Pharmacy Antibiotic Note  Lance Mason is a 63 y.o. male admitted on 04/20/2019. Pharmacy has been consulted for Cefepime and Vancomycin dosing for sepsis.    Plan:  Cefepime 2g IV q8h  Vancomycin 2g IV x1, followed by 1250 mg IV q12h.  Goal AUC = 400 - 550.  Expected AUC 497 using AdjBW and SCr 0.77  Follow up renal function, culture results, and clinical course.   Height: 5\' 5"  (165.1 cm) Weight: (scale inaccurate on bed) IBW/kg (Calculated) : 61.5  Temp (24hrs), Avg:99.3 F (37.4 C), Min:99 F (37.2 C), Max:99.8 F (37.7 C)  Recent Labs  Lab 05/22/19 0150 05/23/19 0220 05/24/19 0130 05/25/19 0100 05/26/19 0120  WBC 10.9* 14.2* 13.6* 14.4* 25.9*  CREATININE 0.64 0.58* 0.60* 0.63 0.77    Estimated Creatinine Clearance: 105.7 mL/min (by C-G formula based on SCr of 0.77 mg/dL).    No Known Allergies  Antimicrobials this admission: 5/26 cefepime >>6/2, resumed 6/7 >>  5/26 zmax >> 6/2 5/26 vancomycin >>5/28, resumed 6/7 >>  5/27 Actemra  5/27 Remdesivir >> 5/31 5/29 Convalescent plasma  6/2 Fluconazole >> (6/8)  Dose adjustments this admission:   Microbiology results: 5/26 BCx2L: ngtd 5/20 COVID positive  5/27 MRSA PCR: neg  5/31 TA: rare GPC 5/31 BCx: NGF 6/7 TA:  6/7 MRSA PCR:  6/7 BCx:   Thank you for allowing pharmacy to be a part of this patient's care.  Gretta Arab PharmD, BCPS Clinical Pharmacist Clinical pharmacist phone 7am- 5pm: [phone] 05/26/2019 9:55 AM

## 2019-05-26 NOTE — Progress Notes (Signed)
NAME:  Lance Mason, MRN:  [id number], DOB:  [birth year], LOS: 12 ADMISSION DATE:  05/10/2019, CONSULTATION DATE: May 15, 2019 REFERRING MD: Dr. Roderic Palau, CHIEF COMPLAINT: Dyspnea  Brief History   63 year old male with diabetes type 2, hypertension admitted on May 26 with acute respiratory failure with hypoxemia due to COVID-19 pneumonia.   Past Medical History  Hypertension Hyperlipidemia DM2  Significant Hospital Events   May 26 admission May 29 intubation, mechanical ventilation in setting of profound encephalopathy May 31 worsening dyssynchrony, hypoxemia, added vecuronium June 1 chemical paralysis used overnight  June 2-5 weaning FiO2/PEEP June 6 weaning well on pressure support  Consults:  Pulmonary and critical care medicine  Procedures:  Endotracheal tube May 29 > L subclavian CVL May 29 >   Significant Diagnostic Tests:  CT angiogram chest May 27 diffuse bilateral patchy airspace disease groundglass upper lobes predominant, no pulmonary embolism June 1 bilateral lower ext doppler > neg DVT June 1 TTE >>>  Micro Data:  May 21 SARS-CoV-2 positive May 26 blood cultures  Antimicrobials/COVID treatment  May 26 azithromycin > June 2 May 27 cefepime > June 2 May 27 vancomycin > May 31 June 2 Fluconazole >  May 27 remdesivir May 27 Tocilizumab x1 5/27 May 29 Convalescent plasma   June 7 Vanc >    Interim history/subjective:   Tachycardia overnight, low-grade temperature, worsening hypoxemia, thick secretions from endotracheal tube, slightly hypotensive.  Objective   Blood pressure (!) 161/107, pulse (!) 149, temperature 99.4 F (37.4 C), temperature source Axillary, resp. rate (!) 30, height 5\' 5"  (1.651 m), weight 105.6 kg, SpO2 93 %. CVP:  [1 mmHg-2 mmHg] 2 mmHg  Vent Mode: PRVC FiO2 (%):  [40 %-50 %] 50 % Set Rate:  [24 bmp] 24 bmp Vt Set:  [490 mL] 490 mL PEEP:  [5 cmH20] 5 cmH20 Pressure Support:  [5 cmH20] 5 cmH20 Plateau Pressure:  [19  cmH20-24 cmH20] 24 cmH20   Intake/Output Summary (Last 24 hours) at 05/26/2019 0800 Last data filed at 05/26/2019 0700 Gross per 24 hour  Intake 2413.26 ml  Output 4375 ml  Net -1961.74 ml   Filed Weights    Examination:  General:  In bed on vent HENT: NCAT ETT in place PULM: CTA B, vent supported breathing CV: RRR, no mgr GI: BS+, soft, nontender MSK: normal bulk and tone Neuro: sedated on vent   June 7 chest x-ray images independently reviewed showing bibasilar opacification, airspace disease   Resolved Hospital Problem list     Assessment & Plan:  ARDS due to COVID pneumonia: Oxygenation worse June 7, concern for healthcare associated pneumonia versus worsening COVID Resume full mechanical ventilatory support, Target tidal volume 6 to 8 cc/kg ideal body weight, target plateau pressure less than 30 PaO2 to FiO2 ratio target greater than 150 Hold diuresis today as appears to be septic See healthcare associated pneumonia below Ventilator associated pneumonia prevention protocol Hold pressure support weaning today as oxygenation is worsening Continue sedation as needed to maintain RA SS target  Concern for healthcare associated pneumonia on May 26, 2019: Rising white blood cell count, secretions, worsening oxygenation Respiratory culture Chest x-ray Add back vancomycin and cefepime  Need for sedation/ventilator synchrony RA SS goal 0 to -1 Continue to use morphine and Precedex infusions as needed to maintain ventilator synchrony and target RA SS goal Continue Seroquel  Best practice:  Diet: Tube feeding Pain/Anxiety/Delirium protocol (if indicated): yes, as above VAP protocol (if indicated): yes DVT prophylaxis: lovenox bid per  COVID protocol GI prophylaxis: famotidine Glucose control: SSI Mobility: bed rest, passive range of motion in BED reasonable Code Status: full Family [facility] Dis[facility]: remain in [facility]  Labs   CBC: Recent Labs  Lab  05/22/19 0150 05/23/19 0220  05/24/19 0130 05/24/19 0805 05/24/19 1213 05/25/19 0100 05/26/19 0120  WBC 10.9* 14.2*  --  13.6*  --   --  14.4* 25.9*  HGB 12.9* 13.5   < > 13.4 13.6 14.6 13.8 15.0  HCT 39.9 40.7   < > 41.0 40.0 43.0 43.1 46.1  MCV 93.4 93.3  --  94.0  --   --  96.0 97.3  PLT 215 213  --  185  --   --  209 298   < > = values in this interval not displayed.    Basic Metabolic Panel: Recent Labs  Lab 05/22/19 0150 05/23/19 0220  05/24/19 0130 05/24/19 0805 05/24/19 1213 05/25/19 0100 05/26/19 0120  NA 136 135   < > 138 136 137 132* 137  K 4.0 4.0   < > 3.7 3.7 3.8 4.3 4.0  CL 93* 91*  --  90*  --   --  83* 91*  CO2 33* 31  --  38*  --   --  36* 30  GLUCOSE 365* 294*  --  139*  --   --  256* 342*  BUN 39* 34*  --  39*  --   --  55* 54*  CREATININE 0.64 0.58*  --  0.60*  --   --  0.63 0.77  CALCIUM 8.0* 8.5*  --  8.6*  --   --  7.9* 8.8*   < > = values in this interval not displayed.   GFR: Estimated Creatinine Clearance: 105.7 mL/min (by C-G formula based on SCr of 0.77 mg/dL). Recent Labs  Lab 05/19/19 1040 05/20/19 0230 05/21/19 0215  05/23/19 0220 05/24/19 0130 05/25/19 0100 05/26/19 0120  PROCALCITON 0.65 0.29 0.17  --   --   --   --   --   WBC  --   --  12.3*   < > 14.2* 13.6* 14.4* 25.9*   < > = values in this interval not displayed.    Liver Function Tests: Recent Labs  Lab 05/22/19 0150 05/23/19 0220 05/24/19 0130 05/25/19 0100 05/26/19 0120  AST 64* 55* 64* 58* 58*  ALT 69* 71* 93* 119* 128*  ALKPHOS 124 123 124 123 136*  BILITOT 0.6 0.6 0.4 0.7 0.7  PROT 6.2* 6.2* 5.9* 6.2* 6.4*  ALBUMIN 2.7* 2.9* 2.7* 2.8* 2.9*   No results for input(s): LIPASE, AMYLASE in the last 168 hours. No results for input(s): AMMONIA in the last 168 hours.  ABG    Component Value Date/Time   PHART 7.468 (H) 05/24/2019 1213   PCO2ART 53.3 (H) 05/24/2019 1213   PO2ART 72.0 (L) 05/24/2019 1213   HCO3 38.6 (H) 05/24/2019 1213   TCO2 40 (H)  05/24/2019 1213   ACIDBASEDEF 3.0 (H) 05/18/2019 1601   O2SAT 95.0 05/24/2019 1213     Coagulation Profile: No results for input(s): INR, PROTIME in the last 168 hours.  Cardiac Enzymes: Recent Labs  Lab 05/19/19 1040  TROPONINI 0.06*    HbA1C: Hemoglobin A1C  Date/Time Value Ref Range Status  06/19/2018 04:01 PM 9.1 (A) 4.0 - 5.6 % Final  01/02/2017 06:51 PM 12.8  Final   Hgb A1c MFr Bld  Date/Time Value Ref Range Status  05/23/2019 02:20 AM 9.9 (H)  4.8 - 5.6 % Final    Comment:    (NOTE) Pre diabetes:          5.7%-6.4% Diabetes:              >6.4% Glycemic control for   <7.0% adults with diabetes     CBG: Recent Labs  Lab 05/25/19 1211 05/25/19 1547 05/25/19 1947 05/25/19 2311 05/26/19 0331  GLUCAP 169* 200* 310* 326* 253*       Critical care time: 35 minutes The patient remains critically ill requiring mechanical ventilation, adjustment of continuous sedation medicines and management of multiple databases of information.  Multidisciplinary rounding coordinated by pulmonary critical care service in conjunction with hospitalist team.     Heber CarolinaBrent , MD  PCCM Pager: [phone] Cell: [phone] If no response, call [phone]

## 2019-05-26 NOTE — Progress Notes (Signed)
CRITICAL VALUE ALERT  Critical Value:  Lactic Acid 3.0  Date & Time Notied:  05/26/19  Provider Notified: Dr. Loleta Books Text page  Orders Received/Actions taken: Awaiting orders

## 2019-05-27 ENCOUNTER — Inpatient Hospital Stay (HOSPITAL_COMMUNITY): Payer: BLUE CROSS/BLUE SHIELD

## 2019-05-27 LAB — COMPREHENSIVE METABOLIC PANEL
ALT: 120 U/L — ABNORMAL HIGH (ref 0–44)
AST: 67 U/L — ABNORMAL HIGH (ref 15–41)
Albumin: 2.2 g/dL — ABNORMAL LOW (ref 3.5–5.0)
Alkaline Phosphatase: 101 U/L (ref 38–126)
Anion gap: 6 (ref 5–15)
BUN: 43 mg/dL — ABNORMAL HIGH (ref 8–23)
CO2: 34 mmol/L — ABNORMAL HIGH (ref 22–32)
Calcium: 7.9 mg/dL — ABNORMAL LOW (ref 8.9–10.3)
Chloride: 101 mmol/L (ref 98–111)
Creatinine, Ser: 0.52 mg/dL — ABNORMAL LOW (ref 0.61–1.24)
GFR calc Af Amer: >60 mL/min (ref >60)
GFR calc non Af Amer: >60 mL/min (ref >60)
Glucose, Bld: 217 mg/dL — ABNORMAL HIGH (ref 70–99)
Potassium: 3.6 mmol/L (ref 3.5–5.1)
Sodium: 141 mmol/L (ref 135–145)
Total Bilirubin: 0.5 mg/dL (ref 0.3–1.2)
Total Protein: 4.8 g/dL — ABNORMAL LOW (ref 6.5–8.1)

## 2019-05-27 LAB — LACTIC ACID, PLASMA: Lactic Acid, Venous: 2.4 mmol/L (ref 0.5–1.9)

## 2019-05-27 LAB — FERRITIN: Ferritin: 915 ng/mL — ABNORMAL HIGH (ref 24–336)

## 2019-05-27 LAB — CBC
HCT: 37.3 % — ABNORMAL LOW (ref 39.0–52.0)
Hemoglobin: 11.8 g/dL — ABNORMAL LOW (ref 13.0–17.0)
MCH: 31.1 pg (ref 26.0–34.0)
MCHC: 31.6 g/dL (ref 30.0–36.0)
MCV: 98.4 fL (ref 80.0–100.0)
Platelets: 149 10*3/uL — ABNORMAL LOW (ref 150–400)
RBC: 3.79 MIL/uL — ABNORMAL LOW (ref 4.22–5.81)
RDW: 14.4 % (ref 11.5–15.5)
WBC: 9.8 10*3/uL (ref 4.0–10.5)
nRBC: 0.2 % (ref 0.0–0.2)

## 2019-05-27 LAB — GLUCOSE, CAPILLARY
Glucose-Capillary: 151 mg/dL — ABNORMAL HIGH (ref 70–99)
Glucose-Capillary: 129 mg/dL — ABNORMAL HIGH (ref 70–99)
Glucose-Capillary: 151 mg/dL — ABNORMAL HIGH (ref 70–99)
Glucose-Capillary: 108 mg/dL — ABNORMAL HIGH (ref 70–99)
Glucose-Capillary: 106 mg/dL — ABNORMAL HIGH (ref 70–99)
Glucose-Capillary: 153 mg/dL — ABNORMAL HIGH (ref 70–99)

## 2019-05-27 LAB — HEPARIN LEVEL (UNFRACTIONATED): Heparin Unfractionated: 0.76 IU/mL — ABNORMAL HIGH (ref 0.30–0.70)

## 2019-05-27 LAB — POCT I-STAT 7, (LYTES, BLD GAS, ICA,H+H)
Acid-Base Excess: 8 mmol/L — ABNORMAL HIGH (ref 0.0–2.0)
Bicarbonate: 33.8 mmol/L — ABNORMAL HIGH (ref 20.0–28.0)
Calcium, Ion: 1.21 mmol/L (ref 1.15–1.40)
HCT: 35 % — ABNORMAL LOW (ref 39.0–52.0)
Hemoglobin: 11.9 g/dL — ABNORMAL LOW (ref 13.0–17.0)
O2 Saturation: 94 %
Patient temperature: 98.6
Potassium: 3.7 mmol/L (ref 3.5–5.1)
Sodium: 141 mmol/L (ref 135–145)
TCO2: 35 mmol/L — ABNORMAL HIGH (ref 22–32)
pCO2 arterial: 52 mmHg — ABNORMAL HIGH (ref 32.0–48.0)
pH, Arterial: 7.421 (ref 7.350–7.450)
pO2, Arterial: 72 mmHg — ABNORMAL LOW (ref 83.0–108.0)

## 2019-05-27 LAB — TSH: TSH: 2.999 u[IU]/mL (ref 0.350–4.500)

## 2019-05-27 LAB — D-DIMER, QUANTITATIVE: D-Dimer, Quant: 2.01 ug/mL-FEU — ABNORMAL HIGH (ref 0.00–0.50)

## 2019-05-27 LAB — TROPONIN I: Troponin I: <0.03 ng/mL (ref <0.03)

## 2019-05-27 LAB — C-REACTIVE PROTEIN: CRP: 0.8 mg/dL (ref <1.0)

## 2019-05-27 MED ORDER — ENOXAPARIN SODIUM 60 MG/0.6ML ~~LOC~~ SOLN
50.0000 mg | Freq: Two times a day (BID) | SUBCUTANEOUS | Status: DC
  Administered 2019-05-27 – 2019-06-02 (×12): 50 mg via SUBCUTANEOUS
  Filled 2019-05-27 (×12): qty 0.6

## 2019-05-27 MED ORDER — MIDAZOLAM HCL 2 MG/2ML IJ SOLN
1.0000 mg | INTRAMUSCULAR | Status: DC | PRN
  Administered 2019-05-27 – 2019-05-29 (×2): 2 mg via INTRAVENOUS
  Administered 2019-05-30 (×2): 1 mg via INTRAVENOUS
  Filled 2019-05-27 (×6): qty 2

## 2019-05-27 MED ORDER — CLONAZEPAM 0.5 MG PO TBDP
2.0000 mg | ORAL_TABLET | Freq: Two times a day (BID) | ORAL | Status: DC
  Administered 2019-05-27 – 2019-05-28 (×2): 2 mg via ORAL
  Filled 2019-05-27 (×2): qty 4

## 2019-05-27 MED ORDER — HYDROMORPHONE BOLUS VIA INFUSION
1.0000 mg | INTRAVENOUS | Status: DC | PRN
  Administered 2019-05-27 – 2019-05-28 (×2): 1 mg via INTRAVENOUS
  Filled 2019-05-27: qty 1

## 2019-05-27 MED ORDER — IPRATROPIUM-ALBUTEROL 0.5-2.5 (3) MG/3ML IN SOLN: 3.0000 mL | Freq: Four times a day (QID) | RESPIRATORY_TRACT | Status: DC | PRN

## 2019-05-27 MED ORDER — QUETIAPINE FUMARATE 50 MG PO TABS
100.0000 mg | ORAL_TABLET | Freq: Every day | ORAL | Status: DC
  Administered 2019-05-27: 100 mg via ORAL
  Filled 2019-05-27: qty 2

## 2019-05-27 MED ORDER — HALOPERIDOL LACTATE 5 MG/ML IJ SOLN
1.0000 mg | INTRAMUSCULAR | Status: DC | PRN
  Administered 2019-05-27: 4 mg via INTRAVENOUS
  Administered 2019-05-28: 2 mg via INTRAVENOUS
  Administered 2019-05-28 – 2019-05-29 (×2): 4 mg via INTRAVENOUS
  Filled 2019-05-27 (×3): qty 1

## 2019-05-27 MED ORDER — IOHEXOL 350 MG/ML SOLN
100.0000 mL | Freq: Once | INTRAVENOUS | Status: AC | PRN
  Administered 2019-05-27: 100 mL via INTRAVENOUS

## 2019-05-27 MED ORDER — SODIUM CHLORIDE 0.9 % IV SOLN
1.0000 mg/h | INTRAVENOUS | Status: DC
  Administered 2019-05-27: 2 mg/h via INTRAVENOUS
  Administered 2019-05-28: 2.5 mg/h via INTRAVENOUS
  Filled 2019-05-27 (×2): qty 5

## 2019-05-27 NOTE — Progress Notes (Addendum)
NAME:  Lance Mason, MRN:  [id number], DOB:  08-19-1956, LOS: 13 ADMISSION DATE:  05/11/2019, CONSULTATION DATE: May 15, 2019 REFERRING MD: Dr. Kerry HoughMemon, CHIEF COMPLAINT: Dyspnea  Brief History   63 year old male with diabetes type 2, hypertension admitted on May 26 with acute respiratory failure with hypoxemia due to COVID-19 pneumonia.  Past Medical History  Hypertension Hyperlipidemia DM2  Significant Hospital Events   May 26 admission May 29 intubation, mechanical ventilation in setting of profound encephalopathy May 31 worsening dyssynchrony, hypoxemia, added vecuronium June 1 chemical paralysis used overnight  June 2-5 weaning FiO2/PEEP June 6 weaning well on pressure support June 7 worsening oxygenation/tachycardia fever, started on antibiotics again  Consults:  Pulmonary and critical care medicine  Procedures:  Endotracheal tube May 29 > L subclavian CVL May 29 >   Significant Diagnostic Tests:  CT angiogram chest May 27 diffuse bilateral patchy airspace disease groundglass upper lobes predominant, no pulmonary embolism June 1 bilateral lower ext doppler > neg DVT June 1 TTE >>>  Micro Data:  May 21 SARS-CoV-2 positive May 26 blood cultures June 7 resp culture > GPC, GNR, GPR June 7 blood culture >   Antimicrobials/COVID treatment  May 26 azithromycin > June 2 May 27 cefepime > June 2 May 27 vancomycin > May 31 June 2 Fluconazole >  May 27 remdesivir May 27 Tocilizumab x1 5/27 May 29 Convalescent plasma   June 7 Vanc >  June 7 Cefepime >   Interim history/subjective:   Off vasopressors Remains mechanically ventilated HR improved overnight Making urine Apparently followed commands overnight  Objective   Blood pressure (!) 80/55, pulse 96, temperature 98.6 F (37 C), temperature source Axillary, resp. rate (!) 24, height 5\' 5"  (1.651 m), weight 105.6 kg, SpO2 97 %. CVP:  [3 mmHg-7 mmHg] 3 mmHg  Vent Mode: PRVC FiO2 (%):  [70 %-100 %]  70 % Set Rate:  [24 bmp] 24 bmp Vt Set:  [490 mL] 490 mL PEEP:  [8 cmH20] 8 cmH20 Plateau Pressure:  [17 cmH20-28 cmH20] 26 cmH20   Intake/Output Summary (Last 24 hours) at 05/27/2019 0803 Last data filed at 05/27/2019 0715 Gross per 24 hour  Intake 3232.43 ml  Output 2595 ml  Net 637.43 ml   Filed Weights    Examination:  General:  In bed on vent HENT: NCAT ETT in place PULM: CTA B, vent supported breathing CV: RRR, no mgr GI: BS+, soft, nontender MSK: normal bulk and tone Neuro: sedated on vent    June 7 chest x-ray images independently reviewed showing bibasilar opacification, airspace disease   Resolved Hospital Problem list     Assessment & Plan:  ARDS due to COVID pneumonia: Oxygenation worse June 7, concern for healthcare associated pneumonia versus worsening COVID Follow respiratory cultures Continue broad-spectrum antibiotics Continue full mechanical ventilatory support, Target tidal volume 6 to 8 cc/kg ideal body weight, keep plateau pressure less than 30 cmH2O Ventilator associated pneumonia prevention protocol Wean PEEP and FiO2 to maintain O2 saturation greater than 88% Continue sedation as needed to maintain RA SS target  Concern for healthcare associated pneumonia on May 26, 2019: Rising white blood cell count, secretions, worsening oxygenation As above Continue to monitor culture  Tachycardia, hypoxemia: possible PE? CT angiogram chest today Continue heparin infusion for now  Need for sedation/ventilator synchrony RASS goal 0 to -1 Continue morphine, change versed infusion to prn increase clonazeapm Increase seroquel   Best practice:  Diet: Tube feeding Pain/Anxiety/Delirium protocol (if indicated): yes, as above  VAP protocol (if indicated): yes DVT prophylaxis: lovenox bid per COVID protocol GI prophylaxis: famotidine Glucose control: SSI Mobility: bed rest, passive range of motion in BED reasonable Code Status: full Family  Communication: per [facility] Disposition: remain in ICU  Labs   CBC: Recent Labs  Lab 05/23/19 0220  05/24/19 0130  05/24/19 1213 05/25/19 0100 05/26/19 0120 05/26/19 0857 05/27/19 0150  WBC 14.2*  --  13.6*  --   --  14.4* 25.9*  --  9.8  HGB 13.5   < > 13.4   < > 14.6 13.8 15.0 13.9 11.8*  HCT 40.7   < > 41.0   < > 43.0 43.1 46.1 41.0 37.3*  MCV 93.3  --  94.0  --   --  96.0 97.3  --  98.4  PLT 213  --  185  --   --  209 298  --  149*   < > = values in this interval not displayed.    Basic Metabolic Panel: Recent Labs  Lab 05/23/19 0220  05/24/19 0130  05/24/19 1213 05/25/19 0100 05/26/19 0120 05/26/19 0857 05/27/19 0150  NA 135   < > 138   < > 137 132* 137 137 141  K 4.0   < > 3.7   < > 3.8 4.3 4.0 3.7 3.6  CL 91*  --  90*  --   --  83* 91*  --  101  CO2 31  --  38*  --   --  36* 30  --  34*  GLUCOSE 294*  --  139*  --   --  256* 342*  --  217*  BUN 34*  --  39*  --   --  55* 54*  --  43*  CREATININE 0.58*  --  0.60*  --   --  0.63 0.77  --  0.52*  CALCIUM 8.5*  --  8.6*  --   --  7.9* 8.8*  --  7.9*   < > = values in this interval not displayed.   GFR: Estimated Creatinine Clearance: 105.7 mL/min (A) (by C-G formula based on SCr of 0.52 mg/dL (L)). Recent Labs  Lab 05/21/19 0215  05/24/19 0130 05/25/19 0100 05/26/19 0120 05/26/19 1140 05/26/19 1613 05/27/19 0150  PROCALCITON 0.17  --   --   --   --   --   --   --   WBC 12.3*   < > 13.6* 14.4* 25.9*  --   --  9.8  LATICACIDVEN  --   --   --   --   --  2.7* 3.0*  --    < > = values in this interval not displayed.    Liver Function Tests: Recent Labs  Lab 05/23/19 0220 05/24/19 0130 05/25/19 0100 05/26/19 0120 05/27/19 0150  AST 55* 64* 58* 58* 67*  ALT 71* 93* 119* 128* 120*  ALKPHOS 123 124 123 136* 101  BILITOT 0.6 0.4 0.7 0.7 0.5  PROT 6.2* 5.9* 6.2* 6.4* 4.8*  ALBUMIN 2.9* 2.7* 2.8* 2.9* 2.2*   No results for input(s): LIPASE, AMYLASE in the last 168 hours. No results for input(s): AMMONIA in  the last 168 hours.  ABG    Component Value Date/Time   PHART 7.479 (H) 05/26/2019 0857   PCO2ART 48.8 (H) 05/26/2019 0857   PO2ART 59.0 (L) 05/26/2019 0857   HCO3 36.1 (H) 05/26/2019 0857   TCO2 38 (H) 05/26/2019 0857   ACIDBASEDEF 3.0 (H) 05/18/2019 1601  O2SAT 91.0 05/26/2019 0857     Coagulation Profile: No results for input(s): INR, PROTIME in the last 168 hours.  Cardiac Enzymes: No results for input(s): CKTOTAL, CKMB, CKMBINDEX, TROPONINI in the last 168 hours.  HbA1C: Hemoglobin A1C  Date/Time Value Ref Range Status  06/19/2018 04:01 PM 9.1 (A) 4.0 - 5.6 % Final  01/02/2017 06:51 PM 12.8  Final   Hgb A1c MFr Bld  Date/Time Value Ref Range Status  05/23/2019 02:20 AM 9.9 (H) 4.8 - 5.6 % Final    Comment:    (NOTE) Pre diabetes:          5.7%-6.4% Diabetes:              >6.4% Glycemic control for   <7.0% adults with diabetes     CBG: Recent Labs  Lab 05/26/19 1229 05/26/19 1640 05/26/19 1938 05/26/19 2334 05/27/19 0347  GLUCAP 94 149* 156* 183* 151*       Critical care time: 31 minutes The patient remains critically ill requiring mechanical ventilation, adjustment of continuous sedation medicines and management of multiple databases of information.  Multidisciplinary rounding coordinated by pulmonary critical care service in conjunction with hospitalist team.     Heber CarolinaBrent McQuaid, MD  PCCM Pager: [phone] Cell: [phone] If no response, call [phone]

## 2019-05-27 NOTE — Progress Notes (Signed)
ANTICOAGULATION CONSULT NOTE - Initial Consult  Pharmacy Consult for Heparin Indication: Rule out VTE in the setting of COVID PNA  No Known Allergies  Patient Measurements: Height: 5\' 5"  (165.1 cm) Weight: (scale inaccurate on bed) IBW/kg (Calculated) : 61.5 Heparin Dosing Weight: 85.5 kg  Vital Signs: Temp: 98.3 F (36.8 C) (06/07 2330) Temp Source: Axillary (06/07 2330) BP: 95/59 (06/08 0055) Pulse Rate: 97 (06/08 0055)  Labs: Recent Labs    05/25/19 0100 05/26/19 0120 05/26/19 0857 05/27/19 0150  HGB 13.8 15.0 13.9 11.8*  HCT 43.1 46.1 41.0 37.3*  PLT 209 298  --  149*  CREATININE 0.63 0.77  --   --     Estimated Creatinine Clearance: 105.7 mL/min (by C-G formula based on SCr of 0.77 mg/dL).   Medical History: Past Medical History:  Diagnosis Date  . Cataract   . Diabetes mellitus without complication ([place])   . Hyperlipidemia   . Hypertension   . Pleural plaque 10/05/2015   calcified pleural plaque bilaterally on CT chest    Medications:  Scheduled:  . chlorhexidine  15 mL Mouth/Throat BID  . Chlorhexidine Gluconate Cloth  6 each Topical q morning - 10a  . clonazepam  1 mg Oral BID  . famotidine  20 mg Oral BID  . feeding supplement (GLUCERNA 1.2 CAL)  1,000 mL Per Tube Q24H  . feeding supplement (PRO-STAT SUGAR FREE 64)  60 mL Per Tube TID  . free water  100 mL Per Tube Q4H  . guaiFENesin-dextromethorphan  15 mL Oral Q6H  . insulin aspart  0-20 Units Subcutaneous Q4H  . insulin aspart  6 Units Subcutaneous Q4H  . insulin glargine  30 Units Subcutaneous BID  . ipratropium-albuterol  3 mL Nebulization Q6H  . mouth rinse  15 mL Mouth Rinse 10 times per day  . multivitamin  15 mL Oral Daily  . nutrition supplement (JUVEN)  1 packet Per Tube BID BM  . QUEtiapine  50 mg Oral QHS  . sodium chloride flush  10-40 mL Intracatheter Q12H   Infusions:  . sodium chloride 999 mL/hr at 05/26/19 0606  . ceFEPime (MAXIPIME) IV 2 g (05/27/19 0112)  .  dexmedetomidine (PRECEDEX) IV infusion Stopped (05/26/19 0538)  . heparin 1,250 Units/hr (05/27/19 0000)  . midazolam 4 mg/hr (05/27/19 0000)  . morphine 6 mg/hr (05/27/19 0000)  . phenylephrine (NEO-SYNEPHRINE) Adult infusion Stopped (05/26/19 0800)  . vancomycin Stopped (05/26/19 2352)   PRN: sodium chloride, acetaminophen, metoprolol tartrate, morphine, ondansetron (ZOFRAN) IV, polyethylene glycol, polyvinyl alcohol, sodium chloride flush  Assessment: 63 yo male admitted 05-19-2019 with acute respiratory failure with hypoxemia due to COVID-19 pneumonia. Pharmacy is now consulted to begin full dose IV heparin for VTE treatment. Of note, patient was briefly on IV heparin from 5/29-6/1, then was changed to VTE prophylaxis with lovenox 0.5mg /kg q12h, most recent dose today (6/7) at 09:22.  Heparin level slightly elevated at 0.76. Hgb 11.8, plts 149  Goal of Therapy:  Heparin level 0.3-0.7 units/ml Monitor platelets by anticoagulation protocol: Yes   Plan:  Decrease heparin gtt to 1,100 units/hr Check 6 hr heparin level Monitor daily heparin level, CBC, s/s of bleed  Elenor Quinones, PharmD, BCPS, BCIDP Clinical Pharmacist 05/27/2019 3:00 AM

## 2019-05-27 NOTE — Progress Notes (Signed)
Spoke with patient's grand daughter. Updated on patient's condition and plan of care. Questions answered.

## 2019-05-27 NOTE — Progress Notes (Signed)
PROGRESS NOTE    Lance Mason  ZOX:[id number] DOB: 1956-09-14 DOA: July 01, 2019 PCP: Patient, No Pcp Per      Brief Narrative:  Lance Mason is a 63 y.o. M with DM, obesity, and HTN who presented with 1 week shortness of breath and cough, then fever and worsening SOB.  In the ER, SpO2 84%, required non-rebreather to maintain O2 sat >90%.  CXR with bilateral opacities.    Intubated after 3 days.     Assessment & Plan:  Coronavirus pneumonitis with acute hypoxic respiratory failure In setting of ongoing 2020 COVID-19 pandemic.  Completed azithromycin 8 days, cefepime 7 days Convalescent plasma on 5/29 S/p Actemra on 5/27 S/p remdesivir 5 days, 5/27 to 5/31  Inflammatory markers elevated moderately, no change from previous.  -Continue Lovenox for VTE PPx  -Continue daily d-dimer, ferritin and CRP -Continue scheduled bronchodilators  -Continue enteral sedation with Seroquel     -Stop scheduled beta agonist    COVID-19 Labs Recent Labs    05/25/19 0100 05/26/19 0120 05/27/19 0150  DDIMER 2.53* 2.54* 2.01*  FERRITIN 1,081* 927* 915*  CRP 1.2* <0.8 0.8     Fever Tachycardia --> possible PE Fever and tachycardia 6/7, worsening leukocytosis, also new respiratory secretions suctioned by RT, worsening oxygenation. lactate elevated, etiology unclear.  No improvement in tachycardia with increase in sedation or fluids.  Empiric heparin started for PE.  Overnight, heart rate resolved with metoprolol. -Continue vancomycin and cefepime -Follow blood and sputum cultures -Obtain CT angiogram chest    Seizure-like activity Nursing noted a few minutes seizure like activity (clonic jerking of both arms, left leg) that were self-limited in a.m. 6/7, in context of tachycardia.  Patient subsequently agitated and moving all extremities.  No focal weakness.   Also, no further posturing, no further seizure like activity today.  Doubt status.  CT head 6/7  unremarkable, no further seizure activity. -Monitor for ongoing seizure activity, if so: we will start Keppra consult neurology, and order EEG  Diabetes A1c 9.9%.  Glucoses slightly elevated -Hold home metformin, glipizide -Continue Lantus, BID  -Continue scheduled insulin -Continue sliding scale corrections   Hypertension BP soft.  Not on home meds.  Oral candidiasis -Continue fluconazole, day 7 of 7  Hypernatremia Resolved      MDM and disposition: Below labs and imaging reports reviewed and summarized above.  Medication management as above.  The patient was admitted with acute on toxic respiratory failure from COVID-19.  He has a persistent acute metabolic encephalopathy, persistent transaminitis, and now new tachycardia and worsening oxygenation.  This is an acute illness that poses a severe threat to life and bodily function.  CT angiogram was ordered.  He has new tachycardia and worsening oxygenation for which above work-up is planned.        Ventilator best practice:  Diet: TFs VAP protocol (if indicated): Yes GI prophylaxis: Famotidine Glucose control: Insulin  Mobility: N/A  DVT prophylaxis: Lovenox Code Status: FULL     Consultants:   PCCM  Procedures:   5/29 ETT  5/29 L subclavian central line  Antimicrobials:   Azithromycin 5/26 >> 6/2  Cefepime 5/27 >> 6/2 and 6/7 >>  Fluconazole 6/2 >>  Remdesivir 5/27 >> 5/31  Vancomycin 5/26 >> 5/28  And 6/7 >>   Culture data:   MRSA nares swab-negative on admission  5/26 blood culture x1-no growth to date  5/31 blood culture x2 --no growth to date  5/31 respiratory culture- normal respiratory flora  6/7 blood culture x2 -- pending  6/7 sputum culture -- pending       Subjective: Intubated and sedated, overnight heart rate improved.  No further fever.  No further seizure activity.  No vent asynchrony.  No clinical bleeding.  Objective: Vitals:   05/27/19 0700 05/27/19 0715  05/27/19 0800 05/27/19 0804  BP: (!) 79/56 (!) 80/55 (!) 83/57 (!) 83/57  Pulse: 100 96 96 95  Resp: (!) 24 (!) 24 (!) 24 (!) 24  Temp:   98.9 F (37.2 C)   TempSrc:   Axillary   SpO2: 97% 97% 99% 99%  Height:        Intake/Output Summary (Last 24 hours) at 05/27/2019 7829 Last data filed at 05/27/2019 0800 Gross per 24 hour  Intake 3408.2 ml  Output 1820 ml  Net 1588.2 ml   Filed Weights    Examination: General appearance: Overweight adult male, intubated and sedated, supine. HEENT: Anicteric, conjunctival pink, lids and lashes normal.  No nasal deformity, discharge, or epistaxis.  Lips dry, ET tube in place. Skin: Skin warm and dry, no suspicious rashes or lesions. Cardiac: Tachycardic, regular, no murmurs, no lower extremity edema, JVP not visible.    Respiratory: On full mechanical ventilation, lung sounds clear bilaterally, coarse but no expiratory wheezing. Abdomen: Abdomen soft without rigidity, no grimace to palpation, no masses. Neuro/Psych: No jerking, appears sedated, pupils equal.   Data Reviewed: I have personally reviewed following labs and imaging studies:  CBC: Recent Labs  Lab 05/23/19 0220  05/24/19 0130  05/24/19 1213 05/25/19 0100 05/26/19 0120 05/26/19 0857 05/27/19 0150  WBC 14.2*  --  13.6*  --   --  14.4* 25.9*  --  9.8  HGB 13.5   < > 13.4   < > 14.6 13.8 15.0 13.9 11.8*  HCT 40.7   < > 41.0   < > 43.0 43.1 46.1 41.0 37.3*  MCV 93.3  --  94.0  --   --  96.0 97.3  --  98.4  PLT 213  --  185  --   --  209 298  --  149*   < > = values in this interval not displayed.   Basic Metabolic Panel: Recent Labs  Lab 05/23/19 0220  05/24/19 0130  05/24/19 1213 05/25/19 0100 05/26/19 0120 05/26/19 0857 05/27/19 0150  NA 135   < > 138   < > 137 132* 137 137 141  K 4.0   < > 3.7   < > 3.8 4.3 4.0 3.7 3.6  CL 91*  --  90*  --   --  83* 91*  --  101  CO2 31  --  38*  --   --  36* 30  --  34*  GLUCOSE 294*  --  139*  --   --  256* 342*  --  217*  BUN  34*  --  39*  --   --  55* 54*  --  43*  CREATININE 0.58*  --  0.60*  --   --  0.63 0.77  --  0.52*  CALCIUM 8.5*  --  8.6*  --   --  7.9* 8.8*  --  7.9*   < > = values in this interval not displayed.   GFR: Estimated Creatinine Clearance: 105.7 mL/min (A) (by C-G formula based on SCr of 0.52 mg/dL (L)). Liver Function Tests: Recent Labs  Lab 05/23/19 0220 05/24/19 0130 05/25/19 0100 05/26/19 0120 05/27/19 0150  AST 55*  64* 58* 58* 67*  ALT 71* 93* 119* 128* 120*  ALKPHOS 123 124 123 136* 101  BILITOT 0.6 0.4 0.7 0.7 0.5  PROT 6.2* 5.9* 6.2* 6.4* 4.8*  ALBUMIN 2.9* 2.7* 2.8* 2.9* 2.2*   No results for input(s): LIPASE, AMYLASE in the last 168 hours. No results for input(s): AMMONIA in the last 168 hours. Coagulation Profile: No results for input(s): INR, PROTIME in the last 168 hours. Cardiac Enzymes: No results for input(s): CKTOTAL, CKMB, CKMBINDEX, TROPONINI in the last 168 hours. BNP (last 3 results) No results for input(s): PROBNP in the last 8760 hours. HbA1C: No results for input(s): HGBA1C in the last 72 hours. CBG: Recent Labs  Lab 05/26/19 1640 05/26/19 1938 05/26/19 2334 05/27/19 0347 05/27/19 0759  GLUCAP 149* 156* 183* 151* 129*   Lipid Profile: No results for input(s): CHOL, HDL, LDLCALC, TRIG, CHOLHDL, LDLDIRECT in the last 72 hours. Thyroid Function Tests: No results for input(s): TSH, T4TOTAL, FREET4, T3FREE, THYROIDAB in the last 72 hours. Anemia Panel: Recent Labs    05/26/19 0120 05/27/19 0150  FERRITIN 927* 915*   Urine analysis:    Component Value Date/Time   COLORURINE YELLOW 05/28/2016 2056   APPEARANCEUR CLEAR 05/28/2016 2056   LABSPEC 1.041 (H) 05/28/2016 2056   PHURINE 5.0 05/28/2016 2056   GLUCOSEU >1000 (A) 05/28/2016 2056   HGBUR NEGATIVE 05/28/2016 2056   BILIRUBINUR negative 06/19/2018 1601   KETONESUR negative 06/19/2018 1601   KETONESUR 15 (A) 05/28/2016 2056   PROTEINUR negative 06/19/2018 1601   PROTEINUR NEGATIVE  05/28/2016 2056   UROBILINOGEN 0.2 06/19/2018 1601   NITRITE Negative 06/19/2018 1601   NITRITE NEGATIVE 05/28/2016 2056   LEUKOCYTESUR Negative 06/19/2018 1601   Sepsis Labs: (procalcitonin:4,lacticacidven:4)  ) Recent Results (from the past 240 hour(s))  Culture, respiratory (non-expectorated)     Status: None   Collection Time: 05/19/19  9:44 AM  Result Value Ref Range Status   Specimen Description   Final    TRACHEAL ASPIRATE Performed at [facility], [address] [place], [place] [address]    Special Requests   Final    NONE Performed at [facility], [address], [place], [place] [address]    Gram Stain   Final    RARE WBC PRESENT, PREDOMINANTLY PMN RARE GRAM POSITIVE COCCI    Culture   Final    FEW Consistent with normal respiratory flora. Performed at [facility], [address], [place], [place] [address]    Report Status 05/21/2019 FINAL  Final  Culture, blood (Routine X 2) w Reflex to ID Panel     Status: None   Collection Time: 05/19/19  9:44 AM  Result Value Ref Range Status   Specimen Description   Final    RIGHT ANTECUBITAL Performed at [facility], [address], [place], [place] [address]    Special Requests   Final    BOTTLES DRAWN AEROBIC ONLY Blood Culture adequate volume Performed at [facility], [address] [place], [place] [address]    Culture   Final    NO GROWTH 5 DAYS Performed at [facility], [address] [place], [place] [address]    Report Status 05/24/2019 FINAL  Final  Culture, blood (Routine X 2) w Reflex to ID Panel     Status: None   Collection Time: 05/19/19  9:49 AM  Result Value Ref Range Status   Specimen Description   Final  BLOOD RIGHT HAND Performed at [facility], [address] [place], [place] [address]    Special Requests   Final    BOTTLES DRAWN AEROBIC ONLY Blood Culture  adequate volume Performed at [facility], [address] [place], [place] [address]    Culture   Final    NO GROWTH 5 DAYS Performed at [facility], [address] [place], [place] [address]    Report Status 05/24/2019 FINAL  Final  Culture, respiratory (non-expectorated)     Status: None (Preliminary result)   Collection Time: 05/26/19  8:48 AM  Result Value Ref Range Status   Specimen Description   Final    TRACHEAL ASPIRATE Performed at [facility], [address] [place], [place] [address]    Special Requests   Final    NONE Performed at [facility], [address] [place], [place] [address]    Gram Stain   Final    FEW WBC PRESENT, PREDOMINANTLY PMN MODERATE GRAM POSITIVE COCCI MODERATE GRAM NEGATIVE RODS MODERATE GRAM POSITIVE RODS Performed at [facility], [address] [place], [place] [address]    Culture PENDING  Incomplete   Report Status PENDING  Incomplete         Radiology Studies: Ct Head Wo Contrast  Result Date: 05/26/2019 CLINICAL DATA:  Seizure EXAM: CT HEAD WITHOUT CONTRAST TECHNIQUE: Contiguous axial images were obtained from the base of the skull through the vertex without intravenous contrast. COMPARISON:  05/29/2016 FINDINGS: Brain: No evidence of acute infarction, hemorrhage, hydrocephalus, extra-axial collection or mass lesion/mass effect. Vascular: No hyperdense vessel or unexpected calcification. Skull: Normal. Negative for fracture or focal lesion. Sinuses/Orbits: No acute finding. Other: None. IMPRESSION: No acute intracranial pathology. Electronically Signed   By: Lauralyn PrimesAlex  Bibbey M.D.   On: 05/26/2019 16:25   Dg Chest Port 1 View  Result Date: 05/26/2019 CLINICAL DATA:  Hypoxia.  Reported COVID-19 positive EXAM: PORTABLE CHEST 1 VIEW COMPARISON:  May 19, 2019 chest radiograph and chest CT May 15, 2019 FINDINGS: Endotracheal tube tip is 4.5 cm above the carina. Central  catheter tip is in the superior vena cava. No pneumothorax. There is patchy airspace opacity in each mid and lower lung zone. Heart is mildly enlarged with pulmonary vascularity normal. No adenopathy. There are foci of diaphragmatic calcification as well as scattered pleural plaques. IMPRESSION: Tube and catheter positions as described without pneumothorax. Evidence of previous asbestos exposure with pleural plaques and diaphragmatic calcification. Patchy airspace opacity bilaterally is similar to most recent study and is consistent with atypical organism pneumonia. Appearance similar to most recent study. Electronically Signed   By: Bretta BangWilliam  Woodruff III M.D.   On: 05/26/2019 13:41        Scheduled Meds: . chlorhexidine  15 mL Mouth/Throat BID  . Chlorhexidine Gluconate Cloth  6 each Topical q morning - 10a  . clonazepam  1 mg Oral BID  . famotidine  20 mg Oral BID  . feeding supplement (GLUCERNA 1.2 CAL)  1,000 mL Per Tube Q24H  . feeding supplement (PRO-STAT SUGAR FREE 64)  60 mL Per Tube TID  . free water  100 mL Per Tube Q4H  . guaiFENesin-dextromethorphan  15 mL Oral Q6H  . insulin aspart  0-20 Units Subcutaneous Q4H  . insulin aspart  6 Units Subcutaneous Q4H  . insulin glargine  30 Units Subcutaneous BID  . mouth rinse  15 mL Mouth Rinse 10 times per day  .  multivitamin  15 mL Oral Daily  . nutrition supplement (JUVEN)  1 packet Per Tube BID BM  . QUEtiapine  50 mg Oral QHS  . sodium chloride flush  10-40 mL Intracatheter Q12H   Continuous Infusions: . sodium chloride 999 mL/hr at 05/26/19 0606  . ceFEPime (MAXIPIME) IV 2 g (05/27/19 0835)  . dexmedetomidine (PRECEDEX) IV infusion Stopped (05/26/19 0538)  . heparin 1,100 Units/hr (05/27/19 0800)  . midazolam 2 mg/hr (05/27/19 0800)  . morphine 5 mg/hr (05/27/19 0800)  . phenylephrine (NEO-SYNEPHRINE) Adult infusion 20 mcg/min (05/27/19 0800)  . vancomycin Stopped (05/26/19 2352)     LOS: 13 days    Time spent: 35  minutes    During this encounter: Patient Isolation: Airborne, contact, droplet HCP PPE: Face shield, head covering, N95, gown, gloves, and shoe covers    Alberteen Samhristopher P Keone Kamer, MD [facility] 05/27/2019, 9:07 AM     Please page through AMION:  [email or web address] Password [email or web address] If 7PM-7AM, please contact night-coverage

## 2019-05-27 NOTE — Progress Notes (Signed)
Notified Dr. Loleta Books and Dr. Lake Bells of the patient's increasing heart rate. Unable to decrease HR/BP with increasing morphine drip. Lactate currently being drawn. RN remains at the bedside.

## 2019-05-27 NOTE — Progress Notes (Signed)
[facility] PCCM  Persistent tachycardia today More awake, delirious, confused on my exam All other parameters today seem to be improving (oxygenation, renal function, WBC)  Plan Check lactic acid Change morphine to hydromorphone (suspect tachyphylaxis) Haldol prn  Roselie Awkward, MD [place] [facility] Pager: [phone] Cell: [phone] If no response, call [phone]

## 2019-05-28 ENCOUNTER — Other Ambulatory Visit: Payer: Self-pay

## 2019-05-28 LAB — COMPREHENSIVE METABOLIC PANEL
ALT: 117 U/L — ABNORMAL HIGH (ref 0–44)
AST: 69 U/L — ABNORMAL HIGH (ref 15–41)
Albumin: 2.2 g/dL — ABNORMAL LOW (ref 3.5–5.0)
Alkaline Phosphatase: 121 U/L (ref 38–126)
Anion gap: 6 (ref 5–15)
BUN: 34 mg/dL — ABNORMAL HIGH (ref 8–23)
CO2: 30 mmol/L (ref 22–32)
Calcium: 7.8 mg/dL — ABNORMAL LOW (ref 8.9–10.3)
Chloride: 104 mmol/L (ref 98–111)
Creatinine, Ser: 0.46 mg/dL — ABNORMAL LOW (ref 0.61–1.24)
GFR calc Af Amer: >60 mL/min (ref >60)
GFR calc non Af Amer: >60 mL/min (ref >60)
Glucose, Bld: 137 mg/dL — ABNORMAL HIGH (ref 70–99)
Potassium: 3.7 mmol/L (ref 3.5–5.1)
Sodium: 140 mmol/L (ref 135–145)
Total Bilirubin: 0.3 mg/dL (ref 0.3–1.2)
Total Protein: 4.8 g/dL — ABNORMAL LOW (ref 6.5–8.1)

## 2019-05-28 LAB — CULTURE, RESPIRATORY W GRAM STAIN: Culture: NORMAL

## 2019-05-28 LAB — POCT I-STAT TROPONIN I: Troponin i, poc: 0.01 ng/mL (ref 0.00–0.08)

## 2019-05-28 LAB — GLUCOSE, CAPILLARY
Glucose-Capillary: 142 mg/dL — ABNORMAL HIGH (ref 70–99)
Glucose-Capillary: 50 mg/dL — ABNORMAL LOW (ref 70–99)
Glucose-Capillary: 103 mg/dL — ABNORMAL HIGH (ref 70–99)
Glucose-Capillary: 71 mg/dL (ref 70–99)
Glucose-Capillary: 75 mg/dL (ref 70–99)
Glucose-Capillary: 99 mg/dL (ref 70–99)

## 2019-05-28 LAB — CBC
HCT: 35.8 % — ABNORMAL LOW (ref 39.0–52.0)
Hemoglobin: 11.2 g/dL — ABNORMAL LOW (ref 13.0–17.0)
MCH: 31.3 pg (ref 26.0–34.0)
MCHC: 31.3 g/dL (ref 30.0–36.0)
MCV: 100 fL (ref 80.0–100.0)
Platelets: 143 10*3/uL — ABNORMAL LOW (ref 150–400)
RBC: 3.58 MIL/uL — ABNORMAL LOW (ref 4.22–5.81)
RDW: 14.6 % (ref 11.5–15.5)
WBC: 6.7 10*3/uL (ref 4.0–10.5)
nRBC: 0 % (ref 0.0–0.2)

## 2019-05-28 LAB — D-DIMER, QUANTITATIVE: D-Dimer, Quant: 1.85 ug/mL-FEU — ABNORMAL HIGH (ref 0.00–0.50)

## 2019-05-28 LAB — FERRITIN: Ferritin: 785 ng/mL — ABNORMAL HIGH (ref 24–336)

## 2019-05-28 LAB — C-REACTIVE PROTEIN: CRP: 2.4 mg/dL — ABNORMAL HIGH (ref <1.0)

## 2019-05-28 MED ORDER — HYDROMORPHONE HCL 1 MG/ML IJ SOLN: 1.0000 mg | INTRAMUSCULAR | Status: DC | PRN

## 2019-05-28 MED ORDER — ACETAMINOPHEN 10 MG/ML IV SOLN
1000.0000 mg | Freq: Three times a day (TID) | INTRAVENOUS | Status: AC | PRN
  Administered 2019-05-28 – 2019-05-29 (×2): 1000 mg via INTRAVENOUS
  Filled 2019-05-28 (×2): qty 100

## 2019-05-28 MED ORDER — MIDAZOLAM HCL 2 MG/2ML IJ SOLN: 2.0000 mg | INTRAMUSCULAR | Status: DC | PRN

## 2019-05-28 MED ORDER — INSULIN GLARGINE 100 UNIT/ML ~~LOC~~ SOLN
15.0000 [IU] | Freq: Two times a day (BID) | SUBCUTANEOUS | Status: DC
  Administered 2019-05-29: 15 [IU] via SUBCUTANEOUS
  Filled 2019-05-28 (×4): qty 0.15

## 2019-05-28 MED ORDER — ORAL CARE MOUTH RINSE
15.0000 mL | Freq: Two times a day (BID) | OROMUCOSAL | Status: DC
  Administered 2019-05-28 – 2019-05-29 (×2): 15 mL via OROMUCOSAL

## 2019-05-28 MED ORDER — DEXTROSE 50 % IV SOLN
INTRAVENOUS | Status: AC
  Administered 2019-05-28: 25 mL
  Filled 2019-05-28: qty 50

## 2019-05-28 NOTE — Progress Notes (Signed)
[facility] P[facility]M  Repeat exam Confused but easily re-directs Respirations even, non-labored Follows commands Haldol worked well for delirium Change meds to IV overnight Keep NPO Monitor in ICU  Roselie Awkward, MD [place] [facility] Pager: [phone] Cell: [phone] If no response, call [phone]

## 2019-05-28 NOTE — Progress Notes (Signed)
Wasted 95 mls of dilauded with Evlyn Kanner, RN in [facility].

## 2019-05-28 NOTE — Progress Notes (Signed)
[facility] P[facility]M  Extubated today Respiratory status stable, oxygenation OK on high flow nasal cannual Confused Tachycardic  Plan Close monitoring in ICU Prn metoprolol  Roselie Awkward, MD [place] [facility]M Pager: [phone] Cell: [phone] If no response, call [phone]

## 2019-05-28 NOTE — Progress Notes (Signed)
PROGRESS NOTE    Lance Mason  GNF:[id number] DOB: 07/12/1956 DOA: 07-15-19 PCP: Patient, No Pcp Per      Brief Narrative:  Lance Mason is a 63 y.o. M with DM, obesity, and HTN who presented with 1 week shortness of breath and cough, then fever and worsening SOB.  In the ER, SpO2 84%, required non-rebreather to maintain O2 sat >90%.  CXR with bilateral opacities.    Intubated after 3 days.     Assessment & Plan:  Coronavirus pneumonitis with acute hypoxic respiratory failure In setting of ongoing 2020 COVID-19 pandemic.  Completed azithromycin 8 days, cefepime 7 days Convalescent plasma on 5/29 S/p Actemra on 5/27 S/p remdesivir 5 days, 5/27 to 5/31  Inflammatory markers down.  Afebril overnight.  HR improved overnight while sleeping, up this morning with nursing cares/sedation weaning.    Intubated 12 days -Continue Lovenox for VTE PPx -Continue daily d-dimer, ferritin and CRP  -Continue Enteral sedation with Seroquel per CCM  -Possible extubation today   COVID-19 Labs Recent Labs    05/26/19 0120 05/27/19 0150 05/28/19 0427  DDIMER 2.54* 2.01* 1.85*  FERRITIN 927* 915* 785*  CRP <0.8 0.8 2.4*     Sinus tachycardia Fever and tachycardia started 6/7, also associated at that time with leukocytosis.    Started on empiric antibiotics, but respiratory culture re-incubated for more growth.    Treated empirically for PE for 24 hours, but CTA obtained 6/8, report reviewed, and PE ruled out.  Tachycardia is not fluid responsive.  TSH normal.  Troponin normal, doubt ischemia or carditis.  AXR shows no free air, SBO or constipation.  Bladder decompressed with foley.  Over last three days, tachycardia resolves at night, returns during day.    Sputum culture normal respiratory flora.  Blood cultures no growth to date. -Stop antibiotics -Follow blood cultures -Monitor fever curve  Seizure-like activity Nursing noted a few minutes seizure like  activity (clonic jerking of both arms, left leg) that were self-limited in a.m. 6/7, in context of tachycardia.  Patient subsequently agitated and moving all extremities.  No focal weakness.   Also, no further posturing, no further seizure like activity today.  Doubt status.  CT head 6/7 unremarkable.  No further jerking.  Suspect this was agitation, not seizure.  Diabetes A1c 9.9%.  Hypoglycemic today. -Hold home metformin, glipizide -Continue Lantus, reduce dose -Hold scheduled insulin -Continue sliding scale corrections   Hypertension BP soft.  Not on home meds.  Oral candidiasis Completed 7 days fluconazole  Hypernatremia Resolved      MDM and disposition: The below labs and imaging reports were reviewed and summarized above.  Medication management as above.  The patient was admitted with acute on toxic respiratory failure from COVID-19.  He has a persistent acute metabolic encephalopathy, persistent transaminitis.  This is an acute illness that poses a severe threat to life and bodily function.           Ventilator best practice:  Diet: TFs VAP protocol (if indicated): Yes GI prophylaxis: Famotidine Glucose control: Insulin  Mobility: N/A  DVT prophylaxis: Lovenox Code Status: FULL     Consultants:   PCCM  Procedures:   5/29 ETT  5/29 L subclavian central line  Antimicrobials:   Azithromycin 5/26 >> 6/2  Cefepime 5/27 >> 6/2 and 6/7 >>  Fluconazole 6/2 >> 6/9  Remdesivir 5/27 >> 5/31  Vancomycin 5/26 >> 5/28  And 6/7 >>   Culture data:   MRSA nares  swab-negative on admission  5/26 blood culture x1-no growth to date  5/31 blood culture x2 --no growth to date  5/31 respiratory culture- normal respiratory flora  6/7 blood culture x2 --no growth to date  6/7 sputum culture --normal respiratory flora       Subjective: Intubated, more alert today.  No fever overnight.  No melena, no vomiting.  Objective: Vitals:    05/28/19 0600 05/28/19 0700 05/28/19 0802 05/28/19 0900  BP: 93/63  (!) 81/50 (!) 99/56  Pulse: (!) 115 (!) 141 (!) 105 (!) 163  Resp: (!) 24 (!) 24 (!) 24 (!) 22  Temp:      TempSrc:      SpO2: 95% 96% 96% 94%  Height:        Intake/Output Summary (Last 24 hours) at 05/28/2019 1037 Last data filed at 05/28/2019 0900 Gross per 24 hour  Intake 2498.44 ml  Output 2115 ml  Net 383.44 ml   Filed Weights    Examination: General appearance: Overweight adult male, intubated and sedated, supine.   HEENT: Anicteric, conjunctival pink, lids and lashes normal.  No nasal deformity, discharge, or epistaxis.  Lips dry, ET tube in place. Skin: Skin warm and dry, no suspicious rashes or lesions. Cardiac: Tachycardic, regular, no murmurs, no lower extremity edema, JVP not visible.    Respiratory: On full mechanical ventilation, lung sounds clear bilaterally, coarse but no expiratory wheezing. Abdomen: Abdomen soft without rigidity, no grimace to palpation, no masses. Neuro/Psych: Awake, makes eye contact, intermittently follows commands, moves all extremities.   Data Reviewed: I have personally reviewed following labs and imaging studies:  CBC: Recent Labs  Lab 05/24/19 0130  05/25/19 0100 05/26/19 0120 05/26/19 0857 05/27/19 0150 05/27/19 0918 05/28/19 0357  WBC 13.6*  --  14.4* 25.9*  --  9.8  --  6.7  HGB 13.4   < > 13.8 15.0 13.9 11.8* 11.9* 11.2*  HCT 41.0   < > 43.1 46.1 41.0 37.3* 35.0* 35.8*  MCV 94.0  --  96.0 97.3  --  98.4  --  100.0  PLT 185  --  209 298  --  149*  --  143*   < > = values in this interval not displayed.   Basic Metabolic Panel: Recent Labs  Lab 05/24/19 0130  05/25/19 0100 05/26/19 0120 05/26/19 0857 05/27/19 0150 05/27/19 0918 05/28/19 0427  NA 138   < > 132* 137 137 141 141 140  K 3.7   < > 4.3 4.0 3.7 3.6 3.7 3.7  CL 90*  --  83* 91*  --  101  --  104  CO2 38*  --  36* 30  --  34*  --  30  GLUCOSE 139*  --  256* 342*  --  217*  --  137*  BUN  39*  --  55* 54*  --  43*  --  34*  CREATININE 0.60*  --  0.63 0.77  --  0.52*  --  0.46*  CALCIUM 8.6*  --  7.9* 8.8*  --  7.9*  --  7.8*   < > = values in this interval not displayed.   GFR: Estimated Creatinine Clearance: 105.7 mL/min (A) (by C-G formula based on SCr of 0.46 mg/dL (L)). Liver Function Tests: Recent Labs  Lab 05/24/19 0130 05/25/19 0100 05/26/19 0120 05/27/19 0150 05/28/19 0427  AST 64* 58* 58* 67* 69*  ALT 93* 119* 128* 120* 117*  ALKPHOS 124 123 136* 101 121  BILITOT  0.4 0.7 0.7 0.5 0.3  PROT 5.9* 6.2* 6.4* 4.8* 4.8*  ALBUMIN 2.7* 2.8* 2.9* 2.2* 2.2*   No results for input(s): LIPASE, AMYLASE in the last 168 hours. No results for input(s): AMMONIA in the last 168 hours. Coagulation Profile: No results for input(s): INR, PROTIME in the last 168 hours. Cardiac Enzymes: Recent Labs  Lab 05/27/19 2018  TROPONINI <0.03   BNP (last 3 results) No results for input(s): PROBNP in the last 8760 hours. HbA1C: No results for input(s): HGBA1C in the last 72 hours. CBG: Recent Labs  Lab 05/27/19 1957 05/27/19 2326 05/28/19 0358 05/28/19 0820 05/28/19 0850  GLUCAP 106* 153* 142* 50* 103*   Lipid Profile: No results for input(s): CHOL, HDL, LDLCALC, TRIG, CHOLHDL, LDLDIRECT in the last 72 hours. Thyroid Function Tests: Recent Labs    05/27/19 0150  TSH 2.999   Anemia Panel: Recent Labs    05/27/19 0150 05/28/19 0427  FERRITIN 915* 785*   Urine analysis:    Component Value Date/Time   COLORURINE YELLOW 05/28/2016 2056   APPEARANCEUR CLEAR 05/28/2016 2056   LABSPEC 1.041 (H) 05/28/2016 2056   PHURINE 5.0 05/28/2016 2056   GLUCOSEU >1000 (A) 05/28/2016 2056   HGBUR NEGATIVE 05/28/2016 2056   BILIRUBINUR negative 06/19/2018 1601   KETONESUR negative 06/19/2018 1601   KETONESUR 15 (A) 05/28/2016 2056   PROTEINUR negative 06/19/2018 1601   PROTEINUR NEGATIVE 05/28/2016 2056   UROBILINOGEN 0.2 06/19/2018 1601   NITRITE Negative 06/19/2018 1601    NITRITE NEGATIVE 05/28/2016 2056   LEUKOCYTESUR Negative 06/19/2018 1601   Sepsis Labs: (procalcitonin:4,lacticacidven:4)  ) Recent Results (from the past 240 hour(s))  Culture, respiratory (non-expectorated)     Status: None   Collection Time: 05/19/19  9:44 AM  Result Value Ref Range Status   Specimen Description   Final    TRACHEAL ASPIRATE Performed at [facility], [address], [place], [place] [address]    Special Requests   Final    NONE Performed at [facility], [address] [place], [place] [address]    Gram Stain   Final    RARE WBC PRESENT, PREDOMINANTLY PMN RARE GRAM POSITIVE COCCI    Culture   Final    FEW Consistent with normal respiratory flora. Performed at [facility], [address], [place], [place] [address]    Report Status 05/21/2019 FINAL  Final  Culture, blood (Routine X 2) w Reflex to ID Panel     Status: None   Collection Time: 05/19/19  9:44 AM  Result Value Ref Range Status   Specimen Description   Final    RIGHT ANTECUBITAL Performed at [facility], [address], [place], [place] [address]    Special Requests   Final    BOTTLES DRAWN AEROBIC ONLY Blood Culture adequate volume Performed at [facility], [address] [place], [place] [address]    Culture   Final    NO GROWTH 5 DAYS Performed at [facility], [address] [place], [place] [address]    Report Status 05/24/2019 FINAL  Final  Culture, blood (Routine X 2) w Reflex to ID Panel     Status: None   Collection Time: 05/19/19  9:49 AM  Result Value Ref Range Status   Specimen Description   Final    BLOOD RIGHT HAND Performed at [facility], [address], [place], [place] [address]    Special Requests  Final    BOTTLES DRAWN AEROBIC ONLY Blood Culture adequate volume Performed at [facility], [address]  [place], [place] [address]    Culture   Final    NO GROWTH 5 DAYS Performed at [facility], [address] [place], [place] [address]    Report Status 05/24/2019 FINAL  Final  Culture, respiratory (non-expectorated)     Status: None (Preliminary result)   Collection Time: 05/26/19  8:48 AM  Result Value Ref Range Status   Specimen Description   Final    TRACHEAL ASPIRATE Performed at [facility], [address] [place], [place] [address]    Special Requests   Final    NONE Performed at [facility], [address] [place], [place] [address]    Gram Stain   Final    FEW WBC PRESENT, PREDOMINANTLY PMN MODERATE GRAM POSITIVE COCCI MODERATE GRAM NEGATIVE RODS MODERATE GRAM POSITIVE RODS    Culture   Final    CULTURE REINCUBATED FOR BETTER GROWTH Performed at [facility], [address] [place], [place] [address]    Report Status PENDING  Incomplete  Culture, blood (routine x 2)     Status: None (Preliminary result)   Collection Time: 05/26/19  8:49 AM  Result Value Ref Range Status   Specimen Description   Final    RIGHT ANTECUBITAL Performed at [facility], [address] [place], [place] [address]    Special Requests   Final    BOTTLES DRAWN AEROBIC ONLY Blood Culture adequate volume Performed at [facility], [address] [place], [place] [address]    Culture   Final    NO GROWTH < 24 HOURS Performed at [facility], [address] [place], [place] [address]    Report Status PENDING  Incomplete  Culture, blood (routine x 2)     Status: None (Preliminary result)   Collection Time: 05/26/19  8:54 AM  Result Value Ref Range Status   Specimen Description   Final    BLOOD RIGHT HAND Performed at [facility], [address] [place], [place] [address]    Special Requests   Final    BOTTLES DRAWN AEROBIC ONLY Blood Culture adequate volume Performed at  [facility], [address] [place], [place] [address]    Culture   Final    NO GROWTH < 24 HOURS Performed at [facility], [address] [place], [place] [address]    Report Status PENDING  Incomplete         Radiology Studies: Ct Head Wo Contrast  Result Date: 05/26/2019 CLINICAL DATA:  Seizure EXAM: CT HEAD WITHOUT CONTRAST TECHNIQUE: Contiguous axial images were obtained from the base of the skull through the vertex without intravenous contrast. COMPARISON:  05/29/2016 FINDINGS: Brain: No evidence of acute infarction, hemorrhage, hydrocephalus, extra-axial collection or mass lesion/mass effect. Vascular: No hyperdense vessel or unexpected calcification. Skull: Normal. Negative for fracture or focal lesion. Sinuses/Orbits: No acute finding. Other: None. IMPRESSION: No acute intracranial pathology. Electronically Signed   By: Lauralyn PrimesAlex  Bibbey M.D.   On: 05/26/2019 16:25   Ct Angio Chest Pe W Or Wo Contrast  Result Date: 05/27/2019 CLINICAL DATA:  Hypoxia.  Reported COVID-19 pneumonia EXAM: CT ANGIOGRAPHY CHEST WITH CONTRAST TECHNIQUE: Multidetector CT imaging of the chest was performed using the standard protocol during bolus administration of intravenous contrast. Multiplanar CT image reconstructions  and MIPs were obtained to evaluate the vascular anatomy. CONTRAST:  100mL OMNIPAQUE IOHEXOL 350 MG/ML SOLN COMPARISON:  Chest CT May 15, 2019 and chest radiograph May 26, 2019 FINDINGS: Cardiovascular: There is no demonstrable pulmonary embolus. There is no thoracic aortic aneurysm or dissection. The visualized great vessels appear normal. There is no pericardial effusion or pericardial thickening. Central catheter tip is in the superior vena cava. Mediastinum/Nodes: Visualized thyroid appears normal. There are multiple subcentimeter mediastinal lymph nodes. There is a subcarinal lymph node measuring 1.7 x 1.4 cm. No other lymph nodes meet size criteria for pathologic  significance. Nasogastric tube passes through the esophagus with the tip in the stomach. Lungs/Pleura: Endotracheal tube tip is in the distal trachea. No pneumothorax. There is widespread alveolar opacity throughout the lungs diffusely consistent with multifocal pneumonia. There is less consolidation in the upper lobes compared to most recent CT examination. There is consolidation in portions of the lower lobes, more severe on the right than on the left. There is significant increase in consolidation throughout the right lower lobe compared to prior CT and more recent radiograph. There is no appreciable pleural effusion. There are calcifications along each hemidiaphragm consistent with prior asbestos exposure. Upper Abdomen: Visualized upper abdominal structures appear unremarkable. Musculoskeletal: No blastic or lytic bone lesions are evident. There are no chest wall lesions. Review of the MIP images confirms the above findings. IMPRESSION: 1. No demonstrable pulmonary embolus. No thoracic aortic aneurysm or dissection. 2. Widespread multifocal pneumonia. There is less consolidation in the upper lobes compared to most recent CT. There is, however, significant increase in consolidation throughout the right lower lobe consistent with post recent CT and radiographic examination. No pleural effusions evident. 3. Enlarged subcarinal lymph node which may well be of reactive etiology given the extensive parenchymal lung abnormality. Multiple subcentimeter lymph nodes elsewhere noted. 4. Calcification along each hemidiaphragm consistent with previous asbestos exposure. Electronically Signed   By: Bretta BangWilliam  Woodruff III M.D.   On: 05/27/2019 10:56   Dg Abd Portable 1v  Result Date: 05/27/2019 CLINICAL DATA:  Check gastric catheter placement EXAM: PORTABLE ABDOMEN - 1 VIEW COMPARISON:  05/17/2019 FINDINGS: Gastric catheter is noted within the stomach although the proximal side port appears to lie in the distal esophagus.  This could be advanced several cm. No obstructive changes are seen. No abnormal mass or abnormal calcifications are noted. No bony abnormality is seen. IMPRESSION: Gastric catheter tip within the stomach although could be advanced several cm to allow the proximal side port to lie within the stomach. Electronically Signed   By: Alcide CleverMark  Lukens M.D.   On: 05/27/2019 15:05        Scheduled Meds:  chlorhexidine  15 mL Mouth/Throat BID   Chlorhexidine Gluconate Cloth  6 each Topical q morning - 10a   clonazepam  2 mg Oral BID   enoxaparin (LOVENOX) injection  50 mg Subcutaneous Q12H   famotidine  20 mg Oral BID   feeding supplement (GLUCERNA 1.2 CAL)  1,000 mL Per Tube Q24H   feeding supplement (PRO-STAT SUGAR FREE 64)  60 mL Per Tube TID   free water  100 mL Per Tube Q4H   guaiFENesin-dextromethorphan  15 mL Oral Q6H   insulin aspart  0-20 Units Subcutaneous Q4H   insulin glargine  15 Units Subcutaneous BID   mouth rinse  15 mL Mouth Rinse 10 times per day   multivitamin  15 mL Oral Daily   nutrition supplement (JUVEN)  1 packet Per Tube BID  BM   QUEtiapine  100 mg Oral QHS   sodium chloride flush  10-40 mL Intracatheter Q12H   Continuous Infusions:  sodium chloride Stopped (05/28/19 0854)   ceFEPime (MAXIPIME) IV 200 mL/hr at 05/28/19 0900   HYDROmorphone 0.5 mg/hr (05/28/19 0900)   phenylephrine (NEO-SYNEPHRINE) Adult infusion Stopped (05/27/19 1443)   vancomycin 1,250 mg (05/28/19 0943)     LOS: 14 days    Time spent: 35 minutes    During this encounter: Patient Isolation: Airborne, contact, droplet HCP PPE: Face shield, head covering, N95, gown, gloves, and shoe covers    Edwin Dada, MD [facility] 05/28/2019, 10:37 AM     Please page through [place]:  [email or web address] Password [email or web address] If 7PM-7AM, please contact night-coverage

## 2019-05-28 NOTE — Progress Notes (Signed)
Granddaughter Hassan Rowan called unit. Updated her on pt's condition.

## 2019-05-28 NOTE — Progress Notes (Signed)
Spoke with patient's granddaughter, Hassan Rowan and provided full update. Patient's granddaughter has no additional questions at this time.

## 2019-05-28 NOTE — Procedures (Signed)
Extubation Procedure Note  Patient Details:   Name: Lance Mason DOB: [birth year] MRN: [id number]   Airway Documentation:    Vent end date: 05/28/19 Vent end time: 1410   Evaluation  O2 sats: stable throughout Complications: No apparent complications Patient did tolerate procedure well. Bilateral Breath Sounds: Diminished   Yes   Extubated per MD order.  Placed on HFNC 40l @100 %.  Patient tolerated well; no stridor noted.  Patient able to vocalize.  Will continue to monitor.  Phillis Knack [facility] 05/28/2019, 2:27 PM

## 2019-05-28 NOTE — Progress Notes (Signed)
NAME:  Lance Mason, MRN:  [id number], DOB:  December 30, 1955, LOS: 14 ADMISSION DATE:  05-03-19, CONSULTATION DATE: May 15, 2019 REFERRING MD: Dr. Kerry HoughMemon, CHIEF COMPLAINT: Dyspnea  Brief History   63 year old male with diabetes type 2, hypertension admitted on May 26 with acute respiratory failure with hypoxemia due to COVID-19 pneumonia.  Past Medical History  Hypertension Hyperlipidemia DM2  Significant Hospital Events   May 26 admission May 29 intubation, mechanical ventilation in setting of profound encephalopathy May 31 worsening dyssynchrony, hypoxemia, added vecuronium June 1 chemical paralysis used overnight  June 2-5 weaning FiO2/PEEP June 6 weaning well on pressure support June 7 worsening oxygenation/tachycardia fever, started on antibiotics again June 9 following commands, weaning  Consults:  Pulmonary and critical care medicine  Procedures:  Endotracheal tube May 29 > L subclavian CVL May 29 >   Significant Diagnostic Tests:  CT angiogram chest May 27 diffuse bilateral patchy airspace disease groundglass upper lobes predominant, no pulmonary embolism June 1 bilateral lower ext doppler > neg DVT June 1 TTE >>>  Micro Data:  May 21 SARS-CoV-2 positive May 26 blood cultures June 7 resp culture > GPC, GNR, GPR June 7 blood culture >   Antimicrobials/COVID treatment  May 26 azithromycin > June 2 May 27 cefepime > June 2 May 27 vancomycin > May 31 June 2 Fluconazole >  May 27 remdesivir May 27 Tocilizumab x1 5/27 May 29 Convalescent plasma   June 7 Vanc > 6/9 June 7 Cefepime > 6/9  Interim history/subjective:   Tachycardia resolved overnight, resumed again in the morning when we started weaning sedation Passing spontaneous breathing trial this morning Able to follow commands today  Objective   Blood pressure 93/63, pulse (!) 141, temperature 99.8 F (37.7 C), temperature source Axillary, resp. rate (!) 24, height 5\' 5"  (1.651 m), weight  105.6 kg, SpO2 96 %.    Vent Mode: PRVC FiO2 (%):  [50 %-60 %] 50 % Set Rate:  [24 bmp] 24 bmp Vt Set:  [490 mL] 490 mL PEEP:  [8 cmH20] 8 cmH20 Plateau Pressure:  [17 cmH20-30 cmH20] 24 cmH20   Intake/Output Summary (Last 24 hours) at 05/28/2019 0804 Last data filed at 05/28/2019 0700 Gross per 24 hour  Intake 2639.44 ml  Output 2215 ml  Net 424.44 ml   Filed Weights    Examination:  General:  In bed on vent HENT: NCAT ETT in place PULM: CTA B, vent supported breathing CV: RRR, no mgr GI: BS+, soft, nontender MSK: normal bulk and tone Neuro: drowsy, will wake up and follow commands  June 7 chest x-ray images independently reviewed showing bibasilar opacification, airspace disease   Resolved Hospital Problem list     Assessment & Plan:  ARDS due to COVID pneumonia: Oxygenation worse June 7, concern for healthcare associated pneumonia versus worsening COVID RASS goal 0 to -1, adjust today Change to prn sedation Extubation today if more awake Continue pressure support until he wakes up more Stop continuous infusions Stop clonazepam Ventilator associated pneumonia prevention protocol  Concern for healthcare associated pneumonia on May 26, 2019: no evidence, most likely COVID Stop antibiotics   Need for sedation/ventilator synchrony RA SS goal 0 to -1 Stop clonazepam Stop Seroquel Use as needed Versed and Dilaudid only  Best practice:  Diet: Tube feeding Pain/Anxiety/Delirium protocol (if indicated): yes, as above VAP protocol (if indicated): yes DVT prophylaxis: lovenox bid per COVID protocol GI prophylaxis: famotidine Glucose control: SSI Mobility: bed rest, passive range of motion in BED  reasonable Code Status: full [facility] remain in [facility]  Labs   CBC: Recent Labs  Lab 05/24/19 0130  05/25/19 0100 05/26/19 0120 05/26/19 0857 05/27/19 0150 05/27/19 0918 05/28/19 0357  WBC 13.6*  --  14.4* 25.9*  --  9.8  --  6.7   HGB 13.4   < > 13.8 15.0 13.9 11.8* 11.9* 11.2*  HCT 41.0   < > 43.1 46.1 41.0 37.3* 35.0* 35.8*  MCV 94.0  --  96.0 97.3  --  98.4  --  100.0  PLT 185  --  209 298  --  149*  --  143*   < > = values in this interval not displayed.    Basic Metabolic Panel: Recent Labs  Lab 05/24/19 0130  05/25/19 0100 05/26/19 0120 05/26/19 0857 05/27/19 0150 05/27/19 0918 05/28/19 0427  NA 138   < > 132* 137 137 141 141 140  K 3.7   < > 4.3 4.0 3.7 3.6 3.7 3.7  CL 90*  --  83* 91*  --  101  --  104  CO2 38*  --  36* 30  --  34*  --  30  GLUCOSE 139*  --  256* 342*  --  217*  --  137*  BUN 39*  --  55* 54*  --  43*  --  34*  CREATININE 0.60*  --  0.63 0.77  --  0.52*  --  0.46*  CALCIUM 8.6*  --  7.9* 8.8*  --  7.9*  --  7.8*   < > = values in this interval not displayed.   GFR: Estimated Creatinine Clearance: 105.7 mL/min (A) (by C-G formula based on SCr of 0.46 mg/dL (L)). Recent Labs  Lab 05/25/19 0100 05/26/19 0120 05/26/19 1140 05/26/19 1613 05/27/19 0150 05/27/19 1435 05/28/19 0357  WBC 14.4* 25.9*  --   --  9.8  --  6.7  LATICACIDVEN  --   --  2.7* 3.0*  --  2.4*  --     Liver Function Tests: Recent Labs  Lab 05/24/19 0130 05/25/19 0100 05/26/19 0120 05/27/19 0150 05/28/19 0427  AST 64* 58* 58* 67* 69*  ALT 93* 119* 128* 120* 117*  ALKPHOS 124 123 136* 101 121  BILITOT 0.4 0.7 0.7 0.5 0.3  PROT 5.9* 6.2* 6.4* 4.8* 4.8*  ALBUMIN 2.7* 2.8* 2.9* 2.2* 2.2*   No results for input(s): LIPASE, AMYLASE in the last 168 hours. No results for input(s): AMMONIA in the last 168 hours.  ABG    Component Value Date/Time   PHART 7.421 05/27/2019 0918   PCO2ART 52.0 (H) 05/27/2019 0918   PO2ART 72.0 (L) 05/27/2019 0918   HCO3 33.8 (H) 05/27/2019 0918   TCO2 35 (H) 05/27/2019 0918   ACIDBASEDEF 3.0 (H) 05/18/2019 1601   O2SAT 94.0 05/27/2019 0918     Coagulation Profile: No results for input(s): INR, PROTIME in the last 168 hours.  Cardiac Enzymes: Recent Labs  Lab  05/27/19 2018  TROPONINI <0.03    HbA1C: Hemoglobin A1C  Date/Time Value Ref Range Status  06/19/2018 04:01 PM 9.1 (A) 4.0 - 5.6 % Final  01/02/2017 06:51 PM 12.8  Final   Hgb A1c MFr Bld  Date/Time Value Ref Range Status  05/23/2019 02:20 AM 9.9 (H) 4.8 - 5.6 % Final    Comment:    (NOTE) Pre diabetes:          5.7%-6.4% Diabetes:              >  6.4% Glycemic control for   <7.0% adults with diabetes     CBG: Recent Labs  Lab 05/27/19 1207 05/27/19 1550 05/27/19 1957 05/27/19 2326 05/28/19 0358  GLUCAP 151* 108* 106* 153* 142*       Critical care time: 31 minutes The patient remains critically ill requiring mechanical ventilation, adjustment of continuous sedation medicines and management of multiple databases of information.  Multidisciplinary rounding coordinated by pulmonary critical care service in conjunction with hospitalist team.     Roselie Awkward, MD [facility] Pager: [phone] Cell: [phone] If no response, call [phone]

## 2019-05-29 ENCOUNTER — Inpatient Hospital Stay: Payer: Self-pay

## 2019-05-29 ENCOUNTER — Inpatient Hospital Stay (HOSPITAL_COMMUNITY): Payer: BLUE CROSS/BLUE SHIELD

## 2019-05-29 DIAGNOSIS — G9340 Encephalopathy, unspecified: Secondary | ICD-10-CM

## 2019-05-29 LAB — GLUCOSE, CAPILLARY
Glucose-Capillary: 151 mg/dL — ABNORMAL HIGH (ref 70–99)
Glucose-Capillary: 109 mg/dL — ABNORMAL HIGH (ref 70–99)
Glucose-Capillary: 104 mg/dL — ABNORMAL HIGH (ref 70–99)
Glucose-Capillary: 125 mg/dL — ABNORMAL HIGH (ref 70–99)
Glucose-Capillary: 126 mg/dL — ABNORMAL HIGH (ref 70–99)
Glucose-Capillary: 114 mg/dL — ABNORMAL HIGH (ref 70–99)
Glucose-Capillary: 55 mg/dL — ABNORMAL LOW (ref 70–99)
Glucose-Capillary: 29 mg/dL — CL (ref 70–99)
Glucose-Capillary: 84 mg/dL (ref 70–99)
Glucose-Capillary: 37 mg/dL — CL (ref 70–99)

## 2019-05-29 LAB — COMPREHENSIVE METABOLIC PANEL
ALT: 154 U/L — ABNORMAL HIGH (ref 0–44)
AST: 117 U/L — ABNORMAL HIGH (ref 15–41)
Albumin: 2.6 g/dL — ABNORMAL LOW (ref 3.5–5.0)
Alkaline Phosphatase: 203 U/L — ABNORMAL HIGH (ref 38–126)
Anion gap: 11 (ref 5–15)
BUN: 17 mg/dL (ref 8–23)
CO2: 26 mmol/L (ref 22–32)
Calcium: 8 mg/dL — ABNORMAL LOW (ref 8.9–10.3)
Chloride: 106 mmol/L (ref 98–111)
Creatinine, Ser: 0.43 mg/dL — ABNORMAL LOW (ref 0.61–1.24)
GFR calc Af Amer: >60 mL/min (ref >60)
GFR calc non Af Amer: >60 mL/min (ref >60)
Glucose, Bld: 81 mg/dL (ref 70–99)
Potassium: 3.6 mmol/L (ref 3.5–5.1)
Sodium: 143 mmol/L (ref 135–145)
Total Bilirubin: 0.9 mg/dL (ref 0.3–1.2)
Total Protein: 5.7 g/dL — ABNORMAL LOW (ref 6.5–8.1)

## 2019-05-29 LAB — POCT I-STAT 7, (LYTES, BLD GAS, ICA,H+H)
Acid-Base Excess: 2 mmol/L (ref 0.0–2.0)
Acid-base deficit: 2 mmol/L (ref 0.0–2.0)
Bicarbonate: 24 mmol/L (ref 20.0–28.0)
Bicarbonate: 23.8 mmol/L (ref 20.0–28.0)
Calcium, Ion: 1.16 mmol/L (ref 1.15–1.40)
Calcium, Ion: 1.15 mmol/L (ref 1.15–1.40)
HCT: 37 % — ABNORMAL LOW (ref 39.0–52.0)
HCT: 38 % — ABNORMAL LOW (ref 39.0–52.0)
Hemoglobin: 12.6 g/dL — ABNORMAL LOW (ref 13.0–17.0)
Hemoglobin: 12.9 g/dL — ABNORMAL LOW (ref 13.0–17.0)
O2 Saturation: 81 %
O2 Saturation: 83 %
Patient temperature: 100.6
Patient temperature: 100.3
Potassium: 3.2 mmol/L — ABNORMAL LOW (ref 3.5–5.1)
Potassium: 3.9 mmol/L (ref 3.5–5.1)
Sodium: 143 mmol/L (ref 135–145)
Sodium: 145 mmol/L (ref 135–145)
TCO2: 25 mmol/L (ref 22–32)
TCO2: 25 mmol/L (ref 22–32)
pCO2 arterial: 31.5 mmHg — ABNORMAL LOW (ref 32.0–48.0)
pCO2 arterial: 44.7 mmHg (ref 32.0–48.0)
pH, Arterial: 7.493 — ABNORMAL HIGH (ref 7.350–7.450)
pH, Arterial: 7.339 — ABNORMAL LOW (ref 7.350–7.450)
pO2, Arterial: 43 mmHg — ABNORMAL LOW (ref 83.0–108.0)
pO2, Arterial: 53 mmHg — ABNORMAL LOW (ref 83.0–108.0)

## 2019-05-29 LAB — CBC
HCT: 39.7 % (ref 39.0–52.0)
Hemoglobin: 12.5 g/dL — ABNORMAL LOW (ref 13.0–17.0)
MCH: 31 pg (ref 26.0–34.0)
MCHC: 31.5 g/dL (ref 30.0–36.0)
MCV: 98.5 fL (ref 80.0–100.0)
Platelets: 168 10*3/uL (ref 150–400)
RBC: 4.03 MIL/uL — ABNORMAL LOW (ref 4.22–5.81)
RDW: 14.5 % (ref 11.5–15.5)
WBC: 8.8 10*3/uL (ref 4.0–10.5)
nRBC: 0 % (ref 0.0–0.2)

## 2019-05-29 LAB — D-DIMER, QUANTITATIVE: D-Dimer, Quant: 2.57 ug/mL-FEU — ABNORMAL HIGH (ref 0.00–0.50)

## 2019-05-29 LAB — FERRITIN: Ferritin: 1107 ng/mL — ABNORMAL HIGH (ref 24–336)

## 2019-05-29 LAB — C-REACTIVE PROTEIN: CRP: 2.5 mg/dL — ABNORMAL HIGH (ref <1.0)

## 2019-05-29 MED ORDER — INSULIN ASPART 100 UNIT/ML ~~LOC~~ SOLN
0.0000 [IU] | SUBCUTANEOUS | Status: DC
  Administered 2019-05-30 (×2): 4 [IU] via SUBCUTANEOUS
  Administered 2019-05-31 (×2): 11 [IU] via SUBCUTANEOUS
  Administered 2019-05-31: 7 [IU] via SUBCUTANEOUS
  Administered 2019-05-31: 18:00:00 3 [IU] via SUBCUTANEOUS
  Administered 2019-06-01 – 2019-06-02 (×2): 4 [IU] via SUBCUTANEOUS
  Administered 2019-06-02 (×3): 7 [IU] via SUBCUTANEOUS
  Administered 2019-06-03: 16:00:00 3 [IU] via SUBCUTANEOUS
  Administered 2019-06-03: 01:00:00 7 [IU] via SUBCUTANEOUS
  Administered 2019-06-03: 3 [IU] via SUBCUTANEOUS
  Administered 2019-06-03: 08:00:00 4 [IU] via SUBCUTANEOUS
  Administered 2019-06-03 (×2): 7 [IU] via SUBCUTANEOUS
  Administered 2019-06-04: 11 [IU] via SUBCUTANEOUS
  Administered 2019-06-04: 3 [IU] via SUBCUTANEOUS
  Administered 2019-06-04: 7 [IU] via SUBCUTANEOUS
  Administered 2019-06-04 (×2): 4 [IU] via SUBCUTANEOUS
  Administered 2019-06-04: 08:00:00 3 [IU] via SUBCUTANEOUS
  Administered 2019-06-04: 4 [IU] via SUBCUTANEOUS
  Administered 2019-06-05: 3 [IU] via SUBCUTANEOUS
  Administered 2019-06-05: 21:00:00 7 [IU] via SUBCUTANEOUS
  Administered 2019-06-05: 11 [IU] via SUBCUTANEOUS
  Administered 2019-06-05 (×2): 3 [IU] via SUBCUTANEOUS
  Administered 2019-06-06: 11 [IU] via SUBCUTANEOUS
  Administered 2019-06-06 (×2): 4 [IU] via SUBCUTANEOUS
  Administered 2019-06-06 (×2): 3 [IU] via SUBCUTANEOUS
  Administered 2019-06-06 – 2019-06-07 (×3): 7 [IU] via SUBCUTANEOUS
  Administered 2019-06-07: 4 [IU] via SUBCUTANEOUS
  Administered 2019-06-07: 7 [IU] via SUBCUTANEOUS
  Administered 2019-06-07: 08:00:00 4 [IU] via SUBCUTANEOUS
  Administered 2019-06-07: 3 [IU] via SUBCUTANEOUS
  Administered 2019-06-07 – 2019-06-08 (×5): 4 [IU] via SUBCUTANEOUS
  Administered 2019-06-08: 7 [IU] via SUBCUTANEOUS
  Administered 2019-06-08 – 2019-06-09 (×2): 4 [IU] via SUBCUTANEOUS
  Administered 2019-06-09: 3 [IU] via SUBCUTANEOUS
  Administered 2019-06-09 (×3): 4 [IU] via SUBCUTANEOUS

## 2019-05-29 MED ORDER — CHLORHEXIDINE GLUCONATE 0.12% ORAL RINSE (MEDLINE KIT)
15.0000 mL | Freq: Two times a day (BID) | OROMUCOSAL | Status: DC
  Administered 2019-05-29 – 2019-05-30 (×2): 15 mL via OROMUCOSAL

## 2019-05-29 MED ORDER — HYDROMORPHONE HCL 1 MG/ML IJ SOLN
1.0000 mg | INTRAMUSCULAR | Status: DC | PRN
  Administered 2019-05-29: 3 mg via INTRAVENOUS
  Administered 2019-05-30 – 2019-06-02 (×2): 2 mg via INTRAVENOUS
  Administered 2019-06-02 (×2): 3 mg via INTRAVENOUS
  Administered 2019-06-02: 2 mg via INTRAVENOUS
  Administered 2019-06-02 – 2019-06-03 (×3): 3 mg via INTRAVENOUS
  Administered 2019-06-04: 2 mg via INTRAVENOUS
  Administered 2019-06-06 (×9): 3 mg via INTRAVENOUS
  Administered 2019-06-06: 2 mg via INTRAVENOUS
  Administered 2019-06-07 (×3): 3 mg via INTRAVENOUS
  Administered 2019-06-07: 12:00:00 2 mg via INTRAVENOUS
  Filled 2019-05-29: qty 3

## 2019-05-29 MED ORDER — DEXTROSE 50 % IV SOLN
INTRAVENOUS | Status: AC
  Administered 2019-05-29: 25 g via INTRAVENOUS
  Filled 2019-05-29: qty 50

## 2019-05-29 MED ORDER — ADULT MULTIVITAMIN LIQUID CH
15.0000 mL | Freq: Every day | ORAL | Status: DC
  Administered 2019-05-29 – 2019-06-09 (×10): 15 mL
  Filled 2019-05-29 (×10): qty 15

## 2019-05-29 MED ORDER — CHLORHEXIDINE GLUCONATE CLOTH 2 % EX PADS
6.0000 | MEDICATED_PAD | Freq: Every day | CUTANEOUS | Status: DC
  Administered 2019-05-29 – 2019-06-09 (×13): 6 via TOPICAL

## 2019-05-29 MED ORDER — ORAL CARE MOUTH RINSE
15.0000 mL | OROMUCOSAL | Status: DC
  Administered 2019-05-29 – 2019-06-09 (×110): 15 mL via OROMUCOSAL

## 2019-05-29 MED ORDER — SODIUM CHLORIDE 0.9% FLUSH: 10.0000 mL | INTRAVENOUS | Status: DC | PRN

## 2019-05-29 MED ORDER — SODIUM CHLORIDE 0.9 % IV BOLUS
500.0000 mL | Freq: Once | INTRAVENOUS | Status: AC
  Administered 2019-05-29: 500 mL via INTRAVENOUS

## 2019-05-29 MED ORDER — QUETIAPINE FUMARATE 50 MG PO TABS
100.0000 mg | ORAL_TABLET | Freq: Every day | ORAL | Status: DC
  Administered 2019-05-29 – 2019-05-30 (×2): 100 mg
  Filled 2019-05-29 (×3): qty 2

## 2019-05-29 MED ORDER — SODIUM CHLORIDE 0.9 % IV SOLN
1.0000 mg/h | INTRAVENOUS | Status: DC
  Administered 2019-05-29: 2 mg/h via INTRAVENOUS
  Administered 2019-05-30 – 2019-06-07 (×19): 5 mg/h via INTRAVENOUS
  Administered 2019-06-08: 7 mg/h via INTRAVENOUS
  Administered 2019-06-08 (×2): 5 mg/h via INTRAVENOUS
  Administered 2019-06-09 (×3): 7 mg/h via INTRAVENOUS
  Filled 2019-05-29 (×34): qty 5

## 2019-05-29 MED ORDER — GUAIFENESIN-DM 100-10 MG/5ML PO SYRP
15.0000 mL | ORAL_SOLUTION | Freq: Four times a day (QID) | ORAL | Status: DC
  Administered 2019-05-29 – 2019-06-09 (×39): 15 mL
  Filled 2019-05-29 (×41): qty 15

## 2019-05-29 MED ORDER — ETOMIDATE 2 MG/ML IV SOLN
20.0000 mg | Freq: Once | INTRAVENOUS | Status: AC
  Administered 2019-05-29: 20 mg via INTRAVENOUS

## 2019-05-29 MED ORDER — DEXMEDETOMIDINE HCL IN NACL 400 MCG/100ML IV SOLN
0.0000 ug/kg/h | INTRAVENOUS | Status: DC
  Administered 2019-05-29: 15:00:00 1 ug/kg/h via INTRAVENOUS
  Administered 2019-05-29: 09:00:00 1.2 ug/kg/h via INTRAVENOUS
  Administered 2019-05-29: 22:00:00 1.5 ug/kg/h via INTRAVENOUS
  Administered 2019-05-29: 11:00:00 1.2 ug/kg/h via INTRAVENOUS
  Administered 2019-05-29: 19:00:00 1 ug/kg/h via INTRAVENOUS
  Filled 2019-05-29 (×5): qty 100

## 2019-05-29 MED ORDER — HYDROMORPHONE HCL 1 MG/ML IJ SOLN
1.0000 mg | INTRAMUSCULAR | Status: DC | PRN
  Administered 2019-05-29 – 2019-06-01 (×2): 1 mg via INTRAVENOUS
  Filled 2019-05-29: qty 1

## 2019-05-29 MED ORDER — FENTANYL CITRATE (PF) 100 MCG/2ML IJ SOLN
INTRAMUSCULAR | Status: AC
  Administered 2019-05-29: 100 ug via INTRAVENOUS
  Filled 2019-05-29: qty 2

## 2019-05-29 MED ORDER — ROCURONIUM BROMIDE 10 MG/ML (PF) SYRINGE
PREFILLED_SYRINGE | INTRAVENOUS | Status: AC
  Administered 2019-05-29: 80 mg
  Filled 2019-05-29: qty 10

## 2019-05-29 MED ORDER — MIDAZOLAM HCL 2 MG/2ML IJ SOLN
2.0000 mg | INTRAMUSCULAR | Status: DC | PRN
  Administered 2019-05-29 – 2019-06-08 (×14): 2 mg via INTRAVENOUS
  Filled 2019-05-29 (×2): qty 2

## 2019-05-29 MED ORDER — PHENYLEPHRINE HCL-NACL 10-0.9 MG/250ML-% IV SOLN
0.0000 ug/min | INTRAVENOUS | Status: DC
  Administered 2019-05-29: 80 ug/min via INTRAVENOUS
  Administered 2019-05-29: 75 ug/min via INTRAVENOUS
  Administered 2019-05-29 (×2): 100 ug/min via INTRAVENOUS
  Administered 2019-05-29: 50 ug/min via INTRAVENOUS
  Administered 2019-05-29: 100 ug/min via INTRAVENOUS
  Administered 2019-05-29: 90 ug/min via INTRAVENOUS
  Administered 2019-05-29: 200 ug/min via INTRAVENOUS
  Administered 2019-05-29: 23:00:00 300 ug/min via INTRAVENOUS
  Administered 2019-05-29: 100 ug/min via INTRAVENOUS
  Administered 2019-05-30: 90 ug/min via INTRAVENOUS
  Administered 2019-05-30: 200 ug/min via INTRAVENOUS
  Administered 2019-05-30: 45 ug/min via INTRAVENOUS
  Administered 2019-05-30: 190 ug/min via INTRAVENOUS
  Administered 2019-05-30 (×2): 200 ug/min via INTRAVENOUS
  Administered 2019-05-31 (×4): 400 ug/min via INTRAVENOUS
  Filled 2019-05-29 (×2): qty 500
  Filled 2019-05-29 (×16): qty 250

## 2019-05-29 MED ORDER — SODIUM CHLORIDE 0.9 % IV SOLN
2.0000 g | Freq: Three times a day (TID) | INTRAVENOUS | Status: DC
  Administered 2019-05-29 – 2019-05-31 (×6): 2 g via INTRAVENOUS
  Filled 2019-05-29 (×6): qty 2

## 2019-05-29 MED ORDER — FAMOTIDINE 40 MG/5ML PO SUSR
20.0000 mg | Freq: Two times a day (BID) | ORAL | Status: DC
  Administered 2019-05-29 – 2019-06-09 (×21): 20 mg
  Filled 2019-05-29 (×22): qty 2.5

## 2019-05-29 MED ORDER — DEXTROSE 50 % IV SOLN
25.0000 g | INTRAVENOUS | Status: AC
  Administered 2019-05-29: 25 g via INTRAVENOUS

## 2019-05-29 MED ORDER — MIDAZOLAM HCL 2 MG/2ML IJ SOLN
2.0000 mg | INTRAMUSCULAR | Status: AC | PRN
  Administered 2019-05-29 – 2019-05-30 (×3): 2 mg via INTRAVENOUS
  Filled 2019-05-29 (×3): qty 2

## 2019-05-29 MED ORDER — FENTANYL CITRATE (PF) 100 MCG/2ML IJ SOLN
100.0000 ug | Freq: Once | INTRAMUSCULAR | Status: AC
  Administered 2019-05-29: 100 ug via INTRAVENOUS

## 2019-05-29 MED ORDER — DEXTROSE 50 % IV SOLN
INTRAVENOUS | Status: AC
  Administered 2019-05-29: 30 mL
  Filled 2019-05-29: qty 50

## 2019-05-29 MED ORDER — INSULIN ASPART 100 UNIT/ML ~~LOC~~ SOLN: 0.0000 [IU] | Freq: Three times a day (TID) | SUBCUTANEOUS | Status: DC

## 2019-05-29 MED ORDER — INSULIN GLARGINE 100 UNIT/ML ~~LOC~~ SOLN
10.0000 [IU] | Freq: Two times a day (BID) | SUBCUTANEOUS | Status: DC
  Filled 2019-05-29 (×2): qty 0.1

## 2019-05-29 MED ORDER — SODIUM CHLORIDE 0.9% FLUSH
10.0000 mL | Freq: Two times a day (BID) | INTRAVENOUS | Status: DC
  Administered 2019-05-29 (×2): 10 mL
  Administered 2019-05-30: 20 mL
  Administered 2019-05-30 – 2019-06-08 (×11): 10 mL
  Administered 2019-06-09: 20 mL

## 2019-05-29 MED ORDER — ROCURONIUM BROMIDE 50 MG/5ML IV SOLN
100.0000 mg | Freq: Once | INTRAVENOUS | Status: AC
  Administered 2019-05-29: 80 mg via INTRAVENOUS

## 2019-05-29 NOTE — Progress Notes (Signed)
Patient with 2 episodes of bradycardia and hypotension. Dr. Oletta Darter notified. D/C precedex infusion. Increase dilaudid infusion as needed and use PRN versed. Will continue to closely monitor patient.

## 2019-05-29 NOTE — Progress Notes (Signed)
[facility] called regarding double stacking on ventilator. Dr. Oletta Darter advised to increase sedation. Will continue to closely monitor.

## 2019-05-29 NOTE — Progress Notes (Signed)
PICC consent obtained via [place] interpreter to Lance Mason ( Patient Daughter); Tel # [phone], procedure explained to daughter and questions answered. PICC Team will proceed with PICC placement. Interpreter information is Mickel Baas and her ID # [id number].

## 2019-05-29 NOTE — Progress Notes (Signed)
[facility] Physician-Brief Progress Note Patient Name: Lance Mason DOB: [birth year] MRN: [id number]   Date of Service  05/29/2019  HPI/Events of Note  Bradycardia - Patient has had a second episode of bradycardia on Precedex and Dilaudid.   eICU Interventions  Will order: 1. D/C Precedex IV infusion.  2. Sedate patient with Dilaudid and Versed PRN which are already ordered.      Intervention Category Major Interventions: Arrhythmia - evaluation and management;Hypotension - evaluation and management  Sommer,Steven Cornelia Copa 05/29/2019, 11:11 PM

## 2019-05-29 NOTE — Progress Notes (Addendum)
PT Cancellation/Discharge Note  Patient Details Name: Waris Rodger MRN: [id number] DOB: [birth year]   Cancelled Treatment:    Reason Eval/Treat Not Completed: Patient at procedure or test/unavailable  Patient getting a PICC line. Will check back.  Addendum @ 15:30--spoke with RN and pt requiring 80% FiO2 and now on pressors. Will sign-off and please re-order when therapy appropriate. Thank you  Rexanne Mano, PT 05/29/2019, 1:37 PM

## 2019-05-29 NOTE — Progress Notes (Signed)
Hassan Rowan granddaughter called and updated about patient this evening. She was updated on current plan of care, VS and all questions were answered. Granddaughter grateful for the care and updates.

## 2019-05-29 NOTE — Progress Notes (Signed)
PROGRESS NOTE  Lance Mason  QMV:[id number] DOB: Jan 09, 1956 DOA: 05/11/2019 PCP: Patient, No Pcp Per   Brief Narrative: Lance Mason is a 63 y.o. male with a history of T2DM, HTN, obesity who presented with cough and shortness of breath and fever found to be hypoxic requiring NRB. CXR demonstrated bilateral opacities and he was admitted to [facility] for covid-19 pneumonia, ultimately intubated.  Assessment & Plan: Principal Problem:   Acute respiratory failure with hypoxemia (HCC) Active Problems:   Type 2 diabetes mellitus without complication, without long-term current use of insulin (HCC)   Pure hypercholesterolemia   Pneumonia due to COVID-19 virus   CAP (community acquired pneumonia)   Abnormal liver function   Pressure injury of skin  Acute hypoxic respiratory failure due to covid-19 pneumonia: Reintubated this AM. - s/p actemra 5/27, convalescent plasma 5/29 - s/p remdesivir 5/27 - 5/31.  - Continue airborne, contact precautions. PPE including surgical gown, gloves, face shield, cap, shoe covers, and N-95 used during this encounter in a negative pressure room.  - Check daily labs: CBC w/diff, CMP, d-dimer, fibrinogen, ferritin, LDH, CRP - Avoid NSAIDs - s/p actemra 5/27, convalescent plasma 5/29, remdesivir 5/27 - 5/31. - s/p abx x7 days. Add back with suspected aspiration  Oral candidiasis: - Has been given fluconazole x7 days.  T2DM: HbA1c 9.9%.  - Hold home metformin, glipizide - Continue lantus, SSI.  Hypotension: BPs trending down, worse with sedation.  - Neo gtt - DC old subclavian line, insert PICC  Obesity: BMI: 38. Contributes to poor prognosis - Optimize nutritional status per dietitian  Hypernatremia: Resolved.  Seizure-like activity Nursing noted a few minutes seizure like activity (clonic jerking of both arms, left leg) that were self-limited in a.m. 6/7, in context of tachycardia.  Patient subsequently agitated and moving all extremities.   No focal weakness.   Also, no further posturing, no further seizure like activity today.  Doubt status. CT head 6/7 unremarkable.  No further jerking.  Suspect this was agitation, not seizure.  DVT prophylaxis: Lovenox Code Status: Full Family Communication: None at bedside today. Will update family. Disposition Plan: Remain in ICU  Consultants:   PCCM  Procedures:   5/29 ETT  5/29 - 6/10 L subclavian central line  6/10 PICC  Antimicrobials:  Azithromycin 5/26 >> 6/2  Cefepime 5/27 >> 6/2 and 6/7 >>  Fluconazole 6/2 >> 6/9  Remdesivir 5/27 >> 5/31  Vancomycin 5/26 >> 5/28  And 6/7 >>  Subjective: Required reintubation this AM. Thick secretions since vomiting episode earlier today.   Objective: Vitals:   05/29/19 0422 05/29/19 0500 05/29/19 0600 05/29/19 0814  BP: (!) 161/89 (!) 158/81 (!) 150/92 (!) 182/90  Pulse: (!) 124 91 (!) 126 (!) 123  Resp: (!) 36 (!) 37 (!) 31 (!) 24  Temp:      TempSrc:      SpO2: 95% 94% 94% 99%  Height:        Intake/Output Summary (Last 24 hours) at 05/29/2019 0940 Last data filed at 05/29/2019 0400 Gross per 24 hour  Intake 1056.06 ml  Output 1535 ml  Net -478.94 ml   Filed Weights    Gen: 63 y.o. male in no distress Pulm: Clear, even vent-assisted breaths. Rhonchi.  CV: Regular rate and rhythm. No murmur, rub, or gallop. No JVD, trace pedal edema. GI: Abdomen soft, non-tender, non-distended, with normoactive bowel sounds. No organomegaly or masses felt. Ext: Warm, no deformities Skin: No rashes, lesions or ulcers Neuro: Sedated Psych: UTD.  Data Reviewed: I have personally reviewed following labs and imaging studies  CBC: Recent Labs  Lab 05/25/19 0100 05/26/19 0120  05/27/19 0150 05/27/19 0918 05/28/19 0357 05/29/19 0215 05/29/19 0743  WBC 14.4* 25.9*  --  9.8  --  6.7 8.8  --   HGB 13.8 15.0   < > 11.8* 11.9* 11.2* 12.5* 12.6*  HCT 43.1 46.1   < > 37.3* 35.0* 35.8* 39.7 37.0*  MCV 96.0 97.3  --  98.4  --   100.0 98.5  --   PLT 209 298  --  149*  --  143* 168  --    < > = values in this interval not displayed.   Basic Metabolic Panel: Recent Labs  Lab 05/25/19 0100 05/26/19 0120  05/27/19 0150 05/27/19 0918 05/28/19 0427 05/29/19 0215 05/29/19 0743  NA 132* 137   < > 141 141 140 143 143  K 4.3 4.0   < > 3.6 3.7 3.7 3.6 3.2*  CL 83* 91*  --  101  --  104 106  --   CO2 36* 30  --  34*  --  30 26  --   GLUCOSE 256* 342*  --  217*  --  137* 81  --   BUN 55* 54*  --  43*  --  34* 17  --   CREATININE 0.63 0.77  --  0.52*  --  0.46* 0.43*  --   CALCIUM 7.9* 8.8*  --  7.9*  --  7.8* 8.0*  --    < > = values in this interval not displayed.   GFR: Estimated Creatinine Clearance: 105.7 mL/min (A) (by C-G formula based on SCr of 0.43 mg/dL (L)). Liver Function Tests: Recent Labs  Lab 05/25/19 0100 05/26/19 0120 05/27/19 0150 05/28/19 0427 05/29/19 0215  AST 58* 58* 67* 69* 117*  ALT 119* 128* 120* 117* 154*  ALKPHOS 123 136* 101 121 203*  BILITOT 0.7 0.7 0.5 0.3 0.9  PROT 6.2* 6.4* 4.8* 4.8* 5.7*  ALBUMIN 2.8* 2.9* 2.2* 2.2* 2.6*   No results for input(s): LIPASE, AMYLASE in the last 168 hours. No results for input(s): AMMONIA in the last 168 hours. Coagulation Profile: No results for input(s): INR, PROTIME in the last 168 hours. Cardiac Enzymes: Recent Labs  Lab 05/27/19 2018  TROPONINI <0.03   BNP (last 3 results) No results for input(s): PROBNP in the last 8760 hours. HbA1C: No results for input(s): HGBA1C in the last 72 hours. CBG: Recent Labs  Lab 05/28/19 2009 05/28/19 2318 05/29/19 0215 05/29/19 0344 05/29/19 0740  GLUCAP 75 99 109* 104* 125*   Lipid Profile: No results for input(s): CHOL, HDL, LDLCALC, TRIG, CHOLHDL, LDLDIRECT in the last 72 hours. Thyroid Function Tests: Recent Labs    05/27/19 0150  TSH 2.999   Anemia Panel: Recent Labs    05/28/19 0427 05/29/19 0210  FERRITIN 785* 1,107*   Urine analysis:    Component Value Date/Time    COLORURINE YELLOW 05/28/2016 2056   APPEARANCEUR CLEAR 05/28/2016 2056   LABSPEC 1.041 (H) 05/28/2016 2056   PHURINE 5.0 05/28/2016 2056   GLUCOSEU >1000 (A) 05/28/2016 2056   HGBUR NEGATIVE 05/28/2016 2056   BILIRUBINUR negative 06/19/2018 1601   KETONESUR negative 06/19/2018 1601   KETONESUR 15 (A) 05/28/2016 2056   PROTEINUR negative 06/19/2018 1601   PROTEINUR NEGATIVE 05/28/2016 2056   UROBILINOGEN 0.2 06/19/2018 1601   NITRITE Negative 06/19/2018 1601   NITRITE NEGATIVE 05/28/2016 2056   LEUKOCYTESUR Negative  06/19/2018 1601   Recent Results (from the past 240 hour(s))  Culture, respiratory (non-expectorated)     Status: None   Collection Time: 05/19/19  9:44 AM  Result Value Ref Range Status   Specimen Description   Final    TRACHEAL ASPIRATE Performed at [facility], [address], [place], [place] [address]    Special Requests   Final    NONE Performed at [facility], [address] [place], [place] [address]    Gram Stain   Final    RARE WBC PRESENT, PREDOMINANTLY PMN RARE GRAM POSITIVE COCCI    Culture   Final    FEW Consistent with normal respiratory flora. Performed at [facility], [address] [place], [place] [address]    Report Status 05/21/2019 FINAL  Final  Culture, blood (Routine X 2) w Reflex to ID Panel     Status: None   Collection Time: 05/19/19  9:44 AM  Result Value Ref Range Status   Specimen Description   Final    RIGHT ANTECUBITAL Performed at [facility], [address] [place], [place] [address]    Special Requests   Final    BOTTLES DRAWN AEROBIC ONLY Blood Culture adequate volume Performed at [facility], [address] [place], [place] [address]    Culture   Final    NO GROWTH 5 DAYS Performed at [facility], [address], [place], [place] [address]    Report Status 05/24/2019 FINAL  Final  Culture, blood (Routine X 2) w Reflex to  ID Panel     Status: None   Collection Time: 05/19/19  9:49 AM  Result Value Ref Range Status   Specimen Description   Final    BLOOD RIGHT HAND Performed at [facility], [address], [place], [place] [address]    Special Requests   Final    BOTTLES DRAWN AEROBIC ONLY Blood Culture adequate volume Performed at [facility], [address] [place], [place] [address]    Culture   Final    NO GROWTH 5 DAYS Performed at [facility], [address], [place], [place] [address]    Report Status 05/24/2019 FINAL  Final  Culture, respiratory (non-expectorated)     Status: None   Collection Time: 05/26/19  8:48 AM  Result Value Ref Range Status   Specimen Description   Final    TRACHEAL ASPIRATE Performed at [facility], [address], [place], [place] [address]    Special Requests   Final    NONE Performed at [facility], [address] [place], [place] [address]    Gram Stain   Final    FEW WBC PRESENT, PREDOMINANTLY PMN MODERATE GRAM POSITIVE COCCI MODERATE GRAM NEGATIVE RODS MODERATE GRAM POSITIVE RODS    Culture   Final    MODERATE Consistent with normal respiratory flora. Performed at [facility], [address], [place], [place] [address]    Report Status 05/28/2019 FINAL  Final  Culture, blood (routine x 2)     Status: None (Preliminary result)   Collection Time: 05/26/19  8:49 AM  Result Value Ref Range Status   Specimen Description   Final    RIGHT ANTECUBITAL Performed at [facility], [address], [place], [place] [address]    Special Requests   Final    BOTTLES DRAWN AEROBIC  ONLY Blood Culture adequate volume Performed at [facility], [address] [place], [place] [address]    Culture   Final    NO GROWTH 2 DAYS Performed at [facility], [address] [place], [place] [address]    Report Status PENDING   Incomplete  Culture, blood (routine x 2)     Status: None (Preliminary result)   Collection Time: 05/26/19  8:54 AM  Result Value Ref Range Status   Specimen Description   Final    BLOOD RIGHT HAND Performed at [facility], [address] [place], [place] [address]    Special Requests   Final    BOTTLES DRAWN AEROBIC ONLY Blood Culture adequate volume Performed at [facility], [address] [place], [place] [address]    Culture   Final    NO GROWTH 2 DAYS Performed at [facility], [address] [place] [address] [place], [place] [address]    Report Status PENDING  Incomplete      Radiology Studies: Ct Angio Chest Pe W Or Wo Contrast  Result Date: 05/27/2019 CLINICAL DATA:  Hypoxia.  Reported COVID-19 pneumonia EXAM: CT ANGIOGRAPHY CHEST WITH CONTRAST TECHNIQUE: Multidetector CT imaging of the chest was performed using the standard protocol during bolus administration of intravenous contrast. Multiplanar CT image reconstructions and MIPs were obtained to evaluate the vascular anatomy. CONTRAST:  100mL OMNIPAQUE IOHEXOL 350 MG/ML SOLN COMPARISON:  Chest CT May 15, 2019 and chest radiograph May 26, 2019 FINDINGS: Cardiovascular: There is no demonstrable pulmonary embolus. There is no thoracic aortic aneurysm or dissection. The visualized great vessels appear normal. There is no pericardial effusion or pericardial thickening. Central catheter tip is in the superior vena cava. Mediastinum/Nodes: Visualized thyroid appears normal. There are multiple subcentimeter mediastinal lymph nodes. There is a subcarinal lymph node measuring 1.7 x 1.4 cm. No other lymph nodes meet size criteria for pathologic significance. Nasogastric tube passes through the esophagus with the tip in the stomach. Lungs/Pleura: Endotracheal tube tip is in the distal trachea. No pneumothorax. There is widespread alveolar opacity throughout the lungs diffusely consistent with multifocal pneumonia.  There is less consolidation in the upper lobes compared to most recent CT examination. There is consolidation in portions of the lower lobes, more severe on the right than on the left. There is significant increase in consolidation throughout the right lower lobe compared to prior CT and more recent radiograph. There is no appreciable pleural effusion. There are calcifications along each hemidiaphragm consistent with prior asbestos exposure. Upper Abdomen: Visualized upper abdominal structures appear unremarkable. Musculoskeletal: No blastic or lytic bone lesions are evident. There are no chest wall lesions. Review of the MIP images confirms the above findings. IMPRESSION: 1. No demonstrable pulmonary embolus. No thoracic aortic aneurysm or dissection. 2. Widespread multifocal pneumonia. There is less consolidation in the upper lobes compared to most recent CT. There is, however, significant increase in consolidation throughout the right lower lobe consistent with post recent CT and radiographic examination. No pleural effusions evident. 3. Enlarged subcarinal lymph node which may well be of reactive etiology given the extensive parenchymal lung abnormality. Multiple subcentimeter lymph nodes elsewhere noted. 4. Calcification along each hemidiaphragm consistent with previous asbestos exposure. Electronically Signed   By: Bretta BangWilliam  Woodruff III M.D.   On: 05/27/2019 10:56   Portable Chest X-ray  Result Date: 05/29/2019 CLINICAL DATA:  Shortness of breath EXAM: PORTABLE CHEST 1 VIEW COMPARISON:  05/26/2019 FINDINGS: Nasogastric tube coiled in  the stomach. Left subclavian central venous catheter with the tip projecting over the SVC. Endotracheal tube with the tip 3 cm above the carina. Bilateral diffuse interstitial and alveolar airspace opacities. No pleural effusion. Bilateral pleural plaques along the diaphragmatic surfaces. No pneumothorax. Stable cardiomegaly. No acute osseous abnormality. IMPRESSION: 1.  Support lines and tubing in satisfactory position. 2. Worsening bilateral alveolar and interstitial airspace disease concerning for worsening pulmonary edema versus multilobar pneumonia. Electronically Signed   By: Elige KoHetal  Patel   On: 05/29/2019 09:32   Dg Abd Portable 1v  Result Date: 05/27/2019 CLINICAL DATA:  Check gastric catheter placement EXAM: PORTABLE ABDOMEN - 1 VIEW COMPARISON:  05/17/2019 FINDINGS: Gastric catheter is noted within the stomach although the proximal side port appears to lie in the distal esophagus. This could be advanced several cm. No obstructive changes are seen. No abnormal mass or abnormal calcifications are noted. No bony abnormality is seen. IMPRESSION: Gastric catheter tip within the stomach although could be advanced several cm to allow the proximal side port to lie within the stomach. Electronically Signed   By: Alcide CleverMark  Lukens M.D.   On: 05/27/2019 15:05   [place] Ekg Site Rite  Result Date: 05/29/2019 If Site Rite image not attached, placement could not be confirmed due to current cardiac rhythm.   Scheduled Meds: . chlorhexidine  15 mL Mouth/Throat BID  . enoxaparin (LOVENOX) injection  50 mg Subcutaneous Q12H  . famotidine  20 mg Per Tube BID  . guaiFENesin-dextromethorphan  15 mL Per Tube Q6H  . insulin aspart  0-20 Units Subcutaneous Q4H  . insulin glargine  15 Units Subcutaneous BID  . mouth rinse  15 mL Mouth Rinse BID  . multivitamin  15 mL Per Tube Daily  . nutrition supplement (JUVEN)  1 packet Per Tube BID BM  . QUEtiapine  100 mg Per Tube QHS  . sodium chloride flush  10-40 mL Intracatheter Q12H   Continuous Infusions: . sodium chloride 10 mL/hr at 05/29/19 0200  . acetaminophen Stopped (05/28/19 1745)  . ceFEPime (MAXIPIME) IV    . dexmedetomidine (PRECEDEX) IV infusion 1.2 mcg/kg/hr (05/29/19 0905)  . phenylephrine (NEO-SYNEPHRINE) Adult infusion       LOS: 15 days   Time spent: 35 minutes.  Tyrone Nineyan B Honi Name, MD [facility]  [email or web address] Password [email or web address] 05/29/2019, 9:40 AM

## 2019-05-29 NOTE — Progress Notes (Signed)
Patient intubated per MD with 7.5 ETT secured at 25 at the lip.  Placed on vent settings noted in the flowsheet.  Patient tolerated well.  Will continue to monitor.

## 2019-05-29 NOTE — Progress Notes (Signed)
Spoke with Granddaughter Hassan Rowan. Gave update that pt is re-intubated. Will continue to update

## 2019-05-29 NOTE — Progress Notes (Addendum)
Hassan Rowan, pt's granddaughter called and was updated.

## 2019-05-29 NOTE — Procedures (Signed)
Intubation Procedure Note Lance Mason [id number] 02/04/1956  Procedure: Intubation Indications: Airway protection and maintenance  Procedure Details Consent: Unable to obtain consent because of emergent medical necessity. Time Out: Verified patient identification, verified procedure, site/side was marked, verified correct patient position, special equipment/implants available, medications/allergies/relevent history reviewed, required imaging and test results available.  Performed  Drugs 75m IV versed, 541m IV Fentanyl, Etomidate 208mV, Rocuronium 52m64m DL x 1 with MAC 4 blade Grade 3 view 7.5 ET tube passed through cords under direct visualization Placement confirmed with bilateral breath sounds, positive EtCO2 change and smoke in tube   Evaluation Hemodynamic Status: BP stable throughout; O2 sats: stable throughout and transiently fell during during procedure Patient's Current Condition: stable Complications: No apparent complications Patient did tolerate procedure well. Chest X-ray ordered to verify placement.  CXR: pending.   DougSimonne Maffucci0/2020

## 2019-05-29 NOTE — Progress Notes (Signed)
CBG 29. Amp of D50 given. Maryland Pink, MD notified. Hold tonights dose of lantus and continue to closely monitor CBGs.

## 2019-05-29 NOTE — Progress Notes (Signed)
Peripherally Inserted Central Catheter/Midline Placement  The IV Nurse has discussed with the patient and/or persons authorized to consent for the patient, the purpose of this procedure and the potential benefits and risks involved with this procedure.  The benefits include less needle sticks, lab draws from the catheter, and the patient may be discharged home with the catheter. Risks include, but not limited to, infection, bleeding, blood clot (thrombus formation), and puncture of an artery; nerve damage and irregular heartbeat and possibility to perform a PICC exchange if needed/ordered by physician.  Alternatives to this procedure were also discussed.  Bard Power PICC patient education guide, fact sheet on infection prevention and patient information card has been provided to patient /or left at bedside.    PICC/Midline Placement Documentation  PICC Triple Lumen 38/45/36 PICC Right Basilic 45 cm 1 cm (Active)  Indication for Insertion or Continuance of Line Vasoactive infusions 05/29/2019  2:06 PM  Exposed Catheter (cm) 0 cm 05/29/2019  2:06 PM  Site Assessment Clean;Dry;Intact 05/29/2019  2:06 PM  Lumen #1 Status Flushed;Saline locked;Blood return noted 05/29/2019  2:06 PM  Lumen #2 Status Flushed;Saline locked;Blood return noted 05/29/2019  2:06 PM  Lumen #3 Status Flushed;Saline locked;Blood return noted 05/29/2019  2:06 PM  Dressing Type Transparent;Securing device 05/29/2019  2:06 PM  Dressing Status Clean;Dry;Intact;Antimicrobial disc in place 05/29/2019  2:06 PM  Dressing Intervention New dressing 05/29/2019  2:06 PM  Dressing Change Due 06/05/19 05/29/2019  2:06 PM       Enos Fling 05/29/2019, 2:08 PM

## 2019-05-29 NOTE — Progress Notes (Signed)
Pharmacy Antibiotic Note  Lance Mason is a 63 y.o. male admitted on 05-22-19. Patient has been on vancomycin and cefepime. Cefepime d/c'ed today. WBC wnl. Afebrile but chest xray concerning for HCAP. Patient is culture negative so far. SCr stable   Plan:  Cefepime 2g IV q8h  Monitor cultures and clinical progress     Height: 5\' 5"  (165.1 cm) Weight: (bed scale incorrect, can't get accurate weight) IBW/kg (Calculated) : 61.5  Temp (24hrs), Avg:99.8 F (37.7 C), Min:99.2 F (37.3 C), Max:100.5 F (38.1 C)  Recent Labs  Lab 05/25/19 0100 05/26/19 0120 05/26/19 1140 05/26/19 1613 05/27/19 0150 05/27/19 1435 05/28/19 0357 05/28/19 0427 05/29/19 0215  WBC 14.4* 25.9*  --   --  9.8  --  6.7  --  8.8  CREATININE 0.63 0.77  --   --  0.52*  --   --  0.46* 0.43*  LATICACIDVEN  --   --  2.7* 3.0*  --  2.4*  --   --   --     Estimated Creatinine Clearance: 105.7 mL/min (A) (by C-G formula based on SCr of 0.43 mg/dL (L)).    No Known Allergies  Antimicrobials this admission: 5/26 cefepime >>6/2, resumed 6/7 >>  5/26 zmax >> 6/2 5/26 vancomycin >>5/28, resumed 6/7 >> 6/10 5/27 Actemra  5/27 Remdesivir >> 5/31 5/29 Convalescent plasma  6/2 Fluconazole >> (6/8)  Dose adjustments this admission:   Microbiology results: 5/26 BCx2L: negF 5/20 COVID positive  5/27 MRSA PCR: neg  5/31 TA: rare GPC 5/31 BCx: NGF 6/7 TA: normal flora 6/7 MRSA PCR:  6/7 BCx: ngtd   Thank you for allowing pharmacy to be a part of this patient's care.  Albertina Parr, PharmD., BCPS Clinical Pharmacist Clinical phone for 05/29/19 until 5pm: [phone]

## 2019-05-29 NOTE — Progress Notes (Addendum)
CRITICAL VALUE ALERT  Critical Value:  CBG  37  Provider Notified: Maryland Pink, MD   Orders Received/Actions taken: hypoglycemia protocol with standing orders followed.    Maryland Pink, MD notified due to second occurrence during this shift. Awaiting response. Will continue to closely monitor patient.

## 2019-05-29 NOTE — Progress Notes (Signed)
SLP Cancellation Note  Patient Details Name: Ed Mandich MRN: [id number] DOB: [birth year]   Cancelled treatment:       Reason Eval/Treat Not Completed: Patient not medically ready.  Reintubated.  Our service will sign off.    Juan Quam Laurice 05/29/2019, 8:39 AM

## 2019-05-29 NOTE — Progress Notes (Signed)
Dilaudid gtt increased from 2mg /hr to 3mg /hr due to double stacking on the ventilator. Will continue to closely monitor and make changes as needed.

## 2019-05-29 NOTE — Progress Notes (Addendum)
NAME:  Lance Mason, MRN:  [id number], DOB:  1956-02-09, LOS: 15 ADMISSION DATE:  Jul 24, 2019, CONSULTATION DATE: May 15, 2019 REFERRING MD: Dr. Kerry HoughMemon, CHIEF COMPLAINT: Dyspnea  Brief History   63 year old male with diabetes type 2, hypertension admitted on May 26 with acute respiratory failure with hypoxemia due to COVID-19 pneumonia.  Past Medical History  Hypertension Hyperlipidemia DM2  Significant Hospital Events   May 26 admission May 29 intubation, mechanical ventilation in setting of profound encephalopathy May 31 worsening dyssynchrony, hypoxemia, added vecuronium June 1 chemical paralysis used overnight  June 2-5 weaning FiO2/PEEP June 6 weaning well on pressure support June 7 worsening oxygenation/tachycardia fever, started on antibiotics again June 9 following commands, weaning, extubated June 10 vomited, hypoxemic, re-intubated  Consults:  Pulmonary and critical care medicine  Procedures:  Endotracheal tube May 29 > June 9 L subclavian CVL May 29 >  Endotracheal tube June 10 >   Significant Diagnostic Tests:  CT angiogram chest May 27 diffuse bilateral patchy airspace disease groundglass upper lobes predominant, no pulmonary embolism June 1 bilateral lower ext doppler > neg DVT June 1 TTE >>>LVEF > 65%, RV normal but poorly visualized CT head 6/7 > NAICP CT chest 6/8 > No PE, findings consistent with viral pneumonia, increased RLL consolidation, reactive subcarinal lymph node  Micro Data:  May 21 SARS-CoV-2 positive May 26 blood cultures June 7 resp culture > GPC, GNR, GPR > OPF June 7 blood culture >   Antimicrobials/COVID treatment  May 26 azithromycin > June 2 May 27 cefepime > June 2 May 27 vancomycin > May 31 June 2 Fluconazole >  May 27 remdesivir May 27 Tocilizumab x1 5/27 May 29 Convalescent plasma   June 7 Vanc > 6/9 June 7 Cefepime > 6/9  Interim history/subjective:   Extubated 6/9 Agitated, required haldol frequently  Tachypnea this AM Vomited this AM Re-intubated emergently  Objective   Blood pressure (!) 150/92, pulse (!) 126, temperature 99.8 F (37.7 C), temperature source Axillary, resp. rate (!) 31, height 5\' 5"  (1.651 m), weight 105.6 kg, SpO2 94 %.    Vent Mode: CPAP;PSV FiO2 (%):  [40 %-100 %] 70 % PEEP:  [5 cmH20] 5 cmH20 Pressure Support:  [5 cmH20-8 cmH20] 8 cmH20   Intake/Output Summary (Last 24 hours) at 05/29/2019 0725 Last data filed at 05/29/2019 0400 Gross per 24 hour  Intake 1075.33 ml  Output 1610 ml  Net -534.67 ml   Filed Weights    Examination:  General:  Tachypnea, grey in color, non-verbal HENT: NCAT OP clear PULM: CTA B, increased work of breathing CV: RRR, no mgr GI: BS+, soft, nontender MSK: normal bulk and tone Neuro: lethargic, minimally responsive  June 7 chest x-ray images independently reviewed showing bibasilar opacification, airspace disease   Resolved Hospital Problem list     Assessment & Plan:  ARDS due to COVID pneumonia: Oxygenation worse June 7, concern for healthcare associated pneumonia versus worsening COVID Reintubate now Standard/conventional vent protocol: 7cc/kgIBW, Rate 24 CXR/ABG post intubation VAP prevention Restart PAD protocol: see below   Concern for healthcare associated pneumonia on May 26, 2019: no evidence, most likely COVID, RLL consolidation Stop antibiotics Monitor for fever, WBC, etc  Need for sedation/ventilator synchrony Acute encephalopathy, ICU delirium RASS goal 0 to -1 Start precedex via PAD protocol Start prn hydromorphone and versed Continue quetiapine Continue prn haldol Monitor QTc  Best practice:  Diet: Tube feeding Pain/Anxiety/Delirium protocol (if indicated): yes, as above VAP protocol (if indicated): yes DVT prophylaxis:  lovenox bid per COVID protocol GI prophylaxis: famotidine Glucose control: SSI Mobility: passive range of motion in bed Code Status: full Family Communication: per [facility]  Disposition: remain in ICU  Labs   CBC: Recent Labs  Lab 05/24/19 0130  05/25/19 0100 05/26/19 0120 05/26/19 0857 05/27/19 0150 05/27/19 0918 05/28/19 0357  WBC 13.6*  --  14.4* 25.9*  --  9.8  --  6.7  HGB 13.4   < > 13.8 15.0 13.9 11.8* 11.9* 11.2*  HCT 41.0   < > 43.1 46.1 41.0 37.3* 35.0* 35.8*  MCV 94.0  --  96.0 97.3  --  98.4  --  100.0  PLT 185  --  209 298  --  149*  --  143*   < > = values in this interval not displayed.    Basic Metabolic Panel: Recent Labs  Lab 05/24/19 0130  05/25/19 0100 05/26/19 0120 05/26/19 0857 05/27/19 0150 05/27/19 0918 05/28/19 0427  NA 138   < > 132* 137 137 141 141 140  K 3.7   < > 4.3 4.0 3.7 3.6 3.7 3.7  CL 90*  --  83* 91*  --  101  --  104  CO2 38*  --  36* 30  --  34*  --  30  GLUCOSE 139*  --  256* 342*  --  217*  --  137*  BUN 39*  --  55* 54*  --  43*  --  34*  CREATININE 0.60*  --  0.63 0.77  --  0.52*  --  0.46*  CALCIUM 8.6*  --  7.9* 8.8*  --  7.9*  --  7.8*   < > = values in this interval not displayed.   GFR: Estimated Creatinine Clearance: 105.7 mL/min (A) (by C-G formula based on SCr of 0.46 mg/dL (L)). Recent Labs  Lab 05/25/19 0100 05/26/19 0120 05/26/19 1140 05/26/19 1613 05/27/19 0150 05/27/19 1435 05/28/19 0357  WBC 14.4* 25.9*  --   --  9.8  --  6.7  LATICACIDVEN  --   --  2.7* 3.0*  --  2.4*  --     Liver Function Tests: Recent Labs  Lab 05/24/19 0130 05/25/19 0100 05/26/19 0120 05/27/19 0150 05/28/19 0427  AST 64* 58* 58* 67* 69*  ALT 93* 119* 128* 120* 117*  ALKPHOS 124 123 136* 101 121  BILITOT 0.4 0.7 0.7 0.5 0.3  PROT 5.9* 6.2* 6.4* 4.8* 4.8*  ALBUMIN 2.7* 2.8* 2.9* 2.2* 2.2*   No results for input(s): LIPASE, AMYLASE in the last 168 hours. No results for input(s): AMMONIA in the last 168 hours.  ABG    Component Value Date/Time   PHART 7.421 05/27/2019 0918   PCO2ART 52.0 (H) 05/27/2019 0918   PO2ART 72.0 (L) 05/27/2019 0918   HCO3 33.8 (H) 05/27/2019 0918   TCO2 35  (H) 05/27/2019 0918   ACIDBASEDEF 3.0 (H) 05/18/2019 1601   O2SAT 94.0 05/27/2019 0918     Coagulation Profile: No results for input(s): INR, PROTIME in the last 168 hours.  Cardiac Enzymes: Recent Labs  Lab 05/27/19 2018  TROPONINI <0.03    HbA1C: Hemoglobin A1C  Date/Time Value Ref Range Status  06/19/2018 04:01 PM 9.1 (A) 4.0 - 5.6 % Final  01/02/2017 06:51 PM 12.8  Final   Hgb A1c MFr Bld  Date/Time Value Ref Range Status  05/23/2019 02:20 AM 9.9 (H) 4.8 - 5.6 % Final    Comment:    (NOTE) Pre diabetes:  5.7%-6.4% Diabetes:              >6.4% Glycemic control for   <7.0% adults with diabetes     CBG: Recent Labs  Lab 05/28/19 1200 05/28/19 2009 05/28/19 2318 05/29/19 0215 05/29/19 0344  GLUCAP 71 75 99 109* 104*       Critical care time: 35 minutes The patient remains critically ill requiring mechanical ventilation, adjustment of continuous sedation medicines and management of multiple databases of information.  Multidisciplinary rounding coordinated by pulmonary critical care service in conjunction with hospitalist team.     Heber CarolinaBrent Keyon Winnick, MD [place] [facility] Pager: [phone] Cell: [phone] If no response, call [phone]

## 2019-05-29 NOTE — Progress Notes (Signed)
eLink Physician-Brief Progress Note Patient Name: Lance Mason DOB: January 10, 1956 MRN: [id number]   Date of Service  05/29/2019  HPI/Events of Note  Ventilator Dys-synchrony.   eICU Interventions  Will order: 1. Increase ceiling on Precedex IV infusion to 1.7 mcg/kg/hour.     Intervention Category Major Interventions: Other:;Respiratory failure - evaluation and management  [name] 05/29/2019, 9:47 PM

## 2019-05-29 NOTE — Progress Notes (Addendum)
OT Cancellation Note  Patient Details Name: [name] MRN: [id number] DOB: 05-14-1956   Cancelled Treatment:    Reason Eval/Treat Not Completed: Patient at procedure or test/ unavailable(@ 13:37 PICC line being placed. Will return as schedule allows. Thank you.)  @[id number]  - Pt reintubated and sedated. Will sign off and please reconsult when medically stable. Thank you.   Harrison, OTR/L Acute Rehab Pager: [phone] Office: [phone] 05/29/2019, 1:37 PM

## 2019-05-30 ENCOUNTER — Other Ambulatory Visit: Payer: Self-pay

## 2019-05-30 LAB — COMPREHENSIVE METABOLIC PANEL
ALT: 82 U/L — ABNORMAL HIGH (ref 0–44)
AST: 58 U/L — ABNORMAL HIGH (ref 15–41)
Albumin: 2.1 g/dL — ABNORMAL LOW (ref 3.5–5.0)
Alkaline Phosphatase: 180 U/L — ABNORMAL HIGH (ref 38–126)
Anion gap: 9 (ref 5–15)
BUN: 16 mg/dL (ref 8–23)
CO2: 22 mmol/L (ref 22–32)
Calcium: 6.6 mg/dL — ABNORMAL LOW (ref 8.9–10.3)
Chloride: 114 mmol/L — ABNORMAL HIGH (ref 98–111)
Creatinine, Ser: 0.52 mg/dL — ABNORMAL LOW (ref 0.61–1.24)
GFR calc Af Amer: >60 mL/min (ref >60)
GFR calc non Af Amer: >60 mL/min (ref >60)
Glucose, Bld: 91 mg/dL (ref 70–99)
Potassium: 3.5 mmol/L (ref 3.5–5.1)
Sodium: 145 mmol/L (ref 135–145)
Total Bilirubin: 0.2 mg/dL — ABNORMAL LOW (ref 0.3–1.2)
Total Protein: 5.1 g/dL — ABNORMAL LOW (ref 6.5–8.1)

## 2019-05-30 LAB — POCT I-STAT 7, (LYTES, BLD GAS, ICA,H+H)
Acid-base deficit: 5 mmol/L — ABNORMAL HIGH (ref 0.0–2.0)
Acid-base deficit: 5 mmol/L — ABNORMAL HIGH (ref 0.0–2.0)
Acid-base deficit: 5 mmol/L — ABNORMAL HIGH (ref 0.0–2.0)
Bicarbonate: 25 mmol/L (ref 20.0–28.0)
Bicarbonate: 25.1 mmol/L (ref 20.0–28.0)
Bicarbonate: 24.4 mmol/L (ref 20.0–28.0)
Calcium, Ion: 1.13 mmol/L — ABNORMAL LOW (ref 1.15–1.40)
Calcium, Ion: 1.13 mmol/L — ABNORMAL LOW (ref 1.15–1.40)
Calcium, Ion: 1.15 mmol/L (ref 1.15–1.40)
HCT: 36 % — ABNORMAL LOW (ref 39.0–52.0)
HCT: 37 % — ABNORMAL LOW (ref 39.0–52.0)
HCT: 34 % — ABNORMAL LOW (ref 39.0–52.0)
Hemoglobin: 12.2 g/dL — ABNORMAL LOW (ref 13.0–17.0)
Hemoglobin: 12.6 g/dL — ABNORMAL LOW (ref 13.0–17.0)
Hemoglobin: 11.6 g/dL — ABNORMAL LOW (ref 13.0–17.0)
O2 Saturation: 64 %
O2 Saturation: 71 %
O2 Saturation: 88 %
Patient temperature: 100
Patient temperature: 100
Patient temperature: 99.6
Potassium: 3.8 mmol/L (ref 3.5–5.1)
Potassium: 3.9 mmol/L (ref 3.5–5.1)
Potassium: 4.7 mmol/L (ref 3.5–5.1)
Sodium: 149 mmol/L — ABNORMAL HIGH (ref 135–145)
Sodium: 149 mmol/L — ABNORMAL HIGH (ref 135–145)
Sodium: 147 mmol/L — ABNORMAL HIGH (ref 135–145)
TCO2: 27 mmol/L (ref 22–32)
TCO2: 27 mmol/L (ref 22–32)
TCO2: 26 mmol/L (ref 22–32)
pCO2 arterial: 75.6 mmHg (ref 32.0–48.0)
pCO2 arterial: 76.4 mmHg (ref 32.0–48.0)
pCO2 arterial: 66.3 mmHg (ref 32.0–48.0)
pH, Arterial: 7.128 — CL (ref 7.350–7.450)
pH, Arterial: 7.133 — CL (ref 7.350–7.450)
pH, Arterial: 7.177 — CL (ref 7.350–7.450)
pO2, Arterial: 47 mmHg — ABNORMAL LOW (ref 83.0–108.0)
pO2, Arterial: 52 mmHg — ABNORMAL LOW (ref 83.0–108.0)
pO2, Arterial: 72 mmHg — ABNORMAL LOW (ref 83.0–108.0)

## 2019-05-30 LAB — CBC
HCT: 37 % — ABNORMAL LOW (ref 39.0–52.0)
Hemoglobin: 11.6 g/dL — ABNORMAL LOW (ref 13.0–17.0)
MCH: 31.4 pg (ref 26.0–34.0)
MCHC: 31.4 g/dL (ref 30.0–36.0)
MCV: 100.3 fL — ABNORMAL HIGH (ref 80.0–100.0)
Platelets: 146 10*3/uL — ABNORMAL LOW (ref 150–400)
RBC: 3.69 MIL/uL — ABNORMAL LOW (ref 4.22–5.81)
RDW: 15.2 % (ref 11.5–15.5)
WBC: 9.1 10*3/uL (ref 4.0–10.5)
nRBC: 0 % (ref 0.0–0.2)

## 2019-05-30 LAB — GLUCOSE, CAPILLARY
Glucose-Capillary: 155 mg/dL — ABNORMAL HIGH (ref 70–99)
Glucose-Capillary: 61 mg/dL — ABNORMAL LOW (ref 70–99)
Glucose-Capillary: 67 mg/dL — ABNORMAL LOW (ref 70–99)
Glucose-Capillary: 91 mg/dL (ref 70–99)
Glucose-Capillary: 108 mg/dL — ABNORMAL HIGH (ref 70–99)
Glucose-Capillary: 196 mg/dL — ABNORMAL HIGH (ref 70–99)
Glucose-Capillary: 179 mg/dL — ABNORMAL HIGH (ref 70–99)

## 2019-05-30 LAB — PROCALCITONIN: Procalcitonin: 0.78 ng/mL

## 2019-05-30 LAB — MAGNESIUM: Magnesium: 2.1 mg/dL (ref 1.7–2.4)

## 2019-05-30 LAB — C-REACTIVE PROTEIN: CRP: 10.7 mg/dL — ABNORMAL HIGH (ref <1.0)

## 2019-05-30 MED ORDER — ADENOSINE 6 MG/2ML IV SOLN
INTRAVENOUS | Status: AC
  Filled 2019-05-30: qty 2

## 2019-05-30 MED ORDER — SODIUM BICARBONATE 8.4 % IV SOLN
INTRAVENOUS | Status: AC
  Administered 2019-05-31: 50 meq
  Filled 2019-05-30: qty 50

## 2019-05-30 MED ORDER — METRONIDAZOLE 50 MG/ML ORAL SUSPENSION
500.0000 mg | Freq: Three times a day (TID) | ORAL | Status: DC
  Administered 2019-05-30 (×2): 500 mg
  Filled 2019-05-30 (×4): qty 10

## 2019-05-30 MED ORDER — STERILE WATER FOR INJECTION IV SOLN
INTRAVENOUS | Status: DC
  Administered 2019-05-31 – 2019-06-01 (×4): via INTRAVENOUS
  Filled 2019-05-30 (×9): qty 850

## 2019-05-30 MED ORDER — METRONIDAZOLE 50 MG/ML ORAL SUSPENSION: 500.0000 mg | Freq: Three times a day (TID) | ORAL | Status: DC

## 2019-05-30 MED ORDER — MIDAZOLAM HCL 2 MG/2ML IJ SOLN
1.0000 mg | INTRAMUSCULAR | Status: DC | PRN
  Administered 2019-05-30 – 2019-06-01 (×7): 2 mg via INTRAVENOUS
  Administered 2019-06-01: 08:00:00 1 mg via INTRAVENOUS
  Administered 2019-06-01 (×5): 2 mg via INTRAVENOUS
  Administered 2019-06-01: 1 mg via INTRAVENOUS
  Administered 2019-06-02 (×6): 2 mg via INTRAVENOUS
  Filled 2019-05-30 (×4): qty 2

## 2019-05-30 MED ORDER — SODIUM BICARBONATE 8.4 % IV SOLN
INTRAVENOUS | Status: AC
  Administered 2019-05-31: 100 meq via INTRAVENOUS
  Filled 2019-05-30: qty 50

## 2019-05-30 MED ORDER — EPINEPHRINE PF 1 MG/ML IJ SOLN
0.5000 ug/min | INTRAVENOUS | Status: DC
  Filled 2019-05-30: qty 4

## 2019-05-30 MED ORDER — AMIODARONE LOAD VIA INFUSION
150.0000 mg | Freq: Once | INTRAVENOUS | Status: AC
  Administered 2019-05-30: 150 mg via INTRAVENOUS
  Filled 2019-05-30: qty 83.34

## 2019-05-30 MED ORDER — AMIODARONE HCL IN DEXTROSE 360-4.14 MG/200ML-% IV SOLN
60.0000 mg/h | INTRAVENOUS | Status: AC
  Administered 2019-05-30 (×2): 60 mg/h via INTRAVENOUS
  Filled 2019-05-30: qty 400

## 2019-05-30 MED ORDER — MIDAZOLAM 50MG/50ML (1MG/ML) PREMIX INFUSION
1.0000 mg/h | INTRAVENOUS | Status: DC
  Administered 2019-05-30: 0.5 mg/h via INTRAVENOUS
  Administered 2019-05-31: 18:00:00 1.5 mg/h via INTRAVENOUS
  Administered 2019-06-01 – 2019-06-06 (×5): 1 mg/h via INTRAVENOUS
  Administered 2019-06-07: 12:00:00 3.5 mg/h via INTRAVENOUS
  Administered 2019-06-07: 01:00:00 3 mg/h via INTRAVENOUS
  Administered 2019-06-07 – 2019-06-08 (×3): 5 mg/h via INTRAVENOUS
  Administered 2019-06-08 – 2019-06-09 (×4): 7 mg/h via INTRAVENOUS
  Filled 2019-05-30 (×17): qty 50

## 2019-05-30 MED ORDER — JEVITY 1.2 CAL PO LIQD: 1000.0000 mL | ORAL | Status: DC

## 2019-05-30 MED ORDER — EPINEPHRINE PF 1 MG/ML IJ SOLN
0.5000 ug/min | INTRAVENOUS | Status: DC
  Administered 2019-05-31: 15 ug/min via INTRAVENOUS
  Filled 2019-05-30: qty 4

## 2019-05-30 MED ORDER — GLUCERNA 1.2 CAL PO LIQD
1000.0000 mL | ORAL | Status: DC
  Administered 2019-05-30 – 2019-06-04 (×4): 1000 mL
  Filled 2019-05-30 (×4): qty 1000

## 2019-05-30 MED ORDER — VECURONIUM BROMIDE 10 MG IV SOLR
10.0000 mg | Freq: Once | INTRAVENOUS | Status: AC
  Administered 2019-05-30: 10 mg via INTRAVENOUS

## 2019-05-30 MED ORDER — NOREPINEPHRINE 4 MG/250ML-% IV SOLN
INTRAVENOUS | Status: AC
  Filled 2019-05-30: qty 250

## 2019-05-30 MED ORDER — PRO-STAT SUGAR FREE PO LIQD
60.0000 mL | Freq: Three times a day (TID) | ORAL | Status: DC
  Administered 2019-05-30 – 2019-06-04 (×12): 60 mL
  Filled 2019-05-30 (×12): qty 60

## 2019-05-30 MED ORDER — AMIODARONE HCL IN DEXTROSE 360-4.14 MG/200ML-% IV SOLN
30.0000 mg/h | INTRAVENOUS | Status: DC
  Filled 2019-05-30: qty 200

## 2019-05-30 MED ORDER — DEXTROSE 50 % IV SOLN
1.0000 | Freq: Once | INTRAVENOUS | Status: AC
  Administered 2019-05-30: 01:00:00 50 mL via INTRAVENOUS

## 2019-05-30 MED ORDER — DEXTROSE 50 % IV SOLN
INTRAVENOUS | Status: AC
  Administered 2019-05-30: 50 mL via INTRAVENOUS
  Filled 2019-05-30: qty 50

## 2019-05-30 MED ORDER — VECURONIUM BROMIDE 10 MG IV SOLR
10.0000 mg | Freq: Once | INTRAVENOUS | Status: AC
  Administered 2019-05-30: 10 mg via INTRAVENOUS
  Filled 2019-05-30: qty 10

## 2019-05-30 MED ORDER — CHLORHEXIDINE GLUCONATE 0.12 % MT SOLN
15.0000 mL | Freq: Two times a day (BID) | OROMUCOSAL | Status: DC
  Administered 2019-05-30 – 2019-06-09 (×21): 15 mL via OROMUCOSAL
  Filled 2019-05-30 (×10): qty 15

## 2019-05-30 MED ORDER — DEXTROSE 50 % IV SOLN
12.5000 g | INTRAVENOUS | Status: AC
  Administered 2019-05-30: 12.5 g via INTRAVENOUS
  Filled 2019-05-30: qty 50

## 2019-05-30 NOTE — Progress Notes (Signed)
RT NOTE:  PT remains in PRONE position.  RT assisted with head turn to left. ETT secured.

## 2019-05-30 NOTE — Progress Notes (Signed)
Pt proned at 1947 with no complications. Pt stable throughout. VS within normal limits

## 2019-05-30 NOTE — Plan of Care (Signed)
Continue current plan of care.

## 2019-05-30 NOTE — Progress Notes (Signed)
Bladder scan >481ml. Per [place] Pink, MD I/O cath. 718ml of amber urine returned. Ronny Bacon, RN at bedside with this RN. Will continue to closely monitor.

## 2019-05-30 NOTE — Progress Notes (Addendum)
Nutrition Follow-up  DOCUMENTATION CODES:   Obesity unspecified  INTERVENTION:   Resume TF via OGT:   Glucerna 1.2 at 30 ml/h   Pro-stat 60 ml TID   Provides 1464 kcal, 133 gm protein, 580 ml free water, 82 gm CHO daily  Continue Juven BID via tube, each packet provides 80 calories, 8 grams of carbohydrate, 2.5  grams of protein (collagen), 7 grams of L-arginine and 7 grams of L-glutamine; supplement contains CaHMB, Vitamins C, E, B12 and Zinc to promote wound healing  Recommend obtain at least weekly weights   NUTRITION DIAGNOSIS:   Increased nutrient needs related to acute illness(COVID-19) as evidenced by estimated needs.  Ongoing  GOAL:   Provide needs based on ASPEN/SCCM guidelines  Unmet, TF is off  MONITOR:   Vent status, Labs, Skin, I & O's  REASON FOR ASSESSMENT:   Malnutrition Screening Tool    ASSESSMENT:   63 yo Spanish speaking male with PMH of DM, HTN, HLD who was admitted with SOB and cough x 1 week. COVID-19 positive on 5/21.  Patient was extubated 6/9. Tube feeding was discontinued after extubation on 6/9.   Required re-intubation 6/10. Currently has OG tube.  Patient is currently intubated on ventilator support MV: 11.9 L/min Temp (24hrs), Avg:101.2 F (38.4 C), Min:99.1 F (37.3 C), Max:102.2 F (39 C)  MAP range 53-96 this morning (mostly >/= 78)  Labs reviewed.  CBG's: [phone] Patient had 2 very low glucoses last evening, 29 and 37.  Medications reviewed and include Novolog, MVI, Juven, Neosynephrine.   No new weight since 6/1, recommend weighing at least weekly.  I/O net positive 4.8 L   Diet Order:   Diet Order            Diet NPO time specified  Diet effective now              EDUCATION NEEDS:   No education needs have been identified at this time  Skin:  Skin Assessment: Skin Integrity Issues: Skin Integrity Issues:: Stage II, Other (Comment) Stage II: buttocks Unstageable: N/A Other: MASD to  buttocks, skin tear to sacrum, blister to buttocks  Last BM:  6/10 (type 7)  Height:   Ht Readings from Last 1 Encounters:  04/25/2019 5\' 5"  (1.651 m)    Weight:   Wt Readings from Last 1 Encounters:  05/08/19 75.8 kg    Ideal Body Weight:  61.8 kg  BMI:  Body mass index is 38.74 kg/m.  Estimated Nutritional Needs:   Kcal:  1160-1480  Protein:  124 gm  Fluid:  2 L    Molli Barrows, RD, LDN, [place] Pager [phone] After Hours Pager [phone]

## 2019-05-30 NOTE — Progress Notes (Signed)
Spoke with granddaughter, Hassan Rowan and gave full update. Granddaughter grateful for the call and had no further questions at this time.

## 2019-05-30 NOTE — Progress Notes (Addendum)
NAME:  Lance Mason, MRN:  [id number], DOB:  27-Oct-1956, LOS: 20 ADMISSION DATE:  05/19/2019, CONSULTATION DATE: May 15, 2019 REFERRING MD: Dr. Roderic Palau, CHIEF COMPLAINT: Dyspnea  Brief History   63 year old male with diabetes type 2, hypertension admitted on May 26 with acute respiratory failure with hypoxemia due to COVID-19 pneumonia.  Past Medical History  Hypertension Hyperlipidemia DM2  Significant Hospital Events   May 26 admission May 29 intubation, mechanical ventilation in setting of profound encephalopathy May 31 worsening dyssynchrony, hypoxemia, added vecuronium June 1 chemical paralysis used overnight  June 2-5 weaning FiO2/PEEP June 6 weaning well on pressure support June 7 worsening oxygenation/tachycardia fever, started on antibiotics again June 9 following commands, weaning, extubated June 10 vomited, hypoxemic, re-intubated  Consults:  Pulmonary and critical care medicine  Procedures:  Endotracheal tube May 29 > June 9 L subclavian CVL May 29 >  Endotracheal tube June 10 >   Significant Diagnostic Tests:  CT angiogram chest May 27 diffuse bilateral patchy airspace disease groundglass upper lobes predominant, no pulmonary embolism June 1 bilateral lower ext doppler > neg DVT June 1 TTE >>>LVEF > 65%, RV normal but poorly visualized CT head 6/7 > NAICP CT chest 6/8 > No PE, findings consistent with viral pneumonia, increased RLL consolidation, reactive subcarinal lymph node  Micro Data:  May 21 SARS-CoV-2 positive May 26 blood cultures June 7 resp culture > GPC, GNR, GPR > OPF June 7 blood culture >   Antimicrobials/COVID treatment  May 26 azithromycin > June 2 May 27 cefepime > June 2 May 27 vancomycin > May 31 June 2 Fluconazole >  May 27 remdesivir May 27 Tocilizumab x1 5/27 May 29 Convalescent plasma   June 7 Vanc > 6/9 June 7 Cefepime > 6/9, restart 6/10  Interim history/subjective:  Remains on vent Issues with bradycardia,  then a fib Vent dyssynchrony overnight  Objective   Blood pressure 110/66, pulse (!) 130, temperature (!) 100.7 F (38.2 C), temperature source Axillary, resp. rate (!) 22, height 5\' 5"  (1.651 m), weight 105.6 kg, SpO2 92 %.    Vent Mode: PRVC FiO2 (%):  [50 %-80 %] 80 % Set Rate:  [30 bmp] 30 bmp Vt Set:  [450 mL] 450 mL PEEP:  [10 cmH20] 10 cmH20 Plateau Pressure:  [24 cmH20-28 cmH20] 28 cmH20   Intake/Output Summary (Last 24 hours) at 05/30/2019 3846 Last data filed at 05/30/2019 0710 Gross per 24 hour  Intake 5847.98 ml  Output 2850 ml  Net 2997.98 ml   Filed Weights    Examination: Gen:      No acute distress HEENT:  EOMI, sclera anicteric Neck:     No masses; no thyromegaly, ETT Lungs:    Clear to auscultation bilaterally; normal respiratory effort CV:         Regular rate and rhythm; no murmurs Abd:      + bowel sounds; soft, non-tender; no palpable masses, no distension Ext:    No edema; adequate peripheral perfusion Skin:      Warm and dry; no rash Neuro: Sedated, unresponsive  Resolved Hospital Problem list     Assessment & Plan:  ARDS due to COVID pneumonia: Oxygenation worse June 7, concern for healthcare associated pneumonia versus worsening COVID Increase PEEP to 14 to target driving pressure of 15 Keep Plt less than 30 Low TV ventilation. 6 cc/kg Will need better sedation to prevent vent dysynchrony One dose paralytics Follow ABG. If P/F ratio < 150 then will prone  Concern  for healthcare associated pneumonia on May 26, 2019: no evidence, most likely COVID, RLL consolidation Restarted on cefepime Follow cultures  A fib Continue amio Wean off neo  Need for sedation/ventilator synchrony Acute encephalopathy, ICU delirium RASS goal -2 Dilaudid Start versed gtt Continue quetiapine Continue prn haldol Monitor QTc  Best practice:  Diet: Tube feeding Pain/Anxiety/Delirium protocol (if indicated): yes, as above VAP protocol (if indicated): yes  DVT prophylaxis: lovenox bid per COVID protocol GI prophylaxis: famotidine Glucose control: SSI Mobility: passive range of motion in bed Code Status: full Family Communication: Daughter called and updated over telephone via interpreter.  She asked that I call her niece Merideth AbbeyVioleta and give updated as she is able to speak english. Contact info updated.  Disposition: remain in ICU  The patient is critically ill with multiple organ system failure and requires high complexity decision making for assessment and support, frequent evaluation and titration of therapies, advanced monitoring, review of radiographic studies and interpretation of complex data.   Critical Care Time devoted to patient care services, exclusive of separately billable procedures, described in this note is 35 minutes.   Chilton GreathousePraveen Demarquis Osley MD [facility] Pager [phone] If no answer call [phone] 05/30/2019, 8:22 AM

## 2019-05-30 NOTE — Progress Notes (Addendum)
eLink Physician-Brief Progress Note Patient Name: Lance Mason DOB: [birth year] MRN: [id number]   Date of Service  05/30/2019  HPI/Events of Note  AFIB with RVR - Ventricular rate = 150's to 170's. Patient is on Phenylephrine IV infusion for hemodynamic support. BP = 114/87.  eICU Interventions  Will order: 1. Amiodarone IV load and infusion. 2. Magnesium level STAT. 3. Please send AM labs now.      Intervention Category Major Interventions: Arrhythmia - evaluation and management  Sommer,Steven Eugene 05/30/2019, 5:02 AM

## 2019-05-30 NOTE — Progress Notes (Signed)
Patient not synchronous with vent. Continuous alarming. ELink contacted. 1x dose of vecuronium ordered and given. Pt synchronous for a few minutes but now desynchronous with vent again. Multiple versed and dilaudid boluses given through shift.

## 2019-05-30 NOTE — Progress Notes (Addendum)
PROGRESS NOTE  Lance Mason  WJX:[id number] DOB: [birth year] DOA: 11-10-2019 PCP: Patient, No Pcp Per   Brief Narrative: Lance Mason is a 63 y.o. male with a history of T2DM, HTN, obesity who presented with cough and shortness of breath and fever found to be hypoxic requiring NRB. CXR demonstrated bilateral opacities and he was [facility] for covid-19 pneumonia, ultimately intubated.  Assessment & Plan: Principal Problem:   Acute respiratory failure with hypoxemia (HCC) Active Problems:   Type 2 diabetes mellitus without complication, without long-term current use of insulin (HCC)   Pure hypercholesterolemia   Pneumonia due to COVID-19 virus   CAP (community acquired pneumonia)   Abnormal liver function   Pressure injury of skin  Acute hypoxic respiratory failure due to covid-19 pneumonia: Reintubated 6/10, febrile overnight. - s/p actemra 5/27, convalescent plasma 5/29 - s/p remdesivir 5/27 - 5/31.  - Continue airborne, contact precautions. PPE including surgical gown, gloves, face shield, cap, shoe covers, and N-95 used during this encounter in a negative pressure room.  - Check daily labs: CBC w/diff, CMP, d-dimer, fibrinogen, ferritin, LDH, CRP - Avoid NSAIDs - s/p actemra 5/27, convalescent plasma 5/29, remdesivir 5/27 - 5/31. - s/p abx x7 days. Added back with suspected aspiration. Add flagyl.   Oral candidiasis: - Has been given fluconazole x7 days.  T2DM: HbA1c 9.9%.  - Hold home metformin, glipizide - Continue SSI, will restart tube feeds at trickle and stop lantus with ongoing hypoglycemia.   Hypotension: BPs trending down, worse with sedation.  - Neo gtt, wean as able - DC old subclavian line, insert PICC  AFib with RVR:  - Amiodarone gtt.   Obesity: BMI: 38. Contributes to poor prognosis - Optimize nutritional status per dietitian  Hypernatremia: Resolved.  Seizure-like activity Nursing noted a few minutes seizure like activity (clonic  jerking of both arms, left leg) that were self-limited in a.m. 6/7, in context of tachycardia.  Patient subsequently agitated and moving all extremities.  No focal weakness.   Also, no further posturing, no further seizure like activity today.  Doubt status. CT head 6/7 unremarkable.  No further jerking.  Suspect this was agitation, not seizure.  DVT prophylaxis: Lovenox Code Status: Full Family Communication: Called granddaughter by phone, no answer this afternoon. Disposition Plan: Remain in ICU  Consultants:   PCCM  Procedures:   5/29 ETT  5/29 - 6/10 L subclavian central line  6/10 PICC  Antimicrobials:  Azithromycin 5/26 >> 6/2  Cefepime 5/27 >> 6/2 and 6/7 >>  Fluconazole 6/2 >> 6/9  Remdesivir 5/27 >> 5/31  Vancomycin 5/26 >> 5/28  And 6/7 >>  Metronidazole 6/11 >>   Subjective: Had vent dyssynchrony/breath stacking overnight so precedex increased overnight. This caused bradycardia and was stopped.   Objective: Vitals:   05/30/19 1000 05/30/19 1015 05/30/19 1030 05/30/19 1045  BP: (!) 93/44 140/65 (!) 150/68 113/65  Pulse: (!) 125 (!) 135 (!) 139 (!) 137  Resp: (!) 28 (!) 30 (!) 30 (!) 30  Temp:      TempSrc:      SpO2: 93% 95% 94% 92%  Height:        Intake/Output Summary (Last 24 hours) at 05/30/2019 1057 Last data filed at 05/30/2019 1000 Gross per 24 hour  Intake 5720.59 ml  Output 2850 ml  Net 2870.59 ml   Filed Weights   Gen: 63 y.o. male in no distress Pulm: Ventilated breaths. Coarse. CV: Regular rate and rhythm. No murmur, rub, or gallop.  No JVD, no dependent edema. GI: Abdomen soft, non-tender, non-distended, with normoactive bowel sounds.  Ext: Warm, no deformities Skin: No rashes, lesions or ulcers on visualized skin. Neuro: Sedated Psych: UTD  Data Reviewed: I have personally reviewed following labs and imaging studies  CBC: Recent Labs  Lab 05/27/19 0150  05/28/19 0357 05/29/19 0215 05/29/19 0743 05/29/19 0939 05/30/19  0430 05/30/19 0839  WBC 9.8  --  6.7 8.8  --   --  QUESTIONABLE RESULTS, RECOMMEND RECOLLECT TO VERIFY 9.1  HGB 11.8*   < > 11.2* 12.5* 12.6* 12.9* QUESTIONABLE RESULTS, RECOMMEND RECOLLECT TO VERIFY 11.6*  HCT 37.3*   < > 35.8* 39.7 37.0* 38.0* QUESTIONABLE RESULTS, RECOMMEND RECOLLECT TO VERIFY 37.0*  MCV 98.4  --  100.0 98.5  --   --  QUESTIONABLE RESULTS, RECOMMEND RECOLLECT TO VERIFY 100.3*  PLT 149*  --  143* 168  --   --  QUESTIONABLE RESULTS, RECOMMEND RECOLLECT TO VERIFY 146*   < > = values in this interval not displayed.   Basic Metabolic Panel: Recent Labs  Lab 05/27/19 0150  05/28/19 0427 05/29/19 0215 05/29/19 0743 05/29/19 0939 05/30/19 0430 05/30/19 0815  NA 141   < > 140 143 143 145 QUESTIONABLE RESULTS, RECOMMEND RECOLLECT TO VERIFY 145  K 3.6   < > 3.7 3.6 3.2* 3.9 QUESTIONABLE RESULTS, RECOMMEND RECOLLECT TO VERIFY 3.5  CL 101  --  104 106  --   --  QUESTIONABLE RESULTS, RECOMMEND RECOLLECT TO VERIFY 114*  CO2 34*  --  30 26  --   --  QUESTIONABLE RESULTS, RECOMMEND RECOLLECT TO VERIFY 22  GLUCOSE 217*  --  137* 81  --   --  QUESTIONABLE RESULTS, RECOMMEND RECOLLECT TO VERIFY 91  BUN 43*  --  34* 17  --   --  QUESTIONABLE RESULTS, RECOMMEND RECOLLECT TO VERIFY 16  CREATININE 0.52*  --  0.46* 0.43*  --   --  QUESTIONABLE RESULTS, RECOMMEND RECOLLECT TO VERIFY 0.52*  CALCIUM 7.9*  --  7.8* 8.0*  --   --  QUESTIONABLE RESULTS, RECOMMEND RECOLLECT TO VERIFY 6.6*  MG  --   --   --   --   --   --  QUESTIONABLE RESULTS, RECOMMEND RECOLLECT TO VERIFY 2.1   < > = values in this interval not displayed.   GFR: Estimated Creatinine Clearance: 105.7 mL/min (A) (by C-G formula based on SCr of 0.52 mg/dL (L)). Liver Function Tests: Recent Labs  Lab 05/27/19 0150 05/28/19 0427 05/29/19 0215 05/30/19 0430 05/30/19 0815  AST 67* 69* 117* QUESTIONABLE RESULTS, RECOMMEND RECOLLECT TO VERIFY 58*  ALT 120* 117* 154* QUESTIONABLE RESULTS, RECOMMEND RECOLLECT TO VERIFY 82*   ALKPHOS 101 121 203* QUESTIONABLE RESULTS, RECOMMEND RECOLLECT TO VERIFY 180*  BILITOT 0.5 0.3 0.9 QUESTIONABLE RESULTS, RECOMMEND RECOLLECT TO VERIFY 0.2*  PROT 4.8* 4.8* 5.7* QUESTIONABLE RESULTS, RECOMMEND RECOLLECT TO VERIFY 5.1*  ALBUMIN 2.2* 2.2* 2.6* QUESTIONABLE RESULTS, RECOMMEND RECOLLECT TO VERIFY 2.1*   No results for input(s): LIPASE, AMYLASE in the last 168 hours. No results for input(s): AMMONIA in the last 168 hours. Coagulation Profile: No results for input(s): INR, PROTIME in the last 168 hours. Cardiac Enzymes: Recent Labs  Lab 05/27/19 2018  TROPONINI <0.03   BNP (last 3 results) No results for input(s): PROBNP in the last 8760 hours. HbA1C: No results for input(s): HGBA1C in the last 72 hours. CBG: Recent Labs  Lab 05/29/19 2316 05/30/19 0052 05/30/19 0122 05/30/19 0423 05/30/19 [id number]  GLUCAP  37* 61* 155* 67* 91   Lipid Profile: No results for input(s): CHOL, HDL, LDLCALC, TRIG, CHOLHDL, LDLDIRECT in the last 72 hours. Thyroid Function Tests: No results for input(s): TSH, T4TOTAL, FREET4, T3FREE, THYROIDAB in the last 72 hours. Anemia Panel: Recent Labs    05/28/19 0427 05/29/19 0210  FERRITIN 785* 1,107*   Urine analysis:    Component Value Date/Time   COLORURINE YELLOW 05/28/2016 2056   APPEARANCEUR CLEAR 05/28/2016 2056   LABSPEC 1.041 (H) 05/28/2016 2056   PHURINE 5.0 05/28/2016 2056   GLUCOSEU >1000 (A) 05/28/2016 2056   HGBUR NEGATIVE 05/28/2016 2056   BILIRUBINUR negative 06/19/2018 1601   KETONESUR negative 06/19/2018 1601   KETONESUR 15 (A) 05/28/2016 2056   PROTEINUR negative 06/19/2018 1601   PROTEINUR NEGATIVE 05/28/2016 2056   UROBILINOGEN 0.2 06/19/2018 1601   NITRITE Negative 06/19/2018 1601   NITRITE NEGATIVE 05/28/2016 2056   LEUKOCYTESUR Negative 06/19/2018 1601   Recent Results (from the past 240 hour(s))  Culture, respiratory (non-expectorated)     Status: None   Collection Time: 05/26/19  8:48 AM   Specimen:  Tracheal Aspirate; Respiratory  Result Value Ref Range Status   Specimen Description   Final    TRACHEAL ASPIRATE Performed at [facility] [address] [place], [place] [address]    Special Requests   Final    NONE Performed at [facility], [place] [address] [place], [place] [address]    Gram Stain   Final    FEW WBC PRESENT, PREDOMINANTLY PMN MODERATE GRAM POSITIVE COCCI MODERATE GRAM NEGATIVE RODS MODERATE GRAM POSITIVE RODS    Culture   Final    MODERATE Consistent with normal respiratory flora. Performed at [facility], [place] [address] [place], [place] [address]    Report Status 05/28/2019 FINAL  Final  Culture, blood (routine x 2)     Status: None (Preliminary result)   Collection Time: 05/26/19  8:49 AM   Specimen: Right Antecubital; Blood  Result Value Ref Range Status   Specimen Description   Final    RIGHT ANTECUBITAL Performed at [facility] [address] [place], [place] [address]    Special Requests   Final    BOTTLES DRAWN AEROBIC ONLY Blood Culture adequate volume Performed at [facility] [address], [place], [place] [address]    Culture   Final    NO GROWTH 4 DAYS Performed at [facility], [place] [address] [place], [place] [address]    Report Status PENDING  Incomplete  Culture, blood (routine x 2)     Status: None (Preliminary result)   Collection Time: 05/26/19  8:54 AM   Specimen: BLOOD RIGHT HAND  Result Value Ref Range Status   Specimen Description   Final    BLOOD RIGHT HAND Performed at [facility] [address], [place], [place] [address]    Special Requests   Final    BOTTLES DRAWN AEROBIC ONLY Blood Culture adequate volume Performed at [facility] [address] [place], [place] [address]    Culture   Final    NO GROWTH 4 DAYS Performed at [facility], [place] [address] [place], [place]  [address]    Report Status PENDING  Incomplete      Radiology Studies: Portable Chest X-ray  Result Date: 05/29/2019 CLINICAL DATA:  Shortness of breath EXAM: PORTABLE CHEST 1 VIEW COMPARISON:  05/26/2019 FINDINGS: Nasogastric tube  coiled in the stomach. Left subclavian central venous catheter with the tip projecting over the SVC. Endotracheal tube with the tip 3 cm above the carina. Bilateral diffuse interstitial and alveolar airspace opacities. No pleural effusion. Bilateral pleural plaques along the diaphragmatic surfaces. No pneumothorax. Stable cardiomegaly. No acute osseous abnormality. IMPRESSION: 1. Support lines and tubing in satisfactory position. 2. Worsening bilateral alveolar and interstitial airspace disease concerning for worsening pulmonary edema versus multilobar pneumonia. Electronically Signed   By: Elige KoHetal  Patel   On: 05/29/2019 09:32   [place] Ekg Site Rite  Result Date: 05/29/2019 If Site Rite image not attached, placement could not be confirmed due to current cardiac rhythm.   Scheduled Meds: . adenosine      . chlorhexidine  15 mL Mouth/Throat BID  . Chlorhexidine Gluconate Cloth  6 each Topical Daily  . enoxaparin (LOVENOX) injection  50 mg Subcutaneous Q12H  . famotidine  20 mg Per Tube BID  . guaiFENesin-dextromethorphan  15 mL Per Tube Q6H  . insulin aspart  0-20 Units Subcutaneous Q4H  . mouth rinse  15 mL Mouth Rinse 10 times per day  . multivitamin  15 mL Per Tube Daily  . nutrition supplement (JUVEN)  1 packet Per Tube BID BM  . QUEtiapine  100 mg Per Tube QHS  . sodium chloride flush  10-40 mL Intracatheter Q12H   Continuous Infusions: . sodium chloride Stopped (05/29/19 1741)  . amiodarone 60 mg/hr (05/30/19 1000)   Followed by  . amiodarone 30 mg/hr (05/30/19 1056)  . ceFEPime (MAXIPIME) IV Stopped (05/30/19 [id number])  . HYDROmorphone 5 mg/hr (05/30/19 1000)  . midazolam 0.5 mg/hr (05/30/19 1005)  . phenylephrine (NEO-SYNEPHRINE) Adult infusion 25 mcg/min  (05/30/19 1000)     LOS: 16 days   Time spent: 35 minutes.  Tyrone Nineyan B Thomasa Heidler, MD [facility] [email or web address] Password [email or web address] 05/30/2019, 10:57 AM

## 2019-05-31 ENCOUNTER — Inpatient Hospital Stay (HOSPITAL_COMMUNITY): Payer: BLUE CROSS/BLUE SHIELD

## 2019-05-31 ENCOUNTER — Other Ambulatory Visit: Payer: Self-pay

## 2019-05-31 DIAGNOSIS — J690 Pneumonitis due to inhalation of food and vomit: Secondary | ICD-10-CM

## 2019-05-31 DIAGNOSIS — E872 Acidosis: Secondary | ICD-10-CM

## 2019-05-31 LAB — POCT I-STAT 7, (LYTES, BLD GAS, ICA,H+H)
Acid-Base Excess: 6 mmol/L — ABNORMAL HIGH (ref 0.0–2.0)
Acid-base deficit: 1 mmol/L (ref 0.0–2.0)
Acid-base deficit: 6 mmol/L — ABNORMAL HIGH (ref 0.0–2.0)
Bicarbonate: 25.5 mmol/L (ref 20.0–28.0)
Bicarbonate: 27.7 mmol/L (ref 20.0–28.0)
Bicarbonate: 31.6 mmol/L — ABNORMAL HIGH (ref 20.0–28.0)
Calcium, Ion: 0.99 mmol/L — ABNORMAL LOW (ref 1.15–1.40)
Calcium, Ion: 1.02 mmol/L — ABNORMAL LOW (ref 1.15–1.40)
Calcium, Ion: 1.03 mmol/L — ABNORMAL LOW (ref 1.15–1.40)
HCT: 30 % — ABNORMAL LOW (ref 39.0–52.0)
HCT: 30 % — ABNORMAL LOW (ref 39.0–52.0)
HCT: 32 % — ABNORMAL LOW (ref 39.0–52.0)
Hemoglobin: 10.2 g/dL — ABNORMAL LOW (ref 13.0–17.0)
Hemoglobin: 10.2 g/dL — ABNORMAL LOW (ref 13.0–17.0)
Hemoglobin: 10.9 g/dL — ABNORMAL LOW (ref 13.0–17.0)
O2 Saturation: 70 %
O2 Saturation: 80 %
O2 Saturation: 94 %
Patient temperature: 98.8
Patient temperature: 99.2
Patient temperature: 99.4
Potassium: 3.4 mmol/L — ABNORMAL LOW (ref 3.5–5.1)
Potassium: 3.6 mmol/L (ref 3.5–5.1)
Potassium: 3.9 mmol/L (ref 3.5–5.1)
Sodium: 152 mmol/L — ABNORMAL HIGH (ref 135–145)
Sodium: 152 mmol/L — ABNORMAL HIGH (ref 135–145)
Sodium: 153 mmol/L — ABNORMAL HIGH (ref 135–145)
TCO2: 28 mmol/L (ref 22–32)
TCO2: 30 mmol/L (ref 22–32)
TCO2: 33 mmol/L — ABNORMAL HIGH (ref 22–32)
pCO2 arterial: 49 mmHg — ABNORMAL HIGH (ref 32.0–48.0)
pCO2 arterial: 71.7 mmHg (ref 32.0–48.0)
pCO2 arterial: 92.1 mmHg (ref 32.0–48.0)
pH, Arterial: 7.051 — CL (ref 7.350–7.450)
pH, Arterial: 7.197 — CL (ref 7.350–7.450)
pH, Arterial: 7.419 (ref 7.350–7.450)
pO2, Arterial: 54 mmHg — ABNORMAL LOW (ref 83.0–108.0)
pO2, Arterial: 57 mmHg — ABNORMAL LOW (ref 83.0–108.0)
pO2, Arterial: 74 mmHg — ABNORMAL LOW (ref 83.0–108.0)

## 2019-05-31 LAB — COMPREHENSIVE METABOLIC PANEL
ALT: 162 U/L — ABNORMAL HIGH (ref 0–44)
ALT: 166 U/L — ABNORMAL HIGH (ref 0–44)
AST: 205 U/L — ABNORMAL HIGH (ref 15–41)
AST: 101 U/L — ABNORMAL HIGH (ref 15–41)
Albumin: 1.3 g/dL — ABNORMAL LOW (ref 3.5–5.0)
Albumin: 1.9 g/dL — ABNORMAL LOW (ref 3.5–5.0)
Alkaline Phosphatase: 198 U/L — ABNORMAL HIGH (ref 38–126)
Alkaline Phosphatase: 243 U/L — ABNORMAL HIGH (ref 38–126)
Anion gap: 11 (ref 5–15)
Anion gap: 6 (ref 5–15)
BUN: 36 mg/dL — ABNORMAL HIGH (ref 8–23)
BUN: 34 mg/dL — ABNORMAL HIGH (ref 8–23)
CO2: 28 mmol/L (ref 22–32)
CO2: 34 mmol/L — ABNORMAL HIGH (ref 22–32)
Calcium: 4.3 mg/dL — CL (ref 8.9–10.3)
Calcium: 6.6 mg/dL — ABNORMAL LOW (ref 8.9–10.3)
Chloride: 104 mmol/L (ref 98–111)
Chloride: 113 mmol/L — ABNORMAL HIGH (ref 98–111)
Creatinine, Ser: 0.81 mg/dL (ref 0.61–1.24)
Creatinine, Ser: 0.88 mg/dL (ref 0.61–1.24)
GFR calc Af Amer: >60 mL/min (ref >60)
GFR calc Af Amer: >60 mL/min (ref >60)
GFR calc non Af Amer: >60 mL/min (ref >60)
GFR calc non Af Amer: >60 mL/min (ref >60)
Glucose, Bld: 417 mg/dL — ABNORMAL HIGH (ref 70–99)
Glucose, Bld: 79 mg/dL (ref 70–99)
Potassium: 2.5 mmol/L — CL (ref 3.5–5.1)
Potassium: 3.1 mmol/L — ABNORMAL LOW (ref 3.5–5.1)
Sodium: 143 mmol/L (ref 135–145)
Sodium: 153 mmol/L — ABNORMAL HIGH (ref 135–145)
Total Bilirubin: 0.3 mg/dL (ref 0.3–1.2)
Total Bilirubin: 0.3 mg/dL (ref 0.3–1.2)
Total Protein: 3.3 g/dL — ABNORMAL LOW (ref 6.5–8.1)
Total Protein: 5.1 g/dL — ABNORMAL LOW (ref 6.5–8.1)

## 2019-05-31 LAB — CBC
HCT: 30.4 % — ABNORMAL LOW (ref 39.0–52.0)
HCT: 35.7 % — ABNORMAL LOW (ref 39.0–52.0)
Hemoglobin: 9.1 g/dL — ABNORMAL LOW (ref 13.0–17.0)
Hemoglobin: 11 g/dL — ABNORMAL LOW (ref 13.0–17.0)
MCH: 32 pg (ref 26.0–34.0)
MCH: 32.2 pg (ref 26.0–34.0)
MCHC: 29.9 g/dL — ABNORMAL LOW (ref 30.0–36.0)
MCHC: 30.8 g/dL (ref 30.0–36.0)
MCV: 107 fL — ABNORMAL HIGH (ref 80.0–100.0)
MCV: 104.4 fL — ABNORMAL HIGH (ref 80.0–100.0)
Platelets: 133 10*3/uL — ABNORMAL LOW (ref 150–400)
Platelets: 186 10*3/uL (ref 150–400)
RBC: 2.84 MIL/uL — ABNORMAL LOW (ref 4.22–5.81)
RBC: 3.42 MIL/uL — ABNORMAL LOW (ref 4.22–5.81)
RDW: 15.7 % — ABNORMAL HIGH (ref 11.5–15.5)
RDW: 15.9 % — ABNORMAL HIGH (ref 11.5–15.5)
WBC: 14.3 10*3/uL — ABNORMAL HIGH (ref 4.0–10.5)
WBC: 19.6 10*3/uL — ABNORMAL HIGH (ref 4.0–10.5)
nRBC: 0.2 % (ref 0.0–0.2)
nRBC: 0.1 % (ref 0.0–0.2)

## 2019-05-31 LAB — CULTURE, BLOOD (ROUTINE X 2)
Culture: NO GROWTH
Culture: NO GROWTH
Special Requests: ADEQUATE
Special Requests: ADEQUATE

## 2019-05-31 LAB — BASIC METABOLIC PANEL
Anion gap: 10 (ref 5–15)
BUN: 43 mg/dL — ABNORMAL HIGH (ref 8–23)
CO2: 25 mmol/L (ref 22–32)
Calcium: 5.5 mg/dL — CL (ref 8.9–10.3)
Chloride: 118 mmol/L — ABNORMAL HIGH (ref 98–111)
Creatinine, Ser: 1.01 mg/dL (ref 0.61–1.24)
GFR calc Af Amer: >60 mL/min (ref >60)
GFR calc non Af Amer: >60 mL/min (ref >60)
Glucose, Bld: 294 mg/dL — ABNORMAL HIGH (ref 70–99)
Potassium: 3 mmol/L — ABNORMAL LOW (ref 3.5–5.1)
Sodium: 153 mmol/L — ABNORMAL HIGH (ref 135–145)

## 2019-05-31 LAB — GLUCOSE, CAPILLARY
Glucose-Capillary: 266 mg/dL — ABNORMAL HIGH (ref 70–99)
Glucose-Capillary: 280 mg/dL — ABNORMAL HIGH (ref 70–99)
Glucose-Capillary: 254 mg/dL — ABNORMAL HIGH (ref 70–99)
Glucose-Capillary: 208 mg/dL — ABNORMAL HIGH (ref 70–99)
Glucose-Capillary: 127 mg/dL — ABNORMAL HIGH (ref 70–99)
Glucose-Capillary: 91 mg/dL (ref 70–99)

## 2019-05-31 LAB — PHOSPHORUS: Phosphorus: 1 mg/dL — CL (ref 2.5–4.6)

## 2019-05-31 LAB — LACTIC ACID, PLASMA
Lactic Acid, Venous: 4 mmol/L (ref 0.5–1.9)
Lactic Acid, Venous: 2.6 mmol/L (ref 0.5–1.9)

## 2019-05-31 LAB — TROPONIN I
Troponin I: 0.05 ng/mL (ref <0.03)
Troponin I: 0.05 ng/mL (ref <0.03)
Troponin I: 0.05 ng/mL (ref <0.03)

## 2019-05-31 LAB — MAGNESIUM
Magnesium: 1.9 mg/dL (ref 1.7–2.4)
Magnesium: 2.4 mg/dL (ref 1.7–2.4)

## 2019-05-31 MED ORDER — PIPERACILLIN-TAZOBACTAM 3.375 G IVPB
3.3750 g | Freq: Three times a day (TID) | INTRAVENOUS | Status: DC
  Administered 2019-05-31 – 2019-06-05 (×15): 3.375 g via INTRAVENOUS
  Filled 2019-05-31 (×17): qty 50

## 2019-05-31 MED ORDER — SODIUM CHLORIDE 0.9 % IV BOLUS
1000.0000 mL | Freq: Once | INTRAVENOUS | Status: AC
  Administered 2019-05-31: 1000 mL via INTRAVENOUS

## 2019-05-31 MED ORDER — SODIUM BICARBONATE 8.4 % IV SOLN
100.0000 meq | Freq: Once | INTRAVENOUS | Status: AC
  Administered 2019-05-31: 01:00:00 100 meq via INTRAVENOUS

## 2019-05-31 MED ORDER — EPINEPHRINE PF 1 MG/ML IJ SOLN
0.5000 ug/min | INTRAVENOUS | Status: DC
  Filled 2019-05-31: qty 8

## 2019-05-31 MED ORDER — PHENYLEPHRINE HCL-NACL 10-0.9 MG/250ML-% IV SOLN
INTRAVENOUS | Status: AC
  Filled 2019-05-31: qty 250

## 2019-05-31 MED ORDER — VECURONIUM BROMIDE 10 MG IV SOLR
10.0000 mg | Freq: Once | INTRAVENOUS | Status: AC
  Administered 2019-05-31: 10:00:00 10 mg via INTRAVENOUS
  Filled 2019-05-31: qty 10

## 2019-05-31 MED ORDER — MAGNESIUM SULFATE 2 GM/50ML IV SOLN
2.0000 g | Freq: Once | INTRAVENOUS | Status: AC
  Administered 2019-05-31: 2 g via INTRAVENOUS
  Filled 2019-05-31: qty 50

## 2019-05-31 MED ORDER — POTASSIUM PHOSPHATES 15 MMOLE/5ML IV SOLN
30.0000 mmol | Freq: Once | INTRAVENOUS | Status: AC
  Administered 2019-06-01: 30 mmol via INTRAVENOUS
  Filled 2019-05-31: qty 10

## 2019-05-31 MED ORDER — NOREPINEPHRINE 4 MG/250ML-% IV SOLN: 0.0000 ug/min | INTRAVENOUS | Status: DC

## 2019-05-31 MED ORDER — SODIUM BICARBONATE 8.4 % IV SOLN
INTRAVENOUS | Status: AC
  Administered 2019-05-31: 50 meq
  Filled 2019-05-31: qty 150

## 2019-05-31 MED ORDER — EPINEPHRINE PF 1 MG/ML IJ SOLN
0.5000 ug/min | INTRAVENOUS | Status: DC
  Administered 2019-05-31: 15 ug/min via INTRAVENOUS
  Filled 2019-05-31 (×2): qty 4

## 2019-05-31 MED ORDER — PHENYLEPHRINE HCL-NACL 40-0.9 MG/250ML-% IV SOLN
0.0000 ug/min | INTRAVENOUS | Status: DC
  Administered 2019-05-31 (×2): 400 ug/min via INTRAVENOUS
  Administered 2019-05-31: 220 ug/min via INTRAVENOUS
  Administered 2019-05-31: 400 ug/min via INTRAVENOUS
  Administered 2019-05-31: 380 ug/min via INTRAVENOUS
  Filled 2019-05-31 (×4): qty 250

## 2019-05-31 MED ORDER — SODIUM CHLORIDE 0.9 % IV SOLN
0.0000 ug/min | INTRAVENOUS | Status: DC
  Filled 2019-05-31: qty 1

## 2019-05-31 MED ORDER — PHENYLEPHRINE HCL-NACL 10-0.9 MG/250ML-% IV SOLN
INTRAVENOUS | Status: AC
  Administered 2019-05-31: 10 mg
  Filled 2019-05-31: qty 250

## 2019-05-31 MED ORDER — SODIUM CHLORIDE 0.9 % IV BOLUS
500.0000 mL | Freq: Once | INTRAVENOUS | Status: AC
  Administered 2019-05-31: 11:00:00 500 mL via INTRAVENOUS

## 2019-05-31 MED ORDER — NOREPINEPHRINE 4 MG/250ML-% IV SOLN
INTRAVENOUS | Status: AC
  Filled 2019-05-31: qty 250

## 2019-05-31 MED ORDER — CALCIUM GLUCONATE-NACL 2-0.675 GM/100ML-% IV SOLN
2.0000 g | Freq: Once | INTRAVENOUS | Status: AC
  Administered 2019-05-31: 05:00:00 2000 mg via INTRAVENOUS
  Filled 2019-05-31: qty 100

## 2019-05-31 MED ORDER — POTASSIUM CHLORIDE 10 MEQ/100ML IV SOLN
10.0000 meq | INTRAVENOUS | Status: AC
  Administered 2019-06-01 (×4): 10 meq via INTRAVENOUS
  Filled 2019-05-31 (×4): qty 100

## 2019-05-31 MED ORDER — INSULIN GLARGINE 100 UNIT/ML ~~LOC~~ SOLN
5.0000 [IU] | Freq: Every day | SUBCUTANEOUS | Status: DC
  Administered 2019-05-31 – 2019-06-07 (×8): 5 [IU] via SUBCUTANEOUS
  Filled 2019-05-31 (×9): qty 0.05

## 2019-05-31 MED ORDER — DEXTROSE 50 % IV SOLN
INTRAVENOUS | Status: AC
  Administered 2019-05-31: 50 mL
  Filled 2019-05-31: qty 50

## 2019-05-31 MED ORDER — NOREPINEPHRINE 16 MG/250ML-% IV SOLN
0.0000 ug/min | INTRAVENOUS | Status: DC
  Administered 2019-05-31 (×2): 40 ug/min via INTRAVENOUS
  Filled 2019-05-31 (×3): qty 250

## 2019-05-31 MED ORDER — POTASSIUM CHLORIDE 10 MEQ/50ML IV SOLN
10.0000 meq | INTRAVENOUS | Status: AC
  Administered 2019-05-31 (×4): 10 meq via INTRAVENOUS
  Filled 2019-05-31 (×4): qty 50

## 2019-05-31 NOTE — Progress Notes (Signed)
Pharmacy Antibiotic Note  Lance Mason is a 63 y.o. male admitted on 05/01/2019. Patient is on D#6 of Cefepime with worsening leukocytosis with continued fever spikes. Now switching to Zosyn to cover aspiration pneumonia   Plan:  Zosyn 3.375 gm IV Q 8 hours   Monitor clinical progress     Height: 5\' 5"  (165.1 cm) Weight: (bed scale incorrect, can't get accurate weight) IBW/kg (Calculated) : 61.5  Temp (24hrs), Avg:100.5 F (38.1 C), Min:98.6 F (37 C), Max:102.2 F (39 C)  Recent Labs  Lab 05/26/19 1140 05/26/19 1613  05/27/19 1435  05/29/19 0215 05/30/19 0430 05/30/19 0815 05/30/19 0839 05/31/19 0006 05/31/19 0315 05/31/19 0705  WBC  --   --    < >  --    < > 8.8 QUESTIONABLE RESULTS, RECOMMEND RECOLLECT TO VERIFY  --  9.1 14.3* 19.6*  --   CREATININE  --   --    < >  --    < > 0.43* QUESTIONABLE RESULTS, RECOMMEND RECOLLECT TO VERIFY 0.52*  --  1.01 0.81  --   LATICACIDVEN 2.7* 3.0*  --  2.4*  --   --   --   --   --   --   --  4.0*   < > = values in this interval not displayed.    Estimated Creatinine Clearance: 104.4 mL/min (by C-G formula based on SCr of 0.81 mg/dL).    No Known Allergies  Antimicrobials this admission: 5/26 cefepime >>6/2, resumed 6/7 >>  5/26 zmax >> 6/2 5/26 vancomycin >>5/28, resumed 6/7 >> 6/10 5/27 Actemra  5/27 Remdesivir >> 5/31 5/29 Convalescent plasma  6/2 Fluconazole >> (6/8)   Microbiology results: 5/26 BCx2L: negF 5/20 COVID positive  5/27 MRSA PCR: neg  5/31 TA: rare GPC 5/31 BCx: NGF 6/7 TA: normal flora 6/7 MRSA PCR:  6/7 BCx: negF  Thank you for allowing pharmacy to be a part of this patient's care.  Albertina Parr, PharmD., BCPS Clinical Pharmacist Clinical phone for 05/31/19 until 5pm: [phone]

## 2019-05-31 NOTE — Progress Notes (Signed)
CRITICAL VALUE ALERT  Critical Value:  Phosphorus 1.0  Date & Time Notied:  06/12 @ 2306  Provider Notified: Sarajane Jews  Orders Received/Actions taken: awaiting new orders

## 2019-05-31 NOTE — Progress Notes (Signed)
Afternoon nursing assessment completed and remains as previously charted.

## 2019-05-31 NOTE — Progress Notes (Signed)
I spoke with the patient's niece and primary contact violeta and helped set up video chat with the patient.  Questions answered and concerns addressed.

## 2019-05-31 NOTE — Progress Notes (Signed)
NAME:  Lance Mason, MRN:  [id number], DOB:  [birth year], LOS: 17 ADMISSION DATE:  05/08/2019, CONSULTATION DATE: May 15, 2019 REFERRING MD: Dr. Kerry HoughMemon, CHIEF COMPLAINT: Dyspnea  Brief History   63 year old male with diabetes type 2, hypertension admitted on May 26 with acute respiratory failure with hypoxemia due to COVID-19 pneumonia.  Past Medical History  Hypertension Hyperlipidemia DM2  Significant Hospital Events   May 26 admission May 29 intubation, mechanical ventilation in setting of profound encephalopathy May 31 worsening dyssynchrony, hypoxemia, added vecuronium June 1 chemical paralysis used overnight  June 2-5 weaning FiO2/PEEP June 6 weaning well on pressure support June 7 worsening oxygenation/tachycardia fever, started on antibiotics again June 9 following commands, weaning, extubated June 10 vomited, hypoxemic, re-intubated  Consults:  Pulmonary and critical care medicine  Procedures:  Endotracheal tube May 29 > June 9 L subclavian CVL May 29 >  Endotracheal tube June 10 >   Significant Diagnostic Tests:  CT angiogram chest May 27 diffuse bilateral patchy airspace disease groundglass upper lobes predominant, no pulmonary embolism June 1 bilateral lower ext doppler > neg DVT June 1 TTE >>>LVEF > 65%, RV normal but poorly visualized CT head 6/7 > NAICP CT chest 6/8 > No PE, findings consistent with viral pneumonia, increased RLL consolidation, reactive subcarinal lymph node  Micro Data:  May 21 SARS-CoV-2 positive May 26 blood cultures June 7 resp culture > GPC, GNR, GPR > OPF June 7 blood culture >   Antimicrobials/COVID treatment  May 26 azithromycin > June 2 May 27 cefepime > June 2 May 27 vancomycin > May 31 June 2 Fluconazole >  May 27 remdesivir May 27 Tocilizumab x1 5/27 May 29 Convalescent plasma  June 7 Vanc > 6/9  June 7 Cefepime > 6/9, restart 6/10 June 10 Flagyl > Interim history/subjective:  Proned yesterday for low  Pao2 Bradycardia arrest today AM with CPR for 6 mins Now on epi and bicarb drips  Objective   Blood pressure (!) 154/57, pulse (!) 111, temperature 98.6 F (37 C), temperature source Axillary, resp. rate (!) 3, height 5\' 5"  (1.651 m), weight 105.6 kg, SpO2 100 %.    Vent Mode: PRVC FiO2 (%):  [80 %-100 %] 100 % Set Rate:  [35 bmp] 35 bmp Vt Set:  [370 mL-450 mL] 450 mL PEEP:  [16 cmH20] 16 cmH20 Plateau Pressure:  [36 cmH20-40 cmH20] 36 cmH20   Intake/Output Summary (Last 24 hours) at 05/31/2019 0830 Last data filed at 05/31/2019 0710 Gross per 24 hour  Intake 5745.79 ml  Output 1120 ml  Net 4625.79 ml   Filed Weights    Examination: Gen:      No acute distress HEENT:  EOMI, sclera anicteric Neck:     No masses; no thyromegaly, ETT Lungs:    Clear to auscultation bilaterally; normal respiratory effort CV:         Regular rate and rhythm; no murmurs Abd:      + bowel sounds; soft, non-tender; no palpable masses, no distension Ext:    Cool extremities. No edema; adequate peripheral perfusion Skin:      Warm and dry; no rash Neuro: Sedated, unresponsive  Resolved Hospital Problem list     Assessment & Plan:  ARDS due to COVID pneumonia: Oxygenation worse June 7, concern for healthcare associated pneumonia versus worsening COVID ARDS net ventilation Sedate to prevent dysunchrony with vent One dose vecuronium. Follow ABG. Will stop proning as he is too unstable Repeat CXR to check ETT placement. Has  a cuff leak  Cardiac arrest Likely due to hypoxemia and acidosis Wean down pressors Continue bicarb Follow ABG  Concern for healthcare associated pneumonia on May 26, 2019: no evidence, most likely COVID, RLL consolidation Continue cefepime, flagyl Follow cultures  A fib. Now in NSR Holding amio due to bradycardia.  Acute encephalopathy, ICU delirium Continue quetiapine Continue prn haldol Monitor QTc  Goals of care Very poor prognosis. Family wants full code  Palliative consult  Best practice:  Diet: Tube feeding Pain/Anxiety/Delirium protocol (if indicated): Dilaudid, versed gtt. Rass goal -2 VAP protocol (if indicated): yes DVT prophylaxis: lovenox bid per COVID protocol GI prophylaxis: famotidine Glucose control: SSI Mobility: passive range of motion in bed Code Status: full Family Communication: Family updated 6/12. Pending today Disposition: remain in ICU  The patient is critically ill with multiple organ system failure and requires high complexity decision making for assessment and support, frequent evaluation and titration of therapies, advanced monitoring, review of radiographic studies and interpretation of complex data.   Critical Care Time devoted to patient care services, exclusive of separately billable procedures, described in this note is 35 minutes.   Marshell Garfinkel MD  Pulmonary and Critical Care Pager [phone] If no answer call [phone] 05/31/2019, 8:34 AM

## 2019-05-31 NOTE — Progress Notes (Signed)
Lance Mason (patient's niece) called to update family on patient's condition. At this time explained that patient is critical on multiple medications to keep blood pressure WNL. Violeta conferenced in other family members to allow time to ask questions. All questions answered. Family thankful for the update. Patient is still a FULL CODE at this time.

## 2019-05-31 NOTE — Progress Notes (Signed)
eLink Physician-Brief Progress Note Patient Name: Lance Mason DOB: 12/07/1956 MRN: [id number]   Date of Service  05/31/2019  HPI/Events of Note  Called to camera in room due to bradycardia and hypotension.  Patient never lost pulse but required several rounds of epi, bicarb pushes with improvement in HR and BP.  Bilateral breath sounds. But sats remain poor in the 50s.  TRH has called family and updated them but the currently wish for the patient to remain a full code.  eICU Interventions  Plan: Epi and Bicarb gtts ABG/BMET/CBC pCXR     Intervention Category Major Interventions: Code management / supervision  [place] 05/31/2019, 12:02 AM

## 2019-05-31 NOTE — Progress Notes (Signed)
Assisted family with video chat via elink.  

## 2019-05-31 NOTE — Progress Notes (Signed)
Code Note:  Patient BP started to decrease. Neo turned on at 6063 due to Systolic 01'S  1x dose of Vec given at 2308 for vent desynchrony Patient remained desynchronous after vec push. Versed bolus given. Dilaudid bolus given Neo rate increased  Pt HR decreased from 140's to 70's. Charge nurse informed. BP remained low 80's.   HR decreased to 40's. Code pads placed on patient. Code cart brought into room. Help called.   Atropine given at 2330 Epi pushed at 2331. HR remains 40's. Patient turned supine from prone position with help from charge RN, other RN, and respiratory  Patient lost pulse at 2335, Asystole on monitor. Chest compressions started. Epi given at 2336  Pulse check at 2338, faint pulse Epi given at 2339. Bicarb push given at 2341. NSR on monitor. Pt began to brady again (30's) Epi given at 2344, Bicarb at 2345 and 2347. HR now in 60's. Another Epi given at 2347. BP 01'U systolic. Levo started. Neo maxed. Levo maxed. 1L bolus infusing Another epi given at 2352 Another bicarb at [place] and bicarb gtt ordered.  Another push of epi at 2359. Bicarb push at 0005. Pt NSR.  Epi gtt started. Bicarb gtt started. ST on monitor. O2 70-80's  Current Gtts: Epi Levo Neo Bicarb

## 2019-05-31 NOTE — Progress Notes (Signed)
PROGRESS NOTE  Lance Mason  NWG:[id number] DOB: 1956/11/28 DOA: 04/25/2019 PCP: Patient, No Pcp Per   Brief Narrative: Lance Mason is a 63 y.o. male with a history of T2DM, HTN, obesity who presented with cough and shortness of breath and fever on 5/26, found to be hypoxic requiring NRB. CXR demonstrated bilateral opacities and [facility], ultimately intubated.  Patient was able to be weaned off and extubated, but then ended up being reintubated on 6/10.  Overnight, he developed bradycardia that went into asystole briefly and was resuscitated with brief CPR treated with epinephrine and bicarbonate.  This morning, lactic acid level trended upward, currently at 4.0.  CBGs elevated in the 400s.  Antibiotics changed to better cover aspiration  Assessment & Plan: Principal Problem:   Acute respiratory failure with hypoxemia (HCC) Active Problems:   Type 2 diabetes mellitus without complication, without long-term current use of insulin (HCC)   Pure hypercholesterolemia   Pneumonia due to COVID-19 virus   CAP (community acquired pneumonia)   Abnormal liver function   Pressure injury of skin  Acute hypoxic respiratory failure due to covid-19 pneumonia and suspected aspiration pneumonia: Reintubated 6/10, febrile overnight. - s/p actemra 5/27, convalescent plasma 5/29 - s/p remdesivir 5/27 - 5/31.  - Continue airborne, contact precautions. PPE including surgical gown, gloves, face shield, cap, shoe covers, and N-95 used during this encounter in a negative pressure room.  - Check daily labs: CBC w/diff, CMP, d-dimer, fibrinogen, ferritin, LDH, CRP - Avoid NSAIDs - s/p actemra 5/27, convalescent plasma 5/29, remdesivir 5/27 - 5/31. - s/p abx x7 days.  Antibiotics changed to Zosyn as of 6/12 for aspiration coverage  Oral candidiasis: - Has been given fluconazole x7 days.  T2DM: HbA1c 9.9%.  - Hold home metformin, glipizide - Continue SSI,  will restart tube feeds at trickle. With large jump in CBGs, have started very low-dose Lantus  Hypotension: BPs trending down, worse with sedation.  - Neo gtt, wean as able - DC old subclavian line, insert PICC  AFib with RVR:  - Amiodarone gtt.   Obesity: BMI: 38. Contributes to poor prognosis - Optimize nutritional status per dietitian  Hypernatremia: Resolved.  Seizure-like activity Nursing noted a few minutes seizure like activity (clonic jerking of both arms, left leg) that were self-limited in a.m. 6/7, in context of tachycardia.  Patient subsequently agitated and moving all extremities.  No focal weakness.   Also, no further posturing, no further seizure like activity today.  Doubt status. CT head 6/7 unremarkable.  No further jerking.  Suspect this was agitation, not seizure.  DVT prophylaxis: Lovenox Code Status: Full Family Communication: Left message for granddaughter Disposition Plan: Remain in ICU, remains quite critically ill  Consultants:   PCCM  Procedures:   5/29 ETT  5/29 - 6/10 L subclavian central line  6/10 PICC  Antimicrobials:  Azithromycin 5/26 >> 6/2  Cefepime 5/27 >> 6/2 and 6/7 >> 6/12  Fluconazole 6/2 >> 6/9  Remdesivir 5/27 >> 5/31  Vancomycin 5/26 >> 5/28  And 6/7 >>  Metronidazole 6/11  IV Zosyn 6/12-present   Objective: Vitals:   05/31/19 1045 05/31/19 1047 05/31/19 1146 05/31/19 1540  BP: (!) 118/56 (!) 118/56 (!) 124/57 137/60  Pulse: 87 68 83 74  Resp: (!) 0 (!) 0 (!) 35 (!) 35  Temp:      TempSrc:      SpO2: 100% 100% 100% 98%  Height:  Intake/Output Summary (Last 24 hours) at 05/31/2019 1605 Last data filed at 05/31/2019 1400 Gross per 24 hour  Intake 6706.68 ml  Output 745 ml  Net 5961.68 ml   Filed Weights   Gen: 63 y.o. male in no distress HEENT: Intubated Pulm: Coarse bilateral breath sounds, tachypneic CV: Regular rate and rhythm, occasional ectopic beat GI: Abdomen soft, non-distended, with  normoactive bowel sounds.  Ext: Warm, no deformities Skin: No rashes, lesions or ulcers on visualized skin. Neuro: Sedated Psych: Sedated, no acute agitation  Data Reviewed: I have personally reviewed following labs and imaging studies  CBC: Recent Labs  Lab 05/29/19 0215  05/30/19 0430 05/30/19 0839  05/31/19 0006 05/31/19 0045 05/31/19 0200 05/31/19 0315 05/31/19 1159  WBC 8.8  --  QUESTIONABLE RESULTS, RECOMMEND RECOLLECT TO VERIFY 9.1  --  14.3*  --   --  19.6*  --   HGB 12.5*   < > QUESTIONABLE RESULTS, RECOMMEND RECOLLECT TO VERIFY 11.6*   < > 9.1* 10.2* 10.2* 11.0* 10.9*  HCT 39.7   < > QUESTIONABLE RESULTS, RECOMMEND RECOLLECT TO VERIFY 37.0*   < > 30.4* 30.0* 30.0* 35.7* 32.0*  MCV 98.5  --  QUESTIONABLE RESULTS, RECOMMEND RECOLLECT TO VERIFY 100.3*  --  107.0*  --   --  104.4*  --   PLT 168  --  QUESTIONABLE RESULTS, RECOMMEND RECOLLECT TO VERIFY 146*  --  133*  --   --  186  --    < > = values in this interval not displayed.   Basic Metabolic Panel: Recent Labs  Lab 05/29/19 0215  05/30/19 0430 05/30/19 0815  05/31/19 0006 05/31/19 0045 05/31/19 0200 05/31/19 0315 05/31/19 1159  NA 143   < > QUESTIONABLE RESULTS, RECOMMEND RECOLLECT TO VERIFY 145   < > 153* 152* 153* 143 152*  K 3.6   < > QUESTIONABLE RESULTS, RECOMMEND RECOLLECT TO VERIFY 3.5   < > 3.0* 3.9 3.6 2.5* 3.4*  CL 106  --  QUESTIONABLE RESULTS, RECOMMEND RECOLLECT TO VERIFY 114*  --  118*  --   --  104  --   CO2 26  --  QUESTIONABLE RESULTS, RECOMMEND RECOLLECT TO VERIFY 22  --  25  --   --  28  --   GLUCOSE 81  --  QUESTIONABLE RESULTS, RECOMMEND RECOLLECT TO VERIFY 91  --  294*  --   --  417*  --   BUN 17  --  QUESTIONABLE RESULTS, RECOMMEND RECOLLECT TO VERIFY 16  --  43*  --   --  36*  --   CREATININE 0.43*  --  QUESTIONABLE RESULTS, RECOMMEND RECOLLECT TO VERIFY 0.52*  --  1.01  --   --  0.81  --   CALCIUM 8.0*  --  QUESTIONABLE RESULTS, RECOMMEND RECOLLECT TO VERIFY 6.6*  --  5.5*  --   --   4.3*  --   MG  --   --  QUESTIONABLE RESULTS, RECOMMEND RECOLLECT TO VERIFY 2.1  --  1.9  --   --   --   --    < > = values in this interval not displayed.   GFR: Estimated Creatinine Clearance: 104.4 mL/min (by C-G formula based on SCr of 0.81 mg/dL). Liver Function Tests: Recent Labs  Lab 05/28/19 0427 05/29/19 0215 05/30/19 0430 05/30/19 0815 05/31/19 0315  AST 69* 117* QUESTIONABLE RESULTS, RECOMMEND RECOLLECT TO VERIFY 58* 205*  ALT 117* 154* QUESTIONABLE RESULTS, RECOMMEND RECOLLECT TO VERIFY 82* 162*  ALKPHOS 121 203* QUESTIONABLE RESULTS, RECOMMEND RECOLLECT TO VERIFY 180* 198*  BILITOT 0.3 0.9 QUESTIONABLE RESULTS, RECOMMEND RECOLLECT TO VERIFY 0.2* 0.3  PROT 4.8* 5.7* QUESTIONABLE RESULTS, RECOMMEND RECOLLECT TO VERIFY 5.1* 3.3*  ALBUMIN 2.2* 2.6* QUESTIONABLE RESULTS, RECOMMEND RECOLLECT TO VERIFY 2.1* 1.3*   No results for input(s): LIPASE, AMYLASE in the last 168 hours. No results for input(s): AMMONIA in the last 168 hours. Coagulation Profile: No results for input(s): INR, PROTIME in the last 168 hours. Cardiac Enzymes: Recent Labs  Lab 05/27/19 2018 05/31/19 0029 05/31/19 0630  TROPONINI <0.03 0.05* 0.05*   BNP (last 3 results) No results for input(s): PROBNP in the last 8760 hours. HbA1C: No results for input(s): HGBA1C in the last 72 hours. CBG: Recent Labs  Lab 05/30/19 1946 05/30/19 2342 05/31/19 0348 05/31/19 0826 05/31/19 1138  GLUCAP 179* 254* 266* 280* 208*   Lipid Profile: No results for input(s): CHOL, HDL, LDLCALC, TRIG, CHOLHDL, LDLDIRECT in the last 72 hours. Thyroid Function Tests: No results for input(s): TSH, T4TOTAL, FREET4, T3FREE, THYROIDAB in the last 72 hours. Anemia Panel: Recent Labs    05/29/19 0210  FERRITIN 1,107*   Urine analysis:    Component Value Date/Time   COLORURINE YELLOW 05/28/2016 2056   APPEARANCEUR CLEAR 05/28/2016 2056   LABSPEC 1.041 (H) 05/28/2016 2056   PHURINE 5.0 05/28/2016 2056   GLUCOSEU  >1000 (A) 05/28/2016 2056   HGBUR NEGATIVE 05/28/2016 2056   BILIRUBINUR negative 06/19/2018 1601   KETONESUR negative 06/19/2018 1601   KETONESUR 15 (A) 05/28/2016 2056   PROTEINUR negative 06/19/2018 1601   PROTEINUR NEGATIVE 05/28/2016 2056   UROBILINOGEN 0.2 06/19/2018 1601   NITRITE Negative 06/19/2018 1601   NITRITE NEGATIVE 05/28/2016 2056   LEUKOCYTESUR Negative 06/19/2018 1601   Recent Results (from the past 240 hour(s))  Culture, respiratory (non-expectorated)     Status: None   Collection Time: 05/26/19  8:48 AM   Specimen: Tracheal Aspirate; Respiratory  Result Value Ref Range Status   Specimen Description   Final    TRACHEAL ASPIRATE Performed at [facility], [address] [place], [place] [address]    Special Requests   Final    NONE Performed at [facility], [address] [place], [place] [address]    Gram Stain   Final    FEW WBC PRESENT, PREDOMINANTLY PMN MODERATE GRAM POSITIVE COCCI MODERATE GRAM NEGATIVE RODS MODERATE GRAM POSITIVE RODS    Culture   Final    MODERATE Consistent with normal respiratory flora. Performed at [facility], [address], [place], [place] [address]    Report Status 05/28/2019 FINAL  Final  Culture, blood (routine x 2)     Status: None   Collection Time: 05/26/19  8:49 AM   Specimen: Right Antecubital; Blood  Result Value Ref Range Status   Specimen Description   Final    RIGHT ANTECUBITAL Performed at [facility], [address], [place], [place] [address]    Special Requests   Final    BOTTLES DRAWN AEROBIC ONLY Blood Culture adequate volume Performed at [facility], [address], [place], [place] [address]    Culture   Final    NO GROWTH 5 DAYS Performed at [facility], [address] [place], [place] [address]    Report Status 05/31/2019 FINAL  Final  Culture, blood (routine x 2)     Status: None   Collection Time:  05/26/19  8:54 AM   Specimen: BLOOD RIGHT HAND  Result Value Ref Range Status   Specimen Description   Final    BLOOD RIGHT HAND Performed at [place] [address], [place], [place] [address]    Special Requests   Final    BOTTLES DRAWN AEROBIC ONLY Blood Culture adequate volume Performed at [place] [address] [place], [place] [address]    Culture   Final    NO GROWTH 5 DAYS Performed at [facility], [place] [address], [place], [place] [address]    Report Status 05/31/2019 FINAL  Final      Radiology Studies: Dg Abd 1 View  Result Date: 05/31/2019 CLINICAL DATA:  63 year old male with abdominal distension. COVID-19. EXAM: ABDOMEN - 1 VIEW COMPARISON:  05/27/2019 and earlier. FINDINGS: Portable AP supine view at 0225 hours. Enteric tube terminates in the right upper quadrant, side hole likely at the gastric antrum. Paucity of bowel gas. No dilated loops are evident. No pneumoperitoneum is evident on these supine views. Probable urethral catheter in place. No osseous abnormality identified. Coarse and confluent pulmonary opacity at the visible lung bases. IMPRESSION: 1. Enteric tube side hole at the distal stomach. 2. Paucity of bowel gas, no dilated loops are evident. Electronically Signed   By: Genevie Ann M.D.   On: 05/31/2019 02:54   Dg Chest Port 1 View  Result Date: 05/31/2019 CLINICAL DATA:  Coronavirus infection.  Respiratory failure. EXAM: PORTABLE CHEST 1 VIEW COMPARISON:  Earlier same day FINDINGS: Endotracheal tube tip 3 cm above the carina. Orogastric or nasogastric tube enters the abdomen. Right arm PICC tip at the SVC RA junction. Widespread pulmonary infiltrates persist, similar to the previous study. IMPRESSION: Persistent widespread pulmonary infiltrates as seen previously. Lines and tubes well positioned. Electronically Signed   By: Nelson Chimes M.D.   On: 05/31/2019 10:23   Dg Chest Port 1 View  Result Date:  05/31/2019 CLINICAL DATA:  63 year old male with respiratory failure. COVID-19. EXAM: PORTABLE CHEST 1 VIEW COMPARISON:  05/29/2019 and earlier. FINDINGS: Portable AP supine view at 0008 hours. Endotracheal tube tip in good position between the clavicles and carina. Enteric tube courses to the abdomen, tip not included. Left subclavian central line has been removed and there is now a right upper extremity approach PICC line in place, tip at the cavoatrial junction level. Calcified pleural plaques along the diaphragm. Widespread bilateral basilar predominant pulmonary opacity, mildly progressed. Associated increased bibasilar air bronchograms. No superimposed pneumothorax. Visible mediastinal contours are stable. No definite pleural effusion. IMPRESSION: 1. Right upper extremity approach PICC line placed, tip at the cavoatrial junction level. Left subclavian central line removed. 2. ET tube in good position. Enteric tube courses to the abdomen. 3. Mild progression of bilateral basilar predominant pulmonary opacity compatible with COVID-19 pneumonia. Underlying calcified pleural plaques. Electronically Signed   By: Genevie Ann M.D.   On: 05/31/2019 00:55    Scheduled Meds: . chlorhexidine  15 mL Mouth/Throat BID  . Chlorhexidine Gluconate Cloth  6 each Topical Daily  . enoxaparin (LOVENOX) injection  50 mg Subcutaneous Q12H  . famotidine  20 mg Per Tube BID  . feeding supplement (PRO-STAT SUGAR FREE 64)  60 mL Per Tube TID  . guaiFENesin-dextromethorphan  15 mL Per Tube Q6H  . insulin aspart  0-20 Units Subcutaneous Q4H  . mouth rinse  15 mL Mouth Rinse 10 times per day  . multivitamin  15 mL Per Tube Daily  .  nutrition supplement (JUVEN)  1 packet Per Tube BID BM  . QUEtiapine  100 mg Per Tube QHS  . sodium chloride flush  10-40 mL Intracatheter Q12H   Continuous Infusions: . sodium chloride Stopped (05/29/19 1741)  . amiodarone 30 mg/hr (05/30/19 1056)  . epinephrine Stopped (05/31/19 0705)  .  feeding supplement (GLUCERNA 1.2 CAL) Stopped (05/31/19 0130)  . HYDROmorphone 5 mg/hr (05/31/19 0651)  . midazolam 1.5 mg/hr (05/31/19 0745)  . norepinephrine (LEVOPHED) Adult infusion 35 mcg/min (05/31/19 1400)  . phenylephrine Stopped (05/31/19 1046)  . piperacillin-tazobactam (ZOSYN)  IV 12.5 mL/hr at 05/31/19 1400  .  sodium bicarbonate (isotonic) infusion in sterile water 125 mL/hr at 05/31/19 1400     LOS: 17 days   Time spent: I have spent 40 minutes in the care of this critically ill patient including medical decision making, face-to-face examination, discussion of care with critical care and review of patient's labs, records and radiologic studies  Hollice EspySendil K Samera Macy, MD [facility] [email or web address] Password [email or web address] 05/31/2019, 4:05 PM

## 2019-05-31 NOTE — Progress Notes (Signed)
Assisted with video chat via elink.  

## 2019-05-31 NOTE — Progress Notes (Signed)
Violeta Cortex (niece) called to provide update on patient after overnight events. Family was allowed time to ask questions. All questions answered at this time. Will pass along to dayshift RN to provide updates frequently during the day. Also told Violeta to reassure the patient's family that if they needed questions answered to not hesitate to call the hospital. Will continue to closely monitor.

## 2019-05-31 NOTE — Progress Notes (Addendum)
This RN returned from break. CPR in progress on patient. See notes from other RN and MDs regarding code blue. Family notified. Patient is still a full code at this time.   Patient was proned. Returned to supine position during code. Patient now on multiple gtts including neo, levo, epi and bicarb. See MAR for dosage.   Will update family at this point that patient is still critical.  Will continue to closely monitor.

## 2019-05-31 NOTE — Significant Event (Signed)
Called to see patient for Code blue 2338  Per RN patient developed bradycardia to asystole and underwent brief CPR. Treated with epinephrine and bicarbonate with ROSC.  Now on bicarbonate and epi infusions with SpO2 88% and SBP in 80s.  Discussed in detail with main point of contact per RN, Cherlynn June; discussed critical status and may not survive the night. FaceTime with Ms. Jearld Adjutant, multiple family members by request and patient. Discussed with daughter as well. Per Ms. Jearld Adjutant, family wishes to continue with full code status.  Discussed with Dr. Jimmy Footman and appreciate her care.  Murray Hodgkins, MD [facility] [phone]  Time: [phone]

## 2019-05-31 NOTE — Progress Notes (Signed)
CRITICAL VALUE ALERT  Critical Value:  K+ 2.5, Ca 4.3  Date & Time Notied:  0521 05/31/19  Pt currently receiving runs  of K+ as well as calcium gluconate.

## 2019-05-31 NOTE — Progress Notes (Signed)
Elink notified regarding blood gas results.  Also notified of elevated qTC and distended abdomen at this time. Abdomen soft but no bowel sounds heard. KUB ordered. Patient too unstable to be transported to CT at this time.  See new orders regarding electrolyte replacement. Will continue to closely monitor.

## 2019-06-01 DIAGNOSIS — I469 Cardiac arrest, cause unspecified: Secondary | ICD-10-CM

## 2019-06-01 DIAGNOSIS — E870 Hyperosmolality and hypernatremia: Secondary | ICD-10-CM

## 2019-06-01 LAB — COMPREHENSIVE METABOLIC PANEL
ALT: 116 U/L — ABNORMAL HIGH (ref 0–44)
AST: 72 U/L — ABNORMAL HIGH (ref 15–41)
Albumin: 1.5 g/dL — ABNORMAL LOW (ref 3.5–5.0)
Alkaline Phosphatase: 194 U/L — ABNORMAL HIGH (ref 38–126)
Anion gap: 8 (ref 5–15)
BUN: 25 mg/dL — ABNORMAL HIGH (ref 8–23)
CO2: 46 mmol/L — ABNORMAL HIGH (ref 22–32)
Calcium: 5.3 mg/dL — CL (ref 8.9–10.3)
Chloride: 97 mmol/L — ABNORMAL LOW (ref 98–111)
Creatinine, Ser: 0.69 mg/dL (ref 0.61–1.24)
GFR calc Af Amer: >60 mL/min (ref >60)
GFR calc non Af Amer: >60 mL/min (ref >60)
Glucose, Bld: 88 mg/dL (ref 70–99)
Potassium: 2.9 mmol/L — ABNORMAL LOW (ref 3.5–5.1)
Sodium: 151 mmol/L — ABNORMAL HIGH (ref 135–145)
Total Bilirubin: 0.2 mg/dL — ABNORMAL LOW (ref 0.3–1.2)
Total Protein: 4.1 g/dL — ABNORMAL LOW (ref 6.5–8.1)

## 2019-06-01 LAB — POCT I-STAT 7, (LYTES, BLD GAS, ICA,H+H)
Acid-Base Excess: 13 mmol/L — ABNORMAL HIGH (ref 0.0–2.0)
Bicarbonate: 38 mmol/L — ABNORMAL HIGH (ref 20.0–28.0)
Calcium, Ion: 0.99 mmol/L — ABNORMAL LOW (ref 1.15–1.40)
HCT: 30 % — ABNORMAL LOW (ref 39.0–52.0)
Hemoglobin: 10.2 g/dL — ABNORMAL LOW (ref 13.0–17.0)
O2 Saturation: 88 %
Patient temperature: 99.6
Potassium: 3.3 mmol/L — ABNORMAL LOW (ref 3.5–5.1)
Sodium: 151 mmol/L — ABNORMAL HIGH (ref 135–145)
TCO2: 39 mmol/L — ABNORMAL HIGH (ref 22–32)
pCO2 arterial: 48.7 mmHg — ABNORMAL HIGH (ref 32.0–48.0)
pH, Arterial: 7.503 — ABNORMAL HIGH (ref 7.350–7.450)
pO2, Arterial: 52 mmHg — ABNORMAL LOW (ref 83.0–108.0)

## 2019-06-01 LAB — GLUCOSE, CAPILLARY
Glucose-Capillary: 147 mg/dL — ABNORMAL HIGH (ref 70–99)
Glucose-Capillary: 191 mg/dL — ABNORMAL HIGH (ref 70–99)
Glucose-Capillary: 58 mg/dL — ABNORMAL LOW (ref 70–99)
Glucose-Capillary: 63 mg/dL — ABNORMAL LOW (ref 70–99)
Glucose-Capillary: 77 mg/dL (ref 70–99)
Glucose-Capillary: 82 mg/dL (ref 70–99)
Glucose-Capillary: 90 mg/dL (ref 70–99)
Glucose-Capillary: 94 mg/dL (ref 70–99)

## 2019-06-01 LAB — LACTIC ACID, PLASMA: Lactic Acid, Venous: 1.4 mmol/L (ref 0.5–1.9)

## 2019-06-01 LAB — TROPONIN I: Troponin I: 0.06 ng/mL (ref <0.03)

## 2019-06-01 LAB — CBC
HCT: 27.6 % — ABNORMAL LOW (ref 39.0–52.0)
Hemoglobin: 8.7 g/dL — ABNORMAL LOW (ref 13.0–17.0)
MCH: 31.1 pg (ref 26.0–34.0)
MCHC: 31.5 g/dL (ref 30.0–36.0)
MCV: 98.6 fL (ref 80.0–100.0)
Platelets: 127 10*3/uL — ABNORMAL LOW (ref 150–400)
RBC: 2.8 MIL/uL — ABNORMAL LOW (ref 4.22–5.81)
RDW: 15.7 % — ABNORMAL HIGH (ref 11.5–15.5)
WBC: 8 10*3/uL (ref 4.0–10.5)
nRBC: 0 % (ref 0.0–0.2)

## 2019-06-01 LAB — PHOSPHORUS: Phosphorus: 1.3 mg/dL — ABNORMAL LOW (ref 2.5–4.6)

## 2019-06-01 MED ORDER — DILTIAZEM HCL 100 MG IV SOLR
5.0000 mg/h | INTRAVENOUS | Status: DC
  Administered 2019-06-01 – 2019-06-02 (×2): 5 mg/h via INTRAVENOUS
  Filled 2019-06-01 (×4): qty 100

## 2019-06-01 MED ORDER — DILTIAZEM HCL-DEXTROSE 100-5 MG/100ML-% IV SOLN (PREMIX)
5.0000 mg/h | INTRAVENOUS | Status: DC
  Filled 2019-06-01 (×2): qty 100

## 2019-06-01 MED ORDER — POTASSIUM PHOSPHATES 15 MMOLE/5ML IV SOLN
30.0000 mmol | Freq: Once | INTRAVENOUS | Status: AC
  Administered 2019-06-01: 30 mmol via INTRAVENOUS
  Filled 2019-06-01: qty 10

## 2019-06-01 MED ORDER — FUROSEMIDE 10 MG/ML IJ SOLN
40.0000 mg | Freq: Once | INTRAMUSCULAR | Status: AC
  Administered 2019-06-01: 40 mg via INTRAVENOUS
  Filled 2019-06-01: qty 4

## 2019-06-01 MED ORDER — DEXTROSE 50 % IV SOLN
INTRAVENOUS | Status: AC
  Administered 2019-06-01: 50 mL
  Filled 2019-06-01: qty 50

## 2019-06-01 MED ORDER — CALCIUM GLUCONATE-NACL 1-0.675 GM/50ML-% IV SOLN
1.0000 g | Freq: Once | INTRAVENOUS | Status: AC
  Administered 2019-06-01: 09:00:00 1000 mg via INTRAVENOUS
  Filled 2019-06-01: qty 50

## 2019-06-01 MED ORDER — DILTIAZEM HCL 100 MG IV SOLR
5.0000 mg/h | INTRAVENOUS | Status: DC
  Filled 2019-06-01 (×2): qty 100

## 2019-06-01 MED ORDER — SODIUM BICARBONATE 8.4 % IV SOLN
INTRAVENOUS | Status: AC
  Filled 2019-06-01: qty 50

## 2019-06-01 MED ORDER — DILTIAZEM LOAD VIA INFUSION
10.0000 mg | Freq: Once | INTRAVENOUS | Status: AC
  Administered 2019-06-01: 19:00:00 10 mg via INTRAVENOUS
  Filled 2019-06-01: qty 10

## 2019-06-01 MED ORDER — CALCIUM GLUCONATE-NACL 1-0.675 GM/50ML-% IV SOLN
1.0000 g | Freq: Once | INTRAVENOUS | Status: AC
  Administered 2019-06-02: 1000 mg via INTRAVENOUS
  Filled 2019-06-01: qty 50

## 2019-06-01 MED ORDER — EPINEPHRINE 1 MG/10ML IJ SOSY
PREFILLED_SYRINGE | INTRAMUSCULAR | Status: AC
  Filled 2019-06-01: qty 10

## 2019-06-01 NOTE — Progress Notes (Addendum)
PROGRESS NOTE  Lance Mason  AOZ:[id number] DOB: [birth year] DOA: 05/09/2019 PCP: Patient, No Pcp Per   Brief Narrative: Lance Mason is a 63 y.o. male with a history of T2DM, HTN, obesity who presented with cough and shortness of breath and fever on 5/26, found to be hypoxic requiring NRB. CXR demonstrated bilateral opacities and he was [facility] to [facility] for covid-19 pneumonia, ultimately intubated.  Patient was able to be weaned off and extubated, but then ended up being reintubated on 6/10.  On the night of 6/11, he developed bradycardia that went into asystole briefly and was resuscitated with brief CPR treated with epinephrine and bicarbonate.  This morning, lactic acid level trended upward, currently at 4.0.  CBGs elevated in the 400s.  Antibiotics changed to better cover aspiration  Patient continues to do poorly.  ARDS present.  Amiodarone held for bradycardic episodes.  Assessment & Plan: Principal Problem:   Acute respiratory failure with hypoxemia (HCC) Active Problems:   Type 2 diabetes mellitus without complication, without long-term current use of insulin (HCC)   Pure hypercholesterolemia   Pneumonia due to COVID-19 virus   CAP (community acquired pneumonia)   Abnormal liver function   Pressure injury of skin  Acute hypoxic respiratory failure due to covid-19 pneumonia and suspected aspiration pneumonia with ARDS: Reintubated 6/10, febrile overnight. - s/p actemra 5/27, convalescent plasma 5/29 - s/p remdesivir 5/27 - 5/31.  - Continue airborne, contact precautions. PPE including surgical gown, gloves, face shield, cap, shoe covers, and N-95 used during this encounter in a negative pressure room.  - Check daily labs: CBC w/diff, CMP, d-dimer, fibrinogen, ferritin, LDH, CRP - Avoid NSAIDs - s/p actemra 5/27, convalescent plasma 5/29, remdesivir 5/27 - 5/31. - s/p abx x7 days.  Antibiotics changed to Zosyn as of 6/12 for aspiration coverage Ventilator support  as per critical care  Cardiac arrest: Potential anoxic brain injury.  Initial CT scan was unremarkable, we will recheck in the next few days and get stabilized on the ventilator  Oral candidiasis: - Has been given fluconazole x7 days.  T2DM: HbA1c 9.9%.  - Hold home metformin, glipizide - Continue SSI, will restart tube feeds at trickle. With large jump in CBGs, have started very low-dose Lantus  Hypotension: BPs trending down, worse with sedation.  - Neo gtt, wean as able - DC old subclavian line, insert PICC  AFib with RVR:  - Amiodarone had to be held due to bradycardia  Obesity: BMI: 38. Contributes to poor prognosis - Optimize nutritional status per dietitian  Hypernatremia: Staying in the 150s  Seizure-like movements Nursing noted a few minutes seizure like activity (clonic jerking of both arms, left leg) that were self-limited in a.m. 6/7, in context of tachycardia.  Patient subsequently agitated and moving all extremities.  No focal weakness.   Also, no further posturing, no further seizure like activity today.  Doubt status. CT head 6/7 unremarkable.  No further jerking.  Suspect this was agitation, not seizure.  DVT prophylaxis: Lovenox Code Status: Full Family Communication: Left message for family Disposition Plan: Remain in ICU, remains quite critically ill, poor prognosis  Consultants:   PCCM  Procedures:   5/29 ETT  5/29 - 6/10 L subclavian central line  6/10 PICC  Antimicrobials:  Azithromycin 5/26 >> 6/2  Cefepime 5/27 >> 6/2 and 6/7 >> 6/12  Fluconazole 6/2 >> 6/9  Remdesivir 5/27 >> 5/31  Vancomycin 5/26 >> 5/28  And 6/7 >>  Metronidazole 6/11  IV Zosyn 6/12-present  Objective: Vitals:   06/01/19 0630 06/01/19 0645 06/01/19 0700 06/01/19 0800  BP: 93/60 128/76 118/65 122/64  Pulse: (!) 102 (!) 107  (!) 110  Resp: 15 16  19   Temp:    (!) 101 F (38.3 C)  TempSrc:    Axillary  SpO2: 90% 91%  93%  Height:        Intake/Output  Summary (Last 24 hours) at 06/01/2019 1424 Last data filed at 06/01/2019 1000 Gross per 24 hour  Intake 3491.87 ml  Output 2580 ml  Net 911.87 ml   Filed Weights   Gen: 63 y.o. male in no distress HEENT: Intubated Pulm: Coarse bilateral breath sounds, tachypneic CV: Regular rhythm, borderline tachycardia, occasional ectopic beat GI: Abdomen soft, non-distended, with normoactive bowel sounds.  Ext: Warm, no deformities, trace pitting edema Skin: No rashes, lesions or ulcers on visualized skin. Neuro: Sedated Psych: Sedated, no acute agitation  Data Reviewed: I have personally reviewed following labs and imaging studies  CBC: Recent Labs  Lab 05/30/19 0430 05/30/19 0839  05/31/19 0006  05/31/19 0200 05/31/19 0315 05/31/19 1159 06/01/19 0500 06/01/19 0827  WBC QUESTIONABLE RESULTS, RECOMMEND RECOLLECT TO VERIFY 9.1  --  14.3*  --   --  19.6*  --  8.0  --   HGB QUESTIONABLE RESULTS, RECOMMEND RECOLLECT TO VERIFY 11.6*   < > 9.1*   < > 10.2* 11.0* 10.9* 8.7* 10.2*  HCT QUESTIONABLE RESULTS, RECOMMEND RECOLLECT TO VERIFY 37.0*   < > 30.4*   < > 30.0* 35.7* 32.0* 27.6* 30.0*  MCV QUESTIONABLE RESULTS, RECOMMEND RECOLLECT TO VERIFY 100.3*  --  107.0*  --   --  104.4*  --  98.6  --   PLT QUESTIONABLE RESULTS, RECOMMEND RECOLLECT TO VERIFY 146*  --  133*  --   --  186  --  127*  --    < > = values in this interval not displayed.   Basic Metabolic Panel: Recent Labs  Lab 05/30/19 0430 05/30/19 0815  05/31/19 0006  05/31/19 0315 05/31/19 1159 05/31/19 2047 06/01/19 0500 06/01/19 0827  NA QUESTIONABLE RESULTS, RECOMMEND RECOLLECT TO VERIFY 145   < > 153*   < > 143 152* 153* 151* 151*  K QUESTIONABLE RESULTS, RECOMMEND RECOLLECT TO VERIFY 3.5   < > 3.0*   < > 2.5* 3.4* 3.1* 2.9* 3.3*  CL QUESTIONABLE RESULTS, RECOMMEND RECOLLECT TO VERIFY 114*  --  118*  --  104  --  113* 97*  --   CO2 QUESTIONABLE RESULTS, RECOMMEND RECOLLECT TO VERIFY 22  --  25  --  28  --  34* 46*  --     GLUCOSE QUESTIONABLE RESULTS, RECOMMEND RECOLLECT TO VERIFY 91  --  294*  --  417*  --  79 88  --   BUN QUESTIONABLE RESULTS, RECOMMEND RECOLLECT TO VERIFY 16  --  43*  --  36*  --  34* 25*  --   CREATININE QUESTIONABLE RESULTS, RECOMMEND RECOLLECT TO VERIFY 0.52*  --  1.01  --  0.81  --  0.88 0.69  --   CALCIUM QUESTIONABLE RESULTS, RECOMMEND RECOLLECT TO VERIFY 6.6*  --  5.5*  --  4.3*  --  6.6* 5.3*  --   MG QUESTIONABLE RESULTS, RECOMMEND RECOLLECT TO VERIFY 2.1  --  1.9  --   --   --  2.4  --   --   PHOS  --   --   --   --   --   --   --  1.0* 1.3*  --    < > = values in this interval not displayed.   GFR: Estimated Creatinine Clearance: 105.7 mL/min (by C-G formula based on SCr of 0.69 mg/dL). Liver Function Tests: Recent Labs  Lab 05/30/19 0430 05/30/19 0815 05/31/19 0315 05/31/19 2047 06/01/19 0500  AST QUESTIONABLE RESULTS, RECOMMEND RECOLLECT TO VERIFY 58* 205* 101* 72*  ALT QUESTIONABLE RESULTS, RECOMMEND RECOLLECT TO VERIFY 82* 162* 166* 116*  ALKPHOS QUESTIONABLE RESULTS, RECOMMEND RECOLLECT TO VERIFY 180* 198* 243* 194*  BILITOT QUESTIONABLE RESULTS, RECOMMEND RECOLLECT TO VERIFY 0.2* 0.3 0.3 0.2*  PROT QUESTIONABLE RESULTS, RECOMMEND RECOLLECT TO VERIFY 5.1* 3.3* 5.1* 4.1*  ALBUMIN QUESTIONABLE RESULTS, RECOMMEND RECOLLECT TO VERIFY 2.1* 1.3* 1.9* 1.5*   No results for input(s): LIPASE, AMYLASE in the last 168 hours. No results for input(s): AMMONIA in the last 168 hours. Coagulation Profile: No results for input(s): INR, PROTIME in the last 168 hours. Cardiac Enzymes: Recent Labs  Lab 05/27/19 2018 05/31/19 0029 05/31/19 0630 05/31/19 1516 06/01/19 0950  TROPONINI <0.03 0.05* 0.05* 0.05* 0.06*   BNP (last 3 results) No results for input(s): PROBNP in the last 8760 hours. HbA1C: No results for input(s): HGBA1C in the last 72 hours. CBG: Recent Labs  Lab 06/01/19 0358 06/01/19 0758 06/01/19 1223 06/01/19 1253 06/01/19 1312  GLUCAP 82 77 58* 63* 147*    Lipid Profile: No results for input(s): CHOL, HDL, LDLCALC, TRIG, CHOLHDL, LDLDIRECT in the last 72 hours. Thyroid Function Tests: No results for input(s): TSH, T4TOTAL, FREET4, T3FREE, THYROIDAB in the last 72 hours. Anemia Panel: No results for input(s): VITAMINB12, FOLATE, FERRITIN, TIBC, IRON, RETICCTPCT in the last 72 hours. Urine analysis:    Component Value Date/Time   COLORURINE YELLOW 05/28/2016 2056   APPEARANCEUR CLEAR 05/28/2016 2056   LABSPEC 1.041 (H) 05/28/2016 2056   PHURINE 5.0 05/28/2016 2056   GLUCOSEU >1000 (A) 05/28/2016 2056   HGBUR NEGATIVE 05/28/2016 2056   BILIRUBINUR negative 06/19/2018 1601   KETONESUR negative 06/19/2018 1601   KETONESUR 15 (A) 05/28/2016 2056   PROTEINUR negative 06/19/2018 1601   PROTEINUR NEGATIVE 05/28/2016 2056   UROBILINOGEN 0.2 06/19/2018 1601   NITRITE Negative 06/19/2018 1601   NITRITE NEGATIVE 05/28/2016 2056   LEUKOCYTESUR Negative 06/19/2018 1601   Recent Results (from the past 240 hour(s))  Culture, respiratory (non-expectorated)     Status: None   Collection Time: 05/26/19  8:48 AM   Specimen: Tracheal Aspirate; Respiratory  Result Value Ref Range Status   Specimen Description   Final    TRACHEAL ASPIRATE Performed at [facility], [address] [place], [place] [address]    Special Requests   Final    NONE Performed at [facility], [address] [place], [place] [address]    Gram Stain   Final    FEW WBC PRESENT, PREDOMINANTLY PMN MODERATE GRAM POSITIVE COCCI MODERATE GRAM NEGATIVE RODS MODERATE GRAM POSITIVE RODS    Culture   Final    MODERATE Consistent with normal respiratory flora. Performed at [facility], [address] [place], [place] [address]    Report Status 05/28/2019 FINAL  Final  Culture, blood (routine x 2)     Status: None   Collection Time: 05/26/19  8:49 AM   Specimen: Right Antecubital; Blood  Result Value Ref Range Status    Specimen Description   Final    RIGHT ANTECUBITAL Performed at [facility], [address] [place], [place] [address]  Special Requests   Final    BOTTLES DRAWN AEROBIC ONLY Blood Culture adequate volume Performed at [facility], [address], [place], [place] [address]    Culture   Final    NO GROWTH 5 DAYS Performed at [facility], [address] [place], [place] [address]    Report Status 05/31/2019 FINAL  Final  Culture, blood (routine x 2)     Status: None   Collection Time: 05/26/19  8:54 AM   Specimen: BLOOD RIGHT HAND  Result Value Ref Range Status   Specimen Description   Final    BLOOD RIGHT HAND Performed at [facility], [address], [place], [place] [address]    Special Requests   Final    BOTTLES DRAWN AEROBIC ONLY Blood Culture adequate volume Performed at [facility], [address], [place], [place] [address]    Culture   Final    NO GROWTH 5 DAYS Performed at [facility], [address], [place], [place] [address]    Report Status 05/31/2019 FINAL  Final      Radiology Studies: Dg Abd 1 View  Result Date: 05/31/2019 CLINICAL DATA:  63 year old male with abdominal distension. COVID-19. EXAM: ABDOMEN - 1 VIEW COMPARISON:  05/27/2019 and earlier. FINDINGS: Portable AP supine view at 0225 hours. Enteric tube terminates in the right upper quadrant, side hole likely at the gastric antrum. Paucity of bowel gas. No dilated loops are evident. No pneumoperitoneum is evident on these supine views. Probable urethral catheter in place. No osseous abnormality identified. Coarse and confluent pulmonary opacity at the visible lung bases. IMPRESSION: 1. Enteric tube side hole at the distal stomach. 2. Paucity of bowel gas, no dilated loops are evident. Electronically Signed   By: Odessa Fleming M.D.   On: 05/31/2019 02:54   Dg Chest Port 1 View  Result Date: 05/31/2019 CLINICAL DATA:   Coronavirus infection.  Respiratory failure. EXAM: PORTABLE CHEST 1 VIEW COMPARISON:  Earlier same day FINDINGS: Endotracheal tube tip 3 cm above the carina. Orogastric or nasogastric tube enters the abdomen. Right arm PICC tip at the SVC RA junction. Widespread pulmonary infiltrates persist, similar to the previous study. IMPRESSION: Persistent widespread pulmonary infiltrates as seen previously. Lines and tubes well positioned. Electronically Signed   By: Paulina Fusi M.D.   On: 05/31/2019 10:23   Dg Chest Port 1 View  Result Date: 05/31/2019 CLINICAL DATA:  63 year old male with respiratory failure. COVID-19. EXAM: PORTABLE CHEST 1 VIEW COMPARISON:  05/29/2019 and earlier. FINDINGS: Portable AP supine view at 0008 hours. Endotracheal tube tip in good position between the clavicles and carina. Enteric tube courses to the abdomen, tip not included. Left subclavian central line has been removed and there is now a right upper extremity approach PICC line in place, tip at the cavoatrial junction level. Calcified pleural plaques along the diaphragm. Widespread bilateral basilar predominant pulmonary opacity, mildly progressed. Associated increased bibasilar air bronchograms. No superimposed pneumothorax. Visible mediastinal contours are stable. No definite pleural effusion. IMPRESSION: 1. Right upper extremity approach PICC line placed, tip at the cavoatrial junction level. Left subclavian central line removed. 2. ET tube in good position. Enteric tube courses to the abdomen. 3. Mild progression of bilateral basilar predominant pulmonary opacity compatible with COVID-19 pneumonia. Underlying calcified pleural plaques. Electronically Signed   By: Odessa Fleming M.D.   On: 05/31/2019 00:55    Scheduled Meds:  chlorhexidine  15 mL Mouth/Throat BID  Chlorhexidine Gluconate Cloth  6 each Topical Daily   enoxaparin (LOVENOX) injection  50 mg Subcutaneous Q12H   famotidine  20 mg Per Tube BID   feeding supplement  (PRO-STAT SUGAR FREE 64)  60 mL Per Tube TID   guaiFENesin-dextromethorphan  15 mL Per Tube Q6H   insulin aspart  0-20 Units Subcutaneous Q4H   insulin glargine  5 Units Subcutaneous QHS   mouth rinse  15 mL Mouth Rinse 10 times per day   multivitamin  15 mL Per Tube Daily   nutrition supplement (JUVEN)  1 packet Per Tube BID BM   sodium chloride flush  10-40 mL Intracatheter Q12H   Continuous Infusions:  sodium chloride Stopped (05/29/19 1741)   [START ON 06/02/2019] calcium gluconate     feeding supplement (GLUCERNA 1.2 CAL) 1,000 mL (06/01/19 1245)   HYDROmorphone 5 mg/hr (06/01/19 0800)   midazolam 1 mg/hr (06/01/19 1242)   norepinephrine (LEVOPHED) Adult infusion Stopped (06/01/19 0437)   piperacillin-tazobactam (ZOSYN)  IV 3.375 g (06/01/19 1320)    sodium bicarbonate (isotonic) infusion in sterile water 125 mL/hr at 06/01/19 0800     LOS: 18 days   Time spent: I have spent 40 minutes in the care of this critically ill patient including medical decision making, face-to-face examination, discussion of care with critical care and review of patient's labs, records and radiologic studies  Hollice EspySendil K Keyandre Pileggi, MD [facility] [email or web address] Password [email or web address] 06/01/2019, 2:24 PM

## 2019-06-01 NOTE — Progress Notes (Signed)
Patient spiked new fever 101.5 axillary. Night shift MD paged and made aware, awaiting response.

## 2019-06-01 NOTE — Progress Notes (Signed)
Niece, Lavone Neri called her family and did a conference call with RN. All questions answered, patient's plan of care relayed.

## 2019-06-01 NOTE — Progress Notes (Signed)
The pt Niece Violeta called and was updated on the patients condition. Her questions were answered and her concerns were addressed.

## 2019-06-01 NOTE — Progress Notes (Signed)
Hypoglycemic Event  CBG 35  Treatment: D50 50 mL (25 gm)  Symptoms: None  Follow-up CBG: Time:0000 CBG Result:90   Possible Reasons for Event: Inadequate meal intake  Comments/MD notified: none at this time    Nonie Hoyer

## 2019-06-01 NOTE — Progress Notes (Signed)
Patient in and out of ST - Afib w/ RVR. Attending paged and made aware. Cardizem drip ordered.

## 2019-06-01 NOTE — Progress Notes (Signed)
Pt faced time with family. Niece stated she would call at a later time for an update on pt's status.

## 2019-06-01 NOTE — Progress Notes (Signed)
CRITICAL VALUE STICKER  CRITICAL VALUE: Ca+ 5.3   RECEIVER (on-site recipient of call): Julieanne Cotton, RN   [place] NOTIFIED: [phone] 06/01/2019  MESSENGER (representative from lab): Rochell  MD NOTIFIED: Sendil   TIME OF NOTIFICATION: 7741  RESPONSE: See orders

## 2019-06-01 NOTE — Progress Notes (Signed)
Patient's family called again for an update on patient. Updates given and all questions answered. Wife request a video chat later this evening if possible. Will notify night RN.

## 2019-06-01 NOTE — Progress Notes (Signed)
NAME:  Lance Mason, MRN:  [id number], DOB:  May 20, 1956, LOS: 76 ADMISSION DATE:  05/06/2019, CONSULTATION DATE: May 15, 2019 REFERRING MD: Dr. Roderic Palau, CHIEF COMPLAINT: Dyspnea  Brief History   63 year old male with diabetes type 2, hypertension admitted on May 26 with acute respiratory failure with hypoxemia due to COVID-19 pneumonia.  Past Medical History  Hypertension Hyperlipidemia DM2  Significant Hospital Events   May 26 admission May 29 intubation, mechanical ventilation in setting of profound encephalopathy May 31 worsening dyssynchrony, hypoxemia, added vecuronium June 1 chemical paralysis used overnight  June 2-5 weaning FiO2/PEEP June 6 weaning well on pressure support June 7 worsening oxygenation/tachycardia fever, started on antibiotics again June 9 following commands, weaning, extubated June 10 vomited, hypoxemic, re-intubated June 12 cardiac arrest  Consults:  Pulmonary and critical care medicine  Procedures:  Endotracheal tube May 29 > June 9, June 10 >> L subclavian CVL May 29 >   Significant Diagnostic Tests:  CT angiogram chest May 27 diffuse bilateral patchy airspace disease groundglass upper lobes predominant, no pulmonary embolism June 1 bilateral lower ext doppler > neg DVT June 1 TTE >>>LVEF > 65%, RV normal but poorly visualized CT head 6/7 > NAICP CT chest 6/8 > No PE, findings consistent with viral pneumonia, increased RLL consolidation, reactive subcarinal lymph node  Micro Data:  May 21 SARS-CoV-2 positive May 26 blood cultures June 7 resp culture > GPC, GNR, GPR > OPF June 7 blood culture >   Antimicrobials/COVID treatment  May 26 azithromycin > June 2 May 27 cefepime > June 2 May 27 vancomycin > May 31 June 2 Fluconazole >  May 27 remdesivir May 27 Tocilizumab x1 5/27 May 29 Convalescent plasma  June 7 Vanc > 6/9 June 7 Cefepime > 6/9, restart 6/10 > 6/12 June 10 Flagyl > 6/12  Zosyn 6/12 >> Interim  history/subjective:  Remains supine Requiring high vent settings  Objective   Blood pressure 118/65, pulse (!) 107, temperature 99.6 F (37.6 C), temperature source Oral, resp. rate 16, height 5\' 5"  (1.651 m), weight 105.6 kg, SpO2 91 %.    Vent Mode: PRVC FiO2 (%):  [60 %-100 %] 80 % Set Rate:  [35 bmp] 35 bmp Vt Set:  [430 mL-490 mL] 430 mL PEEP:  [16 cmH20] 16 cmH20 Plateau Pressure:  [32 cmH20-47 cmH20] 32 cmH20   Intake/Output Summary (Last 24 hours) at 06/01/2019 9371 Last data filed at 06/01/2019 0700 Gross per 24 hour  Intake 5591.78 ml  Output 2205 ml  Net 3386.78 ml   Filed Weights    Examination: Gen:      No acute distress HEENT:  EOMI, sclera anicteric, ETT Neck:     No masses; no thyromegaly Lungs:    Clear to auscultation bilaterally; normal respiratory effort CV:         Regular rate and rhythm; no murmurs Abd:      + bowel sounds; soft, non-tender; no palpable masses, no distension Ext:    Cyanotic toes Skin:      Warm and dry; no rash Neuro: Sedated, unresponsive  Resolved Hospital Problem list     Assessment & Plan:  ARDS due to COVID pneumonia: Oxygenation worse June 7, concern for healthcare associated pneumonia versus worsening COVID ARDS net ventilation Sedate to prevent dysunchrony with vent Will stop proning as he had recent cardiac arrest  Cardiac arrest Likely due to hypoxemia and acidosis Off pressors Stop bicarb as Ph is better Follow ABG  Concern for healthcare associated pneumonia  on May 26, 2019: no evidence, most likely COVID, RLL consolidation On zosyn Follow cultures  A fib. Now in NSR Holding amio due to bradycardia.  Acute encephalopathy, ICU delirium Continue quetiapine Continue prn haldol Monitor QTc  Goals of care Very poor prognosis. Family wants full code Palliative consult  Best practice:  Diet: Tube feeding Pain/Anxiety/Delirium protocol (if indicated): Dilaudid, versed gtt. Rass goal -2 VAP protocol (if  indicated): yes DVT prophylaxis: lovenox bid per COVID protocol GI prophylaxis: famotidine Glucose control: SSI Mobility: passive range of motion in bed Code Status: full Family Communication: Family updated 6/12. Pending today Disposition: remain in ICU  The patient is critically ill with multiple organ system failure and requires high complexity decision making for assessment and support, frequent evaluation and titration of therapies, advanced monitoring, review of radiographic studies and interpretation of complex data.   Critical Care Time devoted to patient care services, exclusive of separately billable procedures, described in this note is 35 minutes.   Chilton GreathousePraveen Essance Gatti MD [facility] Pager [phone] If no answer call [phone] 06/01/2019, 8:17 AM

## 2019-06-01 NOTE — Progress Notes (Signed)
Pt Niece Violeta called and was updated on pt's condition. Her question were answered and her concerns were addressed.

## 2019-06-02 LAB — CBC
HCT: 34.2 % — ABNORMAL LOW (ref 39.0–52.0)
Hemoglobin: 10.6 g/dL — ABNORMAL LOW (ref 13.0–17.0)
MCH: 31.2 pg (ref 26.0–34.0)
MCHC: 31 g/dL (ref 30.0–36.0)
MCV: 100.6 fL — ABNORMAL HIGH (ref 80.0–100.0)
Platelets: 160 10*3/uL (ref 150–400)
RBC: 3.4 MIL/uL — ABNORMAL LOW (ref 4.22–5.81)
RDW: 16.1 % — ABNORMAL HIGH (ref 11.5–15.5)
WBC: 9 10*3/uL (ref 4.0–10.5)
nRBC: 0 % (ref 0.0–0.2)

## 2019-06-02 LAB — COMPREHENSIVE METABOLIC PANEL
ALT: 122 U/L — ABNORMAL HIGH (ref 0–44)
AST: 129 U/L — ABNORMAL HIGH (ref 15–41)
Albumin: 2.1 g/dL — ABNORMAL LOW (ref 3.5–5.0)
Alkaline Phosphatase: 570 U/L — ABNORMAL HIGH (ref 38–126)
Anion gap: 6 (ref 5–15)
BUN: 37 mg/dL — ABNORMAL HIGH (ref 8–23)
CO2: 34 mmol/L — ABNORMAL HIGH (ref 22–32)
Calcium: 7.1 mg/dL — ABNORMAL LOW (ref 8.9–10.3)
Chloride: 108 mmol/L (ref 98–111)
Creatinine, Ser: 0.98 mg/dL (ref 0.61–1.24)
GFR calc Af Amer: >60 mL/min (ref >60)
GFR calc non Af Amer: >60 mL/min (ref >60)
Glucose, Bld: 124 mg/dL — ABNORMAL HIGH (ref 70–99)
Potassium: 3.7 mmol/L (ref 3.5–5.1)
Sodium: 148 mmol/L — ABNORMAL HIGH (ref 135–145)
Total Bilirubin: 0.7 mg/dL (ref 0.3–1.2)
Total Protein: 5.7 g/dL — ABNORMAL LOW (ref 6.5–8.1)

## 2019-06-02 LAB — GLUCOSE, CAPILLARY
Glucose-Capillary: 180 mg/dL — ABNORMAL HIGH (ref 70–99)
Glucose-Capillary: 224 mg/dL — ABNORMAL HIGH (ref 70–99)
Glucose-Capillary: 227 mg/dL — ABNORMAL HIGH (ref 70–99)
Glucose-Capillary: 236 mg/dL — ABNORMAL HIGH (ref 70–99)
Glucose-Capillary: 99 mg/dL (ref 70–99)

## 2019-06-02 LAB — HEPARIN LEVEL (UNFRACTIONATED): Heparin Unfractionated: 1.08 IU/mL — ABNORMAL HIGH (ref 0.30–0.70)

## 2019-06-02 LAB — MAGNESIUM: Magnesium: 2.2 mg/dL (ref 1.7–2.4)

## 2019-06-02 LAB — PHOSPHORUS: Phosphorus: 3.4 mg/dL (ref 2.5–4.6)

## 2019-06-02 LAB — BRAIN NATRIURETIC PEPTIDE: B Natriuretic Peptide: 199.9 pg/mL — ABNORMAL HIGH (ref 0.0–100.0)

## 2019-06-02 MED ORDER — AMIODARONE HCL IN DEXTROSE 360-4.14 MG/200ML-% IV SOLN
30.0000 mg/h | INTRAVENOUS | Status: DC
  Administered 2019-06-02 – 2019-06-05 (×6): 30 mg/h via INTRAVENOUS
  Filled 2019-06-02 (×5): qty 200

## 2019-06-02 MED ORDER — FUROSEMIDE 10 MG/ML IJ SOLN
40.0000 mg | Freq: Two times a day (BID) | INTRAMUSCULAR | Status: DC
  Administered 2019-06-02 – 2019-06-09 (×16): 40 mg via INTRAVENOUS
  Filled 2019-06-02 (×17): qty 4

## 2019-06-02 MED ORDER — AMIODARONE HCL IN DEXTROSE 360-4.14 MG/200ML-% IV SOLN
60.0000 mg/h | INTRAVENOUS | Status: AC
  Administered 2019-06-02: 60 mg/h via INTRAVENOUS
  Filled 2019-06-02 (×2): qty 200

## 2019-06-02 MED ORDER — AMIODARONE LOAD VIA INFUSION
150.0000 mg | Freq: Once | INTRAVENOUS | Status: AC
  Administered 2019-06-04: 150 mg via INTRAVENOUS
  Filled 2019-06-02: qty 83.34

## 2019-06-02 MED ORDER — VECURONIUM BROMIDE 10 MG IV SOLR
10.0000 mg | Freq: Once | INTRAVENOUS | Status: AC
  Administered 2019-06-02: 10 mg via INTRAVENOUS
  Filled 2019-06-02: qty 10

## 2019-06-02 MED ORDER — HEPARIN (PORCINE) 25000 UT/250ML-% IV SOLN
1100.0000 [IU]/h | INTRAVENOUS | Status: DC
  Administered 2019-06-02: 13:00:00 1100 [IU]/h via INTRAVENOUS
  Filled 2019-06-02: qty 250

## 2019-06-02 MED ORDER — VECURONIUM BROMIDE 10 MG IV SOLR
10.0000 mg | INTRAVENOUS | Status: DC | PRN
  Administered 2019-06-02 – 2019-06-09 (×16): 10 mg via INTRAVENOUS
  Filled 2019-06-02 (×15): qty 10

## 2019-06-02 MED ORDER — HEPARIN BOLUS VIA INFUSION
2000.0000 [IU] | Freq: Once | INTRAVENOUS | Status: AC
  Administered 2019-06-02: 13:00:00 2000 [IU] via INTRAVENOUS
  Filled 2019-06-02: qty 2000

## 2019-06-02 MED ORDER — HEPARIN (PORCINE) 25000 UT/250ML-% IV SOLN: 900.0000 [IU]/h | INTRAVENOUS | Status: DC

## 2019-06-02 NOTE — Progress Notes (Signed)
Update given to patients niece and wife via telephone. All questions answered.

## 2019-06-02 NOTE — Progress Notes (Signed)
Pt's oral temp is 102. MD made aware and gave order for cooling blanket. Will continue to monitor pt.

## 2019-06-02 NOTE — Progress Notes (Signed)
[facility] for heparin Indication: atrial fibrillation  No Known Allergies  Patient Measurements: Height: '5\' 5"'  (165.1 cm) Weight: (bed scale incorrect, can't get accurate weight) IBW/kg (Calculated) : 61.5 Heparin Dosing Weight: 85 kg   Vital Signs: Temp: 98.8 F (37.1 C) (06/14 1600) Temp Source: Other (Comment) (06/14 1600) BP: 152/88 (06/14 1800) Pulse Rate: 114 (06/14 1600)  Labs: Recent Labs    05/31/19 0315 05/31/19 0630  05/31/19 1516 05/31/19 2047 06/01/19 0500 06/01/19 0827 06/01/19 0950 06/02/19 0403 06/02/19 1750  HGB 11.0*  --    < >  --   --  8.7* 10.2*  --  10.6*  --   HCT 35.7*  --    < >  --   --  27.6* 30.0*  --  34.2*  --   PLT 186  --   --   --   --  127*  --   --  160  --   HEPARINUNFRC  --   --   --   --   --   --   --   --   --  1.08*  CREATININE 0.81  --   --   --  0.88 0.69  --   --  0.98  --   TROPONINI  --  0.05*  --  0.05*  --   --   --  0.06*  --   --    < > = values in this interval not displayed.    Estimated Creatinine Clearance: 86.3 mL/min (by C-G formula based on SCr of 0.98 mg/dL).   Medical History: Past Medical History:  Diagnosis Date  . Cataract   . Diabetes mellitus without complication ([place])   . Hyperlipidemia   . Hypertension   . Pleural plaque 10/05/2015   calcified pleural plaque bilaterally on CT chest    Medications:  Medications Prior to Admission  Medication Sig Dispense Refill Last Dose  . [EXPIRED] benzonatate (TESSALON) 200 MG capsule Take 1 capsule (200 mg total) by mouth 3 (three) times daily as needed for up to 7 days for cough. 28 capsule 0 Past Week at Unknown time  . metFORMIN (GLUCOPHAGE) 1000 MG tablet Take 1 tablet (1,000 mg total) by mouth 2 (two) times daily with a meal. 180 tablet 1 unk  . ondansetron (ZOFRAN) 4 MG tablet Take 1 tablet (4 mg total) by mouth every 8 (eight) hours as needed for nausea or vomiting. 12 tablet 0 Past Week at Unknown time  .  ACCU-CHEK AVIVA PLUS test strip Check sugar once daily; spanish label. 100 each 7   . ACCU-CHEK SOFTCLIX LANCETS lancets Check sugar once daily 100 each 3   . atorvastatin (LIPITOR) 20 MG tablet Take 1 tablet (20 mg total) by mouth daily. 90 tablet 1   . blood glucose meter kit and supplies Dispense based on patient and insurance preference. Use up to four times daily as directed. (FOR ICD-9 250.00, 250.01). 1 each 0   . Blood Glucose Monitoring Suppl (ACCU-CHEK AVIVA PLUS) w/Device KIT      . gabapentin (NEURONTIN) 300 MG capsule Take 1 capsule (300 mg total) by mouth 3 (three) times daily. 90 capsule 1   . glipiZIDE (GLUCOTROL XL) 10 MG 24 hr tablet Take 1 tablet (10 mg total) by mouth daily with breakfast. 90 tablet 1     Assessment: 75 YOM with a prolonged hospital admission currently intubated. He developed Afib and poorly rate controlled on diltiazem.  Pharmacy consulted to start IV heparin. H/H low stable, Plt wnl today.   Of note, he has been on lovenox 50 mg BID for VTE prophylaxis and last dose was given around midnight.   PM Update 06/02/19  - HL above goal at 1.08 - no bleeding per RN  Goal of Therapy:  Heparin level 0.3-0.7 units/ml Monitor platelets by anticoagulation protocol: Yes   Plan:  -Will hold heparin infusion x 1 hour and resume at 900 units/hr -F/u 6 hr HL after resumin -Monitor daily HL, CBC and s/s of bleeding   Ulice Dash, PharmD, BCPS Clinical Pharmacist

## 2019-06-02 NOTE — Progress Notes (Signed)
Update given to patient's family and wife via telephone. All questions answered.

## 2019-06-02 NOTE — Progress Notes (Signed)
ANTICOAGULATION CONSULT NOTE - Initial Consult  Pharmacy Consult for heparin Indication: atrial fibrillation  No Known Allergies  Patient Measurements: Height: '5\' 5"'  (165.1 cm) Weight: (bed scale incorrect, can't get accurate weight) IBW/kg (Calculated) : 61.5 Heparin Dosing Weight: 85 kg   Vital Signs: Temp: 99.3 F (37.4 C) (06/14 0630) Temp Source: Rectal (06/14 0630) BP: 149/79 (06/14 0900) Pulse Rate: 125 (06/14 0700)  Labs: Recent Labs    05/31/19 0315 05/31/19 0630  05/31/19 1516 05/31/19 2047 06/01/19 0500 06/01/19 0827 06/01/19 0950 06/02/19 0403  HGB 11.0*  --    < >  --   --  8.7* 10.2*  --  10.6*  HCT 35.7*  --    < >  --   --  27.6* 30.0*  --  34.2*  PLT 186  --   --   --   --  127*  --   --  160  CREATININE 0.81  --   --   --  0.88 0.69  --   --  0.98  TROPONINI  --  0.05*  --  0.05*  --   --   --  0.06*  --    < > = values in this interval not displayed.    Estimated Creatinine Clearance: 86.3 mL/min (by C-G formula based on SCr of 0.98 mg/dL).   Medical History: Past Medical History:  Diagnosis Date  . Cataract   . Diabetes mellitus without complication ([place])   . Hyperlipidemia   . Hypertension   . Pleural plaque 10/05/2015   calcified pleural plaque bilaterally on CT chest    Medications:  Medications Prior to Admission  Medication Sig Dispense Refill Last Dose  . [EXPIRED] benzonatate (TESSALON) 200 MG capsule Take 1 capsule (200 mg total) by mouth 3 (three) times daily as needed for up to 7 days for cough. 28 capsule 0 Past Week at Unknown time  . metFORMIN (GLUCOPHAGE) 1000 MG tablet Take 1 tablet (1,000 mg total) by mouth 2 (two) times daily with a meal. 180 tablet 1 unk  . ondansetron (ZOFRAN) 4 MG tablet Take 1 tablet (4 mg total) by mouth every 8 (eight) hours as needed for nausea or vomiting. 12 tablet 0 Past Week at Unknown time  . ACCU-CHEK AVIVA PLUS test strip Check sugar once daily; spanish label. 100 each 7   . ACCU-CHEK  SOFTCLIX LANCETS lancets Check sugar once daily 100 each 3   . atorvastatin (LIPITOR) 20 MG tablet Take 1 tablet (20 mg total) by mouth daily. 90 tablet 1   . blood glucose meter kit and supplies Dispense based on patient and insurance preference. Use up to four times daily as directed. (FOR ICD-9 250.00, 250.01). 1 each 0   . Blood Glucose Monitoring Suppl (ACCU-CHEK AVIVA PLUS) w/Device KIT      . gabapentin (NEURONTIN) 300 MG capsule Take 1 capsule (300 mg total) by mouth 3 (three) times daily. 90 capsule 1   . glipiZIDE (GLUCOTROL XL) 10 MG 24 hr tablet Take 1 tablet (10 mg total) by mouth daily with breakfast. 90 tablet 1     Assessment: 70 YOM with a prolonged hospital admission currently intubated. He developed Afib and poorly rate controlled on diltiazem. Pharmacy consulted to start IV heparin. H/H low stable, Plt wnl today.   Of note, he has been on lovenox 50 mg BID for VTE prophylaxis and last dose was given around midnight.   Goal of Therapy:  Heparin level 0.3-0.7 units/ml Monitor  platelets by anticoagulation protocol: Yes   Plan:  -D/c SQ lovenox  -Heparin 2000 units IV bolus, then start IV heparin at 1100 units/hr  -F/u 6 hr HL -Monitor daily HL, CBC and s/s of bleeding   Albertina Parr, PharmD., BCPS Clinical Pharmacist Clinical phone for 06/02/19 until 5pm: [phone]

## 2019-06-02 NOTE — Progress Notes (Signed)
Pt is becoming increasing dyssynchronous with the vent and the pt Pox2 dropped to 82%. Rt was notified and responded to bedside. MD also notified and gave orders. RN followed orders. Will continue to monitor pt.

## 2019-06-02 NOTE — Progress Notes (Addendum)
NAME:  Lance Mason, MRN:  [id number], DOB:  25-May-1956, LOS: 19 ADMISSION DATE:  May 29, 2019, CONSULTATION DATE: May 15, 2019 REFERRING MD: Dr. Kerry HoughMemon, CHIEF COMPLAINT: Dyspnea  Brief History   63 year old male with diabetes type 2, hypertension admitted on May 26 with acute respiratory failure with hypoxemia due to COVID-19 pneumonia.  Past Medical History  Hypertension Hyperlipidemia DM2  Significant Hospital Events   May 26 admission May 29 intubation, mechanical ventilation in setting of profound encephalopathy May 31 worsening dyssynchrony, hypoxemia, added vecuronium June 1 chemical paralysis used overnight  June 2-5 weaning FiO2/PEEP June 6 weaning well on pressure support June 7 worsening oxygenation/tachycardia fever, started on antibiotics again June 9 following commands, weaning, extubated June 10 vomited, hypoxemic, re-intubated June 12 cardiac arrest. Stopped proning due to instability  Consults:  Pulmonary and critical care medicine  Procedures:  Endotracheal tube May 29 > June 9, June 10 >> L subclavian CVL May 29 >   Significant Diagnostic Tests:  CT angiogram chest May 27 diffuse bilateral patchy airspace disease groundglass upper lobes predominant, no pulmonary embolism June 1 bilateral lower ext doppler > neg DVT June 1 TTE >>>LVEF > 65%, RV normal but poorly visualized CT head 6/7 > NAICP CT chest 6/8 > No PE, findings consistent with viral pneumonia, increased RLL consolidation, reactive subcarinal lymph node  Micro Data:  May 21 SARS-CoV-2 positive May 26 blood cultures June 7 resp culture > GPC, GNR, GPR > OPF June 7 blood culture >   Antimicrobials/COVID treatment  May 26 azithromycin > June 2 May 27 cefepime > June 2 May 27 vancomycin > May 31 June 2 Fluconazole >  May 27 remdesivir May 27 Tocilizumab x1 5/27 May 29 Convalescent plasma  June 7 Vanc > 6/9 June 7 Cefepime > 6/9, restart 6/10 > 6/12 June 10 Flagyl > 6/12   Zosyn 6/12 >> Interim history/subjective:  Afib overnight. Started on cardizem drip Spiking fevers  Objective   Blood pressure (!) 175/86, pulse (!) 125, temperature 99.3 F (37.4 C), temperature source Rectal, resp. rate 19, height 5\' 5"  (1.651 m), weight 105.6 kg, SpO2 (!) 84 %.    Vent Mode: PRVC FiO2 (%):  [65 %-80 %] 80 % Set Rate:  [35 bmp] 35 bmp Vt Set:  [430 mL] 430 mL PEEP:  [18 cmH20] 18 cmH20 Plateau Pressure:  [32 cmH20-46 cmH20] 46 cmH20   Intake/Output Summary (Last 24 hours) at 06/02/2019 0901 Last data filed at 06/02/2019 0700 Gross per 24 hour  Intake 2139.94 ml  Output 3230 ml  Net -1090.06 ml   Filed Weights    Examination: Gen:      No acute distress HEENT:  EOMI, sclera anicteric Neck:     No masses; no thyromegaly, ETT Lungs:    Clear to auscultation bilaterally; normal respiratory effort CV:         Regular rate and rhythm; no murmurs Abd:      + bowel sounds; soft, non-tender; no palpable masses, no distension Ext:   Blue toes Skin:      Warm and dry; no rash Neuro: Sedated, unresponsive  Resolved Hospital Problem list     Assessment & Plan:  ARDS due to COVID pneumonia: Oxygenation worse June 7, concern for healthcare associated pneumonia versus worsening COVID ARDS net ventilation Sedate to prevent dysunchrony with vent Off proning as he had recent cardiac arrest Lasix for diuresis Intermittent doses of vecuronium for vent sysynchrony  Cardiac arrest Likely due to hypoxemia and acidosis  Off pressors Follow ABG  Concern for healthcare associated pneumonia on May 26, 2019: no evidence, most likely COVID, RLL consolidation On zosyn Sputum cx  A fib. Cardizem gtt. Still poorly controlled Lopressor PRN Start amiodarone Start heparin anticoagulation  Acute encephalopathy, ICU delirium Continue quetiapine Continue prn haldol Monitor QTc  Goals of care Very poor prognosis. Family wants full code Palliative consult  Best  practice:  Diet: Tube feeding Pain/Anxiety/Delirium protocol (if indicated): Dilaudid, versed gtt. Rass goal -2 VAP protocol (if indicated): yes DVT prophylaxis: lovenox bid per COVID protocol GI prophylaxis: famotidine Glucose control: SSI Mobility: passive range of motion in bed Code Status: full Family Communication: per TRH Disposition: remain in ICU  The patient is critically ill with multiple organ system failure and requires high complexity decision making for assessment and support, frequent evaluation and titration of therapies, advanced monitoring, review of radiographic studies and interpretation of complex data.   Critical Care Time devoted to patient care services, exclusive of separately billable procedures, described in this note is 35 minutes.   Marshell Garfinkel MD [facility] Pager [phone] If no answer call 336 [phone] 06/02/2019, 9:01 AM

## 2019-06-02 NOTE — Progress Notes (Signed)
Spoke with Niece Lavone Neri and updated her and family on pt current status. Questions were answered and Concerns were addressed.

## 2019-06-02 NOTE — Progress Notes (Signed)
Family called and was updated on patient's status and care.

## 2019-06-02 NOTE — Progress Notes (Signed)
**Note De-identified  Obfuscation** Sputum collected and sent to lab 

## 2019-06-02 NOTE — Progress Notes (Signed)
Niece Lavone Neri called and was updated on the patients condition. Her questions were answered and concerns addressed.

## 2019-06-02 NOTE — Progress Notes (Signed)
PROGRESS NOTE  Lance Mason  WUJ:[id number] DOB: [birth year] DOA: 2019-05-04 PCP: Patient, No Pcp Per   Brief Narrative: Lance Mason is a 63 y.o. male with a history of T2DM, HTN, obesity who presented with cough and shortness of breath and fever on 5/26, found to be hypoxic requiring NRB. CXR demonstrated bilateral opacities and he was [facility] for covid-19 pneumonia, ultimately intubated.  Patient was able to be weaned off and extubated, but then ended up being reintubated on 6/10.  On the night of 6/11, he developed bradycardia that went into asystole briefly and was resuscitated with brief CPR treated with epinephrine and bicarbonate. lactic acid level trended upward, currently at 4.0.  CBGs elevated in the 400s.  Antibiotics changed to better cover aspiration.  Patient continues to do poorly.  ARDS present.  Amiodarone held for bradycardic episodes. This morning, lactic acid resolved & sodium continues to improve  Assessment & Plan: Principal Problem:   Acute respiratory failure with hypoxemia (HCC) Active Problems:   Type 2 diabetes mellitus without complication, without long-term current use of insulin (HCC)   Pure hypercholesterolemia   Pneumonia due to COVID-19 virus   CAP (community acquired pneumonia)   Abnormal liver function   Pressure injury of skin  Acute hypoxic respiratory failure due to covid-19 pneumonia and suspected aspiration pneumonia with ARDS: Reintubated 6/10, - s/p actemra 5/27, convalescent plasma 5/29 - s/p remdesivir 5/27 - 5/31.  - Continue airborne, contact precautions. PPE including surgical gown, gloves, face shield, cap, shoe covers, and N-95 used during this encounter in a negative pressure room.  -Following daily labs: CBC w/diff, CMP, d-dimer, fibrinogen, ferritin, LDH, CRP - Avoid NSAIDs - s/p actemra 5/27, convalescent plasma 5/29, remdesivir 5/27 - 5/31. - s/p abx x7 days.  Antibiotics changed to Zosyn as of 6/12 for  aspiration coverage Ventilator support as per critical care  Cardiac arrest: Potential anoxic brain injury.  Initial CT scan was unremarkable, we will recheck in the next few days and get stabilized on the ventilator  Oral candidiasis: - Has been given fluconazole x7 days.  T2DM: HbA1c 9.9%.  - Hold home metformin, glipizide - Continue SSI, will restart tube feeds at trickle. With large jump in CBGs, have started very low-dose Lantus  Hypotension: BPs trending down, worse with sedation.  - Neo gtt, wean as able - DC old subclavian line, insert PICC  AFib with RVR:  - Amiodarone had to be held due to bradycardia  Obesity: BMI: 38. Contributes to poor prognosis - Optimize nutritional status per dietitian  Hypernatremia: Patient starting to improve  Seizure-like movements Nursing noted a few minutes seizure like activity (clonic jerking of both arms, left leg) that were self-limited in a.m. 6/7, in context of tachycardia.  Patient subsequently agitated and moving all extremities.  No focal weakness.   Also, no further posturing, no further seizure like activity today.  Doubt status. CT head 6/7 unremarkable.  No further jerking.  Suspect this was agitation, not seizure.  DVT prophylaxis: Lovenox Code Status: Full Family Communication: Left message for family Disposition Plan: Remain in ICU, remains quite critically ill, poor prognosis  Consultants:   PCCM  Procedures:   5/29 ETT  5/29 - 6/10 L subclavian central line  6/10 PICC  Antimicrobials:  Azithromycin 5/26 >> 6/2  Cefepime 5/27 >> 6/2 and 6/7 >> 6/12  Fluconazole 6/2 >> 6/9  Remdesivir 5/27 >> 5/31  Vancomycin 5/26 >> 5/28  And 6/7 >>  Metronidazole 6/11-6/12  IV Zosyn 6/12-present   Objective: Vitals:   06/02/19 1000 06/02/19 1100 06/02/19 1200 06/02/19 1300  BP: 131/83 122/74 129/82 130/80  Pulse:      Resp:      Temp:      TempSrc:      SpO2:      Height:        Intake/Output Summary  (Last 24 hours) at 06/02/2019 1437 Last data filed at 06/02/2019 0700 Gross per 24 hour  Intake 1492.38 ml  Output 2555 ml  Net -1062.62 ml   Filed Weights   Gen: 63 y.o. male in no distress HEENT: Intubated Pulm: Coarse bilateral breath sounds, tachypneic CV: Regular rhythm, borderline tachycardia, occasional ectopic beat GI: Abdomen soft, non-distended, with normoactive bowel sounds.  Ext: Warm, no deformities, trace pitting edema Skin: No rashes, lesions or ulcers on visualized skin. Neuro: Sedated Psych: Sedated, no acute agitation  Data Reviewed: I have personally reviewed following labs and imaging studies  CBC: Recent Labs  Lab 05/30/19 0839  05/31/19 0006  05/31/19 0315 05/31/19 1159 06/01/19 0500 06/01/19 0827 06/02/19 0403  WBC 9.1  --  14.3*  --  19.6*  --  8.0  --  9.0  HGB 11.6*   < > 9.1*   < > 11.0* 10.9* 8.7* 10.2* 10.6*  HCT 37.0*   < > 30.4*   < > 35.7* 32.0* 27.6* 30.0* 34.2*  MCV 100.3*  --  107.0*  --  104.4*  --  98.6  --  100.6*  PLT 146*  --  133*  --  186  --  127*  --  160   < > = values in this interval not displayed.   Basic Metabolic Panel: Recent Labs  Lab 05/30/19 0430 05/30/19 0815  05/31/19 0006  05/31/19 0315 05/31/19 1159 05/31/19 2047 06/01/19 0500 06/01/19 0827 06/02/19 0403  NA QUESTIONABLE RESULTS, RECOMMEND RECOLLECT TO VERIFY 145   < > 153*   < > 143 152* 153* 151* 151* 148*  K QUESTIONABLE RESULTS, RECOMMEND RECOLLECT TO VERIFY 3.5   < > 3.0*   < > 2.5* 3.4* 3.1* 2.9* 3.3* 3.7  CL QUESTIONABLE RESULTS, RECOMMEND RECOLLECT TO VERIFY 114*  --  118*  --  104  --  113* 97*  --  108  CO2 QUESTIONABLE RESULTS, RECOMMEND RECOLLECT TO VERIFY 22  --  25  --  28  --  34* 46*  --  34*  GLUCOSE QUESTIONABLE RESULTS, RECOMMEND RECOLLECT TO VERIFY 91  --  294*  --  417*  --  79 88  --  124*  BUN QUESTIONABLE RESULTS, RECOMMEND RECOLLECT TO VERIFY 16  --  43*  --  36*  --  34* 25*  --  37*  CREATININE QUESTIONABLE RESULTS, RECOMMEND  RECOLLECT TO VERIFY 0.52*  --  1.01  --  0.81  --  0.88 0.69  --  0.98  CALCIUM QUESTIONABLE RESULTS, RECOMMEND RECOLLECT TO VERIFY 6.6*  --  5.5*  --  4.3*  --  6.6* 5.3*  --  7.1*  MG QUESTIONABLE RESULTS, RECOMMEND RECOLLECT TO VERIFY 2.1  --  1.9  --   --   --  2.4  --   --  2.2  PHOS  --   --   --   --   --   --   --  1.0* 1.3*  --  3.4   < > = values in this interval not displayed.   GFR: Estimated Creatinine Clearance:  86.3 mL/min (by C-G formula based on SCr of 0.98 mg/dL). Liver Function Tests: Recent Labs  Lab 05/30/19 0815 05/31/19 0315 05/31/19 2047 06/01/19 0500 06/02/19 0403  AST 58* 205* 101* 72* 129*  ALT 82* 162* 166* 116* 122*  ALKPHOS 180* 198* 243* 194* 570*  BILITOT 0.2* 0.3 0.3 0.2* 0.7  PROT 5.1* 3.3* 5.1* 4.1* 5.7*  ALBUMIN 2.1* 1.3* 1.9* 1.5* 2.1*   No results for input(s): LIPASE, AMYLASE in the last 168 hours. No results for input(s): AMMONIA in the last 168 hours. Coagulation Profile: No results for input(s): INR, PROTIME in the last 168 hours. Cardiac Enzymes: Recent Labs  Lab 05/27/19 2018 05/31/19 0029 05/31/19 0630 05/31/19 1516 06/01/19 0950  TROPONINI <0.03 0.05* 0.05* 0.05* 0.06*   BNP (last 3 results) No results for input(s): PROBNP in the last 8760 hours. HbA1C: No results for input(s): HGBA1C in the last 72 hours. CBG: Recent Labs  Lab 06/01/19 1549 06/01/19 2017 06/01/19 2355 06/02/19 0410 06/02/19 1230  GLUCAP 94 191* 180* 99 236*   Lipid Profile: No results for input(s): CHOL, HDL, LDLCALC, TRIG, CHOLHDL, LDLDIRECT in the last 72 hours. Thyroid Function Tests: No results for input(s): TSH, T4TOTAL, FREET4, T3FREE, THYROIDAB in the last 72 hours. Anemia Panel: No results for input(s): VITAMINB12, FOLATE, FERRITIN, TIBC, IRON, RETICCTPCT in the last 72 hours. Urine analysis:    Component Value Date/Time   COLORURINE YELLOW 05/28/2016 2056   APPEARANCEUR CLEAR 05/28/2016 2056   LABSPEC 1.041 (H) 05/28/2016 2056    PHURINE 5.0 05/28/2016 2056   GLUCOSEU >1000 (A) 05/28/2016 2056   HGBUR NEGATIVE 05/28/2016 2056   BILIRUBINUR negative 06/19/2018 1601   KETONESUR negative 06/19/2018 1601   KETONESUR 15 (A) 05/28/2016 2056   PROTEINUR negative 06/19/2018 1601   PROTEINUR NEGATIVE 05/28/2016 2056   UROBILINOGEN 0.2 06/19/2018 1601   NITRITE Negative 06/19/2018 1601   NITRITE NEGATIVE 05/28/2016 2056   LEUKOCYTESUR Negative 06/19/2018 1601   Recent Results (from the past 240 hour(s))  Culture, respiratory (non-expectorated)     Status: None   Collection Time: 05/26/19  8:48 AM   Specimen: Tracheal Aspirate; Respiratory  Result Value Ref Range Status   Specimen Description   Final    TRACHEAL ASPIRATE Performed at [facility], [address] [place], [place] [address]    Special Requests   Final    NONE Performed at [facility], [address] [place], [place] [address]    Gram Stain   Final    FEW WBC PRESENT, PREDOMINANTLY PMN MODERATE GRAM POSITIVE COCCI MODERATE GRAM NEGATIVE RODS MODERATE GRAM POSITIVE RODS    Culture   Final    MODERATE Consistent with normal respiratory flora. Performed at [facility], [address] [place], [place] [address]    Report Status 05/28/2019 FINAL  Final  Culture, blood (routine x 2)     Status: None   Collection Time: 05/26/19  8:49 AM   Specimen: Right Antecubital; Blood  Result Value Ref Range Status   Specimen Description   Final    RIGHT ANTECUBITAL Performed at [facility], [address] [place], [place] [address]    Special Requests   Final    BOTTLES DRAWN AEROBIC ONLY Blood Culture adequate volume Performed at [facility], [address] [place], [place] [address]    Culture   Final    NO GROWTH 5 DAYS Performed at [facility], [address] [place], [place] [address]    Report Status 05/31/2019 FINAL  Final  Culture, blood (routine x  2)     Status: None   Collection Time: 05/26/19  8:54 AM   Specimen: BLOOD RIGHT HAND  Result Value Ref Range Status   Specimen Description   Final    BLOOD RIGHT HAND Performed at [facility] [address], [place], [place] [address]    Special Requests   Final    BOTTLES DRAWN AEROBIC ONLY Blood Culture adequate volume Performed at [facility] [address], [place], [place] [address]    Culture   Final    NO GROWTH 5 DAYS Performed at [facility], [place] [address] [place], [place] [address]    Report Status 05/31/2019 FINAL  Final      Radiology Studies: No results found.  Scheduled Meds: . amiodarone  150 mg Intravenous Once  . chlorhexidine  15 mL Mouth/Throat BID  . Chlorhexidine Gluconate Cloth  6 each Topical Daily  . famotidine  20 mg Per Tube BID  . feeding supplement (PRO-STAT SUGAR FREE 64)  60 mL Per Tube TID  . furosemide  40 mg Intravenous Q12H  . guaiFENesin-dextromethorphan  15 mL Per Tube Q6H  . insulin aspart  0-20 Units Subcutaneous Q4H  . insulin glargine  5 Units Subcutaneous QHS  . mouth rinse  15 mL Mouth Rinse 10 times per day  . multivitamin  15 mL Per Tube Daily  . nutrition supplement (JUVEN)  1 packet Per Tube BID BM  . sodium chloride flush  10-40 mL Intracatheter Q12H   Continuous Infusions: . sodium chloride Stopped (05/29/19 1741)  . amiodarone 60 mg/hr (06/02/19 0914)   Followed by  . amiodarone    . diltiazem (CARDIZEM) infusion 10 mg/hr (06/02/19 0700)  . feeding supplement (GLUCERNA 1.2 CAL) 1,000 mL (06/01/19 1245)  . heparin 1,100 Units/hr (06/02/19 1300)  . HYDROmorphone 5 mg/hr (06/02/19 0700)  . midazolam 1 mg/hr (06/02/19 0700)  . norepinephrine (LEVOPHED) Adult infusion Stopped (06/01/19 0437)  . piperacillin-tazobactam (ZOSYN)  IV 3.375 g (06/02/19 1239)     LOS: 19 days   Time spent: I have spent 35 minutes in the care of this critically ill patient including medical  decision making, face-to-face examination, discussion of care with critical care and review of patient's labs, records and radiologic studies  Annita Brod, MD [facility] [email or web address] Password [facility] 06/02/2019, 2:37 PM

## 2019-06-03 LAB — POCT I-STAT 7, (LYTES, BLD GAS, ICA,H+H)
Acid-Base Excess: 15 mmol/L — ABNORMAL HIGH (ref 0.0–2.0)
Bicarbonate: 41.8 mmol/L — ABNORMAL HIGH (ref 20.0–28.0)
Calcium, Ion: 1.05 mmol/L — ABNORMAL LOW (ref 1.15–1.40)
HCT: 33 % — ABNORMAL LOW (ref 39.0–52.0)
Hemoglobin: 11.2 g/dL — ABNORMAL LOW (ref 13.0–17.0)
O2 Saturation: 80 %
Patient temperature: 98.8
Potassium: 4 mmol/L (ref 3.5–5.1)
Sodium: 148 mmol/L — ABNORMAL HIGH (ref 135–145)
TCO2: 44 mmol/L — ABNORMAL HIGH (ref 22–32)
pCO2 arterial: 66 mmHg (ref 32.0–48.0)
pH, Arterial: 7.41 (ref 7.350–7.450)
pO2, Arterial: 47 mmHg — ABNORMAL LOW (ref 83.0–108.0)

## 2019-06-03 LAB — COMPREHENSIVE METABOLIC PANEL
ALT: 121 U/L — ABNORMAL HIGH (ref 0–44)
AST: 95 U/L — ABNORMAL HIGH (ref 15–41)
Albumin: 2.1 g/dL — ABNORMAL LOW (ref 3.5–5.0)
Alkaline Phosphatase: 539 U/L — ABNORMAL HIGH (ref 38–126)
Anion gap: 9 (ref 5–15)
BUN: 38 mg/dL — ABNORMAL HIGH (ref 8–23)
CO2: 36 mmol/L — ABNORMAL HIGH (ref 22–32)
Calcium: 7.1 mg/dL — ABNORMAL LOW (ref 8.9–10.3)
Chloride: 101 mmol/L (ref 98–111)
Creatinine, Ser: 0.81 mg/dL (ref 0.61–1.24)
GFR calc Af Amer: >60 mL/min (ref >60)
GFR calc non Af Amer: >60 mL/min (ref >60)
Glucose, Bld: 237 mg/dL — ABNORMAL HIGH (ref 70–99)
Potassium: 3.3 mmol/L — ABNORMAL LOW (ref 3.5–5.1)
Sodium: 146 mmol/L — ABNORMAL HIGH (ref 135–145)
Total Bilirubin: 0.1 mg/dL — ABNORMAL LOW (ref 0.3–1.2)
Total Protein: 6.2 g/dL — ABNORMAL LOW (ref 6.5–8.1)

## 2019-06-03 LAB — CBC
HCT: 37.1 % — ABNORMAL LOW (ref 39.0–52.0)
Hemoglobin: 11.5 g/dL — ABNORMAL LOW (ref 13.0–17.0)
MCH: 31.7 pg (ref 26.0–34.0)
MCHC: 31 g/dL (ref 30.0–36.0)
MCV: 102.2 fL — ABNORMAL HIGH (ref 80.0–100.0)
Platelets: 150 10*3/uL (ref 150–400)
RBC: 3.63 MIL/uL — ABNORMAL LOW (ref 4.22–5.81)
RDW: 16.4 % — ABNORMAL HIGH (ref 11.5–15.5)
WBC: 10.2 10*3/uL (ref 4.0–10.5)
nRBC: 0.4 % — ABNORMAL HIGH (ref 0.0–0.2)

## 2019-06-03 LAB — GLUCOSE, CAPILLARY
Glucose-Capillary: 226 mg/dL — ABNORMAL HIGH (ref 70–99)
Glucose-Capillary: 171 mg/dL — ABNORMAL HIGH (ref 70–99)
Glucose-Capillary: 202 mg/dL — ABNORMAL HIGH (ref 70–99)
Glucose-Capillary: 86 mg/dL (ref 70–99)
Glucose-Capillary: 237 mg/dL — ABNORMAL HIGH (ref 70–99)
Glucose-Capillary: 54 mg/dL — ABNORMAL LOW (ref 70–99)
Glucose-Capillary: 131 mg/dL — ABNORMAL HIGH (ref 70–99)
Glucose-Capillary: 142 mg/dL — ABNORMAL HIGH (ref 70–99)

## 2019-06-03 LAB — HEPARIN LEVEL (UNFRACTIONATED)
Heparin Unfractionated: 0.29 IU/mL — ABNORMAL LOW (ref 0.30–0.70)
Heparin Unfractionated: 0.32 IU/mL (ref 0.30–0.70)
Heparin Unfractionated: 0.23 IU/mL — ABNORMAL LOW (ref 0.30–0.70)

## 2019-06-03 MED ORDER — HEPARIN (PORCINE) 25000 UT/250ML-% IV SOLN
1000.0000 [IU]/h | INTRAVENOUS | Status: DC
  Administered 2019-06-03: 09:00:00 1000 [IU]/h via INTRAVENOUS
  Filled 2019-06-03: qty 250

## 2019-06-03 MED ORDER — HEPARIN (PORCINE) 25000 UT/250ML-% IV SOLN
1200.0000 [IU]/h | INTRAVENOUS | Status: DC
  Administered 2019-06-04: 09:00:00 1150 [IU]/h via INTRAVENOUS
  Administered 2019-06-05: 09:00:00 1000 [IU]/h via INTRAVENOUS
  Administered 2019-06-06 – 2019-06-07 (×2): 1100 [IU]/h via INTRAVENOUS
  Filled 2019-06-03 (×4): qty 250

## 2019-06-03 MED ORDER — POTASSIUM CHLORIDE 20 MEQ/15ML (10%) PO SOLN
40.0000 meq | Freq: Three times a day (TID) | ORAL | Status: AC
  Administered 2019-06-03 (×2): 40 meq
  Filled 2019-06-03 (×2): qty 30

## 2019-06-03 MED ORDER — POTASSIUM CHLORIDE 20 MEQ/15ML (10%) PO SOLN
40.0000 meq | Freq: Three times a day (TID) | ORAL | Status: DC
  Administered 2019-06-03: 40 meq via ORAL
  Filled 2019-06-03: qty 30

## 2019-06-03 MED ORDER — STERILE WATER FOR INJECTION IJ SOLN
INTRAMUSCULAR | Status: AC
  Administered 2019-06-03: 10 mL
  Filled 2019-06-03: qty 10

## 2019-06-03 NOTE — Progress Notes (Signed)
Pharmacy Antibiotic Note  Lance Mason is a 63 y.o. male admitted on 04/29/2019. Patient was changed from Cefepime (s/p 6 days) to Zosyn for aspiration pneumonia on 6/12 (vomited and required re-intubation 6/10), and worsening leukocytosis with continued fever spikes. D4 Zosyn WBC remains WNL; Tm 101  Plan:  Zosyn 3.375 gm IV Q 8 hours   Monitor clinical progress    Height: 5\' 5"  (165.1 cm) Weight: (bed scale incorrect, can't get accurate weight) IBW/kg (Calculated) : 61.5  Temp (24hrs), Avg:99 F (37.2 C), Min:96.9 F (36.1 C), Max:101 F (38.3 C)  Recent Labs  Lab 05/27/19 1435  05/31/19 0006 05/31/19 0315 05/31/19 0705 05/31/19 1517 05/31/19 2047 06/01/19 0500 06/01/19 0950 06/02/19 0403 06/03/19 0450  WBC  --    < > 14.3* 19.6*  --   --   --  8.0  --  9.0 10.2  CREATININE  --    < > 1.01 0.81  --   --  0.88 0.69  --  0.98 0.81  LATICACIDVEN 2.4*  --   --   --  4.0* 2.6*  --   --  1.4  --   --    < > = values in this interval not displayed.    Estimated Creatinine Clearance: 104.4 mL/min (by C-G formula based on SCr of 0.81 mg/dL).    No Known Allergies  Antimicrobials this admission: 5/26 cefepime >>6/2, resumed 6/7 >> 6/12 5/26 zmax >> 6/2 5/26 vancomycin >>5/28, resumed 6/7 >> 6/10 5/27 Actemra  5/27 Remdesivir >> 5/31 5/29 Convalescent plasma  6/2 Fluconazole >> 6/8 6/11 Flagyl>> 6/12 Zosyn 6/12>>    Microbiology results: 5/26 BCx2L: negF 5/20 COVID positive  5/27 MRSA PCR: neg  5/31 TA: rare GPC 5/31 BCx: NGF 6/7 TA: normal flora 6/7 MRSA PCR:  6/7 BCx: negF 6/14 BCx: ngtd 6/14 TA: NG  Thank you for allowing pharmacy to be a part of this patient's care.  Gretta Arab PharmD, BCPS Clinical Pharmacist Clinical pharmacist phone 7am- 5pm: [phone] 06/03/2019 9:20 AM

## 2019-06-03 NOTE — Progress Notes (Signed)
NAME:  Lance Mason, MRN:  [id number], DOB:  [birth year], LOS: 20 ADMISSION DATE:  04/29/2019, CONSULTATION DATE: May 15, 2019 REFERRING MD: Dr. Kerry HoughMemon, CHIEF COMPLAINT: Dyspnea  Brief History   63 year old male with diabetes type 2, hypertension admitted on May 26 with acute respiratory failure with hypoxemia due to COVID-19 pneumonia.  Past Medical History  Hypertension Hyperlipidemia DM2  Significant Hospital Events   May 26 admission May 29 intubation, mechanical ventilation in setting of profound encephalopathy May 31 worsening dyssynchrony, hypoxemia, added vecuronium June 1 chemical paralysis used overnight  June 2-5 weaning FiO2/PEEP June 6 weaning well on pressure support June 7 worsening oxygenation/tachycardia fever, started on antibiotics again June 9 following commands, weaning, extubated June 10 vomited, hypoxemic, re-intubated June 12 cardiac arrest. Stopped proning due to instability  Consults:  Pulmonary and critical care medicine  Procedures:  Endotracheal tube May 29 > June 9, June 10 >> L subclavian CVL May 29 >   Significant Diagnostic Tests:  CT angiogram chest May 27 diffuse bilateral patchy airspace disease groundglass upper lobes predominant, no pulmonary embolism June 1 bilateral lower ext doppler > neg DVT June 1 TTE >>>LVEF > 65%, RV normal but poorly visualized CT head 6/7 > NAICP CT chest 6/8 > No PE, findings consistent with viral pneumonia, increased RLL consolidation, reactive subcarinal lymph node  Micro Data:  May 21 SARS-CoV-2 positive May 26 blood cultures June 7 resp culture > GPC, GNR, GPR > OPF June 7 blood culture > Neg  June 14 Blood cx >> June 14 Sputum Cx >>  Antimicrobials/COVID treatment  May 26 azithromycin > June 2 May 27 cefepime > June 2 May 27 vancomycin > May 31 June 2 Fluconazole >  May 27 remdesivir May 27 Tocilizumab x1 5/27 May 29 Convalescent plasma  June 7 Vanc > 6/9 June 7 Cefepime > 6/9,  restart 6/10 > 6/12 June 10 Flagyl > 6/12  Zosyn 6/12 >>  Interim history/subjective:  On amio, heparin for afib No acute events overnight  Objective   Blood pressure 107/67, pulse 98, temperature 99.4 F (37.4 C), temperature source Rectal, resp. rate (!) 35, height 5\' 5"  (1.651 m), weight 105.6 kg, SpO2 96 %.    Vent Mode: PRVC FiO2 (%):  [40 %-80 %] 40 % Set Rate:  [35 bmp] 35 bmp Vt Set:  [430 mL] 430 mL PEEP:  [18 cmH20] 18 cmH20 Plateau Pressure:  [25 cmH20-45 cmH20] 25 cmH20   Intake/Output Summary (Last 24 hours) at 06/03/2019 0816 Last data filed at 06/03/2019 0704 Gross per 24 hour  Intake 1878.01 ml  Output 3525 ml  Net -1646.99 ml   Filed Weights    Examination: Blood pressure 107/67, pulse 98, temperature 99.4 F (37.4 C), temperature source Rectal, resp. rate (!) 35, height 5\' 5"  (1.651 m), weight 105.6 kg, SpO2 96 %. Gen:      No acute distress HEENT:  EOMI, sclera anicteric Neck:     No masses; no thyromegaly, ETT Lungs:    Clear to auscultation bilaterally; normal respiratory effort CV:         Regular rate and rhythm; no murmurs Abd:      + bowel sounds; soft, non-tender; no palpable masses, no distension Ext:    No edema; adequate peripheral perfusion Skin:      Warm and dry; no rash Neuro: Sedated, unresponsive  Resolved Hospital Problem list     Assessment & Plan:  ARDS due to COVID pneumonia: Oxygenation worse June 7, concern  for healthcare associated pneumonia versus worsening COVID ARDS net ventilation Sedate to prevent dysunchrony with vent Off proning as he had recent cardiac arrest Lasix for diuresis Intermittent doses of vecuronium for vent sysynchrony Wean PEEP  Cardiac arrest 6/12 Likely due to hypoxemia and acidosis Off pressors Follow ABG  Aspiration, HCAP On zosyn Follow Sputum cx  A fib. Lopressor PRN On amiodarone, heparin anticoagulation  Acute encephalopathy, ICU delirium Continue quetiapine Continue prn haldol  Monitor QTc  Goals of care Very poor prognosis. Family wants full code  Best practice:  Diet: Tube feeding Pain/Anxiety/Delirium protocol (if indicated): Dilaudid, versed gtt. Rass goal -2 VAP protocol (if indicated): yes DVT prophylaxis: Heparin gtt GI prophylaxis: famotidine Glucose control: SSI, lantus Mobility: passive range of motion in bed Code Status: full Family Communication: per [facility] Disposition: remain in ICU  The patient is critically ill with multiple organ system failure and requires high complexity decision making for assessment and support, frequent evaluation and titration of therapies, advanced monitoring, review of radiographic studies and interpretation of complex data.   Critical Care Time devoted to patient care services, exclusive of separately billable procedures, described in this note is 35 minutes.   Marshell Garfinkel MD [facility] Pager [phone] If no answer call [phone] 06/03/2019, 8:16 AM

## 2019-06-03 NOTE — Progress Notes (Signed)
ANTICOAGULATION CONSULT NOTE - Follow Up Consult  Pharmacy Consult for Heparin IV Indication: atrial fibrillation  No Known Allergies  Patient Measurements: Height: 5\' 5"  (165.1 cm) Weight: (bed scale incorrect, can't get accurate weight) IBW/kg (Calculated) : 61.5 Heparin Dosing Weight: 85 kg  Vital Signs: Temp: 97.9 F (36.6 C) (06/15 1540) Temp Source: Oral (06/15 1540) BP: 162/85 (06/15 1800) Pulse Rate: 104 (06/15 1800)  Labs: Recent Labs    06/01/19 0500  06/01/19 0950 06/02/19 0403  06/03/19 0450 06/03/19 0550 06/03/19 1600 06/03/19 1707  HGB 8.7*   < >  --  10.6*  --  11.5*  --   --  11.2*  HCT 27.6*   < >  --  34.2*  --  37.1*  --   --  33.0*  PLT 127*  --   --  160  --  150  --   --   --   HEPARINUNFRC  --   --   --   --    < > 0.29* 0.32 0.23*  --   CREATININE 0.69  --   --  0.98  --  0.81  --   --   --   TROPONINI  --   --  0.06*  --   --   --   --   --   --    < > = values in this interval not displayed.    Estimated Creatinine Clearance: 104.4 mL/min (by C-G formula based on SCr of 0.81 mg/dL).   Medications:  Infusions:  . sodium chloride Stopped (05/29/19 1741)  . amiodarone 30 mg/hr (06/03/19 1800)  . feeding supplement (GLUCERNA 1.2 CAL) 30 mL/hr at 06/03/19 1800  . heparin 1,000 Units/hr (06/03/19 1800)  . HYDROmorphone 5 mg/hr (06/03/19 1802)  . midazolam 1 mg/hr (06/03/19 1800)  . norepinephrine (LEVOPHED) Adult infusion Stopped (06/01/19 0437)  . piperacillin-tazobactam (ZOSYN)  IV Stopped (06/03/19 1618)    Assessment: 57 yoM admitted on 5/27 with COVID-19 hypoxic respiratory failure, now with a prolonged hospitalization and currently intubated. He developed Afib and poorly rate controlled on diltiazem, now on Amio IV. Pharmacy consulted to start IV heparin. VTE prophylaxis this admission was Lovenox 50 mg BID; 5/27 - 6/14  Today, 06/03/2019:  Heparin level subtherapeutic (0.23) on 1000 units/hr  CBC stable, Hgb increased to 11.2,  Plt remain WNL  SCr <1  No bleeding or complications reported.    Goal of Therapy:  Heparin level 0.3-0.7 units/ml Monitor platelets by anticoagulation protocol: Yes   Plan:  Increase heparin IV infusion to 1150 units/hr Heparin level 6 hours after rate increased Daily heparin level and CBC Continue to monitor H&H and platelets  Peggyann Juba, PharmD, BCPS Pharmacy: [phone] Clinical pharmacist phone 7am- 5pm: [phone] 06/03/2019 6:27 PM

## 2019-06-03 NOTE — Progress Notes (Signed)
Family called and was updated on patient's overnight status.

## 2019-06-03 NOTE — Progress Notes (Signed)
Explained process and risks of visitation to family. Family requested to face-time pt instead. Opportunity provided, all questions/concerns answered at this time.

## 2019-06-03 NOTE — Progress Notes (Signed)
Spoke with family and updated them and answered all questions and concerns raised .

## 2019-06-03 NOTE — Progress Notes (Signed)
PROGRESS NOTE  Lance Mason  BJY:[id number] DOB: 1956/03/01 DOA: 04/30/2019 PCP: Patient, No Pcp Per   Brief Narrative: Lance Mason is a 63 y.o. male with a history of T2DM, HTN, obesity who presented with cough and shortness of breath and fever found to be hypoxic requiring NRB. CXR demonstrated bilateral opacities and [facility]19 pneumonia, ultimately intubated.  Assessment & Plan: Principal Problem:   Acute respiratory failure with hypoxemia (HCC) Active Problems:   Type 2 diabetes mellitus without complication, without long-term current use of insulin (HCC)   Pure hypercholesterolemia   Pneumonia due to COVID-19 virus   CAP (community acquired pneumonia)   Abnormal liver function   Pressure injury of skin  Acute hypoxic respiratory failure due to covid-19 pneumonia: Reintubated 6/10, febrile overnight. - Continue vent support per PCCM - Continue airborne, contact precautions. PPE including surgical gown, gloves, face shield, cap, shoe covers, and N-95 used during this encounter in a negative pressure room.  - Check daily labs: CBC w/diff, CMP, d-dimer, fibrinogen, ferritin, LDH, CRP - Avoid NSAIDs - s/p actemra 5/27, convalescent plasma 5/29, remdesivir 5/27 - 5/31. - s/p abx x7 days. Added back with suspected aspiration.  Cardiac arrest 6/11-6/12: With suspected perfusion limitation causing bilateral lower extremity arterial insufficiency/ischemia:  - CT head due to possible anoxic brain injury was nonacute.   Lower extremity ischemia:  - Poor prognosis. Discussed with wife that this also bodes poorly for cerebral recovery.  Aspiration/HCAP:  - Continue abx. Cefepime/flagyl changed to zosyn, will continue - Need to continue tube feeds, elevate HOB  Oral candidiasis: - Has been given fluconazole x7 days.  T2DM: HbA1c 9.9%.  - Hold home metformin, glipizide - Continue covering with SSI  Hypotension: BPs trending down, worse with  sedation.  - Neo gtt, wean as able  AFib with RVR:  - Amiodarone gtt to continue for now - Supplement K given hypokalemia and need for further diuresis.  Obesity: BMI: 38. Contributes to poor prognosis - Optimize nutritional status per dietitian  Hypernatremia: Resolved.  Seizure-like activity Nursing noted a few minutes seizure like activity (clonic jerking of both arms, left leg) that were self-limited in a.m. 6/7, in context of tachycardia.  Patient subsequently agitated and moving all extremities.  No focal weakness.   Also, no further posturing, no further seizure like activity today.  Doubt status. CT head 6/7 unremarkable.  No further jerking.  Suspect this was agitation, not seizure.  DVT prophylaxis: Lovenox Code Status: Full Family Communication: Discussed with Niece who translated for his wife and daughters per their request. Phone call was in great depth regarding his current condition, clinical trajectory, poor prognosis. 35 minutes on phone call.  Disposition Plan: Remain in ICU  Consultants:   PCCM  Procedures:   5/29 ETT  5/29 - 6/10 L subclavian central line  6/10 PICC  Antimicrobials:  Azithromycin 5/26 >> 6/2  Cefepime 5/27 >> 6/2 and 6/7 >>  Fluconazole 6/2 >> 6/9  Remdesivir 5/27 >> 5/31  Vancomycin 5/26 >> 5/28  And 6/7 >>  Metronidazole 6/11 >>   Subjective: No events noted.   Objective: Vitals:   06/03/19 0900 06/03/19 1000 06/03/19 1100 06/03/19 1200  BP: 123/75 130/72 133/72 123/68  Pulse: 98 97 100 (!) 108  Resp: (!) 35 (!) 35 (!) 35 (!) 35  Temp:    98.7 F (37.1 C)  TempSrc:    Rectal  SpO2: 93% 92% 90% 90%  Height:  Intake/Output Summary (Last 24 hours) at 06/03/2019 1215 Last data filed at 06/03/2019 1200 Gross per 24 hour  Intake 2187.86 ml  Output 4125 ml  Net -1937.14 ml   Filed Weights   Gen: 63 y.o. male in no distress Pulm: Ventilated, Clear. CV: Regular rate and rhythm. No murmur, rub, or gallop. No  JVD, trace dependent edema. GI: Abdomen soft, non-tender, non-distended, with normoactive bowel sounds.  Ext: Cool, dry. Toes bilaterally are experiencing advancing purple discoloration with no cap refill. DP pulse not able to be palpated or doppler signal. +Doppler signal in PTs bilaterally.  Skin: No new ulcerations.  Neuro: Sedated, unresponsive Psych: UTD  Data Reviewed: I have personally reviewed following labs and imaging studies  CBC: Recent Labs  Lab 05/31/19 0006  05/31/19 0315 05/31/19 1159 06/01/19 0500 06/01/19 0827 06/02/19 0403 06/03/19 0450  WBC 14.3*  --  19.6*  --  8.0  --  9.0 10.2  HGB 9.1*   < > 11.0* 10.9* 8.7* 10.2* 10.6* 11.5*  HCT 30.4*   < > 35.7* 32.0* 27.6* 30.0* 34.2* 37.1*  MCV 107.0*  --  104.4*  --  98.6  --  100.6* 102.2*  PLT 133*  --  186  --  127*  --  160 150   < > = values in this interval not displayed.   Basic Metabolic Panel: Recent Labs  Lab 05/30/19 0430 05/30/19 0815  05/31/19 0006  05/31/19 0315  05/31/19 2047 06/01/19 0500 06/01/19 0827 06/02/19 0403 06/03/19 0450  NA QUESTIONABLE RESULTS, RECOMMEND RECOLLECT TO VERIFY 145   < > 153*   < > 143   < > 153* 151* 151* 148* 146*  K QUESTIONABLE RESULTS, RECOMMEND RECOLLECT TO VERIFY 3.5   < > 3.0*   < > 2.5*   < > 3.1* 2.9* 3.3* 3.7 3.3*  CL QUESTIONABLE RESULTS, RECOMMEND RECOLLECT TO VERIFY 114*  --  118*  --  104  --  113* 97*  --  108 101  CO2 QUESTIONABLE RESULTS, RECOMMEND RECOLLECT TO VERIFY 22  --  25  --  28  --  34* 46*  --  34* 36*  GLUCOSE QUESTIONABLE RESULTS, RECOMMEND RECOLLECT TO VERIFY 91  --  294*  --  417*  --  79 88  --  124* 237*  BUN QUESTIONABLE RESULTS, RECOMMEND RECOLLECT TO VERIFY 16  --  43*  --  36*  --  34* 25*  --  37* 38*  CREATININE QUESTIONABLE RESULTS, RECOMMEND RECOLLECT TO VERIFY 0.52*  --  1.01  --  0.81  --  0.88 0.69  --  0.98 0.81  CALCIUM QUESTIONABLE RESULTS, RECOMMEND RECOLLECT TO VERIFY 6.6*  --  5.5*  --  4.3*  --  6.6* 5.3*  --  7.1*  7.1*  MG QUESTIONABLE RESULTS, RECOMMEND RECOLLECT TO VERIFY 2.1  --  1.9  --   --   --  2.4  --   --  2.2  --   PHOS  --   --   --   --   --   --   --  1.0* 1.3*  --  3.4  --    < > = values in this interval not displayed.   GFR: Estimated Creatinine Clearance: 104.4 mL/min (by C-G formula based on SCr of 0.81 mg/dL). Liver Function Tests: Recent Labs  Lab 05/31/19 0315 05/31/19 2047 06/01/19 0500 06/02/19 0403 06/03/19 0450  AST 205* 101* 72* 129* 95*  ALT 162* 166*  116* 122* 121*  ALKPHOS 198* 243* 194* 570* 539*  BILITOT 0.3 0.3 0.2* 0.7 0.1*  PROT 3.3* 5.1* 4.1* 5.7* 6.2*  ALBUMIN 1.3* 1.9* 1.5* 2.1* 2.1*   No results for input(s): LIPASE, AMYLASE in the last 168 hours. No results for input(s): AMMONIA in the last 168 hours. Coagulation Profile: No results for input(s): INR, PROTIME in the last 168 hours. Cardiac Enzymes: Recent Labs  Lab 05/27/19 2018 05/31/19 0029 05/31/19 0630 05/31/19 1516 06/01/19 0950  TROPONINI <0.03 0.05* 0.05* 0.05* 0.06*   BNP (last 3 results) No results for input(s): PROBNP in the last 8760 hours. HbA1C: No results for input(s): HGBA1C in the last 72 hours. CBG: Recent Labs  Lab 06/02/19 1608 06/02/19 1944 06/03/19 0016 06/03/19 0259 06/03/19 0752  GLUCAP 227* 224* 202* 226* 171*   Lipid Profile: No results for input(s): CHOL, HDL, LDLCALC, TRIG, CHOLHDL, LDLDIRECT in the last 72 hours. Thyroid Function Tests: No results for input(s): TSH, T4TOTAL, FREET4, T3FREE, THYROIDAB in the last 72 hours. Anemia Panel: No results for input(s): VITAMINB12, FOLATE, FERRITIN, TIBC, IRON, RETICCTPCT in the last 72 hours. Urine analysis:    Component Value Date/Time   COLORURINE YELLOW 05/28/2016 2056   APPEARANCEUR CLEAR 05/28/2016 2056   LABSPEC 1.041 (H) 05/28/2016 2056   PHURINE 5.0 05/28/2016 2056   GLUCOSEU >1000 (A) 05/28/2016 2056   HGBUR NEGATIVE 05/28/2016 2056   BILIRUBINUR negative 06/19/2018 1601   KETONESUR negative  06/19/2018 1601   KETONESUR 15 (A) 05/28/2016 2056   PROTEINUR negative 06/19/2018 1601   PROTEINUR NEGATIVE 05/28/2016 2056   UROBILINOGEN 0.2 06/19/2018 1601   NITRITE Negative 06/19/2018 1601   NITRITE NEGATIVE 05/28/2016 2056   LEUKOCYTESUR Negative 06/19/2018 1601   Recent Results (from the past 240 hour(s))  Culture, respiratory (non-expectorated)     Status: None   Collection Time: 05/26/19  8:48 AM   Specimen: Tracheal Aspirate; Respiratory  Result Value Ref Range Status   Specimen Description   Final    TRACHEAL ASPIRATE Performed at [facility], [address] [place], [place] [address]    Special Requests   Final    NONE Performed at [facility], [address], [place], [place] [address]    Gram Stain   Final    FEW WBC PRESENT, PREDOMINANTLY PMN MODERATE GRAM POSITIVE COCCI MODERATE GRAM NEGATIVE RODS MODERATE GRAM POSITIVE RODS    Culture   Final    MODERATE Consistent with normal respiratory flora. Performed at [facility], [address] [place], [place] [address]    Report Status 05/28/2019 FINAL  Final  Culture, blood (routine x 2)     Status: None   Collection Time: 05/26/19  8:49 AM   Specimen: Right Antecubital; Blood  Result Value Ref Range Status   Specimen Description   Final    RIGHT ANTECUBITAL Performed at [facility], [address], [place], [place] [address]    Special Requests   Final    BOTTLES DRAWN AEROBIC ONLY Blood Culture adequate volume Performed at [facility], [address], [place], [place] [address]    Culture   Final    NO GROWTH 5 DAYS Performed at [facility], [address] [place], [place] [address]    Report Status 05/31/2019 FINAL  Final  Culture, blood (routine x 2)     Status: None   Collection Time: 05/26/19  8:54 AM   Specimen: BLOOD RIGHT HAND  Result Value Ref Range Status   Specimen Description   Final    BLOOD  RIGHT HAND Performed at [facility], [address] [place], [place] [address]    Special Requests   Final    BOTTLES DRAWN AEROBIC ONLY Blood Culture adequate volume Performed at [facility], [address] [place], [place] [address]    Culture   Final    NO GROWTH 5 DAYS Performed at [facility], [address] [place], [place] [address]    Report Status 05/31/2019 FINAL  Final  Culture, blood (Routine X 2) w Reflex to ID Panel     Status: None (Preliminary result)   Collection Time: 06/02/19  5:45 AM   Specimen: BLOOD  Result Value Ref Range Status   Specimen Description   Final    BLOOD LEFT ARM Performed at [facility], [address] [place], [place] [address]    Special Requests   Final    BOTTLES DRAWN AEROBIC ONLY Blood Culture adequate volume Performed at [facility], [address] [place], [place] [address]    Culture   Final    NO GROWTH < 12 HOURS Performed at [facility], [address] [place], [place] [address]    Report Status PENDING  Incomplete  Culture, blood (Routine X 2) w Reflex to ID Panel     Status: None (Preliminary result)   Collection Time: 06/02/19  5:50 AM   Specimen: BLOOD  Result Value Ref Range Status   Specimen Description   Final    BLOOD LEFT HAND Performed at [facility], [address] [place], [place] [address]    Special Requests   Final    BOTTLES DRAWN AEROBIC ONLY Blood Culture adequate volume Performed at [facility], [address] [place], [place] [address]    Culture   Final    NO GROWTH < 12 HOURS Performed at [facility], [address] [place], [place] [address]    Report Status PENDING  Incomplete  Culture, respiratory (non-expectorated)     Status: None (Preliminary result)   Collection Time: 06/02/19 10:07 AM   Specimen: Tracheal Aspirate; Respiratory  Result Value  Ref Range Status   Specimen Description   Final    TRACHEAL ASPIRATE Performed at [facility], [address] [place], [place] [address]    Special Requests   Final    NONE Performed at [facility], [address] [place], [place] [address]    Gram Stain   Final    RARE WBC PRESENT, PREDOMINANTLY PMN NO ORGANISMS SEEN    Culture   Final    NO GROWTH < 24 HOURS Performed at [facility], [address] [place], [place] [address]    Report Status PENDING  Incomplete      Radiology Studies: No results found.  Scheduled Meds:  amiodarone  150 mg Intravenous Once   chlorhexidine  15 mL Mouth/Throat BID   Chlorhexidine Gluconate Cloth  6 each Topical Daily   famotidine  20 mg Per Tube BID   feeding supplement (PRO-STAT SUGAR FREE 64)  60 mL Per Tube TID   furosemide  40 mg Intravenous Q12H   guaiFENesin-dextromethorphan  15 mL Per Tube Q6H   insulin aspart  0-20 Units Subcutaneous Q4H   insulin glargine  5 Units Subcutaneous QHS   mouth rinse  15 mL Mouth Rinse 10 times per day   multivitamin  15 mL Per Tube Daily   nutrition supplement (JUVEN)  1 packet Per Tube BID BM   potassium chloride  40 mEq Per Tube TID   sodium chloride flush  10-40 mL Intracatheter Q12H   Continuous Infusions:  sodium chloride Stopped (05/29/19 1741)   amiodarone 30 mg/hr (06/03/19 1200)   feeding supplement (GLUCERNA 1.2 CAL) 30 mL/hr at 06/03/19 1200   heparin 1,000 Units/hr (06/03/19 1200)   HYDROmorphone 5 mg/hr (06/03/19 1200)   midazolam 1 mg/hr (06/03/19 1200)   norepinephrine (LEVOPHED) Adult infusion Stopped (06/01/19 0437)   piperacillin-tazobactam (ZOSYN)  IV Stopped (06/03/19 0737)     LOS: 20 days   Time spent: 35 minutes.  Patrecia Pour, MD [facility] [email or web address] Password [facility] 06/03/2019, 12:15 PM

## 2019-06-03 NOTE — Progress Notes (Signed)
[place] for heparin Indication: atrial fibrillation  No Known Allergies  Patient Measurements: Height: '5\' 5"'  (165.1 cm) Weight: (bed scale incorrect, can't get accurate weight) IBW/kg (Calculated) : 61.5 Heparin Dosing Weight: 85 kg   Vital Signs: Temp: 99.4 F (37.4 C) (06/15 0623) Temp Source: Rectal (06/15 0623) BP: 112/67 (06/15 0630) Pulse Rate: 100 (06/15 0630)  Labs: Recent Labs    05/31/19 1516  06/01/19 0500 06/01/19 0827 06/01/19 0950 06/02/19 0403 06/02/19 1750 06/03/19 0450 06/03/19 0550  HGB  --   --  8.7* 10.2*  --  10.6*  --  11.5*  --   HCT  --   --  27.6* 30.0*  --  34.2*  --  37.1*  --   PLT  --   --  127*  --   --  160  --  150  --   HEPARINUNFRC  --   --   --   --   --   --  1.08* 0.29* 0.32  CREATININE  --    < > 0.69  --   --  0.98  --  0.81  --   TROPONINI 0.05*  --   --   --  0.06*  --   --   --   --    < > = values in this interval not displayed.    Estimated Creatinine Clearance: 104.4 mL/min (by C-G formula based on SCr of 0.81 mg/dL).   Medical History: Past Medical History:  Diagnosis Date  . Cataract   . Diabetes mellitus without complication ([place])   . Hyperlipidemia   . Hypertension   . Pleural plaque 10/05/2015   calcified pleural plaque bilaterally on CT chest    Medications:  Medications Prior to Admission  Medication Sig Dispense Refill Last Dose  . [EXPIRED] benzonatate (TESSALON) 200 MG capsule Take 1 capsule (200 mg total) by mouth 3 (three) times daily as needed for up to 7 days for cough. 28 capsule 0 Past Week at Unknown time  . metFORMIN (GLUCOPHAGE) 1000 MG tablet Take 1 tablet (1,000 mg total) by mouth 2 (two) times daily with a meal. 180 tablet 1 unk  . ondansetron (ZOFRAN) 4 MG tablet Take 1 tablet (4 mg total) by mouth every 8 (eight) hours as needed for nausea or vomiting. 12 tablet 0 Past Week at Unknown time  . ACCU-CHEK AVIVA PLUS test strip Check sugar once daily;  spanish label. 100 each 7   . ACCU-CHEK SOFTCLIX LANCETS lancets Check sugar once daily 100 each 3   . atorvastatin (LIPITOR) 20 MG tablet Take 1 tablet (20 mg total) by mouth daily. 90 tablet 1   . blood glucose meter kit and supplies Dispense based on patient and insurance preference. Use up to four times daily as directed. (FOR ICD-9 250.00, 250.01). 1 each 0   . Blood Glucose Monitoring Suppl (ACCU-CHEK AVIVA PLUS) w/Device KIT      . gabapentin (NEURONTIN) 300 MG capsule Take 1 capsule (300 mg total) by mouth 3 (three) times daily. 90 capsule 1   . glipiZIDE (GLUCOTROL XL) 10 MG 24 hr tablet Take 1 tablet (10 mg total) by mouth daily with breakfast. 90 tablet 1     Assessment: 72 YOM with a prolonged hospital admission currently intubated. He developed Afib and poorly rate controlled on diltiazem. Pharmacy consulted to start IV heparin. H/H low stable, Plt wnl today.   Of note, he has been on lovenox 50  mg BID for VTE prophylaxis and last dose was given around midnight.   Today,  06/03/19  - HL levels are borderline therapeutic at 0.29 and 0.32 - Hgb trending up, plt WNL   - no bleeding per RN  Goal of Therapy:  Heparin level 0.3-0.7 units/ml Monitor platelets by anticoagulation protocol: Yes   Plan:  -W Increase heparin to 1000 units/hr -F/u 6 hr HL after rate change  -Monitor daily HL, CBC and s/s of bleeding    Royetta Asal, PharmD, BCPS 06/03/2019 6:58 AM

## 2019-06-03 NOTE — Progress Notes (Signed)
PROGRESS NOTE  Notified by RT that patient remained hypoxic and this was confirmed by ABG despite increased PEEP and FiO2. On my evaluation he appears to be having some agonal respirations and dyssynchronous with the vent. PCCM also evaluated at bedside.   - Vecuronium to assist with vent synchrony.  - Again called the patient's Niece who brought the patient's wife and daughters on the line. I explained his continued decompensation and that I felt it was appropriate for visitation at this time with concern that he will die in the hospital. They agree to this and to changing code status to DNR. We will, for the time being, continue other medical measures per progress note from today. Spoke with charge RN to contact family and facilitate visitation.  Patrecia Pour, MD Pager [phone] 06/03/2019, 6:52 PM

## 2019-06-04 LAB — POCT I-STAT 7, (LYTES, BLD GAS, ICA,H+H)
Acid-Base Excess: 20 mmol/L — ABNORMAL HIGH (ref 0.0–2.0)
Bicarbonate: 45 mmol/L — ABNORMAL HIGH (ref 20.0–28.0)
Calcium, Ion: 1.13 mmol/L — ABNORMAL LOW (ref 1.15–1.40)
HCT: 31 % — ABNORMAL LOW (ref 39.0–52.0)
Hemoglobin: 10.5 g/dL — ABNORMAL LOW (ref 13.0–17.0)
O2 Saturation: 95 %
Patient temperature: 97.4
Potassium: 3.8 mmol/L (ref 3.5–5.1)
Sodium: 143 mmol/L (ref 135–145)
TCO2: 47 mmol/L — ABNORMAL HIGH (ref 22–32)
pCO2 arterial: 53.2 mmHg — ABNORMAL HIGH (ref 32.0–48.0)
pH, Arterial: 7.532 — ABNORMAL HIGH (ref 7.350–7.450)
pO2, Arterial: 68 mmHg — ABNORMAL LOW (ref 83.0–108.0)

## 2019-06-04 LAB — CULTURE, RESPIRATORY W GRAM STAIN: Culture: NO GROWTH

## 2019-06-04 LAB — GLUCOSE, CAPILLARY
Glucose-Capillary: 143 mg/dL — ABNORMAL HIGH (ref 70–99)
Glucose-Capillary: 147 mg/dL — ABNORMAL HIGH (ref 70–99)
Glucose-Capillary: 166 mg/dL — ABNORMAL HIGH (ref 70–99)
Glucose-Capillary: 183 mg/dL — ABNORMAL HIGH (ref 70–99)
Glucose-Capillary: 187 mg/dL — ABNORMAL HIGH (ref 70–99)
Glucose-Capillary: 220 mg/dL — ABNORMAL HIGH (ref 70–99)
Glucose-Capillary: 270 mg/dL — ABNORMAL HIGH (ref 70–99)

## 2019-06-04 LAB — COMPREHENSIVE METABOLIC PANEL
ALT: 92 U/L — ABNORMAL HIGH (ref 0–44)
AST: 74 U/L — ABNORMAL HIGH (ref 15–41)
Albumin: 2.2 g/dL — ABNORMAL LOW (ref 3.5–5.0)
Alkaline Phosphatase: 530 U/L — ABNORMAL HIGH (ref 38–126)
Anion gap: 12 (ref 5–15)
BUN: 48 mg/dL — ABNORMAL HIGH (ref 8–23)
CO2: 36 mmol/L — ABNORMAL HIGH (ref 22–32)
Calcium: 7.5 mg/dL — ABNORMAL LOW (ref 8.9–10.3)
Chloride: 101 mmol/L (ref 98–111)
Creatinine, Ser: 0.81 mg/dL (ref 0.61–1.24)
GFR calc Af Amer: >60 mL/min (ref >60)
GFR calc non Af Amer: >60 mL/min (ref >60)
Glucose, Bld: 210 mg/dL — ABNORMAL HIGH (ref 70–99)
Potassium: 3.9 mmol/L (ref 3.5–5.1)
Sodium: 149 mmol/L — ABNORMAL HIGH (ref 135–145)
Total Bilirubin: 0.2 mg/dL — ABNORMAL LOW (ref 0.3–1.2)
Total Protein: 6.6 g/dL (ref 6.5–8.1)

## 2019-06-04 LAB — CBC
HCT: 37.2 % — ABNORMAL LOW (ref 39.0–52.0)
Hemoglobin: 11.4 g/dL — ABNORMAL LOW (ref 13.0–17.0)
MCH: 32 pg (ref 26.0–34.0)
MCHC: 30.6 g/dL (ref 30.0–36.0)
MCV: 104.5 fL — ABNORMAL HIGH (ref 80.0–100.0)
Platelets: 142 10*3/uL — ABNORMAL LOW (ref 150–400)
RBC: 3.56 MIL/uL — ABNORMAL LOW (ref 4.22–5.81)
RDW: 16.7 % — ABNORMAL HIGH (ref 11.5–15.5)
WBC: 10 10*3/uL (ref 4.0–10.5)
nRBC: 0.9 % — ABNORMAL HIGH (ref 0.0–0.2)

## 2019-06-04 LAB — HEPARIN LEVEL (UNFRACTIONATED): Heparin Unfractionated: 0.36 IU/mL (ref 0.30–0.70)

## 2019-06-04 MED ORDER — COLLAGENASE 250 UNIT/GM EX OINT
TOPICAL_OINTMENT | Freq: Every day | CUTANEOUS | Status: AC
  Administered 2019-06-04 – 2019-06-07 (×5): via TOPICAL
  Administered 2019-06-08 – 2019-06-09 (×2): 1 via TOPICAL
  Filled 2019-06-04: qty 30

## 2019-06-04 MED ORDER — AMIODARONE IV BOLUS ONLY 150 MG/100ML: 150.0000 mg | Freq: Once | INTRAVENOUS | Status: DC

## 2019-06-04 MED ORDER — VITAL AF 1.2 CAL PO LIQD
1000.0000 mL | ORAL | Status: DC
  Administered 2019-06-04 – 2019-06-09 (×7): 1000 mL

## 2019-06-04 MED ORDER — PRO-STAT SUGAR FREE PO LIQD
60.0000 mL | Freq: Two times a day (BID) | ORAL | Status: DC
  Administered 2019-06-04 – 2019-06-09 (×11): 60 mL
  Filled 2019-06-04 (×11): qty 60

## 2019-06-04 NOTE — Progress Notes (Signed)
Nutrition Follow-up RD working remotely.  DOCUMENTATION CODES:   Obesity unspecified  INTERVENTION:   Change TF formula since Glucerna 1.2 is on backorder.   Vital AF 1.2 at 35 ml/h (840 ml per day)   Pro-stat 60 ml BID   Provides 1408 kcal, 123 gm protein, 681 ml free water, 93 gm carbohydrates daily  Continue Juven BID via tube, each packet provides 80 calories, 8 grams of carbohydrate, 2.5 grams of protein (collagen), 7 grams of L-arginine and 7 grams of L-glutamine; supplement contains CaHMB, Vitamins C, E, B12 and Zinc to promote wound healing  NUTRITION DIAGNOSIS:   Increased nutrient needs related to acute illness(COVID-19) as evidenced by estimated needs.  Ongoing  GOAL:   Provide needs based on ASPEN/SCCM guidelines  Met with TF  MONITOR:   Vent status, Labs, Skin, I & O's  ASSESSMENT:   63 yo Spanish speaking male with PMH of DM, HTN, HLD who was admitted with SOB and cough x 1 week. COVID-19 positive on 5/21.  OG tube in place. Patient is currently receiving Glucerna 1.2 at 30 ml/h with Pro-stat 60 ml TID. Per Pharmacy, Glucerna 1.2 1 L bottles are on back order. RD to change TF formula.  Patient remains intubated on ventilator support MV: 14.8 L/min Temp (24hrs), Avg:98.3 F (36.8 C), Min:97.4 F (36.3 C), Max:99.3 F (37.4 C)   Labs reviewed. CBG's: [phone]  Medications reviewed and include Lasix, Novolog, Lantus, MVI, Juven.    No new weight available. Bed is not providing correct weights per RN documentation.   Per review of progress notes, patient is not improving, but family is not ready for transition to comfort care at this time.  Diet Order:   Diet Order            Diet NPO time specified  Diet effective now              EDUCATION NEEDS:   No education needs have been identified at this time  Skin:  Skin Assessment: Skin Integrity Issues: Skin Integrity Issues:: Stage II, Unstageable Stage II: R  buttocks Unstageable: L buttocks Other: MASD to buttocks, skin tear to sacrum, blister to buttocks  Last BM:  6/16 (type 7)  Height:   Ht Readings from Last 1 Encounters:  05/17/2019 '5\' 5"'  (1.651 m)    Weight:   Wt Readings from Last 1 Encounters:  05/08/19 75.8 kg    Ideal Body Weight:  61.8 kg  BMI:  Body mass index is 38.74 kg/m.  Estimated Nutritional Needs:   Kcal:  1160-1480  Protein:  124 gm  Fluid:  2 L    Molli Barrows, RD, LDN, [place] Pager [phone] After Hours Pager [phone]

## 2019-06-04 NOTE — Progress Notes (Signed)
NAME:  Lance Mason, MRN:  [id number], DOB:  [birth year], LOS: 21 ADMISSION DATE:  2019-06-01, CONSULTATION DATE:  May 15, 2019 REFERRING MD:  Roderic Palau, CHIEF COMPLAINT:  Dyspnea   Brief History   63 year old male with type 2 diabetes, hypertension admitted on May 26 with acute respiratory failure with hypoxemia due to COVID-19 pneumonia.   Past Medical History  Hypertension Hyperlipidemia DM 2  Significant Hospital Events   May 26 admission May 29 intubation, mechanical ventilation in setting of profound encephalopathy May 31 worsening dyssynchrony, hypoxemia, added vecuronium June 1 chemical paralysis used overnight  June 2-5 weaning FiO2/PEEP June 6 weaning well on pressure support June 7 worsening oxygenation/tachycardia fever, started on antibiotics again June 9 following commands, weaning, extubated June 10 vomited, hypoxemic, re-intubated June 12 cardiac arrest. Stopped proning due to isntability June 14 Atrial fibrillation, worsening ventialtor settings June 15 made DNR  Consults:  Pulmonary and critical care medicine  Procedures:  Endotracheal tube May 29> June 9, June 10 >> Left subclavian central venous line May 29> June 10 June 10 PICC>   Significant Diagnostic Tests:  CT angiogram chest May 27 diffuse bilateral patchy airspace disease groundglass upper lobes predominant, no pulmonary embolism June 1 bilateral lower ext doppler > neg DVT June 1 TTE >>>LVEF > 65%, RV normal but poorly visualized CT head 6/7 > NAICP CT chest 6/8 > No PE, findings consistent with viral pneumonia, increased RLL consolidation, reactive subcarinal lymph node  Micro Data:  May 21 SARS-CoV-2 positive May 26 blood cultures June 7 resp culture > GPC, GNR, GPR > OPF June 7 blood culture > Neg  June 14 Blood cx >> June 14 Sputum Cx >>  Antimicrobials:  May 26 azithromycin > June 2 May 27 cefepime > June 2 May 27 vancomycin > May 31 June 2 Fluconazole >  May 27 remdesivir  May 27 Tocilizumab x1 5/27 May 29 Convalescent plasma June 7 Vanc > 6/9 June 7 Cefepime > 6/9, restart 6/10 > 6/12 June 10 Flagyl > 6/12  Zosyn 6/12 >>  Interim history/subjective:  Worsening ventilator needs overnight Oxygenation lower Feet remain cyanotic, borderline gangrenous  Objective   Blood pressure 103/63, pulse 94, temperature (!) 97.4 F (36.3 C), temperature source Rectal, resp. rate (!) 32, height 5\' 5"  (1.651 m), weight 105.6 kg, SpO2 95 %.    Vent Mode: PRVC FiO2 (%):  [65 %-100 %] 75 % Set Rate:  [35 bmp] 35 bmp Vt Set:  [430 mL] 430 mL PEEP:  [16 cmH20-20 cmH20] 20 cmH20 Plateau Pressure:  [25 cmH20-51 cmH20] 51 cmH20   Intake/Output Summary (Last 24 hours) at 06/04/2019 1035 Last data filed at 06/04/2019 0900 Gross per 24 hour  Intake 2116.29 ml  Output 3850 ml  Net -1733.71 ml   Filed Weights    Examination:  General:  In bed on vent HENT: NCAT ETT in place PULM: CTA B, vent supported breathing CV: RRR, no mgr GI: BS+, soft, nontender MSK: normal bulk and tone Derm: cyanotic feet bilaterally in toes, cool to touch; edema in hands, legs Neuro: sedated on vent    Resolved Hospital Problem list     Assessment & Plan:  ARDS due to COVID pneumonia Continue ARDS ventilator settings, targeting tidal volume 6 to 8 cc/kg ideal body weight, keep plateau pressure less than 30 if able, unable to do that on June 16 due to worsening lung function Continue high sedation per PAD protocol Hold pronating given recent cardiac arrest with prone positioning  Wean PEEP and FiO2 to maintain SaO2 greater than 88%  Aspiration/healthcare associated pneumonia: Continue Zosyn  Atrial fibrillation: Telemetry monitoring Continue amiodarone, heparin  Need for sedation/ventilator synchrony ICU delirium Continue quetiapine Monitor QTc interval RA SS goal -2  Overall prognosis: Poor.  I had a lengthy conversation with the patient's family today and explained  that the best case scenario would be that he would end up with bilateral amputations and in a nursing home.  They do not believe that he would want this.  However, when I asked them if they were ready to stop the ventilator and proceed with full comfort measures they said no.  They do want us to target comfort but they are not ready for us to stop full supportive measures.  They do agree that he should not have CPR in the event of a cardiac arrest.  Best practice:  Diet: tube feeding Pain/Anxiety/Delirium protocol (if indicated): dilaudid, versed infusion, RASS goal -2 VAP protocol (if indicated): yes DVT prophylaxis: heparin infusion GI prophylaxis: famotidine Glucose control: SSI, Lantus  Mobility: passive range of motion in bed Code Status: Full Family Communication: I updated the family at length today, see my note above Disposition: remain in ICU  Labs   CBC: Recent Labs  Lab 05/31/19 0315  06/01/19 0500  06/02/19 0403 06/03/19 0450 06/03/19 1707 06/04/19 0130 06/04/19 0504  WBC 19.6*  --  8.0  --  9.0 10.2  --  10.0  --   HGB 11.0*   < > 8.7*   < > 10.6* 11.5* 11.2* 11.4* 10.5*  HCT 35.7*   < > 27.6*   < > 34.2* 37.1* 33.0* 37.2* 31.0*  MCV 104.4*  --  98.6  --  100.6* 102.2*  --  104.5*  --   PLT 186  --  127*  --  160 150  --  142*  --    < > = values in this interval not displayed.    Basic Metabolic Panel: Recent Labs  Lab 05/30/19 0430 05/30/19 0815  05/31/19 0006  05/31/19 2047 06/01/19 0500  06/02/19 0403 06/03/19 0450 06/03/19 1707 06/04/19 0130 06/04/19 0504  NA QUESTIONABLE RESULTS, RECOMMEND RECOLLECT TO VERIFY 145   < > 153*   < > 153* 151*   < > 148* 146* 148* 149* 143  K QUESTIONABLE RESULTS, RECOMMEND RECOLLECT TO VERIFY 3.5   < > 3.0*   < > 3.1* 2.9*   < > 3.7 3.3* 4.0 3.9 3.8  CL QUESTIONABLE RESULTS, RECOMMEND RECOLLECT TO VERIFY 114*  --  118*   < > 113* 97*  --  108 101  --  101  --   CO2 QUESTIONABLE RESULTS, RECOMMEND RECOLLECT TO VERIFY 22   --  25   < > 34* 46*  --  34* 36*  --  36*  --   GLUCOSE QUESTIONABLE RESULTS, RECOMMEND RECOLLECT TO VERIFY 91  --  294*   < > 79 88  --  124* 237*  --  210*  --   BUN QUESTIONABLE RESULTS, RECOMMEND RECOLLECT TO VERIFY 16  --  43*   < > 34* 25*  --  37* 38*  --  48*  --   CREATININE QUESTIONABLE RESULTS, RECOMMEND RECOLLECT TO VERIFY 0.52*  --  1.01   < > 0.88 0.69  --  0.98 0.81  --  0.81  --   CALCIUM QUESTIONABLE RESULTS, RECOMMEND RECOLLECT TO VERIFY 6.6*  --  5.5*   < >  6.6* 5.3*  --  7.1* 7.1*  --  7.5*  --   MG QUESTIONABLE RESULTS, RECOMMEND RECOLLECT TO VERIFY 2.1  --  1.9  --  2.4  --   --  2.2  --   --   --   --   PHOS  --   --   --   --   --  1.0* 1.3*  --  3.4  --   --   --   --    < > = values in this interval not displayed.   GFR: Estimated Creatinine Clearance: 104.4 mL/min (by C-G formula based on SCr of 0.81 mg/dL). Recent Labs  Lab 05/30/19 0430 05/30/19 0815  05/31/19 0705 05/31/19 1517 06/01/19 0500 06/01/19 0950 06/02/19 0403 06/03/19 0450 06/04/19 0130  PROCALCITON QUESTIONABLE RESULTS, RECOMMEND RECOLLECT TO VERIFY 0.78  --   --   --   --   --   --   --   --   WBC QUESTIONABLE RESULTS, RECOMMEND RECOLLECT TO VERIFY  --    < >  --   --  8.0  --  9.0 10.2 10.0  LATICACIDVEN  --   --   --  4.0* 2.6*  --  1.4  --   --   --    < > = values in this interval not displayed.    Liver Function Tests: Recent Labs  Lab 05/31/19 2047 06/01/19 0500 06/02/19 0403 06/03/19 0450 06/04/19 0130  AST 101* 72* 129* 95* 74*  ALT 166* 116* 122* 121* 92*  ALKPHOS 243* 194* 570* 539* 530*  BILITOT 0.3 0.2* 0.7 0.1* 0.2*  PROT 5.1* 4.1* 5.7* 6.2* 6.6  ALBUMIN 1.9* 1.5* 2.1* 2.1* 2.2*   No results for input(s): LIPASE, AMYLASE in the last 168 hours. No results for input(s): AMMONIA in the last 168 hours.  ABG    Component Value Date/Time   PHART 7.532 (H) 06/04/2019 0504   PCO2ART 53.2 (H) 06/04/2019 0504   PO2ART 68.0 (L) 06/04/2019 0504   HCO3 45.0 (H) 06/04/2019  0504   TCO2 47 (H) 06/04/2019 0504   ACIDBASEDEF 1.0 05/31/2019 0200   O2SAT 95.0 06/04/2019 0504     Coagulation Profile: No results for input(s): INR, PROTIME in the last 168 hours.  Cardiac Enzymes: Recent Labs  Lab 05/31/19 0029 05/31/19 0630 05/31/19 1516 06/01/19 0950  TROPONINI 0.05* 0.05* 0.05* 0.06*    HbA1C: Hemoglobin A1C  Date/Time Value Ref Range Status  06/19/2018 04:01 PM 9.1 (A) 4.0 - 5.6 % Final  01/02/2017 06:51 PM 12.8  Final   Hgb A1c MFr Bld  Date/Time Value Ref Range Status  05/23/2019 02:20 AM 9.9 (H) 4.8 - 5.6 % Final    Comment:    (NOTE) Pre diabetes:          5.7%-6.4% Diabetes:              >6.4% Glycemic control for   <7.0% adults with diabetes     CBG: Recent Labs  Lab 06/03/19 1534 06/03/19 1923 06/04/19 0007 06/04/19 0335 06/04/19 0739  GLUCAP 131* 142* 187* 166* 143*       Critical care time: 35 minutes     Heber CarolinaBrent McQuaid, MD [place] [facility] Pager: [phone] Cell: [phone] If no response, call [phone]

## 2019-06-04 NOTE — Progress Notes (Signed)
PROGRESS NOTE  Lance Mason  ZDG:[id number] DOB: 09-24-1956 DOA: 05/04/2019 PCP: Patient, No Pcp Per   Brief Narrative: Lance Mason is a 63 y.o. male with a history of T2DM, HTN, obesity who presented with cough and shortness of breath and fever found to be hypoxic requiring NRB. CXR demonstrated bilateral opacities and he was admitted to [facility] for covid-19 pneumonia, ultimately intubated.  Assessment & Plan: Principal Problem:   Acute respiratory failure with hypoxemia (HCC) Active Problems:   Type 2 diabetes mellitus without complication, without long-term current use of insulin (HCC)   Pure hypercholesterolemia   Pneumonia due to COVID-19 virus   CAP (community acquired pneumonia)   Abnormal liver function   Pressure injury of skin  Acute hypoxic respiratory failure due to covid-19 pneumonia: Reintubated 6/10, febrile overnight. - Continue vent support per PCCM - Continue airborne, contact precautions. PPE including surgical gown, gloves, face shield, cap, shoe covers, and N-95 used during this encounter in a negative pressure room.  - Check daily labs: CBC w/diff, CMP, d-dimer, fibrinogen, ferritin, LDH, CRP - Avoid NSAIDs - s/p actemra 5/27, convalescent plasma 5/29, remdesivir 5/27 - 5/31. - s/p abx x7 days. Added back with suspected aspiration.  Cardiac arrest 6/11-6/12: With suspected perfusion limitation causing bilateral lower extremity arterial insufficiency/ischemia:  - CT head due to possible anoxic brain injury was nonacute.   Lower extremity ischemia: Anticipate this will develop into necrosis and ultimately require bilateral amputations.  - Poor prognosis. Discussed with wife that this also bodes poorly for cerebral recovery.  Aspiration/HCAP:  - Continue abx. Cefepime/flagyl changed to zosyn, will continue - Need to continue tube feeds, elevate HOB  Oral candidiasis: - Has been given fluconazole x7 days.  T2DM: HbA1c 9.9%.  - Hold home  metformin, glipizide - Continue covering with SSI  Hypotension: BPs trending down, worse with sedation.  - Neo gtt, wean as able  AFib with RVR:  - Amiodarone gtt to continue for now - Keep K and Mg wnl  Obesity: BMI: 38. Contributes to poor prognosis - Optimize nutritional status per dietitian  Hypernatremia: Resolved.  Seizure-like activity Nursing noted a few minutes seizure like activity (clonic jerking of both arms, left leg) that were self-limited in a.m. 6/7, in context of tachycardia.  Patient subsequently agitated and moving all extremities.  No focal weakness.   Also, no further posturing, no further seizure like activity today.  Doubt status. CT head 6/7 unremarkable.  No further jerking.  Suspect this was agitation, not seizure.  Stage II pressure injury to left and right buttocks:  - Noted by RN to not be present on arrival.  - WOC was consulted. Evaluation is pending. WOC RN is working with leadership to remove barriers to optimal patient care. Recommended that nursing staff place pictures in the record, though I defer to their work flow.    DVT prophylaxis: Lovenox Code Status: Full Family Communication: Due to worsening ventilator needs/hypoxia and progressive lower extremity cyanosis/impending gangrene indicating poor prognosis, family was contacted 6/15 agreeing to DNR status. PCCM discussed with family 6/16 and the family wishes to continue current measures, no escalation of care.   Disposition Plan: Remain in ICU  Consultants:   PCCM  Procedures:   5/29 ETT  5/29 - 6/10 L subclavian central line  6/10 PICC  Antimicrobials:  Azithromycin 5/26 >> 6/2  Cefepime 5/27 >> 6/2 and 6/7 >>  Fluconazole 6/2 >> 6/9  Remdesivir 5/27 >> 5/31  Vancomycin 5/26 >> 5/28  And  6/7 >> 6/11  Metronidazole 6/11   Zosyn 6/12 >>  Subjective: Appeared to have agonal breathing yesterday, worsening hypoxia. Family facetimed with patient last night in lieu of in person  visitation.   Objective: Vitals:   06/04/19 1100 06/04/19 1130 06/04/19 1200 06/04/19 1230  BP: 113/62 109/65 105/61 (!) 100/59  Pulse: (!) 102 (!) 102 (!) 102 (!) 103  Resp: (!) 21 (!) 30 (!) 27 (!) 22  Temp:   99.2 F (37.3 C)   TempSrc:   Rectal   SpO2: 92% 91% 92% 90%  Height:        Intake/Output Summary (Last 24 hours) at 06/04/2019 1424 Last data filed at 06/04/2019 1203 Gross per 24 hour  Intake 2031.03 ml  Output 3425 ml  Net -1393.97 ml   Gen: 63 y.o. male in no distress Pulm: Ventilated. Clear. CV: Regular rate and rhythm. No murmur, rub, or gallop. No JVD, no dependent edema. GI: Abdomen soft, non-tender, non-distended, with normoactive bowel sounds.  Ext: Warm, no deformities Skin: Increasing purple discoloration to bilateral toes, no cap refill, cold. No other rashes, lesions or ulcers on visualized skin. Neuro: Sedated. Psych: UTD.  Data Reviewed: I have personally reviewed following labs and imaging studies  CBC: Recent Labs  Lab 05/31/19 0315  06/01/19 0500  06/02/19 0403 06/03/19 0450 06/03/19 1707 06/04/19 0130 06/04/19 0504  WBC 19.6*  --  8.0  --  9.0 10.2  --  10.0  --   HGB 11.0*   < > 8.7*   < > 10.6* 11.5* 11.2* 11.4* 10.5*  HCT 35.7*   < > 27.6*   < > 34.2* 37.1* 33.0* 37.2* 31.0*  MCV 104.4*  --  98.6  --  100.6* 102.2*  --  104.5*  --   PLT 186  --  127*  --  160 150  --  142*  --    < > = values in this interval not displayed.   Basic Metabolic Panel: Recent Labs  Lab 05/30/19 0430 05/30/19 0815  05/31/19 0006  05/31/19 2047 06/01/19 0500  06/02/19 0403 06/03/19 0450 06/03/19 1707 06/04/19 0130 06/04/19 0504  NA QUESTIONABLE RESULTS, RECOMMEND RECOLLECT TO VERIFY 145   < > 153*   < > 153* 151*   < > 148* 146* 148* 149* 143  K QUESTIONABLE RESULTS, RECOMMEND RECOLLECT TO VERIFY 3.5   < > 3.0*   < > 3.1* 2.9*   < > 3.7 3.3* 4.0 3.9 3.8  CL QUESTIONABLE RESULTS, RECOMMEND RECOLLECT TO VERIFY 114*  --  118*   < > 113* 97*  --   108 101  --  101  --   CO2 QUESTIONABLE RESULTS, RECOMMEND RECOLLECT TO VERIFY 22  --  25   < > 34* 46*  --  34* 36*  --  36*  --   GLUCOSE QUESTIONABLE RESULTS, RECOMMEND RECOLLECT TO VERIFY 91  --  294*   < > 79 88  --  124* 237*  --  210*  --   BUN QUESTIONABLE RESULTS, RECOMMEND RECOLLECT TO VERIFY 16  --  43*   < > 34* 25*  --  37* 38*  --  48*  --   CREATININE QUESTIONABLE RESULTS, RECOMMEND RECOLLECT TO VERIFY 0.52*  --  1.01   < > 0.88 0.69  --  0.98 0.81  --  0.81  --   CALCIUM QUESTIONABLE RESULTS, RECOMMEND RECOLLECT TO VERIFY 6.6*  --  5.5*   < > 6.6*  5.3*  --  7.1* 7.1*  --  7.5*  --   MG QUESTIONABLE RESULTS, RECOMMEND RECOLLECT TO VERIFY 2.1  --  1.9  --  2.4  --   --  2.2  --   --   --   --   PHOS  --   --   --   --   --  1.0* 1.3*  --  3.4  --   --   --   --    < > = values in this interval not displayed.   GFR: Estimated Creatinine Clearance: 104.4 mL/min (by C-G formula based on SCr of 0.81 mg/dL). Liver Function Tests: Recent Labs  Lab 05/31/19 2047 06/01/19 0500 06/02/19 0403 06/03/19 0450 06/04/19 0130  AST 101* 72* 129* 95* 74*  ALT 166* 116* 122* 121* 92*  ALKPHOS 243* 194* 570* 539* 530*  BILITOT 0.3 0.2* 0.7 0.1* 0.2*  PROT 5.1* 4.1* 5.7* 6.2* 6.6  ALBUMIN 1.9* 1.5* 2.1* 2.1* 2.2*   No results for input(s): LIPASE, AMYLASE in the last 168 hours. No results for input(s): AMMONIA in the last 168 hours. Coagulation Profile: No results for input(s): INR, PROTIME in the last 168 hours. Cardiac Enzymes: Recent Labs  Lab 05/31/19 0029 05/31/19 0630 05/31/19 1516 06/01/19 0950  TROPONINI 0.05* 0.05* 0.05* 0.06*   BNP (last 3 results) No results for input(s): PROBNP in the last 8760 hours. HbA1C: No results for input(s): HGBA1C in the last 72 hours. CBG: Recent Labs  Lab 06/03/19 1923 06/04/19 0007 06/04/19 0335 06/04/19 0739 06/04/19 1143  GLUCAP 142* 187* 166* 143* 183*   Lipid Profile: No results for input(s): CHOL, HDL, LDLCALC, TRIG,  CHOLHDL, LDLDIRECT in the last 72 hours. Thyroid Function Tests: No results for input(s): TSH, T4TOTAL, FREET4, T3FREE, THYROIDAB in the last 72 hours. Anemia Panel: No results for input(s): VITAMINB12, FOLATE, FERRITIN, TIBC, IRON, RETICCTPCT in the last 72 hours. Urine analysis:    Component Value Date/Time   COLORURINE YELLOW 05/28/2016 2056   APPEARANCEUR CLEAR 05/28/2016 2056   LABSPEC 1.041 (H) 05/28/2016 2056   PHURINE 5.0 05/28/2016 2056   GLUCOSEU >1000 (A) 05/28/2016 2056   HGBUR NEGATIVE 05/28/2016 2056   BILIRUBINUR negative 06/19/2018 1601   KETONESUR negative 06/19/2018 1601   KETONESUR 15 (A) 05/28/2016 2056   PROTEINUR negative 06/19/2018 1601   PROTEINUR NEGATIVE 05/28/2016 2056   UROBILINOGEN 0.2 06/19/2018 1601   NITRITE Negative 06/19/2018 1601   NITRITE NEGATIVE 05/28/2016 2056   LEUKOCYTESUR Negative 06/19/2018 1601   Recent Results (from the past 240 hour(s))  Culture, respiratory (non-expectorated)     Status: None   Collection Time: 05/26/19  8:48 AM   Specimen: Tracheal Aspirate; Respiratory  Result Value Ref Range Status   Specimen Description   Final    TRACHEAL ASPIRATE Performed at [facility], [address] [place], [place] [address]    Special Requests   Final    NONE Performed at [facility], [address] [place], [place] [address]    Gram Stain   Final    FEW WBC PRESENT, PREDOMINANTLY PMN MODERATE GRAM POSITIVE COCCI MODERATE GRAM NEGATIVE RODS MODERATE GRAM POSITIVE RODS    Culture   Final    MODERATE Consistent with normal respiratory flora. Performed at [facility], [address] [place], [place] [address]    Report Status 05/28/2019 FINAL  Final  Culture, blood (routine x 2)     Status: None  Collection Time: 05/26/19  8:49 AM   Specimen: Right Antecubital; Blood  Result Value Ref Range Status   Specimen Description   Final    RIGHT ANTECUBITAL Performed at [facility], [address] [place], [place] [address]    Special Requests   Final    BOTTLES DRAWN AEROBIC ONLY Blood Culture adequate volume Performed at [facility], [address] [place], [place] [address]    Culture   Final    NO GROWTH 5 DAYS Performed at [facility], [address] [place], [place] [address]    Report Status 05/31/2019 FINAL  Final  Culture, blood (routine x 2)     Status: None   Collection Time: 05/26/19  8:54 AM   Specimen: BLOOD RIGHT HAND  Result Value Ref Range Status   Specimen Description   Final    BLOOD RIGHT HAND Performed at [facility], [address] [place], [place] [address]    Special Requests   Final    BOTTLES DRAWN AEROBIC ONLY Blood Culture adequate volume Performed at [facility], [address] [place], [place] [address]    Culture   Final    NO GROWTH 5 DAYS Performed at [facility], [address] [place], [place] [address]    Report Status 05/31/2019 FINAL  Final  Culture, blood (Routine X 2) w Reflex to ID Panel     Status: None (Preliminary result)   Collection Time: 06/02/19  5:45 AM   Specimen: BLOOD  Result Value Ref Range Status   Specimen Description   Final    BLOOD LEFT ARM Performed at [facility], [address] [place], [place] [address]    Special Requests   Final    BOTTLES DRAWN AEROBIC ONLY Blood Culture adequate volume Performed at [facility], [address] [place], [place] [address]    Culture   Final    NO GROWTH 2 DAYS Performed at [facility], [address] [place], [place] [address]    Report Status PENDING  Incomplete  Culture, blood (Routine X 2) w Reflex to ID Panel     Status: None (Preliminary result)   Collection Time: 06/02/19  5:50 AM   Specimen: BLOOD  Result Value Ref Range Status   Specimen Description   Final    BLOOD LEFT HAND Performed at  [facility], [address] [place], [place] [address]    Special Requests   Final    BOTTLES DRAWN AEROBIC ONLY Blood Culture adequate volume Performed at [facility], [address] [place], [place] [address]    Culture   Final    NO GROWTH 2 DAYS Performed at [facility], [address] [place], [place] [address]    Report Status PENDING  Incomplete  Culture, respiratory (non-expectorated)     Status: None (Preliminary result)   Collection Time: 06/02/19 10:07 AM   Specimen: Tracheal Aspirate; Respiratory  Result Value Ref Range Status   Specimen Description   Final    TRACHEAL ASPIRATE Performed at [facility], [address] [place], [place] [address]    Special Requests   Final    NONE Performed at [facility], [address] [place], [place] [address]    Gram Stain   Final    RARE WBC PRESENT, PREDOMINANTLY PMN NO ORGANISMS SEEN  Culture   Final    NO GROWTH 2 DAYS Performed at [facility], [address], [place], [place] [address]    Report Status PENDING  Incomplete      Radiology Studies: No results found.  Scheduled Meds: . amiodarone  150 mg Intravenous Once  . chlorhexidine  15 mL Mouth/Throat BID  . Chlorhexidine Gluconate Cloth  6 each Topical Daily  . famotidine  20 mg Per Tube BID  . feeding supplement (PRO-STAT SUGAR FREE 64)  60 mL Per Tube BID  . feeding supplement (VITAL AF 1.2 CAL)  1,000 mL Per Tube Q24H  . furosemide  40 mg Intravenous Q12H  . guaiFENesin-dextromethorphan  15 mL Per Tube Q6H  . insulin aspart  0-20 Units Subcutaneous Q4H  . insulin glargine  5 Units Subcutaneous QHS  . mouth rinse  15 mL Mouth Rinse 10 times per day  . multivitamin  15 mL Per Tube Daily  . nutrition supplement (JUVEN)  1 packet Per Tube BID BM  . sodium chloride flush  10-40 mL Intracatheter Q12H   Continuous Infusions: . sodium chloride Stopped (05/29/19  1741)  . amiodarone 30 mg/hr (06/04/19 1200)  . heparin 1,150 Units/hr (06/04/19 1200)  . HYDROmorphone 5 mg/hr (06/04/19 1200)  . midazolam 1 mg/hr (06/04/19 1200)  . norepinephrine (LEVOPHED) Adult infusion Stopped (06/01/19 0437)  . piperacillin-tazobactam (ZOSYN)  IV 3.375 g (06/04/19 1203)     LOS: 21 days   Time spent: 35 minutes.  Tyrone Nine, MD [facility] [email or web address] [facility] 06/04/2019, 2:24 PM

## 2019-06-04 NOTE — Progress Notes (Signed)
Spoke with patient's niece and updated her so that she can translate that back to his wife and family. At this time the wife is requesting that no one be able to visit him if it is allowed. Will speak with niece again around noon once the doctor's have rounded on pt.

## 2019-06-04 NOTE — Progress Notes (Signed)
Spoke with pt's niece and updated her on how the pt was doing since last speaking with her this AM.

## 2019-06-04 NOTE — Progress Notes (Signed)
ANTICOAGULATION CONSULT NOTE - Follow Up Consult  Pharmacy Consult for Heparin IV Indication: atrial fibrillation  No Known Allergies  Patient Measurements: Height: 5\' 5"  (165.1 cm) Weight: (bed scale incorrect, can't get accurate weight) IBW/kg (Calculated) : 61.5 Heparin Dosing Weight: 85 kg  Vital Signs: Temp: 99.3 F (37.4 C) (06/16 0000) Temp Source: Rectal (06/16 0000) BP: 137/78 (06/16 0300) Pulse Rate: 93 (06/16 0300)  Labs: Recent Labs    06/01/19 0500  06/01/19 0950 06/02/19 0403  06/03/19 0450 06/03/19 0550 06/03/19 1600 06/03/19 1707 06/04/19 0130  HGB 8.7*   < >  --  10.6*  --  11.5*  --   --  11.2* 11.4*  HCT 27.6*   < >  --  34.2*  --  37.1*  --   --  33.0* 37.2*  PLT 127*  --   --  160  --  150  --   --   --  142*  HEPARINUNFRC  --   --   --   --    < > 0.29* 0.32 0.23*  --  0.36  CREATININE 0.69  --   --  0.98  --  0.81  --   --   --   --   TROPONINI  --   --  0.06*  --   --   --   --   --   --   --    < > = values in this interval not displayed.    Estimated Creatinine Clearance: 104.4 mL/min (by C-G formula based on SCr of 0.81 mg/dL).    Assessment: 74 yoM admitted on 5/27 with COVID-19 hypoxic respiratory failure, now with a prolonged hospitalization and currently intubated. He developed Afib and poorly rate controlled on diltiazem, now on Amio IV. Pharmacy consulted to start IV heparin. VTE prophylaxis this admission was Lovenox 50 mg BID; 5/27 - 6/14  Today, 06/04/2019:  Heparin level therapeutic (0.36) on 1150 units/hr  Hgb increased to 11.2, Plt down to 142  No bleeding or complications reported.   Goal of Therapy:  Heparin level 0.3-0.7 units/ml Monitor platelets by anticoagulation protocol: Yes   Plan:  Continue heparin IV infusion at 1150 units/hr Daily heparin level and CBC Continue to monitor H&H and platelets  Sherlon Handing, PharmD, BCPS Clinical pharmacist 06/04/2019 3:24 AM

## 2019-06-04 NOTE — Progress Notes (Signed)
Pt placed on low air loss bed to help with stage III pressure injury to his sacral area per WOC RN order.

## 2019-06-04 NOTE — Progress Notes (Signed)
Niece Violeta called to update on patient's status for the evening. Violeta conferenced in patient's daughters and wife. All questions answered. Thankful for the update and the care we are providing.

## 2019-06-04 NOTE — Consult Note (Addendum)
[facility] Nurse wound consult note completed via [place] with assistance from the bedside clinical staff. Reason for Consult: sacral ulcer First documented 05/21/19 Wound type: Stage 3 Pressure Injury; looks like an evolved Deep Tissue Pressure Injury Pressure Injury POA:No Measurement: 6cm x 9cm x 0.3cm  Wound bed: 90% pink, moist/10%dark purple with unroofed area centrally Drainage (amount, consistency, odor) scant; no odor Periwound:intact Dressing procedure/placement/frequency: 1. Add enzymatic debridement ointment to the central wound area. 2. Low air loss mattress requested  Managing the moisture with FMS and FC.  Turn and reposition per protocol   WOC Nurse team will follow with you and see patient within 10 days for wound assessments.  Please notify [facility] nurses of any acute changes in the wounds or any new areas of concern [place] MSN, [place], [place], [place]

## 2019-06-04 NOTE — Consult Note (Signed)
I have placed a request via Secure Chat to Dr. Grunz requesting photos of the wound areas of concern to be placed in the EMR.    Finneas Mathe MSN,RN,CWOCN, CNS, CWON-AP [phone] 

## 2019-06-05 DIAGNOSIS — L89303 Pressure ulcer of unspecified buttock, stage 3: Secondary | ICD-10-CM

## 2019-06-05 DIAGNOSIS — J80 Acute respiratory distress syndrome: Secondary | ICD-10-CM

## 2019-06-05 LAB — BASIC METABOLIC PANEL
Anion gap: 10 (ref 5–15)
BUN: 49 mg/dL — ABNORMAL HIGH (ref 8–23)
CO2: 41 mmol/L — ABNORMAL HIGH (ref 22–32)
Calcium: 8.1 mg/dL — ABNORMAL LOW (ref 8.9–10.3)
Chloride: 97 mmol/L — ABNORMAL LOW (ref 98–111)
Creatinine, Ser: 0.87 mg/dL (ref 0.61–1.24)
GFR calc Af Amer: >60 mL/min (ref >60)
GFR calc non Af Amer: >60 mL/min (ref >60)
Glucose, Bld: 245 mg/dL — ABNORMAL HIGH (ref 70–99)
Potassium: 4.6 mmol/L (ref 3.5–5.1)
Sodium: 148 mmol/L — ABNORMAL HIGH (ref 135–145)

## 2019-06-05 LAB — POCT I-STAT 7, (LYTES, BLD GAS, ICA,H+H)
Acid-Base Excess: 22 mmol/L — ABNORMAL HIGH (ref 0.0–2.0)
Acid-Base Excess: 17 mmol/L — ABNORMAL HIGH (ref 0.0–2.0)
Acid-Base Excess: 15 mmol/L — ABNORMAL HIGH (ref 0.0–2.0)
Acid-Base Excess: 18 mmol/L — ABNORMAL HIGH (ref 0.0–2.0)
Bicarbonate: 48.3 mmol/L — ABNORMAL HIGH (ref 20.0–28.0)
Bicarbonate: 44.9 mmol/L — ABNORMAL HIGH (ref 20.0–28.0)
Bicarbonate: 45.4 mmol/L — ABNORMAL HIGH (ref 20.0–28.0)
Bicarbonate: 46.7 mmol/L — ABNORMAL HIGH (ref 20.0–28.0)
Calcium, Ion: 1.09 mmol/L — ABNORMAL LOW (ref 1.15–1.40)
Calcium, Ion: 1.11 mmol/L — ABNORMAL LOW (ref 1.15–1.40)
Calcium, Ion: 1.12 mmol/L — ABNORMAL LOW (ref 1.15–1.40)
Calcium, Ion: 1.1 mmol/L — ABNORMAL LOW (ref 1.15–1.40)
HCT: 32 % — ABNORMAL LOW (ref 39.0–52.0)
HCT: 31 % — ABNORMAL LOW (ref 39.0–52.0)
HCT: 36 % — ABNORMAL LOW (ref 39.0–52.0)
HCT: 32 % — ABNORMAL LOW (ref 39.0–52.0)
Hemoglobin: 10.9 g/dL — ABNORMAL LOW (ref 13.0–17.0)
Hemoglobin: 10.5 g/dL — ABNORMAL LOW (ref 13.0–17.0)
Hemoglobin: 12.2 g/dL — ABNORMAL LOW (ref 13.0–17.0)
Hemoglobin: 10.9 g/dL — ABNORMAL LOW (ref 13.0–17.0)
O2 Saturation: 85 %
O2 Saturation: 88 %
O2 Saturation: 89 %
O2 Saturation: 81 %
Patient temperature: 98.6
Patient temperature: 98.6
Patient temperature: 98.6
Patient temperature: 98.6
Potassium: 3 mmol/L — ABNORMAL LOW (ref 3.5–5.1)
Potassium: 3.2 mmol/L — ABNORMAL LOW (ref 3.5–5.1)
Potassium: 3.2 mmol/L — ABNORMAL LOW (ref 3.5–5.1)
Potassium: 4.1 mmol/L (ref 3.5–5.1)
Sodium: 149 mmol/L — ABNORMAL HIGH (ref 135–145)
Sodium: 149 mmol/L — ABNORMAL HIGH (ref 135–145)
Sodium: 148 mmol/L — ABNORMAL HIGH (ref 135–145)
Sodium: 148 mmol/L — ABNORMAL HIGH (ref 135–145)
TCO2: >50 mmol/L — ABNORMAL HIGH (ref 22–32)
TCO2: 47 mmol/L — ABNORMAL HIGH (ref 22–32)
TCO2: 48 mmol/L — ABNORMAL HIGH (ref 22–32)
TCO2: 49 mmol/L — ABNORMAL HIGH (ref 22–32)
pCO2 arterial: 63.2 mmHg — ABNORMAL HIGH (ref 32.0–48.0)
pCO2 arterial: 69.3 mmHg (ref 32.0–48.0)
pCO2 arterial: 90.2 mmHg (ref 32.0–48.0)
pCO2 arterial: 76.9 mmHg (ref 32.0–48.0)
pH, Arterial: 7.491 — ABNORMAL HIGH (ref 7.350–7.450)
pH, Arterial: 7.419 (ref 7.350–7.450)
pH, Arterial: 7.31 — ABNORMAL LOW (ref 7.350–7.450)
pH, Arterial: 7.391 (ref 7.350–7.450)
pO2, Arterial: 49 mmHg — ABNORMAL LOW (ref 83.0–108.0)
pO2, Arterial: 58 mmHg — ABNORMAL LOW (ref 83.0–108.0)
pO2, Arterial: 67 mmHg — ABNORMAL LOW (ref 83.0–108.0)
pO2, Arterial: 49 mmHg — ABNORMAL LOW (ref 83.0–108.0)

## 2019-06-05 LAB — CBC
HCT: 33.5 % — ABNORMAL LOW (ref 39.0–52.0)
Hemoglobin: 10.3 g/dL — ABNORMAL LOW (ref 13.0–17.0)
MCH: 32.2 pg (ref 26.0–34.0)
MCHC: 30.7 g/dL (ref 30.0–36.0)
MCV: 104.7 fL — ABNORMAL HIGH (ref 80.0–100.0)
Platelets: 128 10*3/uL — ABNORMAL LOW (ref 150–400)
RBC: 3.2 MIL/uL — ABNORMAL LOW (ref 4.22–5.81)
RDW: 16.8 % — ABNORMAL HIGH (ref 11.5–15.5)
WBC: 8 10*3/uL (ref 4.0–10.5)
nRBC: 0.9 % — ABNORMAL HIGH (ref 0.0–0.2)

## 2019-06-05 LAB — COMPREHENSIVE METABOLIC PANEL
ALT: 61 U/L — ABNORMAL HIGH (ref 0–44)
AST: 57 U/L — ABNORMAL HIGH (ref 15–41)
Albumin: 1.8 g/dL — ABNORMAL LOW (ref 3.5–5.0)
Alkaline Phosphatase: 438 U/L — ABNORMAL HIGH (ref 38–126)
Anion gap: 12 (ref 5–15)
BUN: 40 mg/dL — ABNORMAL HIGH (ref 8–23)
CO2: 38 mmol/L — ABNORMAL HIGH (ref 22–32)
Calcium: 7.4 mg/dL — ABNORMAL LOW (ref 8.9–10.3)
Chloride: 99 mmol/L (ref 98–111)
Creatinine, Ser: 0.8 mg/dL (ref 0.61–1.24)
GFR calc Af Amer: >60 mL/min (ref >60)
GFR calc non Af Amer: >60 mL/min (ref >60)
Glucose, Bld: 212 mg/dL — ABNORMAL HIGH (ref 70–99)
Potassium: 2.9 mmol/L — ABNORMAL LOW (ref 3.5–5.1)
Sodium: 149 mmol/L — ABNORMAL HIGH (ref 135–145)
Total Bilirubin: 0.4 mg/dL (ref 0.3–1.2)
Total Protein: 5.9 g/dL — ABNORMAL LOW (ref 6.5–8.1)

## 2019-06-05 LAB — GLUCOSE, CAPILLARY
Glucose-Capillary: 148 mg/dL — ABNORMAL HIGH (ref 70–99)
Glucose-Capillary: 142 mg/dL — ABNORMAL HIGH (ref 70–99)
Glucose-Capillary: 144 mg/dL — ABNORMAL HIGH (ref 70–99)
Glucose-Capillary: 254 mg/dL — ABNORMAL HIGH (ref 70–99)
Glucose-Capillary: 235 mg/dL — ABNORMAL HIGH (ref 70–99)

## 2019-06-05 LAB — MAGNESIUM: Magnesium: 1.9 mg/dL (ref 1.7–2.4)

## 2019-06-05 LAB — HEPARIN LEVEL (UNFRACTIONATED)
Heparin Unfractionated: 0.93 IU/mL — ABNORMAL HIGH (ref 0.30–0.70)
Heparin Unfractionated: 0.5 IU/mL (ref 0.30–0.70)
Heparin Unfractionated: 0.25 IU/mL — ABNORMAL LOW (ref 0.30–0.70)

## 2019-06-05 MED ORDER — FREE WATER
300.0000 mL | Freq: Four times a day (QID) | Status: DC
  Administered 2019-06-05 – 2019-06-09 (×19): 300 mL

## 2019-06-05 MED ORDER — METOPROLOL TARTRATE 25 MG PO TABS
25.0000 mg | ORAL_TABLET | Freq: Two times a day (BID) | ORAL | Status: DC
  Administered 2019-06-05 (×2): 25 mg
  Filled 2019-06-05 (×2): qty 1

## 2019-06-05 MED ORDER — POTASSIUM CHLORIDE 20 MEQ/15ML (10%) PO SOLN
40.0000 meq | ORAL | Status: AC
  Administered 2019-06-05 (×2): 40 meq
  Filled 2019-06-05 (×2): qty 30

## 2019-06-05 MED ORDER — POTASSIUM CHLORIDE 10 MEQ/50ML IV SOLN
10.0000 meq | INTRAVENOUS | Status: AC
  Administered 2019-06-05 (×4): 10 meq via INTRAVENOUS
  Filled 2019-06-05 (×4): qty 50

## 2019-06-05 NOTE — Progress Notes (Signed)
ANTICOAGULATION CONSULT NOTE - Follow Up Consult  Pharmacy Consult for Heparin IV Indication: atrial fibrillation  No Known Allergies  Patient Measurements: Height: 5\' 5"  (165.1 cm) Weight: (bed weight 96.4kg;unsure of accuracy due to no trend) IBW/kg (Calculated) : 61.5 Heparin Dosing Weight: 85 kg  Vital Signs: Temp: 98.6 F (37 C) (06/17 0400) Temp Source: Rectal (06/17 0400) BP: 133/68 (06/17 0446) Pulse Rate: 93 (06/17 0446)  Labs: Recent Labs    06/03/19 0450  06/03/19 1600  06/04/19 0130 06/04/19 0504 06/05/19 0215  HGB 11.5*  --   --    < > 11.4* 10.5* 10.3*  HCT 37.1*  --   --    < > 37.2* 31.0* 33.5*  PLT 150  --   --   --  142*  --  128*  HEPARINUNFRC 0.29*   < > 0.23*  --  0.36  --  0.93*  CREATININE 0.81  --   --   --  0.81  --  0.80   < > = values in this interval not displayed.    Estimated Creatinine Clearance: 105.7 mL/min (by C-G formula based on SCr of 0.8 mg/dL).    Assessment: 89 yoM admitted on 5/27 with COVID-19 hypoxic respiratory failure, now with a prolonged hospitalization and currently intubated. He developed Afib and poorly rate controlled on diltiazem, now on Amio IV. Pharmacy consulted to start IV heparin. VTE prophylaxis this admission was Lovenox 50 mg BID; 5/27 - 6/14  Today, 06/05/2019:  Heparin level up to supratherapeutic (0.93) on 1150 units/hr  Hgb relatively stable, Plt down to 128  No bleeding or complications reported.   Goal of Therapy:  Heparin level 0.3-0.7 units/ml Monitor platelets by anticoagulation protocol: Yes   Plan:  Decrease heparin infusion to 1000 units/hr Will f/u 6 hr heparin level  Sherlon Handing, PharmD, BCPS Clinical pharmacist 06/05/2019 5:07 AM

## 2019-06-05 NOTE — Progress Notes (Addendum)
PROGRESS NOTE  Lance Mason  UJW:[id number] DOB: 06/03/1956 DOA: 05/09/2019 PCP: Patient, No Pcp Per   Brief Narrative: Lance Mason is a 63 y.o. male with a history of T2DM, HTN, obesity who presented with cough and shortness of breath and fever found to be hypoxic requiring NRB. CXR demonstrated bilateral opacities and [facility]19 pneumonia, ultimately intubated.  Assessment & Plan: Principal Problem:   Acute respiratory failure with hypoxemia (HCC) Active Problems:   Type 2 diabetes mellitus without complication, without long-term current use of insulin (HCC)   Pure hypercholesterolemia   Pneumonia due to COVID-19 virus   CAP (community acquired pneumonia)   Abnormal liver function   Pressure injury of skin  Acute hypoxic respiratory failure due to covid-19 pneumonia: Reintubated 6/10, febrile overnight. - Continue vent support per PCCM - Check daily labs: CBC w/diff, CMP, d-dimer, fibrinogen, ferritin, LDH, CRP - Avoid NSAIDs - s/p actemra 5/27, convalescent plasma 5/29, remdesivir 5/27 - 5/31. - s/p abx x7 days. Added back with suspected aspiration.  Cardiac arrest 6/11-6/12: With suspected perfusion limitation causing bilateral lower extremity arterial insufficiency/ischemia:  - CT head due to possible anoxic brain injury was nonacute.   Lower extremity ischemia: Anticipate this will develop into necrosis and ultimately require bilateral amputations.  - Poor prognosis. Discussed with wife that this also bodes poorly for cerebral recovery.  Aspiration/HCAP:  -He has been treated with an adequate course of cefepime/Flagyl and Zosyn.  No fevers.  Will discontinue further antibiotics - Need to continue tube feeds, elevate HOB  Oral candidiasis: - Has been given fluconazole x7 days.  T2DM: HbA1c 9.9%.  - Hold home metformin, glipizide - Continue covering with SSI -Blood sugars have been fairly stable  Hypotension: BPs trending  down, worse with sedation.  - Neo gtt, wean as able  AFib with RVR:  -Patient was started on amiodarone infusion and converted to sinus rhythm -We will discontinue further amiodarone and start on scheduled beta-blockers - Keep K and Mg wnl -He is anticoagulated with heparin  Obesity: BMI: 38. Contributes to poor prognosis - Optimize nutritional status per dietitian  Hypernatremia: Resolved.  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  - WOC was consulted and recommended enzymatic debridement to the central wound area as well as air mattress.    DVT prophylaxis: Heparin infusion Code Status: DNR Family Communication: Due to worsening ventilator needs/hypoxia and progressive lower extremity cyanosis/impending gangrene indicating poor prognosis, family was contacted 6/15 agreeing to DNR status. PCCM discussed with family 6/16 and 6/17 and the family wishes to continue current measures, no escalation of care. Palliative care consult to help address goals of care  Disposition Plan: Remain in ICU  Consultants:   PCCM  Procedures:   5/29 ETT  5/29 - 6/10 L subclavian central line  6/10 PICC  Antimicrobials:  Azithromycin 5/26 >> 6/2  Cefepime 5/27 >> 6/2 and 6/7 >>  Fluconazole 6/2 >> 6/9  Remdesivir 5/27 >> 5/31  Vancomycin 5/26 >> 5/28  And 6/7 >> 6/11  Metronidazole 6/11   Zosyn 6/12 >>6/17  Subjective: Intubated and sedated  Objective: Vitals:   06/05/19 1500 06/05/19 1530 06/05/19 1600 06/05/19 1625  BP: (!) 164/88 (!) 112/94 (!) 151/86 (!) 151/86  Pulse: (!) 110 (!) 108 (!) 109 (!) 110  Resp: (!) 35 (!) 23 (!) 30 (!) 38  Temp:   98.3 F (36.8 C)  TempSrc:   Axillary   SpO2: 90% 92% 92% 91%  Weight:      Height:        Intake/Output Summary (Last 24 hours) at 06/05/2019 1716 Last data filed at 06/05/2019 1604 Gross per 24 hour  Intake 2753.5 ml  Output 3565 ml  Net -811.5 ml   General exam:  Intubated and sedated Respiratory system: Clear to auscultation. Respiratory effort normal. Cardiovascular system:RRR. No murmurs, rubs, gallops. Gastrointestinal system: Abdomen is nondistended, soft and nontender. No organomegaly or masses felt. Normal bowel sounds heard. Central nervous system: Unable to assess due to sedation Extremities: No edema bilaterally Skin: Purple discoloration of toes bilaterally, cool to touch Psychiatry: Unable to assess   Data Reviewed: I have personally reviewed following labs and imaging studies  CBC: Recent Labs  Lab 06/01/19 0500  06/02/19 0403 06/03/19 0450  06/04/19 0130  06/05/19 0215 06/05/19 0450 06/05/19 0816 06/05/19 0859 06/05/19 1147  WBC 8.0  --  9.0 10.2  --  10.0  --  8.0  --   --   --   --   HGB 8.7*   < > 10.6* 11.5*   < > 11.4*   < > 10.3* 10.9* 10.5* 12.2* 10.9*  HCT 27.6*   < > 34.2* 37.1*   < > 37.2*   < > 33.5* 32.0* 31.0* 36.0* 32.0*  MCV 98.6  --  100.6* 102.2*  --  104.5*  --  104.7*  --   --   --   --   PLT 127*  --  160 150  --  142*  --  128*  --   --   --   --    < > = values in this interval not displayed.   Basic Metabolic Panel: Recent Labs  Lab 05/30/19 0815  05/31/19 0006  05/31/19 2047 06/01/19 0500  06/02/19 0403 06/03/19 0450  06/04/19 0130  06/05/19 0215 06/05/19 0450 06/05/19 0515 06/05/19 0816 06/05/19 0859 06/05/19 1147  NA 145   < > 153*   < > 153* 151*   < > 148* 146*   < > 149*   < > 149* 149*  --  149* 148* 148*  K 3.5   < > 3.0*   < > 3.1* 2.9*   < > 3.7 3.3*   < > 3.9   < > 2.9* 3.0*  --  3.2* 3.2* 4.1  CL 114*  --  118*   < > 113* 97*  --  108 101  --  101  --  99  --   --   --   --   --   CO2 22  --  25   < > 34* 46*  --  34* 36*  --  36*  --  38*  --   --   --   --   --   GLUCOSE 91  --  294*   < > 79 88  --  124* 237*  --  210*  --  212*  --   --   --   --   --   BUN 16  --  43*   < > 34* 25*  --  37* 38*  --  48*  --  40*  --   --   --   --   --   CREATININE 0.52*  --  1.01   < >  0.88 0.69  --  0.98 0.81  --  0.81  --  0.80  --   --   --   --   --   CALCIUM 6.6*  --  5.5*   < > 6.6* 5.3*  --  7.1* 7.1*  --  7.5*  --  7.4*  --   --   --   --   --   MG 2.1  --  1.9  --  2.4  --   --  2.2  --   --   --   --   --   --  1.9  --   --   --   PHOS  --   --   --   --  1.0* 1.3*  --  3.4  --   --   --   --   --   --   --   --   --   --    < > = values in this interval not displayed.   GFR: Estimated Creatinine Clearance: 100.9 mL/min (by C-G formula based on SCr of 0.8 mg/dL). Liver Function Tests: Recent Labs  Lab 06/01/19 0500 06/02/19 0403 06/03/19 0450 06/04/19 0130 06/05/19 0215  AST 72* 129* 95* 74* 57*  ALT 116* 122* 121* 92* 61*  ALKPHOS 194* 570* 539* 530* 438*  BILITOT 0.2* 0.7 0.1* 0.2* 0.4  PROT 4.1* 5.7* 6.2* 6.6 5.9*  ALBUMIN 1.5* 2.1* 2.1* 2.2* 1.8*   No results for input(s): LIPASE, AMYLASE in the last 168 hours. No results for input(s): AMMONIA in the last 168 hours. Coagulation Profile: No results for input(s): INR, PROTIME in the last 168 hours. Cardiac Enzymes: Recent Labs  Lab 05/31/19 0029 05/31/19 0630 05/31/19 1516 06/01/19 0950  TROPONINI 0.05* 0.05* 0.05* 0.06*   BNP (last 3 results) No results for input(s): PROBNP in the last 8760 hours. HbA1C: No results for input(s): HGBA1C in the last 72 hours. CBG: Recent Labs  Lab 06/04/19 2344 06/05/19 0324 06/05/19 0814 06/05/19 1110 06/05/19 1542  GLUCAP 220* 148* 142* 144* 254*   Lipid Profile: No results for input(s): CHOL, HDL, LDLCALC, TRIG, CHOLHDL, LDLDIRECT in the last 72 hours. Thyroid Function Tests: No results for input(s): TSH, T4TOTAL, FREET4, T3FREE, THYROIDAB in the last 72 hours. Anemia Panel: No results for input(s): VITAMINB12, FOLATE, FERRITIN, TIBC, IRON, RETICCTPCT in the last 72 hours. Urine analysis:    Component Value Date/Time   COLORURINE YELLOW 05/28/2016 2056   APPEARANCEUR CLEAR 05/28/2016 2056   LABSPEC 1.041 (H) 05/28/2016 2056   PHURINE 5.0  05/28/2016 2056   GLUCOSEU >1000 (A) 05/28/2016 2056   HGBUR NEGATIVE 05/28/2016 2056   BILIRUBINUR negative 06/19/2018 1601   KETONESUR negative 06/19/2018 1601   KETONESUR 15 (A) 05/28/2016 2056   PROTEINUR negative 06/19/2018 1601   PROTEINUR NEGATIVE 05/28/2016 2056   UROBILINOGEN 0.2 06/19/2018 1601   NITRITE Negative 06/19/2018 1601   NITRITE NEGATIVE 05/28/2016 2056   LEUKOCYTESUR Negative 06/19/2018 1601   Recent Results (from the past 240 hour(s))  Culture, blood (Routine X 2) w Reflex to ID Panel     Status: None (Preliminary result)   Collection Time: 06/02/19  5:45 AM   Specimen: BLOOD  Result Value Ref Range Status   Specimen Description   Final    BLOOD LEFT ARM Performed at [facility], [address] [place], [place] [address]    Special Requests   Final    BOTTLES DRAWN AEROBIC ONLY Blood Culture adequate volume Performed at [facility], [address] [place], [place] [address]    Culture   Final    NO GROWTH 3 DAYS Performed at [facility], [address] [place], [place] [address]    Report Status PENDING  Incomplete  Culture, blood (Routine X 2) w Reflex to ID Panel     Status: None (Preliminary result)   Collection Time: 06/02/19  5:50 AM   Specimen: BLOOD  Result Value Ref Range Status   Specimen Description   Final    BLOOD LEFT HAND Performed at [facility], [address] [place], [place] [address]    Special Requests   Final    BOTTLES DRAWN AEROBIC ONLY Blood Culture adequate volume Performed at [facility], [address] [place], [place] [address]    Culture   Final    NO GROWTH 3 DAYS Performed at [facility], [address] [place], [place] [address]    Report Status PENDING  Incomplete  Culture, respiratory (non-expectorated)     Status: None   Collection Time: 06/02/19 10:07 AM   Specimen: Tracheal Aspirate; Respiratory  Result Value  Ref Range Status   Specimen Description   Final    TRACHEAL ASPIRATE Performed at [facility], [address] [place], [place] [address]    Special Requests   Final    NONE Performed at [facility], [address] [place], [place] [address]    Gram Stain   Final    RARE WBC PRESENT, PREDOMINANTLY PMN NO ORGANISMS SEEN    Culture   Final    NO GROWTH 2 DAYS Performed at [facility], [address] [place], [place] [address]    Report Status 06/04/2019 FINAL  Final      Radiology Studies: No results found.  Scheduled Meds: . chlorhexidine  15 mL Mouth/Throat BID  . Chlorhexidine Gluconate Cloth  6 each Topical Daily  . collagenase   Topical Daily  . famotidine  20 mg Per Tube BID  . feeding supplement (PRO-STAT SUGAR FREE 64)  60 mL Per Tube BID  . feeding supplement (VITAL AF 1.2 CAL)  1,000 mL Per Tube Q24H  . free water  300 mL Per Tube Q6H  . furosemide  40 mg Intravenous Q12H  . guaiFENesin-dextromethorphan  15 mL Per Tube Q6H  . insulin aspart  0-20 Units Subcutaneous Q4H  . insulin glargine  5 Units Subcutaneous QHS  . mouth rinse  15 mL Mouth Rinse 10 times per day  . metoprolol tartrate  25 mg Per Tube BID  . multivitamin  15 mL Per Tube Daily  . nutrition supplement (JUVEN)  1 packet Per Tube BID BM  . sodium chloride flush  10-40 mL Intracatheter Q12H   Continuous Infusions: . sodium chloride Stopped (05/29/19 1741)  . heparin 1,000 Units/hr (06/05/19 1600)  . HYDROmorphone 5 mg/hr (06/05/19 1600)  . midazolam 1 mg/hr (06/05/19 1600)     LOS: 22 days   Critical care Time spent: 35 minutes.  Erick BlinksJehanzeb Pernella Ackerley, MD [facility] [email or web address] Password [facility] 06/05/2019, 5:16 PM

## 2019-06-05 NOTE — Progress Notes (Signed)
NAME:  Lance Mason, MRN:  [id number], DOB:  13-Sep-1956, LOS: 52 ADMISSION DATE:  01-Jun-2019, CONSULTATION DATE:  May 15, 2019 REFERRING MD:  Roderic Palau, CHIEF COMPLAINT:  Dyspnea   Brief History   63 year old male with type 2 diabetes, hypertension admitted on May 26 with acute respiratory failure with hypoxemia due to COVID-19 pneumonia.   Past Medical History  Hypertension Hyperlipidemia DM 2  Significant Hospital Events   May 26 admission May 29 intubation, mechanical ventilation in setting of profound encephalopathy May 31 worsening dyssynchrony, hypoxemia, added vecuronium June 1 chemical paralysis used overnight  June 2-5 weaning FiO2/PEEP June 6 weaning well on pressure support June 7 worsening oxygenation/tachycardia fever, started on antibiotics again June 9 following commands, weaning, extubated June 10 vomited, hypoxemic, re-intubated June 12 cardiac arrest. Stopped proning due to isntability June 14 Atrial fibrillation, worsening ventialtor settings June 15 made DNR  Consults:  Pulmonary and critical care medicine  Procedures:  Endotracheal tube May 29> June 9, June 10 >> Left subclavian central venous line May 29> June 10 June 10 PICC>   Significant Diagnostic Tests:  CT angiogram chest May 27 diffuse bilateral patchy airspace disease groundglass upper lobes predominant, no pulmonary embolism June 1 bilateral lower ext doppler > neg DVT June 1 TTE >>>LVEF > 65%, RV normal but poorly visualized CT head 6/7 > NAICP CT chest 6/8 > No PE, findings consistent with viral pneumonia, increased RLL consolidation, reactive subcarinal lymph node  Micro Data:  May 21 SARS-CoV-2 positive May 26 blood cultures June 7 resp culture > GPC, GNR, GPR > OPF June 7 blood culture > Neg  June 14 Blood cx >> June 14 Sputum Cx >>  Antimicrobials/COVID treatment:   May 26 azithromycin > June 2 May 27 cefepime > June 2 May 27 vancomycin > May 31 June 2 Fluconazole >   May 27 remdesivir May 26 solumedrol > 6/6 May 27 Tocilizumab x1 5/27 May 29 Convalescent plasma June 7 Vanc > 6/9 June 7 Cefepime > 6/9, restart 6/10 > 6/12 June 10 Flagyl > 6/12  Zosyn 6/12 >> 6/17  Interim history/subjective:   Remains mechanically ventilated Received amiodarone bolus yesterday for A. fib with RVR Remains on heparin Feet remain cyanotic Urine output adequate  Objective   Blood pressure (!) 146/79, pulse 95, temperature 98.6 F (37 C), temperature source Rectal, resp. rate (!) 21, height 5\' 5"  (1.651 m), weight 96.4 kg, SpO2 90 %.    Vent Mode: PRVC FiO2 (%):  [75 %] 75 % Set Rate:  [35 bmp] 35 bmp Vt Set:  [430 mL] 430 mL PEEP:  [20 cmH20] 20 cmH20 Plateau Pressure:  [46 cmH20-54 cmH20] 46 cmH20   Intake/Output Summary (Last 24 hours) at 06/05/2019 0806 Last data filed at 06/05/2019 0700 Gross per 24 hour  Intake 1983.12 ml  Output 4615 ml  Net -2631.88 ml   Filed Weights   06/05/19 0500  Weight: 96.4 kg    Examination:  General:  In bed on vent HENT: NCAT ETT in place PULM: CTA B, vent supported breathing CV: RRR, no mgr GI: BS+, soft, nontender MSK: normal bulk and tone Derm: cyanotic feet/toes Neuro: sedated on vent   Resolved Hospital Problem list     Assessment & Plan:  ARDS due to COVID pneumonia: no significant progress Continue ARDS ventilator settings, target tidal volume 6-8cc/kg ideal body weight, keep plateau pressure less than 30 as able Changed to pressure control this morning for vent synchrony, monitor TVol Hold prone  positioning given cardiac arrest with prone positioning last week Wean PEEP/FiO2 to maintain SaO2 greater than 88%  Aspiration/healthcare associated pneumonia: Stop zosyn today  Atrial fibrillation: Telemetry monitoring Continue amiodarone, heparin  Need for sedation/ventilator synchrony ICU delirium Continue quetiapine Monitor QTc interval RASS goal -2  Overall prognosis: Poor.  I had a  lengthy phone conversation with the patient's family again today and again explained that [name] has a very slim chance of survival and if he does survive he will require bilateral amputations in his feet and months of inpatient recovery.  I have asked them if this is a goal they believe he would want to attain.  They are unable to answer that question right now.  They desire that we continue our current level of care.  I re-iterated that I would not offer hemodialysis as if he had multi-organ failure requiring multiple forms of life support.    Best practice:  Diet: tube feeding Pain/Anxiety/Delirium protocol (if indicated): dilaudid, versed infusion, RASS goal -2 VAP protocol (if indicated): yes DVT prophylaxis: heparin infusion GI prophylaxis: famotidine Glucose control: SSI, Lantus  Mobility: passive range of motion in bed Code Status: Full Family Communication: see note above Disposition: remain in ICU  Labs   CBC: Recent Labs  Lab 06/01/19 0500  06/02/19 0403 06/03/19 0450 06/03/19 1707 06/04/19 0130 06/04/19 0504 06/05/19 0215  WBC 8.0  --  9.0 10.2  --  10.0  --  8.0  HGB 8.7*   < > 10.6* 11.5* 11.2* 11.4* 10.5* 10.3*  HCT 27.6*   < > 34.2* 37.1* 33.0* 37.2* 31.0* 33.5*  MCV 98.6  --  100.6* 102.2*  --  104.5*  --  104.7*  PLT 127*  --  160 150  --  142*  --  128*   < > = values in this interval not displayed.    Basic Metabolic Panel: Recent Labs  Lab 05/30/19 0430 05/30/19 0815  05/31/19 0006  05/31/19 2047 06/01/19 0500  06/02/19 0403 06/03/19 0450 06/03/19 1707 06/04/19 0130 06/04/19 0504 06/05/19 0215  NA QUESTIONABLE RESULTS, RECOMMEND RECOLLECT TO VERIFY 145   < > 153*   < > 153* 151*   < > 148* 146* 148* 149* 143 149*  K QUESTIONABLE RESULTS, RECOMMEND RECOLLECT TO VERIFY 3.5   < > 3.0*   < > 3.1* 2.9*   < > 3.7 3.3* 4.0 3.9 3.8 2.9*  CL QUESTIONABLE RESULTS, RECOMMEND RECOLLECT TO VERIFY 114*  --  118*   < > 113* 97*  --  108 101  --  101  --  99   CO2 QUESTIONABLE RESULTS, RECOMMEND RECOLLECT TO VERIFY 22  --  25   < > 34* 46*  --  34* 36*  --  36*  --  38*  GLUCOSE QUESTIONABLE RESULTS, RECOMMEND RECOLLECT TO VERIFY 91  --  294*   < > 79 88  --  124* 237*  --  210*  --  212*  BUN QUESTIONABLE RESULTS, RECOMMEND RECOLLECT TO VERIFY 16  --  43*   < > 34* 25*  --  37* 38*  --  48*  --  40*  CREATININE QUESTIONABLE RESULTS, RECOMMEND RECOLLECT TO VERIFY 0.52*  --  1.01   < > 0.88 0.69  --  0.98 0.81  --  0.81  --  0.80  CALCIUM QUESTIONABLE RESULTS, RECOMMEND RECOLLECT TO VERIFY 6.6*  --  5.5*   < > 6.6* 5.3*  --  7.1* 7.1*  --  7.5*  --  7.4*  MG QUESTIONABLE RESULTS, RECOMMEND RECOLLECT TO VERIFY 2.1  --  1.9  --  2.4  --   --  2.2  --   --   --   --   --   PHOS  --   --   --   --   --  1.0* 1.3*  --  3.4  --   --   --   --   --    < > = values in this interval not displayed.   GFR: Estimated Creatinine Clearance: 100.9 mL/min (by C-G formula based on SCr of 0.8 mg/dL). Recent Labs  Lab 05/30/19 0430 05/30/19 0815  05/31/19 0705 05/31/19 1517  06/01/19 0950 06/02/19 0403 06/03/19 0450 06/04/19 0130 06/05/19 0215  PROCALCITON QUESTIONABLE RESULTS, RECOMMEND RECOLLECT TO VERIFY 0.78  --   --   --   --   --   --   --   --   --   WBC QUESTIONABLE RESULTS, RECOMMEND RECOLLECT TO VERIFY  --    < >  --   --    < >  --  9.0 10.2 10.0 8.0  LATICACIDVEN  --   --   --  4.0* 2.6*  --  1.4  --   --   --   --    < > = values in this interval not displayed.    Liver Function Tests: Recent Labs  Lab 06/01/19 0500 06/02/19 0403 06/03/19 0450 06/04/19 0130 06/05/19 0215  AST 72* 129* 95* 74* 57*  ALT 116* 122* 121* 92* 61*  ALKPHOS 194* 570* 539* 530* 438*  BILITOT 0.2* 0.7 0.1* 0.2* 0.4  PROT 4.1* 5.7* 6.2* 6.6 5.9*  ALBUMIN 1.5* 2.1* 2.1* 2.2* 1.8*   No results for input(s): LIPASE, AMYLASE in the last 168 hours. No results for input(s): AMMONIA in the last 168 hours.  ABG    Component Value Date/Time   PHART 7.532 (H)  06/04/2019 0504   PCO2ART 53.2 (H) 06/04/2019 0504   PO2ART 68.0 (L) 06/04/2019 0504   HCO3 45.0 (H) 06/04/2019 0504   TCO2 47 (H) 06/04/2019 0504   ACIDBASEDEF 1.0 05/31/2019 0200   O2SAT 95.0 06/04/2019 0504     Coagulation Profile: No results for input(s): INR, PROTIME in the last 168 hours.  Cardiac Enzymes: Recent Labs  Lab 05/31/19 0029 05/31/19 0630 05/31/19 1516 06/01/19 0950  TROPONINI 0.05* 0.05* 0.05* 0.06*    HbA1C: Hemoglobin A1C  Date/Time Value Ref Range Status  06/19/2018 04:01 PM 9.1 (A) 4.0 - 5.6 % Final  01/02/2017 06:51 PM 12.8  Final   Hgb A1c MFr Bld  Date/Time Value Ref Range Status  05/23/2019 02:20 AM 9.9 (H) 4.8 - 5.6 % Final    Comment:    (NOTE) Pre diabetes:          5.7%-6.4% Diabetes:              >6.4% Glycemic control for   <7.0% adults with diabetes     CBG: Recent Labs  Lab 06/04/19 1143 06/04/19 1514 06/04/19 1953 06/04/19 2344 06/05/19 0324  GLUCAP 183* 147* 270* 220* 148*       Critical care time: 60 minutes     Heber CarolinaBrent McQuaid, MD [place] [facility] Pager: [phone] Cell: [phone] If no response, call [phone]

## 2019-06-05 NOTE — Progress Notes (Signed)
ANTICOAGULATION CONSULT NOTE - Follow Up Consult  Pharmacy Consult for Heparin IV Indication: atrial fibrillation  No Known Allergies  Patient Measurements: Height: 5\' 5"  (165.1 cm) Weight: 212 lb 8.4 oz (96.4 kg) IBW/kg (Calculated) : 61.5 Heparin Dosing Weight: 85 kg  Vital Signs: Temp: 98.5 F (36.9 C) (06/17 2000) Temp Source: Oral (06/17 2000) BP: 149/89 (06/17 2130) Pulse Rate: 116 (06/17 2130)  Labs: Recent Labs    06/03/19 0450  06/04/19 0130  06/05/19 0215  06/05/19 0816 06/05/19 0859 06/05/19 1147 06/05/19 1200 06/05/19 2020  HGB 11.5*   < > 11.4*   < > 10.3*   < > 10.5* 12.2* 10.9*  --   --   HCT 37.1*   < > 37.2*   < > 33.5*   < > 31.0* 36.0* 32.0*  --   --   PLT 150  --  142*  --  128*  --   --   --   --   --   --   HEPARINUNFRC 0.29*   < > 0.36  --  0.93*  --   --   --   --  0.50 0.25*  CREATININE 0.81  --  0.81  --  0.80  --   --   --   --   --  0.87   < > = values in this interval not displayed.    Estimated Creatinine Clearance: 92.8 mL/min (by C-G formula based on SCr of 0.87 mg/dL).    Assessment: 28 yoM admitted on 5/27 with COVID-19 hypoxic respiratory failure, now with a prolonged hospitalization and currently intubated. He developed Afib, poorly rate controlled on diltiazem, now on Amio IV. Pharmacy consulted to start IV heparin. VTE prophylaxis this admission was Lovenox 50 mg BID; 5/27 - 6/14  Now heparin level is down to 0.25. Hgb relatively stable at 10.9, Plt down to 128. No bleeding or complications reported.   Goal of Therapy:  Heparin level 0.3-0.7 units/ml Monitor platelets by anticoagulation protocol: Yes   Plan:  Increase heparin IV infusion to 1,100 units/hr Check 6 hr heparin level Monitor daily heparin level, CBC, s/s of bleed  Elenor Quinones, PharmD, BCPS, BCIDP Clinical Pharmacist 06/05/2019 10:27 PM

## 2019-06-05 NOTE — Progress Notes (Signed)
[facility] ADULT ICU REPLACEMENT PROTOCOL FOR AM LAB REPLACEMENT ONLY  The patient does apply for the [facility] Adult ICU Electrolyte Replacment Protocol based on the criteria listed below:   1. Is GFR >/= 40 ml/min? Yes.    Patient's GFR today is >60 2. Is urine output >/= 0.5 ml/kg/hr for the last 6 hours? Yes.   Patient's UOP is 1.3 ml/kg/hr 3. Is BUN < 60 mg/dL? Yes.    Patient's BUN today is 40 4. Abnormal electrolyte(s): K2.9 5. Ordered repletion with: per protocol 6. If a panic level lab has been reported, has the CCM MD in charge been notified? Yes.  .   Physician:  Jeannene Patella, MD  [facility] 06/05/2019 5:16 AM

## 2019-06-05 NOTE — Progress Notes (Signed)
Spoke with pt's family via telephone with niece interpreting. I answered any questions and concerns they had regarding the pt and his plan of care thus far. Pt's family would like to talk more with the Dr regarding Dexamethazone trial in treating covid pt's. Will let Dr Lake Bells follow up with pt's family regarding this.

## 2019-06-05 NOTE — Progress Notes (Signed)
ANTICOAGULATION CONSULT NOTE - Follow Up Consult  Pharmacy Consult for Heparin IV Indication: atrial fibrillation  No Known Allergies  Patient Measurements: Height: 5\' 5"  (165.1 cm) Weight: 212 lb 8.4 oz (96.4 kg) IBW/kg (Calculated) : 61.5 Heparin Dosing Weight: 85 kg  Vital Signs: Temp: 98.4 F (36.9 C) (06/17 0800) Temp Source: Axillary (06/17 0800) BP: 201/91 (06/17 0900) Pulse Rate: 105 (06/17 0900)  Labs: Recent Labs    06/03/19 0450  06/03/19 1600  06/04/19 0130  06/05/19 0215 06/05/19 0816 06/05/19 0859  HGB 11.5*  --   --    < > 11.4*   < > 10.3* 10.5* 12.2*  HCT 37.1*  --   --    < > 37.2*   < > 33.5* 31.0* 36.0*  PLT 150  --   --   --  142*  --  128*  --   --   HEPARINUNFRC 0.29*   < > 0.23*  --  0.36  --  0.93*  --   --   CREATININE 0.81  --   --   --  0.81  --  0.80  --   --    < > = values in this interval not displayed.    Estimated Creatinine Clearance: 100.9 mL/min (by C-G formula based on SCr of 0.8 mg/dL).    Assessment: 50 yoM admitted on 5/27 with COVID-19 hypoxic respiratory failure, now with a prolonged hospitalization and currently intubated. He developed Afib, poorly rate controlled on diltiazem, now on Amio IV. Pharmacy consulted to start IV heparin. VTE prophylaxis this admission was Lovenox 50 mg BID; 5/27 - 6/14  Today, 06/05/2019:  Heparin level 0.5, therapeutic on Heparin at 1000 units/hr  Hgb relatively stable at 10.9, Plt down to 128  No bleeding or complications reported.   Goal of Therapy:  Heparin level 0.3-0.7 units/ml Monitor platelets by anticoagulation protocol: Yes   Plan:  Continue heparin IV infusion at 1000 units/hr Heparin level in 6 hours to confirm therapeutic level. Daily heparin level and CBC, monitor for s/s bleeding.     Gretta Arab PharmD, BCPS Clinical Pharmacist Clinical pharmacist phone 7am- 5pm: [phone] 06/05/2019 1:10 PM

## 2019-06-06 DIAGNOSIS — I4891 Unspecified atrial fibrillation: Secondary | ICD-10-CM

## 2019-06-06 LAB — GLUCOSE, CAPILLARY
Glucose-Capillary: 141 mg/dL — ABNORMAL HIGH (ref 70–99)
Glucose-Capillary: 144 mg/dL — ABNORMAL HIGH (ref 70–99)
Glucose-Capillary: 151 mg/dL — ABNORMAL HIGH (ref 70–99)
Glucose-Capillary: 168 mg/dL — ABNORMAL HIGH (ref 70–99)
Glucose-Capillary: 218 mg/dL — ABNORMAL HIGH (ref 70–99)
Glucose-Capillary: 238 mg/dL — ABNORMAL HIGH (ref 70–99)
Glucose-Capillary: 260 mg/dL — ABNORMAL HIGH (ref 70–99)

## 2019-06-06 LAB — BASIC METABOLIC PANEL
Anion gap: 10 (ref 5–15)
BUN: 44 mg/dL — ABNORMAL HIGH (ref 8–23)
CO2: 43 mmol/L — ABNORMAL HIGH (ref 22–32)
Calcium: 8.1 mg/dL — ABNORMAL LOW (ref 8.9–10.3)
Chloride: 95 mmol/L — ABNORMAL LOW (ref 98–111)
Creatinine, Ser: 0.77 mg/dL (ref 0.61–1.24)
GFR calc Af Amer: >60 mL/min (ref >60)
GFR calc non Af Amer: >60 mL/min (ref >60)
Glucose, Bld: 155 mg/dL — ABNORMAL HIGH (ref 70–99)
Potassium: 3.8 mmol/L (ref 3.5–5.1)
Sodium: 148 mmol/L — ABNORMAL HIGH (ref 135–145)

## 2019-06-06 LAB — CBC
HCT: 35 % — ABNORMAL LOW (ref 39.0–52.0)
Hemoglobin: 10.2 g/dL — ABNORMAL LOW (ref 13.0–17.0)
MCH: 31.6 pg (ref 26.0–34.0)
MCHC: 29.1 g/dL — ABNORMAL LOW (ref 30.0–36.0)
MCV: 108.4 fL — ABNORMAL HIGH (ref 80.0–100.0)
Platelets: 147 10*3/uL — ABNORMAL LOW (ref 150–400)
RBC: 3.23 MIL/uL — ABNORMAL LOW (ref 4.22–5.81)
RDW: 17.6 % — ABNORMAL HIGH (ref 11.5–15.5)
WBC: 7.2 10*3/uL (ref 4.0–10.5)
nRBC: 2.4 % — ABNORMAL HIGH (ref 0.0–0.2)

## 2019-06-06 LAB — HEPARIN LEVEL (UNFRACTIONATED): Heparin Unfractionated: 0.36 IU/mL (ref 0.30–0.70)

## 2019-06-06 MED ORDER — METOPROLOL TARTRATE 25 MG/10 ML ORAL SUSPENSION
25.0000 mg | Freq: Four times a day (QID) | ORAL | Status: DC
  Administered 2019-06-06 – 2019-06-09 (×13): 25 mg
  Filled 2019-06-06 (×14): qty 10

## 2019-06-06 MED ORDER — DILTIAZEM HCL 25 MG/5ML IV SOLN
10.0000 mg | Freq: Once | INTRAVENOUS | Status: AC
  Administered 2019-06-06: 10 mg via INTRAVENOUS
  Filled 2019-06-06: qty 5

## 2019-06-06 MED ORDER — METOPROLOL TARTRATE 25 MG/10 ML ORAL SUSPENSION
25.0000 mg | Freq: Two times a day (BID) | ORAL | Status: DC
  Administered 2019-06-06: 25 mg
  Filled 2019-06-06: qty 10

## 2019-06-06 NOTE — Progress Notes (Signed)
NAME:  Lance Mason, MRN:  [id number], DOB:  01/14/1956, LOS: 28 ADMISSION DATE:  04/28/2019, CONSULTATION DATE:  May 15, 2019 REFERRING MD:  Roderic Palau, CHIEF COMPLAINT:  Dyspnea   Brief History   63 year old male with type 2 diabetes, hypertension admitted on May 26 with acute respiratory failure with hypoxemia due to COVID-19 pneumonia.   Past Medical History  Hypertension Hyperlipidemia DM 2  Significant Hospital Events   May 26 admission May 29 intubation, mechanical ventilation in setting of profound encephalopathy May 31 worsening dyssynchrony, hypoxemia, added vecuronium June 1 chemical paralysis used overnight  June 2-5 weaning FiO2/PEEP June 6 weaning well on pressure support June 7 worsening oxygenation/tachycardia fever, started on antibiotics again June 9 following commands, weaning, extubated June 10 vomited, hypoxemic, re-intubated June 12 cardiac arrest. Stopped proning due to isntability June 14 Atrial fibrillation, worsening ventialtor settings June 15 made DNR June 18 palliative medicine consult  Consults:  Pulmonary and critical care medicine  Procedures:  Endotracheal tube May 29> June 9, June 10 >> Left subclavian central venous line May 29> June 10 June 10 PICC>   Significant Diagnostic Tests:  CT angiogram chest May 27 diffuse bilateral patchy airspace disease groundglass upper lobes predominant, no pulmonary embolism June 1 bilateral lower ext doppler > neg DVT June 1 TTE >>>LVEF > 65%, RV normal but poorly visualized CT head 6/7 > NAICP CT chest 6/8 > No PE, findings consistent with viral pneumonia, increased RLL consolidation, reactive subcarinal lymph node  Micro Data:  May 21 SARS-CoV-2 positive May 26 blood cultures June 7 resp culture > GPC, GNR, GPR > OPF June 7 blood culture > Neg  June 14 Blood cx >> June 14 Sputum Cx >>  Antimicrobials/COVID treatment:   May 26 azithromycin > June 2 May 27 cefepime > June 2 May 27  vancomycin > May 31 June 2 Fluconazole >  May 27 remdesivir May 26 solumedrol > 6/6 May 27 Tocilizumab x1 5/27 May 29 Convalescent plasma June 7 Vanc > 6/9 June 7 Cefepime > 6/9, restart 6/10 > 6/12 June 10 Flagyl > 6/12  Zosyn 6/12 >> 6/17  Interim history/subjective:   Remains critically ill Remains mechanically ventilated, severe hypoxemia with high ventilator settings Urine output adequate Family conversation yesterday: Family still unsure about disposition in regards to goals of care  Objective   Blood pressure 125/72, pulse 100, temperature 98.3 F (36.8 C), temperature source Oral, resp. rate (!) 27, height 5\' 5"  (1.651 m), weight 96.4 kg, SpO2 97 %.    Vent Mode: PRVC FiO2 (%):  [70 %-80 %] 70 % Set Rate:  [32 bmp-35 bmp] 35 bmp Vt Set:  [370 mL] 370 mL PEEP:  [18 cmH20] 18 cmH20 Plateau Pressure:  [39 cmH20-45 cmH20] 39 cmH20   Intake/Output Summary (Last 24 hours) at 06/06/2019 8676 Last data filed at 06/06/2019 0600 Gross per 24 hour  Intake 2457.34 ml  Output 3400 ml  Net -942.66 ml   Filed Weights   06/05/19 0500  Weight: 96.4 kg    Examination:  General:  In bed on vent HENT: NCAT ETT in place PULM: CTA B, vent supported breathing CV: RRR, no mgr GI: BS+, soft, nontender MSK: normal bulk and tone Neuro: sedated on vent Derm: toes cyanotic/no blood flow, dark in color L>R    Resolved Hospital Problem list     Assessment & Plan:  ARDS due to COVID pneumonia: no significant progress Continue ARDS ventilator settings, target tidal volume 6 to 8 cc/kg  ideal body weight, keep plateau pressure less than 30 Hold prone positioning given goals of care conversations, prognosis is poor Continue pressure control for ventilator synchrony, monitor tidal volume closely Wean PEEP and FiO2 to maintain SaO2 greater than 88%  Aspiration/healthcare associated pneumonia: Monitor off antibiotics  Atrial fibrillation: Tele monitoring Holding amiodarone   Need for sedation/ventilator synchrony ICU delirium Continue quetiapine Monitor QTc RASS goal -2  Bilateral ischemic/gangrenous toes: ultimately will need amputation but family doesn't want this for him and inidcated they didn't think he would want that Continue supportive care  Overall prognosis: Poor, see discussion in prior notes  Best practice:  Diet: tube feeding Pain/Anxiety/Delirium protocol (if indicated): dilaudid, versed infusion, RASS goal -2 VAP protocol (if indicated): yes DVT prophylaxis: heparin infusion GI prophylaxis: famotidine Glucose control: SSI, Lantus  Mobility: passive range of motion in bed Code Status: Full Family Communication: see note above Disposition: remain in ICU  Labs   CBC: Recent Labs  Lab 06/02/19 0403 06/03/19 0450  06/04/19 0130  06/05/19 0215 06/05/19 0450 06/05/19 0816 06/05/19 0859 06/05/19 1147 06/06/19 0400  WBC 9.0 10.2  --  10.0  --  8.0  --   --   --   --  7.2  HGB 10.6* 11.5*   < > 11.4*   < > 10.3* 10.9* 10.5* 12.2* 10.9* 10.2*  HCT 34.2* 37.1*   < > 37.2*   < > 33.5* 32.0* 31.0* 36.0* 32.0* 35.0*  MCV 100.6* 102.2*  --  104.5*  --  104.7*  --   --   --   --  108.4*  PLT 160 150  --  142*  --  128*  --   --   --   --  147*   < > = values in this interval not displayed.    Basic Metabolic Panel: Recent Labs  Lab 05/30/19 0815  05/31/19 0006  05/31/19 2047 06/01/19 0500  06/02/19 0403 06/03/19 0450  06/04/19 0130  06/05/19 0215  06/05/19 0515 06/05/19 0816 06/05/19 0859 06/05/19 1147 06/05/19 2020 06/06/19 0400  NA 145   < > 153*   < > 153* 151*   < > 148* 146*   < > 149*   < > 149*   < >  --  149* 148* 148* 148* 148*  K 3.5   < > 3.0*   < > 3.1* 2.9*   < > 3.7 3.3*   < > 3.9   < > 2.9*   < >  --  3.2* 3.2* 4.1 4.6 3.8  CL 114*  --  118*   < > 113* 97*  --  108 101  --  101  --  99  --   --   --   --   --  97* 95*  CO2 22  --  25   < > 34* 46*  --  34* 36*  --  36*  --  38*  --   --   --   --   --  41* 43*   GLUCOSE 91  --  294*   < > 79 88  --  124* 237*  --  210*  --  212*  --   --   --   --   --  245* 155*  BUN 16  --  43*   < > 34* 25*  --  37* 38*  --  48*  --  40*  --   --   --   --   --  49* 44*  CREATININE 0.52*  --  1.01   < > 0.88 0.69  --  0.98 0.81  --  0.81  --  0.80  --   --   --   --   --  0.87 0.77  CALCIUM 6.6*  --  5.5*   < > 6.6* 5.3*  --  7.1* 7.1*  --  7.5*  --  7.4*  --   --   --   --   --  8.1* 8.1*  MG 2.1  --  1.9  --  2.4  --   --  2.2  --   --   --   --   --   --  1.9  --   --   --   --   --   PHOS  --   --   --   --  1.0* 1.3*  --  3.4  --   --   --   --   --   --   --   --   --   --   --   --    < > = values in this interval not displayed.   GFR: Estimated Creatinine Clearance: 100.9 mL/min (by C-G formula based on SCr of 0.77 mg/dL). Recent Labs  Lab 05/30/19 0815  05/31/19 0705 05/31/19 1517  06/01/19 0950  06/03/19 0450 06/04/19 0130 06/05/19 0215 06/06/19 0400  PROCALCITON 0.78  --   --   --   --   --   --   --   --   --   --   WBC  --    < >  --   --    < >  --    < > 10.2 10.0 8.0 7.2  LATICACIDVEN  --   --  4.0* 2.6*  --  1.4  --   --   --   --   --    < > = values in this interval not displayed.    Liver Function Tests: Recent Labs  Lab 06/01/19 0500 06/02/19 0403 06/03/19 0450 06/04/19 0130 06/05/19 0215  AST 72* 129* 95* 74* 57*  ALT 116* 122* 121* 92* 61*  ALKPHOS 194* 570* 539* 530* 438*  BILITOT 0.2* 0.7 0.1* 0.2* 0.4  PROT 4.1* 5.7* 6.2* 6.6 5.9*  ALBUMIN 1.5* 2.1* 2.1* 2.2* 1.8*   No results for input(s): LIPASE, AMYLASE in the last 168 hours. No results for input(s): AMMONIA in the last 168 hours.  ABG    Component Value Date/Time   PHART 7.391 06/05/2019 1147   PCO2ART 76.9 (HH) 06/05/2019 1147   PO2ART 49.0 (L) 06/05/2019 1147   HCO3 46.7 (H) 06/05/2019 1147   TCO2 49 (H) 06/05/2019 1147   ACIDBASEDEF 1.0 05/31/2019 0200   O2SAT 81.0 06/05/2019 1147     Coagulation Profile: No results for input(s): INR, PROTIME in  the last 168 hours.  Cardiac Enzymes: Recent Labs  Lab 05/31/19 0029 05/31/19 0630 05/31/19 1516 06/01/19 0950  TROPONINI 0.05* 0.05* 0.05* 0.06*    HbA1C: Hemoglobin A1C  Date/Time Value Ref Range Status  06/19/2018 04:01 PM 9.1 (A) 4.0 - 5.6 % Final  01/02/2017 06:51 PM 12.8  Final   Hgb A1c MFr Bld  Date/Time Value Ref Range Status  05/23/2019 02:20 AM 9.9 (H) 4.8 - 5.6 % Final    Comment:    (NOTE) Pre diabetes:  5.7%-6.4% Diabetes:              >6.4% Glycemic control for   <7.0% adults with diabetes     CBG: Recent Labs  Lab 06/05/19 1110 06/05/19 1542 06/05/19 1943 06/06/19 0053 06/06/19 0446  GLUCAP 144* 254* 235* 238* 144*       Critical care time: 35 minutes     Heber CarolinaBrent Modene Andy, MD [place] [facility] Pager: [phone] Cell: [phone] If no response, call [phone]

## 2019-06-06 NOTE — Progress Notes (Signed)
ANTICOAGULATION CONSULT NOTE - Follow Up Consult  Pharmacy Consult for Heparin IV Indication: atrial fibrillation  No Known Allergies  Patient Measurements: Height: 5\' 5"  (165.1 cm) Weight: 212 lb 8.4 oz (96.4 kg) IBW/kg (Calculated) : 61.5 Heparin Dosing Weight: 85 kg  Vital Signs: Temp: 98.3 F (36.8 C) (06/18 0000) Temp Source: Oral (06/18 0000) BP: 160/86 (06/18 0430) Pulse Rate: 111 (06/18 0430)  Labs: Recent Labs    06/04/19 0130  06/05/19 0215  06/05/19 0859 06/05/19 1147 06/05/19 1200 06/05/19 2020 06/06/19 0400  HGB 11.4*   < > 10.3*   < > 12.2* 10.9*  --   --  10.2*  HCT 37.2*   < > 33.5*   < > 36.0* 32.0*  --   --  35.0*  PLT 142*  --  128*  --   --   --   --   --  147*  HEPARINUNFRC 0.36  --  0.93*  --   --   --  0.50 0.25*  --   CREATININE 0.81  --  0.80  --   --   --   --  0.87  --    < > = values in this interval not displayed.    Estimated Creatinine Clearance: 92.8 mL/min (by C-G formula based on SCr of 0.87 mg/dL).    Assessment: 55 yoM admitted on 5/27 with COVID-19 hypoxic respiratory failure, now with a prolonged hospitalization and currently intubated. He developed Afib, poorly rate controlled on diltiazem, now on Amio IV. Pharmacy consulted to start IV heparin. VTE prophylaxis this admission was Lovenox 50 mg BID; 5/27 - 6/14  Now heparin level therapeutic at 0.36. Hgb relatively stable at 10.2, Plt 147. No bleeding or complications reported.   Goal of Therapy:  Heparin level 0.3-0.7 units/ml Monitor platelets by anticoagulation protocol: Yes   Plan:  Continue heparin IV infusion at 1,100 units/hr Monitor daily heparin level, CBC, s/s of bleed  Elenor Quinones, PharmD, BCPS, BCIDP Clinical Pharmacist 06/06/2019 5:54 AM

## 2019-06-06 NOTE — Progress Notes (Signed)
Facetimed for an hour with 4 different numbers of family members, other family members joined in as well to see patient.  Updated and answered questions and concerns with niece as a Optometrist.    Will continue to monitor patient

## 2019-06-06 NOTE — Progress Notes (Signed)
Nutrition Follow-up RD working remotely.  DOCUMENTATION CODES:   Obesity unspecified  INTERVENTION:   Continue TF via OGT:   Vital AF 1.2 at 35 ml/h (840 ml per day)   Pro-stat 60 ml BID   Provides 1408 kcal, 123 gm protein, 681 ml free water, 93 gm carbohydrates daily  Continue Juven BID via tube, each packet provides 80 calories, 8 grams of carbohydrate, 2.5 grams of protein (collagen), 7 grams of L-arginine and 7 grams of L-glutamine; supplement contains CaHMB, Vitamins C, E, B12 and Zinc to promote wound healing  NUTRITION DIAGNOSIS:   Increased nutrient needs related to acute illness(COVID-19) as evidenced by estimated needs.  Ongoing   GOAL:   Provide needs based on ASPEN/SCCM guidelines  Met with TF  MONITOR:   Vent status, Labs, Skin, I & O's  ASSESSMENT:   63 yo Spanish speaking male with PMH of DM, HTN, HLD who was admitted with SOB and cough x 1 week. COVID-19 positive on 5/21.  Patient has a poor prognosis. Proning is on hold. Toes are cyanotic. Palliative Care team has been consulted to discuss goals of care with family.  Patient remains intubated on ventilator support Temp (24hrs), Avg:98.5 F (36.9 C), Min:98.3 F (36.8 C), Max:98.9 F (37.2 C)   OG tube in place. Receiving Vital AF 1.2 at 35 ml/h with Pro-stat 60 ml BID. Patient tolerating TF well. Also receiving 300 ml free water flushes every 6 hours.  Labs reviewed. Sodium 148 (H) CBG's: [phone]  Medications reviewed and include Lasix, Novolog, Lantus, MVI, Juven.  Weight down 20 lbs since admission. I/O net +4.3 L   Diet Order:   Diet Order            Diet NPO time specified  Diet effective now              EDUCATION NEEDS:   No education needs have been identified at this time  Skin:  Skin Assessment: Skin Integrity Issues: Skin Integrity Issues:: Stage II, Unstageable Stage II: R buttocks Unstageable: L buttocks Other: MASD to buttocks, skin tear to  sacrum, blister to buttocks, toes are cyanotic  Last BM:  6/18 (type 7)  Height:   Ht Readings from Last 1 Encounters:  05/10/2019 _0  (1.651 m)    Weight:   Wt Readings from Last 1 Encounters:  06/05/19 96.4 kg    Ideal Body Weight:  61.8 kg  BMI:  Body mass index is 35.37 kg/m.  Estimated Nutritional Needs:   Kcal:  1160-1480  Protein:  124 gm  Fluid:  2 L    Molli Barrows, RD, LDN, [place] Pager [phone] After Hours Pager [phone]

## 2019-06-06 NOTE — Progress Notes (Signed)
[facility] PCCM  I called the patient's family utilizing his niece as a Optometrist again.  I expressed my concern for him and that I believe he is suffering.  Conversation was focused on the fact that even if he has a slim chance of recovery his best possible outcome in 3 months would be residing in a nursing home with feet amputations.  His overall chances of survival 12 months are near 0.    Have asked palliative medicine to assist the family in letting us know if they believe Lance Mason would ask for [place] to withdraw care at this point.    Roselie Awkward, MD [place] [facility] Pager: [phone] Cell: [phone] If no response, call [phone]

## 2019-06-06 NOTE — Progress Notes (Signed)
PROGRESS NOTE  Lance Mason  RUE:[id number] DOB: 09-08-1956 DOA: 04/23/2019 PCP: Patient, No Pcp Per   Brief Narrative: Lance Mason is a 63 y.o. male with a history of T2DM, HTN, obesity who presented with cough and shortness of breath and fever found to be hypoxic requiring NRB. CXR demonstrated bilateral opacities and [facility]19 [facility], ultimately intubated.  Assessment & Plan: Principal Problem:   Acute respiratory failure with hypoxemia (HCC) Active Problems:   Type 2 diabetes mellitus without complication, without long-term current use of insulin (HCC)   Pure hypercholesterolemia   Pneumonia due to COVID-19 virus   CAP (community acquired pneumonia)   Abnormal liver function   Pressure injury of skin  Acute hypoxic respiratory failure due to covid-19 pneumonia: Reintubated 6/10 - Continue vent support per PCCM - Check daily labs: CBC w/diff, CMP, d-dimer, fibrinogen, ferritin, LDH, CRP - Avoid NSAIDs - s/p actemra 5/27, convalescent plasma 5/29, remdesivir 5/27 - 5/31. - s/p abx x7 days.   Cardiac arrest 6/11-6/12: With suspected perfusion limitation causing bilateral lower extremity arterial insufficiency/ischemia:  - CT head due to possible anoxic brain injury was nonacute.   Lower extremity ischemia: Anticipate this will develop into necrosis and ultimately require bilateral amputations.  - Poor prognosis. Discussed with wife that this also bodes poorly for cerebral recovery.  Aspiration/HCAP:  -He has been treated with an adequate course of cefepime/Flagyl and Zosyn.  No fevers.  Will discontinue further antibiotics - Need to continue tube feeds, elevate HOB  Oral candidiasis: - Has been given fluconazole x7 days.  T2DM: HbA1c 9.9%.  - Hold home metformin, glipizide - Continue covering with SSI -Blood sugars have been fairly stable  AFib with RVR:  -Patient was started on amiodarone infusion and converted to sinus  rhythm -Amiodarone has since been discontinued and he is currently on beta-blockers.  Remains in sinus rhythm. - Keep K and Mg wnl -He is anticoagulated with heparin  Obesity: BMI: 38. Contributes to poor prognosis - Optimize nutritional status per dietitian  Hypernatremia: Resolved.  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  - WOC was consulted and recommended enzymatic debridement to the central wound area as well as air mattress.    DVT prophylaxis: Heparin infusion Code Status: DNR Family Communication: Due to worsening ventilator needs/hypoxia and progressive lower extremity cyanosis/impending gangrene indicating poor prognosis, family was contacted 6/15 agreeing to DNR status. PCCM discussed with family 6/16 and 6/17 and the family wishes to continue current measures, no escalation of care. Palliative care consult to help address goals of care  Disposition Plan: Remain in ICU  Consultants:   PCCM  Procedures:   5/29 ETT  5/29 - 6/10 L subclavian central line  6/10 PICC  Antimicrobials:  Azithromycin 5/26 >> 6/2  Cefepime 5/27 >> 6/2 and 6/7 >>d/c  Fluconazole 6/2 >> 6/9  Remdesivir 5/27 >> 5/31  Vancomycin 5/26 >> 5/28  And 6/7 >> 6/11  Metronidazole 6/11   Zosyn 6/12 >>6/17  Subjective: Intubated and sedated  Objective: Vitals:   06/06/19 1051 06/06/19 1200 06/06/19 1224 06/06/19 1300  BP: 128/65  131/66 115/65  Pulse: 100  (!) 103 (!) 118  Resp:   (!) 22 (!) 24  Temp:  98.3 F (36.8 C)    TempSrc:      SpO2:   (!) 89% (!) 87%  Weight:      Height:  Intake/Output Summary (Last 24 hours) at 06/06/2019 1355 Last data filed at 06/06/2019 0900 Gross per 24 hour  Intake 1745.44 ml  Output 3200 ml  Net -1454.56 ml   General exam: intubated and sedated Respiratory system: Clear to auscultation. Respiratory effort normal. Cardiovascular system:RRR. No murmurs, rubs, gallops.  Gastrointestinal system: Abdomen is nondistended, soft and nontender. No organomegaly or masses felt. Normal bowel sounds heard. Central nervous system: unable to assess Extremities: purple discoloration of toes bilaterally which are cool to touch Skin: No rashes, lesions or ulcers Psychiatry: unable to assess     Data Reviewed: I have personally reviewed following labs and imaging studies  CBC: Recent Labs  Lab 06/02/19 0403 06/03/19 0450  06/04/19 0130  06/05/19 0215 06/05/19 0450 06/05/19 0816 06/05/19 0859 06/05/19 1147 06/06/19 0400  WBC 9.0 10.2  --  10.0  --  8.0  --   --   --   --  7.2  HGB 10.6* 11.5*   < > 11.4*   < > 10.3* 10.9* 10.5* 12.2* 10.9* 10.2*  HCT 34.2* 37.1*   < > 37.2*   < > 33.5* 32.0* 31.0* 36.0* 32.0* 35.0*  MCV 100.6* 102.2*  --  104.5*  --  104.7*  --   --   --   --  108.4*  PLT 160 150  --  142*  --  128*  --   --   --   --  147*   < > = values in this interval not displayed.   Basic Metabolic Panel: Recent Labs  Lab 05/31/19 0006  05/31/19 2047 06/01/19 0500  06/02/19 0403 06/03/19 0450  06/04/19 0130  06/05/19 0215  06/05/19 0515 06/05/19 0816 06/05/19 0859 06/05/19 1147 06/05/19 2020 06/06/19 0400  NA 153*   < > 153* 151*   < > 148* 146*   < > 149*   < > 149*   < >  --  149* 148* 148* 148* 148*  K 3.0*   < > 3.1* 2.9*   < > 3.7 3.3*   < > 3.9   < > 2.9*   < >  --  3.2* 3.2* 4.1 4.6 3.8  CL 118*   < > 113* 97*  --  108 101  --  101  --  99  --   --   --   --   --  97* 95*  CO2 25   < > 34* 46*  --  34* 36*  --  36*  --  38*  --   --   --   --   --  41* 43*  GLUCOSE 294*   < > 79 88  --  124* 237*  --  210*  --  212*  --   --   --   --   --  245* 155*  BUN 43*   < > 34* 25*  --  37* 38*  --  48*  --  40*  --   --   --   --   --  49* 44*  CREATININE 1.01   < > 0.88 0.69  --  0.98 0.81  --  0.81  --  0.80  --   --   --   --   --  0.87 0.77  CALCIUM 5.5*   < > 6.6* 5.3*  --  7.1* 7.1*  --  7.5*  --  7.4*  --   --   --   --   --  8.1*  8.1*  MG 1.9  --  2.4  --   --  2.2  --   --   --   --   --   --  1.9  --   --   --   --   --   PHOS  --   --  1.0* 1.3*  --  3.4  --   --   --   --   --   --   --   --   --   --   --   --    < > = values in this interval not displayed.   GFR: Estimated Creatinine Clearance: 100.9 mL/min (by C-G formula based on SCr of 0.77 mg/dL). Liver Function Tests: Recent Labs  Lab 06/01/19 0500 06/02/19 0403 06/03/19 0450 06/04/19 0130 06/05/19 0215  AST 72* 129* 95* 74* 57*  ALT 116* 122* 121* 92* 61*  ALKPHOS 194* 570* 539* 530* 438*  BILITOT 0.2* 0.7 0.1* 0.2* 0.4  PROT 4.1* 5.7* 6.2* 6.6 5.9*  ALBUMIN 1.5* 2.1* 2.1* 2.2* 1.8*   No results for input(s): LIPASE, AMYLASE in the last 168 hours. No results for input(s): AMMONIA in the last 168 hours. Coagulation Profile: No results for input(s): INR, PROTIME in the last 168 hours. Cardiac Enzymes: Recent Labs  Lab 05/31/19 0029 05/31/19 0630 05/31/19 1516 06/01/19 0950  TROPONINI 0.05* 0.05* 0.05* 0.06*   BNP (last 3 results) No results for input(s): PROBNP in the last 8760 hours. HbA1C: No results for input(s): HGBA1C in the last 72 hours. CBG: Recent Labs  Lab 06/05/19 1943 06/06/19 0053 06/06/19 0446 06/06/19 0818 06/06/19 1204  GLUCAP 235* 238* 144* 141* 168*   Lipid Profile: No results for input(s): CHOL, HDL, LDLCALC, TRIG, CHOLHDL, LDLDIRECT in the last 72 hours. Thyroid Function Tests: No results for input(s): TSH, T4TOTAL, FREET4, T3FREE, THYROIDAB in the last 72 hours. Anemia Panel: No results for input(s): VITAMINB12, FOLATE, FERRITIN, TIBC, IRON, RETICCTPCT in the last 72 hours. Urine analysis:    Component Value Date/Time   COLORURINE YELLOW 05/28/2016 2056   APPEARANCEUR CLEAR 05/28/2016 2056   LABSPEC 1.041 (H) 05/28/2016 2056   PHURINE 5.0 05/28/2016 2056   GLUCOSEU >1000 (A) 05/28/2016 2056   HGBUR NEGATIVE 05/28/2016 2056   BILIRUBINUR negative 06/19/2018 1601   KETONESUR negative 06/19/2018 1601    KETONESUR 15 (A) 05/28/2016 2056   PROTEINUR negative 06/19/2018 1601   PROTEINUR NEGATIVE 05/28/2016 2056   UROBILINOGEN 0.2 06/19/2018 1601   NITRITE Negative 06/19/2018 1601   NITRITE NEGATIVE 05/28/2016 2056   LEUKOCYTESUR Negative 06/19/2018 1601   Recent Results (from the past 240 hour(s))  Culture, blood (Routine X 2) w Reflex to ID Panel     Status: None (Preliminary result)   Collection Time: 06/02/19  5:45 AM   Specimen: BLOOD  Result Value Ref Range Status   Specimen Description   Final    BLOOD LEFT ARM Performed at [facility], [address] [place], [place] [address]    Special Requests   Final    BOTTLES DRAWN AEROBIC ONLY Blood Culture adequate volume Performed at [facility], [address] [place], [place] [address]    Culture   Final    NO GROWTH 4 DAYS Performed at [facility], [address] [place], [place] [address]    Report Status PENDING  Incomplete  Culture, blood (Routine X 2) w Reflex to ID Panel  Status: None (Preliminary result)   Collection Time: 06/02/19  5:50 AM   Specimen: BLOOD  Result Value Ref Range Status   Specimen Description   Final    BLOOD LEFT HAND Performed at [facility] [address], [place], [place] [address]    Special Requests   Final    BOTTLES DRAWN AEROBIC ONLY Blood Culture adequate volume Performed at [facility] [address] [place], [place] [address]    Culture   Final    NO GROWTH 4 DAYS Performed at [facility], [place] [address] [place], [place] [address]    Report Status PENDING  Incomplete  Culture, respiratory (non-expectorated)     Status: None   Collection Time: 06/02/19 10:07 AM   Specimen: Tracheal Aspirate; Respiratory  Result Value Ref Range Status   Specimen Description   Final    TRACHEAL ASPIRATE Performed at [facility] [address] [place], [place] [address]     Special Requests   Final    NONE Performed at [facility], [place] [address], [place], [place] [address]    Gram Stain   Final    RARE WBC PRESENT, PREDOMINANTLY PMN NO ORGANISMS SEEN    Culture   Final    NO GROWTH 2 DAYS Performed at [facility] [address] [place], [place] [address]    Report Status 06/04/2019 FINAL  Final      Radiology Studies: No results found.  Scheduled Meds: . chlorhexidine  15 mL Mouth/Throat BID  . Chlorhexidine Gluconate Cloth  6 each Topical Daily  . collagenase   Topical Daily  . famotidine  20 mg Per Tube BID  . feeding supplement (PRO-STAT SUGAR FREE 64)  60 mL Per Tube BID  . feeding supplement (VITAL AF 1.2 CAL)  1,000 mL Per Tube Q24H  . free water  300 mL Per Tube Q6H  . furosemide  40 mg Intravenous Q12H  . guaiFENesin-dextromethorphan  15 mL Per Tube Q6H  . insulin aspart  0-20 Units Subcutaneous Q4H  . insulin glargine  5 Units Subcutaneous QHS  . mouth rinse  15 mL Mouth Rinse 10 times per day  . metoprolol tartrate  25 mg Per Tube BID  . multivitamin  15 mL Per Tube Daily  . nutrition supplement (JUVEN)  1 packet Per Tube BID BM  . sodium chloride flush  10-40 mL Intracatheter Q12H   Continuous Infusions: . sodium chloride 10 mL/hr at 06/06/19 0900  . heparin 1,100 Units/hr (06/06/19 0900)  . HYDROmorphone 5 mg/hr (06/06/19 0900)  . midazolam 1 mg/hr (06/06/19 0900)     LOS: 23 days   Critical care Time spent: 35 minutes.  Kathie Dike, MD [facility] [email or web address] Password [email or web address] 06/06/2019, 1:55 PM

## 2019-06-07 DIAGNOSIS — Z515 Encounter for palliative care: Secondary | ICD-10-CM

## 2019-06-07 DIAGNOSIS — Z7189 Other specified counseling: Secondary | ICD-10-CM

## 2019-06-07 LAB — GLUCOSE, CAPILLARY
Glucose-Capillary: 129 mg/dL — ABNORMAL HIGH (ref 70–99)
Glucose-Capillary: 188 mg/dL — ABNORMAL HIGH (ref 70–99)
Glucose-Capillary: 221 mg/dL — ABNORMAL HIGH (ref 70–99)
Glucose-Capillary: 198 mg/dL — ABNORMAL HIGH (ref 70–99)
Glucose-Capillary: 209 mg/dL — ABNORMAL HIGH (ref 70–99)
Glucose-Capillary: 165 mg/dL — ABNORMAL HIGH (ref 70–99)

## 2019-06-07 LAB — COMPREHENSIVE METABOLIC PANEL
ALT: 64 U/L — ABNORMAL HIGH (ref 0–44)
AST: 61 U/L — ABNORMAL HIGH (ref 15–41)
Albumin: 1.8 g/dL — ABNORMAL LOW (ref 3.5–5.0)
Alkaline Phosphatase: 482 U/L — ABNORMAL HIGH (ref 38–126)
Anion gap: 9 (ref 5–15)
BUN: 46 mg/dL — ABNORMAL HIGH (ref 8–23)
CO2: 44 mmol/L — ABNORMAL HIGH (ref 22–32)
Calcium: 8 mg/dL — ABNORMAL LOW (ref 8.9–10.3)
Chloride: 91 mmol/L — ABNORMAL LOW (ref 98–111)
Creatinine, Ser: 0.75 mg/dL (ref 0.61–1.24)
GFR calc Af Amer: >60 mL/min (ref >60)
GFR calc non Af Amer: >60 mL/min (ref >60)
Glucose, Bld: 156 mg/dL — ABNORMAL HIGH (ref 70–99)
Potassium: 3.6 mmol/L (ref 3.5–5.1)
Sodium: 144 mmol/L (ref 135–145)
Total Bilirubin: 0.2 mg/dL — ABNORMAL LOW (ref 0.3–1.2)
Total Protein: 6.4 g/dL — ABNORMAL LOW (ref 6.5–8.1)

## 2019-06-07 LAB — CULTURE, BLOOD (ROUTINE X 2)
Culture: NO GROWTH
Culture: NO GROWTH
Special Requests: ADEQUATE
Special Requests: ADEQUATE

## 2019-06-07 LAB — CBC
HCT: 32.6 % — ABNORMAL LOW (ref 39.0–52.0)
Hemoglobin: 9.5 g/dL — ABNORMAL LOW (ref 13.0–17.0)
MCH: 31.7 pg (ref 26.0–34.0)
MCHC: 29.1 g/dL — ABNORMAL LOW (ref 30.0–36.0)
MCV: 108.7 fL — ABNORMAL HIGH (ref 80.0–100.0)
Platelets: 146 10*3/uL — ABNORMAL LOW (ref 150–400)
RBC: 3 MIL/uL — ABNORMAL LOW (ref 4.22–5.81)
RDW: 17.9 % — ABNORMAL HIGH (ref 11.5–15.5)
WBC: 7.8 10*3/uL (ref 4.0–10.5)
nRBC: 1.8 % — ABNORMAL HIGH (ref 0.0–0.2)

## 2019-06-07 LAB — POCT I-STAT 7, (LYTES, BLD GAS, ICA,H+H)
Acid-Base Excess: 21 mmol/L — ABNORMAL HIGH (ref 0.0–2.0)
Bicarbonate: 50 mmol/L — ABNORMAL HIGH (ref 20.0–28.0)
Calcium, Ion: 1.13 mmol/L — ABNORMAL LOW (ref 1.15–1.40)
HCT: 38 % — ABNORMAL LOW (ref 39.0–52.0)
Hemoglobin: 12.9 g/dL — ABNORMAL LOW (ref 13.0–17.0)
O2 Saturation: 86 %
Patient temperature: 98.6
Potassium: 3.5 mmol/L (ref 3.5–5.1)
Sodium: 144 mmol/L (ref 135–145)
TCO2: >50 mmol/L — ABNORMAL HIGH (ref 22–32)
pCO2 arterial: 78.9 mmHg (ref 32.0–48.0)
pH, Arterial: 7.41 (ref 7.350–7.450)
pO2, Arterial: 54 mmHg — ABNORMAL LOW (ref 83.0–108.0)

## 2019-06-07 LAB — HEPARIN LEVEL (UNFRACTIONATED)
Heparin Unfractionated: 0.18 IU/mL — ABNORMAL LOW (ref 0.30–0.70)
Heparin Unfractionated: 0.22 IU/mL — ABNORMAL LOW (ref 0.30–0.70)
Heparin Unfractionated: 0.27 IU/mL — ABNORMAL LOW (ref 0.30–0.70)

## 2019-06-07 MED ORDER — HEPARIN (PORCINE) 25000 UT/250ML-% IV SOLN
1350.0000 [IU]/h | INTRAVENOUS | Status: DC
  Administered 2019-06-08: 1350 [IU]/h via INTRAVENOUS
  Filled 2019-06-07: qty 250

## 2019-06-07 NOTE — Progress Notes (Signed)
NAME:  Lance Mason, MRN:  [id number], DOB:  1956/12/12, LOS: 24 ADMISSION DATE:  28-Apr-2019, CONSULTATION DATE:  May 15, 2019 REFERRING MD:  Kerry HoughMemon, CHIEF COMPLAINT:  Dyspnea   Brief History   63 year old male with type 2 diabetes, hypertension admitted on May 26 with acute respiratory failure with hypoxemia due to COVID-19 pneumonia.   Past Medical History  Hypertension Hyperlipidemia DM 2  Significant Hospital Events   May 26 admission May 29 intubation, mechanical ventilation in setting of profound encephalopathy May 31 worsening dyssynchrony, hypoxemia, added vecuronium June 1 chemical paralysis used overnight  June 2-5 weaning FiO2/PEEP June 6 weaning well on pressure support June 7 worsening oxygenation/tachycardia fever, started on antibiotics again June 9 following commands, weaning, extubated June 10 vomited, hypoxemic, re-intubated June 12 cardiac arrest. Stopped proning due to isntability June 14 Atrial fibrillation, worsening ventialtor settings June 15 made DNR June 18 palliative medicine consult  Consults:  Pulmonary and critical care medicine  Procedures:  Endotracheal tube May 29> June 9, June 10 >> Left subclavian central venous line May 29> June 10 June 10 PICC>   Significant Diagnostic Tests:  CT angiogram chest May 27 diffuse bilateral patchy airspace disease groundglass upper lobes predominant, no pulmonary embolism June 1 bilateral lower ext doppler > neg DVT June 1 TTE >>>LVEF > 65%, RV normal but poorly visualized CT head 6/7 > NAICP CT chest 6/8 > No PE, findings consistent with viral pneumonia, increased RLL consolidation, reactive subcarinal lymph node  Micro Data:  May 21 SARS-CoV-2 positive May 26 blood cultures June 7 resp culture > GPC, GNR, GPR > OPF June 7 blood culture > Neg  June 14 Blood cx >> June 14 Sputum Cx >>  Antimicrobials/COVID treatment:   May 26 azithromycin > June 2 May 27 cefepime > June 2 May 27  vancomycin > May 31 June 2 Fluconazole >  May 27 remdesivir May 26 solumedrol > 6/6 May 27 Tocilizumab x1 5/27 May 29 Convalescent plasma June 7 Vanc > 6/9 June 7 Cefepime > 6/9, restart 6/10 > 6/12 June 10 Flagyl > 6/12  Zosyn 6/12 >> 6/17  Interim history/subjective:   No major changes today Remains on high ventilatory support   Objective   Blood pressure 109/62, pulse 90, temperature 97.8 F (36.6 C), temperature source Axillary, resp. rate 19, height 5\' 5"  (1.651 m), weight 96.4 kg, SpO2 94 %.    Vent Mode: PRVC FiO2 (%):  [70 %-100 %] 80 % Set Rate:  [35 bmp] 35 bmp Vt Set:  [370 mL] 370 mL PEEP:  [18 cmH20] 18 cmH20 Plateau Pressure:  [31 cmH20-46 cmH20] 40 cmH20   Intake/Output Summary (Last 24 hours) at 06/07/2019 0813 Last data filed at 06/07/2019 [id number] Gross per 24 hour  Intake 1911.55 ml  Output 3925 ml  Net -2013.45 ml   Filed Weights   06/05/19 0500  Weight: 96.4 kg    Examination:  General:  In bed on vent HENT: NCAT ETT in place PULM: CTA B, vent supported breathing CV: RRR, no mgr GI: BS+, soft, nontender MSK: normal bulk and tone Neuro: sedated on vent Derm: cyanotic/purple toes    Resolved Hospital Problem list     Assessment & Plan:  ARDS due to COVID pneumonia: no significant progress Continue ARDS ventilator settings, targeting tidal volume 6 to 8 cc/kg ideal body weight, keeping plateau pressure less than 30 cm of water Hold prone positioning Continue pressure control for ventilator synchrony, monitor tidal volume closely Wean PEEP  and FiO2 to maintain SaO2 greater than 88%  Aspiration/healthcare associated pneumonia: Monitor off of antibiotics  Atrial fibrillation: Telemetry monitoring Hold amiodarone  Need for sedation/ventilator synchrony ICU Delirium Continue quetiapine, monitor QTC, RA SS target -2 ICU delirium Continue quetiapine Monitor QTc RASS goal -2  Bilateral ischemic/gangrenous toes: Continue supportive  care Continue to discuss goals of care with family, they have indicated that he would not want amputation  Overall prognosis: Poor, see multiple family conversation notes previously  Best practice:  Diet: tube feeding Pain/Anxiety/Delirium protocol (if indicated): dilaudid, versed infusion, RASS goal -2 VAP protocol (if indicated): yes DVT prophylaxis: heparin infusion GI prophylaxis: famotidine Glucose control: SSI, Lantus  Mobility: passive range of motion in bed Code Status: Full Family Communication: see note above Disposition: remain in ICU  Labs   CBC: Recent Labs  Lab 06/03/19 0450  06/04/19 0130  06/05/19 0215  06/05/19 0859 06/05/19 1147 06/06/19 0400 06/07/19 0425 06/07/19 0450  WBC 10.2  --  10.0  --  8.0  --   --   --  7.2  --  7.8  HGB 11.5*   < > 11.4*   < > 10.3*   < > 12.2* 10.9* 10.2* 12.9* 9.5*  HCT 37.1*   < > 37.2*   < > 33.5*   < > 36.0* 32.0* 35.0* 38.0* 32.6*  MCV 102.2*  --  104.5*  --  104.7*  --   --   --  108.4*  --  108.7*  PLT 150  --  142*  --  128*  --   --   --  147*  --  146*   < > = values in this interval not displayed.    Basic Metabolic Panel: Recent Labs  Lab 05/31/19 2047 06/01/19 0500  06/02/19 0403  06/04/19 0130  06/05/19 0215  06/05/19 0515  06/05/19 1147 06/05/19 2020 06/06/19 0400 06/07/19 0425 06/07/19 0450  NA 153* 151*   < > 148*   < > 149*   < > 149*   < >  --    < > 148* 148* 148* 144 144  K 3.1* 2.9*   < > 3.7   < > 3.9   < > 2.9*   < >  --    < > 4.1 4.6 3.8 3.5 3.6  CL 113* 97*  --  108   < > 101  --  99  --   --   --   --  97* 95*  --  91*  CO2 34* 46*  --  34*   < > 36*  --  38*  --   --   --   --  41* 43*  --  44*  GLUCOSE 79 88  --  124*   < > 210*  --  212*  --   --   --   --  245* 155*  --  156*  BUN 34* 25*  --  37*   < > 48*  --  40*  --   --   --   --  49* 44*  --  46*  CREATININE 0.88 0.69  --  0.98   < > 0.81  --  0.80  --   --   --   --  0.87 0.77  --  0.75  CALCIUM 6.6* 5.3*  --  7.1*   < >  7.5*  --  7.4*  --   --   --   --  8.1* 8.1*  --  8.0*  MG 2.4  --   --  2.2  --   --   --   --   --  1.9  --   --   --   --   --   --   PHOS 1.0* 1.3*  --  3.4  --   --   --   --   --   --   --   --   --   --   --   --    < > = values in this interval not displayed.   GFR: Estimated Creatinine Clearance: 100.9 mL/min (by C-G formula based on SCr of 0.75 mg/dL). Recent Labs  Lab 05/31/19 1517  06/01/19 0950  06/04/19 0130 06/05/19 0215 06/06/19 0400 06/07/19 0450  WBC  --    < >  --    < > 10.0 8.0 7.2 7.8  LATICACIDVEN 2.6*  --  1.4  --   --   --   --   --    < > = values in this interval not displayed.    Liver Function Tests: Recent Labs  Lab 06/02/19 0403 06/03/19 0450 06/04/19 0130 06/05/19 0215 06/07/19 0450  AST 129* 95* 74* 57* 61*  ALT 122* 121* 92* 61* 64*  ALKPHOS 570* 539* 530* 438* 482*  BILITOT 0.7 0.1* 0.2* 0.4 0.2*  PROT 5.7* 6.2* 6.6 5.9* 6.4*  ALBUMIN 2.1* 2.1* 2.2* 1.8* 1.8*   No results for input(s): LIPASE, AMYLASE in the last 168 hours. No results for input(s): AMMONIA in the last 168 hours.  ABG    Component Value Date/Time   PHART 7.410 06/07/2019 0425   PCO2ART 78.9 (HH) 06/07/2019 0425   PO2ART 54.0 (L) 06/07/2019 0425   HCO3 50.0 (H) 06/07/2019 0425   TCO2 >50 (H) 06/07/2019 0425   ACIDBASEDEF 1.0 05/31/2019 0200   O2SAT 86.0 06/07/2019 0425     Coagulation Profile: No results for input(s): INR, PROTIME in the last 168 hours.  Cardiac Enzymes: Recent Labs  Lab 05/31/19 1516 06/01/19 0950  TROPONINI 0.05* 0.06*    HbA1C: Hemoglobin A1C  Date/Time Value Ref Range Status  06/19/2018 04:01 PM 9.1 (A) 4.0 - 5.6 % Final  01/02/2017 06:51 PM 12.8  Final   Hgb A1c MFr Bld  Date/Time Value Ref Range Status  05/23/2019 02:20 AM 9.9 (H) 4.8 - 5.6 % Final    Comment:    (NOTE) Pre diabetes:          5.7%-6.4% Diabetes:              >6.4% Glycemic control for   <7.0% adults with diabetes     CBG: Recent Labs  Lab 06/06/19  1715 06/06/19 1940 06/06/19 2335 06/07/19 0326 06/07/19 0741  GLUCAP 218* 260* 151* 129* 188*       Critical care time: 35 minutes     Heber CarolinaBrent , MD [place] [facility] Pager: [phone] Cell: [phone] If no response, call [phone]

## 2019-06-07 NOTE — Progress Notes (Signed)
ANTICOAGULATION CONSULT NOTE - Follow Up Consult  Pharmacy Consult for Heparin IV Indication: atrial fibrillation  No Known Allergies  Patient Measurements: Height: 5\' 5"  (165.1 cm) Weight: 212 lb 8.4 oz (96.4 kg) IBW/kg (Calculated) : 61.5 Heparin Dosing Weight: 85 kg  Vital Signs: Temp: 99.4 F (37.4 C) (06/19 2000) Temp Source: Oral (06/19 2000) BP: 126/59 (06/19 2200) Pulse Rate: 95 (06/19 2200)  Labs: Recent Labs    06/05/19 0215  06/05/19 2020 06/06/19 0400 06/07/19 0425 06/07/19 0450 06/07/19 1405 06/07/19 2055  HGB 10.3*   < >  --  10.2* 12.9* 9.5*  --   --   HCT 33.5*   < >  --  35.0* 38.0* 32.6*  --   --   PLT 128*  --   --  147*  --  146*  --   --   HEPARINUNFRC 0.93*   < > 0.25* 0.36  --  0.18* 0.22* 0.27*  CREATININE 0.80  --  0.87 0.77  --  0.75  --   --    < > = values in this interval not displayed.    Estimated Creatinine Clearance: 100.9 mL/min (by C-G formula based on SCr of 0.75 mg/dL).    Assessment: 89 yoM admitted on 5/27 with COVID-19 hypoxic respiratory failure, now with a prolonged hospitalization and currently intubated. He developed Afib, poorly rate controlled on diltiazem, now on Amio IV. Pharmacy consulted to start IV heparin. VTE prophylaxis this admission was Lovenox 50 mg BID; 5/27 - 6/14  Heparin level up a little and borderline therapeutic at 0.27. Hgb decreased to 9.5, Plt up to 146.  No bleeding or complications reported.   Goal of Therapy:  Heparin level 0.3-0.7 units/ml Monitor platelets by anticoagulation protocol: Yes   Plan:  Increase heparin IV infusion slightly to 1,350 units/hr Daily heparin level and CBC, monitor for s/s bleeding  Consider need of heparin gtt since plan for compassionate extubation?  Elenor Quinones, PharmD, BCPS, BCIDP Clinical Pharmacist 06/07/2019 10:25 PM

## 2019-06-07 NOTE — Consult Note (Addendum)
Consultation Note Date: 06/07/2019   Patient Name: Lance Mason  DOB: August 09, 1956  MRN: [id number]  Age / Sex: 63 y.o., male  PCP: Patient, No Pcp Per Referring Physician: Erick BlinksMemon, Jehanzeb, MD  Reason for Consultation: Establishing goals of care  HPI/Patient Profile: 63 y.o. male  with past medical history of T2DM, HTN, and obesity admitted on 05/12/2019 with dyspnea, fever, and hypoxia. Diagnosed with Covid-19 pneumonia and ultimately required intubation.  Experienced cardiac arrest 6/11-6/12 which led to bilateral lower extremity ischemia. Thought that he will eventually require bilateral amputations. Patient has had increased ventilator needs and worsening hypoxia - family agreed to DNR after discussion with CCM on 6/15. PMT consulted to continue GOC conversations.   Clinical Assessment and Goals of Care: I have reviewed medical records including EPIC notes, labs and imaging, and received report from RN.   RN reports desaturations on high FiO2 (80-100%)  today, GCS of 3. Tells me he "looks miserable".  I used the interpreter service line to speak with patient's wife, 4 daughters, and niece to discuss diagnosis prognosis, GOC, EOL wishes, disposition and options.  I introduced Palliative Medicine as specialized medical care for people living with serious illness. It focuses on providing relief from the symptoms and stress of a serious illness. The goal is to improve quality of life for both the patient and the family.  They all confirm that prior to illness patient was "healthy" and independent - discuss how difficult it is to accept his current condition.   We discussed his current illness and what it means in the larger context of his on-going co-morbidities.  Natural disease trajectory and expectations at EOL were discussed. Specifically discussed concerns about patient's dependence on ventilator, poor mental status - possible anoxic  brain injury, and lower limb ischemia.   Much time is spent with family speaking amongst themselves about their concerns. I asked them to consider what the patient would want if he could speak for himself right now. We discussed concerns that patient may not survive hospitalization and if he does concerns about poor quality of life moving forward.   We detailed options moving forward - continuing aggressive care unsure if patient would survive, and if he did needing trach/peg and facility placement, bilateral lower extremity amputations. We also discussed a comfort path - liberating patient from ventilator and allowing a natural death. Family members discussed these options amongst themselves at length and eventually said they felt that the patient would prefer a comfort focused path and liberation from ventilator. All members seemed to agree.  Family's main concern was visitation - they want wife and children to be with patient as he passes - we discussed the visitor policy d/t COVID-19. Discussed visitor policy with RN and Dr. Kendrick FriesMcQuaid. 2 visitors will be allowed at bedside while patient remains intubated.   Family tells me they need time to decide which 2 family members will be at bedside. Also they want to speak about their decision with patient's brothers in [place]. They are unsure when they will be at the hospital for extubation. They request we continue current measures for now.   Questions and concerns were addressed. The family was encouraged to call with questions or concerns.   Primary Decision Maker NEXT OF KIN - wife joined by 4 daughters    SUMMARY OF RECOMMENDATIONS   - family agreeable to compassionate extubation, they want to visit patient prior to extubation and do not yet know when they are coming to the hospital -  they have my contact info to call back  - have discussed above with Dr. Lake Bells - move forward with extubation when family is ready, continue current care until then   Code Status/Advance Care Planning:  DNR   Symptom Management:   Per CCM   Palliative Prophylaxis:   Delirium Protocol, Frequent Pain Assessment, Oral Care and Turn Reposition  Additional Recommendations (Limitations, Scope, Preferences):  No Tracheostomy   Prognosis:   Hours - Days  Discharge Planning: Anticipated Hospital Death      Primary Diagnoses: Present on Admission: . Pure hypercholesterolemia . (Resolved) Sepsis ([place]) . CAP (community acquired pneumonia)   I have reviewed the medical record, interviewed the patient and family, and examined the patient. The following aspects are pertinent.  Past Medical History:  Diagnosis Date  . Cataract   . Diabetes mellitus without complication ([place])   . Hyperlipidemia   . Hypertension   . Pleural plaque 10/05/2015   calcified pleural plaque bilaterally on CT chest   Social History   Socioeconomic History  . Marital status: Married    Spouse name: Not on file  . Number of children: 7  . Years of education: Not on file  . Highest education level: Not on file  Occupational History    Comment: painting  Social Needs  . Financial resource strain: Not on file  . Food insecurity    Worry: Not on file    Inability: Not on file  . Transportation needs    Medical: Not on file    Non-medical: Not on file  Tobacco Use  . Smoking status: Former Smoker    Packs/day: 0.50    Years: 5.00    Pack years: 2.50    Types: Cigarettes    Quit date: 12/19/1994    Years since quitting: 24.4  . Smokeless tobacco: Never Used  Substance and Sexual Activity  . Alcohol use: Yes    Alcohol/week: 0.0 standard drinks    Comment: occasionally beer  . Drug use: No  . Sexual activity: Not on file  Lifestyle  . Physical activity    Days per week: Not on file    Minutes per session: Not on file  . Stress: Not on file  Relationships  . Social Herbalist on phone: Not on file    Gets together: Not on file     Attends religious service: Not on file    Active member of club or organization: Not on file    Attends meetings of clubs or organizations: Not on file    Relationship status: Not on file  Other Topics Concern  . Not on file  Social History Narrative   Marital status: married      Children: seven children      Employment: Curator previously; working at [employer] in 2019.      Tobacco: quit smoking; non in 2019.      Alcohol: beer once per week.      Exercise: none   Family History  Problem Relation Age of Onset  . Heart disease Mother        pacemaker/arrhythmia.  . Hypertension Mother   . Heart disease Father    Scheduled Meds: . chlorhexidine  15 mL Mouth/Throat BID  . Chlorhexidine Gluconate Cloth  6 each Topical Daily  . collagenase   Topical Daily  . famotidine  20 mg Per Tube BID  . feeding supplement (PRO-STAT SUGAR FREE 64)  60 mL Per Tube BID  .  feeding supplement (VITAL AF 1.2 CAL)  1,000 mL Per Tube Q24H  . free water  300 mL Per Tube Q6H  . furosemide  40 mg Intravenous Q12H  . guaiFENesin-dextromethorphan  15 mL Per Tube Q6H  . insulin aspart  0-20 Units Subcutaneous Q4H  . insulin glargine  5 Units Subcutaneous QHS  . mouth rinse  15 mL Mouth Rinse 10 times per day  . metoprolol tartrate  25 mg Per Tube Q6H  . multivitamin  15 mL Per Tube Daily  . nutrition supplement (JUVEN)  1 packet Per Tube BID BM  . sodium chloride flush  10-40 mL Intracatheter Q12H   Continuous Infusions: . sodium chloride Stopped (06/07/19 1144)  . heparin 1,300 Units/hr (06/07/19 1700)  . HYDROmorphone 5 mg/hr (06/07/19 1600)  . midazolam 5 mg/hr (06/07/19 1600)   PRN Meds:.sodium chloride, acetaminophen, HYDROmorphone (DILAUDID) injection, ipratropium-albuterol, metoprolol tartrate, midazolam, ondansetron (ZOFRAN) IV, polyethylene glycol, polyvinyl alcohol, sodium chloride flush, vecuronium No Known Allergies  Vital Signs: BP 128/72   Pulse (!) 120   Temp (!) 97.3 F  (36.3 C) (Axillary)   Resp 20   Ht 5\' 5"  (1.651 m)   Wt 96.4 kg   SpO2 (!) 89%   BMI 35.37 kg/m  Pain Scale: CPOT   Pain Score: 0-No pain   SpO2: SpO2: (!) 89 % O2 Device:SpO2: (!) 89 % O2 Flow Rate: .O2 Flow Rate (L/min): 50 L/min  IO: Intake/output summary:   Intake/Output Summary (Last 24 hours) at 06/07/2019 1714 Last data filed at 06/07/2019 1700 Gross per 24 hour  Intake 2354.97 ml  Output 3715 ml  Net -1360.03 ml    LBM: Last BM Date: 06/07/19 Baseline Weight: Weight: 105.5 kg Most recent weight: Weight: 96.4 kg     Palliative Assessment/Data: PPS 10%    The above conversation was completed via telephone due to the visitor restrictions during the COVID-19 pandemic. Thorough chart review and discussion with necessary members of the care team was completed as part of assessment. All issues were discussed and addressed but no physical exam was performed.  Time Total:  100 minutes 11:45AM - 1:25 PM  Greater than 50%  of this time was spent counseling and coordinating care related to the above assessment and plan.  Gerlean RenShae Lee Hildagarde Holleran, DNP, AGNP-C Palliative Medicine Team [phone] Pager: [phone]

## 2019-06-07 NOTE — Progress Notes (Signed)
[facility]  Called patient's niece, no answer, left message  Roselie Awkward, MD [place] [facility] Pager: [phone] Cell: [phone] If no response, call [phone]

## 2019-06-07 NOTE — Progress Notes (Signed)
[facility]  Discussed with palliative medicine.  They were having a long conversation with the family when I tried calling earlier.  The family wants to withdraw care, would like to visit him prior.  Waiting for final decision on timing of this with family.  OK from my perspective for 1-2 visitors to see him prior to withdrawal of care.  Roselie Awkward, MD [place] [facility] Pager: [phone] Cell: [phone] If no response, call [phone]

## 2019-06-07 NOTE — Progress Notes (Signed)
ANTICOAGULATION CONSULT NOTE - Follow Up Consult  Pharmacy Consult for Heparin IV Indication: atrial fibrillation  No Known Allergies  Patient Measurements: Height: 5\' 5"  (165.1 cm) Weight: 212 lb 8.4 oz (96.4 kg) IBW/kg (Calculated) : 61.5 Heparin Dosing Weight: 85 kg  Vital Signs: Temp: 99 F (37.2 C) (06/19 0500) Temp Source: Oral (06/19 0500) BP: 124/71 (06/19 0630) Pulse Rate: 114 (06/19 0630)  Labs: Recent Labs    06/05/19 0215  06/05/19 1147  06/05/19 2020 06/06/19 0400 06/07/19 0450  HGB 10.3*   < > 10.9*  --   --  10.2* 9.5*  HCT 33.5*   < > 32.0*  --   --  35.0* 32.6*  PLT 128*  --   --   --   --  147* 146*  HEPARINUNFRC 0.93*  --   --    < > 0.25* 0.36 0.18*  CREATININE 0.80  --   --   --  0.87 0.77  --    < > = values in this interval not displayed.    Estimated Creatinine Clearance: 100.9 mL/min (by C-G formula based on SCr of 0.77 mg/dL).    Assessment: 41 yoM admitted on 5/27 with COVID-19 hypoxic respiratory failure, now with a prolonged hospitalization and currently intubated. He developed Afib, poorly rate controlled on diltiazem, now on Amio IV. Pharmacy consulted to start IV heparin. VTE prophylaxis this admission was Lovenox 50 mg BID; 5/27 - 6/14  Heparin level down to 0.18. No issues noted. Hgb slightly down to 9.5 Plt stable at 146. No bleeding reported.  Goal of Therapy:  Heparin level 0.3-0.7 units/ml Monitor platelets by anticoagulation protocol: Yes   Plan:  Increase heparin IV infusion to 1,200 units/hr Monitor daily heparin level, CBC, s/s of bleed  Elenor Quinones, PharmD, BCPS, BCIDP Clinical Pharmacist 06/07/2019 6:58 AM

## 2019-06-07 NOTE — Progress Notes (Signed)
ANTICOAGULATION CONSULT NOTE - Follow Up Consult  Pharmacy Consult for Heparin IV Indication: atrial fibrillation  No Known Allergies  Patient Measurements: Height: 5\' 5"  (165.1 cm) Weight: 212 lb 8.4 oz (96.4 kg) IBW/kg (Calculated) : 61.5 Heparin Dosing Weight: 85 kg  Vital Signs: Temp: 97.3 F (36.3 C) (06/19 1130) Temp Source: Axillary (06/19 1130) BP: 104/67 (06/19 1330) Pulse Rate: 100 (06/19 1330)  Labs: Recent Labs    06/05/19 0215  06/05/19 2020 06/06/19 0400 06/07/19 0425 06/07/19 0450  HGB 10.3*   < >  --  10.2* 12.9* 9.5*  HCT 33.5*   < >  --  35.0* 38.0* 32.6*  PLT 128*  --   --  147*  --  146*  HEPARINUNFRC 0.93*   < > 0.25* 0.36  --  0.18*  CREATININE 0.80  --  0.87 0.77  --  0.75   < > = values in this interval not displayed.    Estimated Creatinine Clearance: 100.9 mL/min (by C-G formula based on SCr of 0.75 mg/dL).    Assessment: 43 yoM admitted on 5/27 with COVID-19 hypoxic respiratory failure, now with a prolonged hospitalization and currently intubated. He developed Afib, poorly rate controlled on diltiazem, now on Amio IV. Pharmacy consulted to start IV heparin. VTE prophylaxis this admission was Lovenox 50 mg BID; 5/27 - 6/14  Today, 06/07/2019:  Heparin level 0.22, subtherapeutic on Heparin at 1200 units/hr  Hgb decreased to 9.5, Plt up to 146.    No bleeding or complications reported.   Goal of Therapy:  Heparin level 0.3-0.7 units/ml Monitor platelets by anticoagulation protocol: Yes   Plan:  Increase to heparin IV infusion at 1300 units/hr Heparin level in 6 hours to confirm therapeutic level. Daily heparin level and CBC, monitor for s/s bleeding.     Gretta Arab PharmD, BCPS Clinical Pharmacist Clinical pharmacist phone 7am- 5pm: [phone] 06/07/2019 2:16 PM

## 2019-06-07 NOTE — Progress Notes (Signed)
Update was given to [facility], with the rest of the family on a conference call. Updates given on vital signs and ventilator settings. Emotional support given to family.

## 2019-06-07 NOTE — Progress Notes (Signed)
Spoke with Lance Mason via phone who requested updates. Advised that there had been no changes since she last spoke with the off going nurse. Allowed FaceTime video with 5 of his family members. Answered all questions and they again thank [place] for all the care he's receiving.   Per off going nurse, family is requesting a chest xray tomorrow and are asking if someone can call them to discuss how it looks.

## 2019-06-07 NOTE — Progress Notes (Signed)
PROGRESS NOTE  Lance Mason  ZOX:[id number] DOB: Oct 24, 1956 DOA: 05/05/2019 PCP: Patient, No Pcp Per   Brief Narrative: Lance Mason is a 63 y.o. male with a history of T2DM, HTN, obesity who presented with cough and shortness of breath and fever found to be hypoxic requiring NRB. CXR demonstrated bilateral opacities and [facility] cov[facility]-19 pneumonia, ultimately intubated.  Assessment & Plan: Principal Problem:   Acute respiratory failure with hypoxemia (HCC) Active Problems:   Type 2 diabetes mellitus without complication, without long-term current use of insulin (HCC)   Pure hypercholesterolemia   Pneumonia due to COVID-19 virus   CAP (community acquired pneumonia)   Abnormal liver function   Pressure injury of skin  Acute hypoxic respiratory failure due to covid-19 pneumonia: Reintubated 6/10 - Continue vent support per PCCM - Check daily labs: CBC w/diff, CMP, d-dimer, fibrinogen, ferritin, LDH, CRP - Avoid NSAIDs - s/p actemra 5/27, convalescent plasma 5/29, remdesivir 5/27 - 5/31. - s/p abx x7 days.   Cardiac arrest 6/11-6/12: With suspected perfusion limitation causing bilateral lower extremity arterial insufficiency/ischemia:  - CT head due to possible anoxic brain injury was nonacute.   Lower extremity ischemia: Anticipate this will develop into necrosis and ultimately require bilateral amputations.  - Poor prognosis. Discussed with wife that this also bodes poorly for cerebral recovery.  Aspiration/HCAP:  -He has been treated with an adequate course of cefepime/Flagyl and Zosyn.  No fevers.  Antibiotics have since been discontinued - Need to continue tube feeds, elevate HOB  Oral candidiasis: - Has been given fluconazole x7 days.  T2DM: HbA1c 9.9%.  - Hold home metformin, glipizide - Continue covering with SSI -Blood sugars have been fairly stable  AFib with RVR:  -Patient was started on amiodarone infusion and converted to sinus  rhythm -Amiodarone has since been discontinued and he is currently on beta-blockers.  Remains in sinus rhythm. - Keep K and Mg wnl -He is anticoagulated with heparin  Obesity: BMI: 38. Contributes to poor prognosis - Optimize nutritional status per dietitian  Hypernatremia: Resolved.  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  - WOC was consulted and recommended enzymatic debridement to the central wound area as well as air mattress.    DVT prophylaxis: Heparin infusion Code Status: DNR Family Communication: Due to worsening ventilator needs/hypoxia and progressive lower extremity cyanosis/impending gangrene indicating poor prognosis, family was contacted 6/15 agreeing to DNR status. PCCM discussed with family 6/16 and 6/17 and the family wishes to continue current measures, no escalation of care. Palliative care consult to help address goals of care  Disposition Plan: Remain in ICU  Consultants:   PCCM  Procedures:   5/29 ETT  5/29 - 6/10 L subclavian central line  6/10 PICC  Antimicrobials:  Azithromycin 5/26 >> 6/2  Cefepime 5/27 >> 6/2 and 6/7 >>d/c  Fluconazole 6/2 >> 6/9  Remdesivir 5/27 >> 5/31  Vancomycin 5/26 >> 5/28  And 6/7 >> 6/11  Metronidazole 6/11   Zosyn 6/12 >>6/17  Subjective: Intubated and sedated  Objective: Vitals:   06/07/19 1530 06/07/19 1558 06/07/19 1600 06/07/19 1630  BP: 138/70 138/70 136/70 128/68  Pulse: (!) 101 (!) 125 (!) 123 (!) 108  Resp: (!) 25 20 (!) 21 20  Temp: (!) 97.3 F (36.3 C)     TempSrc: Axillary     SpO2: 92% 90% 90% (!) 87%  Weight:  Height:        Intake/Output Summary (Last 24 hours) at 06/07/2019 1646 Last data filed at 06/07/2019 1600 Gross per 24 hour  Intake 2354.2 ml  Output 3655 ml  Net -1300.8 ml   General exam: Intubated, sedated Respiratory system: Clear to auscultation. Respiratory effort normal. Cardiovascular system:RRR. No  murmurs, rubs, gallops. Gastrointestinal system: Abdomen is nondistended, soft and nontender. No organomegaly or masses felt. Normal bowel sounds heard. Central nervous system: Unable to assess Extremities: 1+ edema bilaterally Skin: Purple discoloration of toes bilaterally, cool to touch Psychiatry: Unable to assess      Data Reviewed: I have personally reviewed following labs and imaging studies  CBC: Recent Labs  Lab 06/03/19 0450  06/04/19 0130  06/05/19 0215  06/05/19 0859 06/05/19 1147 06/06/19 0400 06/07/19 0425 06/07/19 0450  WBC 10.2  --  10.0  --  8.0  --   --   --  7.2  --  7.8  HGB 11.5*   < > 11.4*   < > 10.3*   < > 12.2* 10.9* 10.2* 12.9* 9.5*  HCT 37.1*   < > 37.2*   < > 33.5*   < > 36.0* 32.0* 35.0* 38.0* 32.6*  MCV 102.2*  --  104.5*  --  104.7*  --   --   --  108.4*  --  108.7*  PLT 150  --  142*  --  128*  --   --   --  147*  --  146*   < > = values in this interval not displayed.   Basic Metabolic Panel: Recent Labs  Lab 05/31/19 2047 06/01/19 0500  06/02/19 0403  06/04/19 0130  06/05/19 0215  06/05/19 0515  06/05/19 1147 06/05/19 2020 06/06/19 0400 06/07/19 0425 06/07/19 0450  NA 153* 151*   < > 148*   < > 149*   < > 149*   < >  --    < > 148* 148* 148* 144 144  K 3.1* 2.9*   < > 3.7   < > 3.9   < > 2.9*   < >  --    < > 4.1 4.6 3.8 3.5 3.6  CL 113* 97*  --  108   < > 101  --  99  --   --   --   --  97* 95*  --  91*  CO2 34* 46*  --  34*   < > 36*  --  38*  --   --   --   --  41* 43*  --  44*  GLUCOSE 79 88  --  124*   < > 210*  --  212*  --   --   --   --  245* 155*  --  156*  BUN 34* 25*  --  37*   < > 48*  --  40*  --   --   --   --  49* 44*  --  46*  CREATININE 0.88 0.69  --  0.98   < > 0.81  --  0.80  --   --   --   --  0.87 0.77  --  0.75  CALCIUM 6.6* 5.3*  --  7.1*   < > 7.5*  --  7.4*  --   --   --   --  8.1* 8.1*  --  8.0*  MG 2.4  --   --  2.2  --   --   --   --   --  1.9  --   --   --   --   --   --   PHOS 1.0* 1.3*  --  3.4  --    --   --   --   --   --   --   --   --   --   --   --    < > = values in this interval not displayed.   GFR: Estimated Creatinine Clearance: 100.9 mL/min (by C-G formula based on SCr of 0.75 mg/dL). Liver Function Tests: Recent Labs  Lab 06/02/19 0403 06/03/19 0450 06/04/19 0130 06/05/19 0215 06/07/19 0450  AST 129* 95* 74* 57* 61*  ALT 122* 121* 92* 61* 64*  ALKPHOS 570* 539* 530* 438* 482*  BILITOT 0.7 0.1* 0.2* 0.4 0.2*  PROT 5.7* 6.2* 6.6 5.9* 6.4*  ALBUMIN 2.1* 2.1* 2.2* 1.8* 1.8*   No results for input(s): LIPASE, AMYLASE in the last 168 hours. No results for input(s): AMMONIA in the last 168 hours. Coagulation Profile: No results for input(s): INR, PROTIME in the last 168 hours. Cardiac Enzymes: Recent Labs  Lab 06/01/19 0950  TROPONINI 0.06*   BNP (last 3 results) No results for input(s): PROBNP in the last 8760 hours. HbA1C: No results for input(s): HGBA1C in the last 72 hours. CBG: Recent Labs  Lab 06/06/19 2335 06/07/19 0326 06/07/19 0741 06/07/19 1139 06/07/19 1510  GLUCAP 151* 129* 188* 221* 198*   Lipid Profile: No results for input(s): CHOL, HDL, LDLCALC, TRIG, CHOLHDL, LDLDIRECT in the last 72 hours. Thyroid Function Tests: No results for input(s): TSH, T4TOTAL, FREET4, T3FREE, THYROIDAB in the last 72 hours. Anemia Panel: No results for input(s): VITAMINB12, FOLATE, FERRITIN, TIBC, IRON, RETICCTPCT in the last 72 hours. Urine analysis:    Component Value Date/Time   COLORURINE YELLOW 05/28/2016 2056   APPEARANCEUR CLEAR 05/28/2016 2056   LABSPEC 1.041 (H) 05/28/2016 2056   PHURINE 5.0 05/28/2016 2056   GLUCOSEU >1000 (A) 05/28/2016 2056   HGBUR NEGATIVE 05/28/2016 2056   BILIRUBINUR negative 06/19/2018 1601   KETONESUR negative 06/19/2018 1601   KETONESUR 15 (A) 05/28/2016 2056   PROTEINUR negative 06/19/2018 1601   PROTEINUR NEGATIVE 05/28/2016 2056   UROBILINOGEN 0.2 06/19/2018 1601   NITRITE Negative 06/19/2018 1601   NITRITE  NEGATIVE 05/28/2016 2056   LEUKOCYTESUR Negative 06/19/2018 1601   Recent Results (from the past 240 hour(s))  Culture, blood (Routine X 2) w Reflex to ID Panel     Status: None   Collection Time: 06/02/19  5:45 AM   Specimen: BLOOD  Result Value Ref Range Status   Specimen Description   Final    BLOOD LEFT ARM Performed at [facility], [address] [place], [place] [address]    Special Requests   Final    BOTTLES DRAWN AEROBIC ONLY Blood Culture adequate volume Performed at [facility], [address] [place], [place] [address]    Culture   Final    NO GROWTH 5 DAYS Performed at [facility], [address] [place], [place] [address]    Report Status 06/07/2019 FINAL  Final  Culture, blood (Routine X 2) w Reflex to ID Panel     Status: None   Collection Time: 06/02/19  5:50 AM   Specimen: BLOOD  Result Value Ref Range Status   Specimen Description   Final    BLOOD LEFT HAND Performed at [facility], [address] [place], [place]  [address]    Special Requests   Final    BOTTLES DRAWN AEROBIC ONLY Blood Culture adequate volume Performed at [place] [address] [place], [place] [address]    Culture   Final    NO GROWTH 5 DAYS Performed at [facility], [place] [address], [place], [place] [address]    Report Status 06/07/2019 FINAL  Final  Culture, respiratory (non-expectorated)     Status: None   Collection Time: 06/02/19 10:07 AM   Specimen: Tracheal Aspirate; Respiratory  Result Value Ref Range Status   Specimen Description   Final    TRACHEAL ASPIRATE Performed at [place] [address], [place], [place] [address]    Special Requests   Final    NONE Performed at [facility], [place] [address] [place], [place] [address]    Gram Stain   Final    RARE WBC PRESENT, PREDOMINANTLY PMN NO ORGANISMS SEEN    Culture   Final     NO GROWTH 2 DAYS Performed at [place] [address], [place], [place] [address]    Report Status 06/04/2019 FINAL  Final      Radiology Studies: No results found.  Scheduled Meds: . chlorhexidine  15 mL Mouth/Throat BID  . Chlorhexidine Gluconate Cloth  6 each Topical Daily  . collagenase   Topical Daily  . famotidine  20 mg Per Tube BID  . feeding supplement (PRO-STAT SUGAR FREE 64)  60 mL Per Tube BID  . feeding supplement (VITAL AF 1.2 CAL)  1,000 mL Per Tube Q24H  . free water  300 mL Per Tube Q6H  . furosemide  40 mg Intravenous Q12H  . guaiFENesin-dextromethorphan  15 mL Per Tube Q6H  . insulin aspart  0-20 Units Subcutaneous Q4H  . insulin glargine  5 Units Subcutaneous QHS  . mouth rinse  15 mL Mouth Rinse 10 times per day  . metoprolol tartrate  25 mg Per Tube Q6H  . multivitamin  15 mL Per Tube Daily  . nutrition supplement (JUVEN)  1 packet Per Tube BID BM  . sodium chloride flush  10-40 mL Intracatheter Q12H   Continuous Infusions: . sodium chloride Stopped (06/07/19 1144)  . heparin 1,300 Units/hr (06/07/19 1600)  . HYDROmorphone 5 mg/hr (06/07/19 1600)  . midazolam 5 mg/hr (06/07/19 1600)     LOS: 24 days   Critical care Time spent: 35 minutes.  Kathie Dike, MD [facility] [email or web address] Password [facility] 06/07/2019, 4:46 PM

## 2019-06-07 NOTE — Progress Notes (Signed)
Spoke at length with niece Lavone Neri and the rest of the family. Family states that they no longer want to do palliative care, that they do not want someone from palliative care calling them again. Family wishes to continue to, due to knowing a previous patient that survived COVID-24 at an older age.

## 2019-06-08 ENCOUNTER — Inpatient Hospital Stay (HOSPITAL_COMMUNITY): Payer: BLUE CROSS/BLUE SHIELD

## 2019-06-08 LAB — CBC
HCT: 33.2 % — ABNORMAL LOW (ref 39.0–52.0)
Hemoglobin: 9.9 g/dL — ABNORMAL LOW (ref 13.0–17.0)
MCH: 32.1 pg (ref 26.0–34.0)
MCHC: 29.8 g/dL — ABNORMAL LOW (ref 30.0–36.0)
MCV: 107.8 fL — ABNORMAL HIGH (ref 80.0–100.0)
Platelets: 173 10*3/uL (ref 150–400)
RBC: 3.08 MIL/uL — ABNORMAL LOW (ref 4.22–5.81)
RDW: 18.1 % — ABNORMAL HIGH (ref 11.5–15.5)
WBC: 8 10*3/uL (ref 4.0–10.5)
nRBC: 1.5 % — ABNORMAL HIGH (ref 0.0–0.2)

## 2019-06-08 LAB — COMPREHENSIVE METABOLIC PANEL
ALT: 54 U/L — ABNORMAL HIGH (ref 0–44)
AST: 53 U/L — ABNORMAL HIGH (ref 15–41)
Albumin: 2 g/dL — ABNORMAL LOW (ref 3.5–5.0)
Alkaline Phosphatase: 392 U/L — ABNORMAL HIGH (ref 38–126)
Anion gap: 10 (ref 5–15)
BUN: 42 mg/dL — ABNORMAL HIGH (ref 8–23)
CO2: 44 mmol/L — ABNORMAL HIGH (ref 22–32)
Calcium: 8.1 mg/dL — ABNORMAL LOW (ref 8.9–10.3)
Chloride: 89 mmol/L — ABNORMAL LOW (ref 98–111)
Creatinine, Ser: 0.72 mg/dL (ref 0.61–1.24)
GFR calc Af Amer: >60 mL/min (ref >60)
GFR calc non Af Amer: >60 mL/min (ref >60)
Glucose, Bld: 239 mg/dL — ABNORMAL HIGH (ref 70–99)
Potassium: 3.4 mmol/L — ABNORMAL LOW (ref 3.5–5.1)
Sodium: 143 mmol/L (ref 135–145)
Total Bilirubin: 0.2 mg/dL — ABNORMAL LOW (ref 0.3–1.2)
Total Protein: 7.2 g/dL (ref 6.5–8.1)

## 2019-06-08 LAB — GLUCOSE, CAPILLARY
Glucose-Capillary: 157 mg/dL — ABNORMAL HIGH (ref 70–99)
Glucose-Capillary: 177 mg/dL — ABNORMAL HIGH (ref 70–99)
Glucose-Capillary: 185 mg/dL — ABNORMAL HIGH (ref 70–99)
Glucose-Capillary: 186 mg/dL — ABNORMAL HIGH (ref 70–99)
Glucose-Capillary: 195 mg/dL — ABNORMAL HIGH (ref 70–99)
Glucose-Capillary: 214 mg/dL — ABNORMAL HIGH (ref 70–99)

## 2019-06-08 LAB — HEPARIN LEVEL (UNFRACTIONATED): Heparin Unfractionated: 0.28 IU/mL — ABNORMAL LOW (ref 0.30–0.70)

## 2019-06-08 MED ORDER — INSULIN GLARGINE 100 UNIT/ML ~~LOC~~ SOLN
15.0000 [IU] | Freq: Every day | SUBCUTANEOUS | Status: DC
  Administered 2019-06-08 – 2019-06-09 (×2): 15 [IU] via SUBCUTANEOUS
  Filled 2019-06-08 (×2): qty 0.15

## 2019-06-08 MED ORDER — ENOXAPARIN SODIUM 100 MG/ML ~~LOC~~ SOLN
100.0000 mg | Freq: Two times a day (BID) | SUBCUTANEOUS | Status: DC
  Administered 2019-06-08 – 2019-06-09 (×3): 100 mg via SUBCUTANEOUS
  Filled 2019-06-08 (×6): qty 1

## 2019-06-08 NOTE — Progress Notes (Signed)
ANTICOAGULATION CONSULT NOTE - Follow Up Consult  Pharmacy Consult for Enoxaparin Indication: atrial fibrillation  No Known Allergies  Patient Measurements: Height: 5\' 5"  (165.1 cm) Weight: 228 lb 9.9 oz (103.7 kg) IBW/kg (Calculated) : 61.5 Heparin Dosing Weight: 85 kg  Vital Signs: Temp: 99.4 F (37.4 C) (06/20 0800) Temp Source: Oral (06/20 0800) BP: 111/65 (06/20 0800) Pulse Rate: 84 (06/20 0800)  Labs: Recent Labs    06/06/19 0400 06/07/19 0425 06/07/19 0450 06/07/19 1405 06/07/19 2055 06/08/19 0110  HGB 10.2* 12.9* 9.5*  --   --  9.9*  HCT 35.0* 38.0* 32.6*  --   --  33.2*  PLT 147*  --  146*  --   --  173  HEPARINUNFRC 0.36  --  0.18* 0.22* 0.27* 0.28*  CREATININE 0.77  --  0.75  --   --  0.72    Estimated Creatinine Clearance: 104.8 mL/min (by C-G formula based on SCr of 0.72 mg/dL).    Assessment: 39 yoM admitted on 5/27 with COVID-19 hypoxic respiratory failure, now with a prolonged hospitalization and currently intubated. He developed Afib and Pharmacy was consulted to start IV heparin, now transition to Lovenox given stable renal function, CBC, and less lab draws.  Today, 06/08/2019:  Previous HL 0.28 on heparin at 1350 units/hr  Hgb increased to 9.9, Plt up to 173  No bleeding or complications reported.   Goal of Therapy:  Heparin level 0.3-0.7 units/ml Monitor platelets by anticoagulation protocol: Yes   Plan:   Stop heparin drip at 12:00  One hour later, start Lovenox 1 mg/kg (100mg ) SQ BID.  Continue to f/u renal function, CBC, GOC plans.   Gretta Arab PharmD, BCPS Clinical Pharmacist Clinical pharmacist phone 7am- 5pm: [phone] 06/08/2019 9:27 AM

## 2019-06-08 NOTE — Progress Notes (Addendum)
[facility] PCCM  Progressive dyspnea, increased work of breathing, hypoxemia I called his family to discuss. I explained he is suffering and will not survive the next several days. I explained we will not escalate care further: no pressors, no new lines, no new tubes.  We will focus on comfort.  They still don't want [place] to withdraw care.  We will continue the current level of care.   Giving vecuronium for better synchrony now.  Roselie Awkward, MD [place] [facility] Pager: [phone] Cell: [phone] If no response, call [phone]

## 2019-06-08 NOTE — Progress Notes (Signed)
NAME:  Lance Mason, MRN:  [id number], DOB:  1955-12-27, LOS: 38 ADMISSION DATE:  2019-06-08, CONSULTATION DATE:  May 15, 2019 REFERRING MD:  Roderic Palau, CHIEF COMPLAINT:  Dyspnea   Brief History   63 year old male with type 2 diabetes, hypertension admitted on May 26 with acute respiratory failure with hypoxemia due to COVID-19 pneumonia.   Past Medical History  Hypertension Hyperlipidemia DM 2  Significant Hospital Events   May 26 admission May 29 intubation, mechanical ventilation in setting of profound encephalopathy May 31 worsening dyssynchrony, hypoxemia, added vecuronium June 1 chemical paralysis used overnight  June 2-5 weaning FiO2/PEEP June 6 weaning well on pressure support June 7 worsening oxygenation/tachycardia fever, started on antibiotics again June 9 following commands, weaning, extubated June 10 vomited, hypoxemic, re-intubated June 12 cardiac arrest. Stopped proning due to isntability June 14 Atrial fibrillation, worsening ventialtor settings June 15 made DNR June 19 palliative medicine consult> family wants to withdraw care June 20 family wants to continue supportive care, doesn't want to withdraw  Consults:  Pulmonary and critical care medicine  Procedures:  Endotracheal tube May 29> June 9, June 10 >> Left subclavian central venous line May 29> June 10 June 10 PICC>   Significant Diagnostic Tests:  CT angiogram chest May 27 diffuse bilateral patchy airspace disease groundglass upper lobes predominant, no pulmonary embolism June 1 bilateral lower ext doppler > neg DVT June 1 TTE >>>LVEF > 65%, RV normal but poorly visualized CT head 6/7 > NAICP CT chest 6/8 > No PE, findings consistent with viral pneumonia, increased RLL consolidation, reactive subcarinal lymph node  Micro Data:  May 21 SARS-CoV-2 positive May 26 blood cultures June 7 resp culture > GPC, GNR, GPR > OPF June 7 blood culture > Neg  June 14 Blood cx >> neg June 14 Sputum Cx  >> neg  Antimicrobials/COVID treatment:   May 26 azithromycin > June 2 May 27 cefepime > June 2 May 27 vancomycin > May 31 June 2 Fluconazole >  May 27 remdesivir May 26 solumedrol > 6/6 May 27 Tocilizumab x1 5/27 May 29 Convalescent plasma June 7 Vanc > 6/9 June 7 Cefepime > 6/9, restart 6/10 > 6/12 June 10 Flagyl > 6/12  Zosyn 6/12 >> 6/17  Interim history/subjective:   Palliative medicine consultation yesterday: Family ready to withdraw care However, they indicate to nursing this morning that they do not want to talk to the palliative medicine team anymore and they are optimistic that the patient will be able to survive Remains on high ventilator support   Objective   Blood pressure 122/71, pulse 85, temperature 99.2 F (37.3 C), temperature source Oral, resp. rate (!) 21, height 5\' 5"  (1.651 m), weight 103.7 kg, SpO2 95 %.    Vent Mode: PRVC FiO2 (%):  [80 %-100 %] 100 % Set Rate:  [35 bmp] 35 bmp Vt Set:  [370 mL] 370 mL PEEP:  [16 cmH20-20 cmH20] 20 cmH20 Plateau Pressure:  [33 cmH20-35 cmH20] 33 cmH20   Intake/Output Summary (Last 24 hours) at 06/08/2019 0755 Last data filed at 06/08/2019 0600 Gross per 24 hour  Intake 2207.09 ml  Output 3530 ml  Net -1322.91 ml   Filed Weights   06/05/19 0500 06/08/19 0500  Weight: 96.4 kg 103.7 kg    Examination:  General:  In bed on vent HENT: NCAT ETT in place PULM: CTA B, vent supported breathing CV: RRR, no mgr GI: BS+, soft, nontender MSK: normal bulk and tone Neuro: sedated on vent Derm: toes  cyanotic bilaterally    Resolved Hospital Problem list     Assessment & Plan:  ARDS due to COVID pneumonia: no significant progress Continue ARDS ventilator settings, targeting tidal volume 6 to 8 cc/kg ideal body weight, keeping plateau pressure less than 30 cmH2O Hold prone positioning Continue pressure control for ventilator synchrony, monitor tidal volume closely Wean PEEP and FiO2 to maintain SaO2 greater  than 88%  Aspiration/healthcare associated pneumonia: Monitor off of antibiotics  Atrial fibrillation: Telemetry monitoring Hold amiodarone  Need for sedation/ventilator synchrony ICU Delirium Continue quetiapine Monitor QTC RA SS target -2  Bilateral ischemic/gangrenous toes: Continue supportive care Continue to discuss goals of care with family who have indicated that they would not want amputation  Overall prognosis:  Poor, discussed with family today.  I let them know that he will most likely die after spending weeks on a ventilator.  They wish to continue full supportive care.  They do not know what they think he would say if asked about prolonged care.  They want to do "everything possible" to support him through this illness.  CODE STATUS: remains DNR, he would not survive cardiac arrest  Best practice:  Diet: tube feeding Pain/Anxiety/Delirium protocol (if indicated): dilaudid, versed infusion, RASS goal -2 VAP protocol (if indicated): yes DVT prophylaxis: heparin infusion GI prophylaxis: famotidine Glucose control: SSI, Lantus  Mobility: passive range of motion in bed Code Status: Full Family Communication: will reach out to family again toady Disposition: remain in ICU  Labs   CBC: Recent Labs  Lab 06/04/19 0130  06/05/19 0215  06/05/19 1147 06/06/19 0400 06/07/19 0425 06/07/19 0450 06/08/19 0110  WBC 10.0  --  8.0  --   --  7.2  --  7.8 8.0  HGB 11.4*   < > 10.3*   < > 10.9* 10.2* 12.9* 9.5* 9.9*  HCT 37.2*   < > 33.5*   < > 32.0* 35.0* 38.0* 32.6* 33.2*  MCV 104.5*  --  104.7*  --   --  108.4*  --  108.7* 107.8*  PLT 142*  --  128*  --   --  147*  --  146* 173   < > = values in this interval not displayed.    Basic Metabolic Panel: Recent Labs  Lab 06/02/19 0403  06/05/19 0215  06/05/19 0515  06/05/19 2020 06/06/19 0400 06/07/19 0425 06/07/19 0450 06/08/19 0110  NA 148*   < > 149*   < >  --    < > 148* 148* 144 144 143  K 3.7   < > 2.9*    < >  --    < > 4.6 3.8 3.5 3.6 3.4*  CL 108   < > 99  --   --   --  97* 95*  --  91* 89*  CO2 34*   < > 38*  --   --   --  41* 43*  --  44* 44*  GLUCOSE 124*   < > 212*  --   --   --  245* 155*  --  156* 239*  BUN 37*   < > 40*  --   --   --  49* 44*  --  46* 42*  CREATININE 0.98   < > 0.80  --   --   --  0.87 0.77  --  0.75 0.72  CALCIUM 7.1*   < > 7.4*  --   --   --  8.1* 8.1*  --  8.0* 8.1*  MG 2.2  --   --   --  1.9  --   --   --   --   --   --   PHOS 3.4  --   --   --   --   --   --   --   --   --   --    < > = values in this interval not displayed.   GFR: Estimated Creatinine Clearance: 104.8 mL/min (by C-G formula based on SCr of 0.72 mg/dL). Recent Labs  Lab 06/01/19 0950  06/05/19 0215 06/06/19 0400 06/07/19 0450 06/08/19 0110  WBC  --    < > 8.0 7.2 7.8 8.0  LATICACIDVEN 1.4  --   --   --   --   --    < > = values in this interval not displayed.    Liver Function Tests: Recent Labs  Lab 06/03/19 0450 06/04/19 0130 06/05/19 0215 06/07/19 0450 06/08/19 0110  AST 95* 74* 57* 61* 53*  ALT 121* 92* 61* 64* 54*  ALKPHOS 539* 530* 438* 482* 392*  BILITOT 0.1* 0.2* 0.4 0.2* 0.2*  PROT 6.2* 6.6 5.9* 6.4* 7.2  ALBUMIN 2.1* 2.2* 1.8* 1.8* 2.0*   No results for input(s): LIPASE, AMYLASE in the last 168 hours. No results for input(s): AMMONIA in the last 168 hours.  ABG    Component Value Date/Time   PHART 7.410 06/07/2019 0425   PCO2ART 78.9 (HH) 06/07/2019 0425   PO2ART 54.0 (L) 06/07/2019 0425   HCO3 50.0 (H) 06/07/2019 0425   TCO2 >50 (H) 06/07/2019 0425   ACIDBASEDEF 1.0 05/31/2019 0200   O2SAT 86.0 06/07/2019 0425     Coagulation Profile: No results for input(s): INR, PROTIME in the last 168 hours.  Cardiac Enzymes: Recent Labs  Lab 06/01/19 0950  TROPONINI 0.06*    HbA1C: Hemoglobin A1C  Date/Time Value Ref Range Status  06/19/2018 04:01 PM 9.1 (A) 4.0 - 5.6 % Final  01/02/2017 06:51 PM 12.8  Final   Hgb A1c MFr Bld  Date/Time Value Ref  Range Status  05/23/2019 02:20 AM 9.9 (H) 4.8 - 5.6 % Final    Comment:    (NOTE) Pre diabetes:          5.7%-6.4% Diabetes:              >6.4% Glycemic control for   <7.0% adults with diabetes     CBG: Recent Labs  Lab 06/07/19 1510 06/07/19 1948 06/07/19 2314 06/08/19 0354 06/08/19 0743  GLUCAP 198* 209* 165* 186* 214*       Critical care time: 32 minutes     Heber CarolinaBrent Mercer Stallworth, MD [place] [facility] Pager: [phone] Cell: [phone] If no response, call [phone]

## 2019-06-08 NOTE — Progress Notes (Signed)
PROGRESS NOTE  Lance Mason  ZOX:[id number] DOB: 1956-11-03 DOA: 2019-06-05 PCP: Patient, No Pcp Per   Brief Narrative: Lance Mason is a 63 y.o. male with a history of T2DM, HTN, obesity who presented with cough and shortness of breath and fever found to be hypoxic requiring NRB. CXR demonstrated bilateral opacities and he was admitted to [facility] for covid-19 pneumonia, ultimately intubated.  Assessment & Plan: Principal Problem:   Acute respiratory failure with hypoxemia (HCC) Active Problems:   Type 2 diabetes mellitus without complication, without long-term current use of insulin (HCC)   Pure hypercholesterolemia   Pneumonia due to COVID-19 virus   CAP (community acquired pneumonia)   Abnormal liver function   Pressure injury of skin   Goals of care, counseling/discussion   Palliative care by specialist  Acute hypoxic respiratory failure due to covid-19 pneumonia: Reintubated 6/10 - Continue vent support per PCCM.  Still requiring high vent pressures. - Check daily labs: CBC w/diff, CMP, d-dimer, fibrinogen, ferritin, LDH, CRP - Avoid NSAIDs - s/p actemra 5/27, convalescent plasma 5/29, remdesivir 5/27 - 5/31. - s/p abx x7 days.   Cardiac arrest 6/11-6/12: With suspected perfusion limitation causing bilateral lower extremity arterial insufficiency/ischemia:  - CT head due to possible anoxic brain injury was nonacute.   Lower extremity ischemia: Anticipate this will develop into necrosis and ultimately require bilateral amputations.  - Poor prognosis. Discussed with wife that this also bodes poorly for cerebral recovery.  Aspiration/HCAP:  -He has been treated with an adequate course of cefepime/Flagyl and Zosyn.  No fevers.  Antibiotics have since been discontinued - Need to continue tube feeds, elevate HOB  Oral candidiasis: - Has been given fluconazole x7 days.  T2DM: HbA1c 9.9%.  - Hold home metformin, glipizide - Continue covering with SSI -Blood  sugars have been fairly stable  AFib with RVR:  -Patient was started on amiodarone infusion and converted to sinus rhythm -Amiodarone has since been discontinued and he is currently on beta-blockers.  Remains in sinus rhythm. - Keep K and Mg wnl -He is anticoagulated with Lovenox  Obesity: BMI: 38. Contributes to poor prognosis - Optimize nutritional status per dietitian  Hypernatremia: Resolved.  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  - WOC was consulted and recommended enzymatic debridement to the central wound area as well as air mattress.    DVT prophylaxis: Therapeutic Lovenox Code Status: DNR Family Communication: PCCM discussed goals of care with patient's family as did palliative medicine service.  Initially, family agreed to withdraw care and focus on comfort.  The following morning, family wishes to continue current aggressive care with the hopes that he will recover.  They no longer wish to speak to palliative medicine. Disposition Plan: Remain in ICU  Consultants:   PCCM  Procedures:   5/29 ETT  5/29 - 6/10 L subclavian central line  6/10 PICC  Antimicrobials:  Azithromycin 5/26 >> 6/2  Cefepime 5/27 >> 6/2 and 6/7 >>d/c  Fluconazole 6/2 >> 6/9  Remdesivir 5/27 >> 5/31  Vancomycin 5/26 >> 5/28  And 6/7 >> 6/11  Metronidazole 6/11   Zosyn 6/12 >>6/17  Subjective: Intubated and sedated  Objective: Vitals:   06/08/19 1200 06/08/19 1300 06/08/19 1400 06/08/19 1505  BP: 116/61 127/74 115/67 122/71  Pulse: 93 (!) 104 (!) 106 (!) 113  Resp: (!) 29   (!) 40  Temp: 100 F (37.8 C)     TempSrc:  Oral     SpO2: (!) 88% (!) 85% (!) 85% (!) 85%  Weight:      Height:        Intake/Output Summary (Last 24 hours) at 06/08/2019 1653 Last data filed at 06/08/2019 1400 Gross per 24 hour  Intake 2301.37 ml  Output 3311 ml  Net -1009.63 ml   General exam: Sedated on ventilator Respiratory  system: Scattered rhonchi respiratory effort normal. Cardiovascular system:RRR. No murmurs, rubs, gallops. Gastrointestinal system: Abdomen is nondistended, soft and nontender. No organomegaly or masses felt. Normal bowel sounds heard. Central nervous system: Unable to assess Extremities: No C/C/E, +pedal pulses Skin: Purple discoloration of the toes bilaterally, cool to touch Psychiatry: Unable to assess   Data Reviewed: I have personally reviewed following labs and imaging studies  CBC: Recent Labs  Lab 06/04/19 0130  06/05/19 0215  06/05/19 1147 06/06/19 0400 06/07/19 0425 06/07/19 0450 06/08/19 0110  WBC 10.0  --  8.0  --   --  7.2  --  7.8 8.0  HGB 11.4*   < > 10.3*   < > 10.9* 10.2* 12.9* 9.5* 9.9*  HCT 37.2*   < > 33.5*   < > 32.0* 35.0* 38.0* 32.6* 33.2*  MCV 104.5*  --  104.7*  --   --  108.4*  --  108.7* 107.8*  PLT 142*  --  128*  --   --  147*  --  146* 173   < > = values in this interval not displayed.   Basic Metabolic Panel: Recent Labs  Lab 06/02/19 0403  06/05/19 0215  06/05/19 0515  06/05/19 2020 06/06/19 0400 06/07/19 0425 06/07/19 0450 06/08/19 0110  NA 148*   < > 149*   < >  --    < > 148* 148* 144 144 143  K 3.7   < > 2.9*   < >  --    < > 4.6 3.8 3.5 3.6 3.4*  CL 108   < > 99  --   --   --  97* 95*  --  91* 89*  CO2 34*   < > 38*  --   --   --  41* 43*  --  44* 44*  GLUCOSE 124*   < > 212*  --   --   --  245* 155*  --  156* 239*  BUN 37*   < > 40*  --   --   --  49* 44*  --  46* 42*  CREATININE 0.98   < > 0.80  --   --   --  0.87 0.77  --  0.75 0.72  CALCIUM 7.1*   < > 7.4*  --   --   --  8.1* 8.1*  --  8.0* 8.1*  MG 2.2  --   --   --  1.9  --   --   --   --   --   --   PHOS 3.4  --   --   --   --   --   --   --   --   --   --    < > = values in this interval not displayed.   GFR: Estimated Creatinine Clearance: 104.8 mL/min (by C-G formula based on SCr of 0.72 mg/dL). Liver Function Tests: Recent Labs  Lab 06/03/19 0450 06/04/19 0130  06/05/19 0215 06/07/19 0450 06/08/19 0110  AST 95* 74* 57* 61* 53*  ALT 121* 92*  61* 64* 54*  ALKPHOS 539* 530* 438* 482* 392*  BILITOT 0.1* 0.2* 0.4 0.2* 0.2*  PROT 6.2* 6.6 5.9* 6.4* 7.2  ALBUMIN 2.1* 2.2* 1.8* 1.8* 2.0*   No results for input(s): LIPASE, AMYLASE in the last 168 hours. No results for input(s): AMMONIA in the last 168 hours. Coagulation Profile: No results for input(s): INR, PROTIME in the last 168 hours. Cardiac Enzymes: No results for input(s): CKTOTAL, CKMB, CKMBINDEX, TROPONINI in the last 168 hours. BNP (last 3 results) No results for input(s): PROBNP in the last 8760 hours. HbA1C: No results for input(s): HGBA1C in the last 72 hours. CBG: Recent Labs  Lab 06/07/19 2314 06/08/19 0354 06/08/19 0743 06/08/19 1245 06/08/19 1625  GLUCAP 165* 186* 214* 185* 177*   Lipid Profile: No results for input(s): CHOL, HDL, LDLCALC, TRIG, CHOLHDL, LDLDIRECT in the last 72 hours. Thyroid Function Tests: No results for input(s): TSH, T4TOTAL, FREET4, T3FREE, THYROIDAB in the last 72 hours. Anemia Panel: No results for input(s): VITAMINB12, FOLATE, FERRITIN, TIBC, IRON, RETICCTPCT in the last 72 hours. Urine analysis:    Component Value Date/Time   COLORURINE YELLOW 05/28/2016 2056   APPEARANCEUR CLEAR 05/28/2016 2056   LABSPEC 1.041 (H) 05/28/2016 2056   PHURINE 5.0 05/28/2016 2056   GLUCOSEU >1000 (A) 05/28/2016 2056   HGBUR NEGATIVE 05/28/2016 2056   BILIRUBINUR negative 06/19/2018 1601   KETONESUR negative 06/19/2018 1601   KETONESUR 15 (A) 05/28/2016 2056   PROTEINUR negative 06/19/2018 1601   PROTEINUR NEGATIVE 05/28/2016 2056   UROBILINOGEN 0.2 06/19/2018 1601   NITRITE Negative 06/19/2018 1601   NITRITE NEGATIVE 05/28/2016 2056   LEUKOCYTESUR Negative 06/19/2018 1601   Recent Results (from the past 240 hour(s))  Culture, blood (Routine X 2) w Reflex to ID Panel     Status: None   Collection Time: 06/02/19  5:45 AM   Specimen: BLOOD  Result  Value Ref Range Status   Specimen Description   Final    BLOOD LEFT ARM Performed at [facility], [address] [place], [place] [address]    Special Requests   Final    BOTTLES DRAWN AEROBIC ONLY Blood Culture adequate volume Performed at [facility], [address] [place], [place] [address]    Culture   Final    NO GROWTH 5 DAYS Performed at [facility], [address] [place], [place] [address]    Report Status 06/07/2019 FINAL  Final  Culture, blood (Routine X 2) w Reflex to ID Panel     Status: None   Collection Time: 06/02/19  5:50 AM   Specimen: BLOOD  Result Value Ref Range Status   Specimen Description   Final    BLOOD LEFT HAND Performed at [facility], [address] [place], [place] [address]    Special Requests   Final    BOTTLES DRAWN AEROBIC ONLY Blood Culture adequate volume Performed at [facility], [address] [place], [place] [address]    Culture   Final    NO GROWTH 5 DAYS Performed at [facility], [address] [place], [place] [address]    Report Status 06/07/2019 FINAL  Final  Culture, respiratory (non-expectorated)     Status: None   Collection Time: 06/02/19 10:07 AM   Specimen: Tracheal Aspirate; Respiratory  Result Value Ref Range Status   Specimen Description   Final    TRACHEAL ASPIRATE Performed at [facility], [address] [place], [place] [address]    Special Requests   Final    NONE Performed at [facility], [address] [place], [place] [address]    Gram Stain   Final    RARE WBC PRESENT, PREDOMINANTLY PMN NO ORGANISMS SEEN    Culture   Final    NO GROWTH 2 DAYS Performed at [facility], [address] [place], [place] [address]    Report Status 06/04/2019 FINAL  Final      Radiology Studies: Dg Chest Port 1 View  Result Date: 06/08/2019 CLINICAL DATA:  Acute  respiratory failure with hypoxia. EXAM: PORTABLE CHEST 1 VIEW COMPARISON:  05/31/2019 and CT 05/27/2019 FINDINGS: Endotracheal tube has tip 4.7 cm above the carina. Enteric tube courses into the region of the stomach and off the film as tip is not visualized. Right-sided PICC line unchanged with tip over the SVC. Lungs are adequately inflated demonstrate persistent bilateral hazy airspace opacification more prominent over the central mid to lower lungs likely multifocal infection. Possible small amount of bilateral pleural fluid unchanged. Remainder of the exam is unchanged. IMPRESSION: Stable bilateral hazy airspace process worse over the mid to lower lungs. Findings likely due to multifocal infection. Small amount of bilateral pleural fluid unchanged. Tubes and lines as described. Electronically Signed   By: Elberta Fortisaniel  Boyle M.D.   On: 06/08/2019 16:20    Scheduled Meds: . chlorhexidine  15 mL Mouth/Throat BID  . Chlorhexidine Gluconate Cloth  6 each Topical Daily  . collagenase   Topical Daily  . enoxaparin (LOVENOX) injection  100 mg Subcutaneous Q12H  . famotidine  20 mg Per Tube BID  . feeding supplement (PRO-STAT SUGAR FREE 64)  60 mL Per Tube BID  . feeding supplement (VITAL AF 1.2 CAL)  1,000 mL Per Tube Q24H  . free water  300 mL Per Tube Q6H  . furosemide  40 mg Intravenous Q12H  . guaiFENesin-dextromethorphan  15 mL Per Tube Q6H  . insulin aspart  0-20 Units Subcutaneous Q4H  . insulin glargine  5 Units Subcutaneous QHS  . mouth rinse  15 mL Mouth Rinse 10 times per day  . metoprolol tartrate  25 mg Per Tube Q6H  . multivitamin  15 mL Per Tube Daily  . nutrition supplement (JUVEN)  1 packet Per Tube BID BM  . sodium chloride flush  10-40 mL Intracatheter Q12H   Continuous Infusions: . sodium chloride Stopped (06/07/19 1144)  . HYDROmorphone 5 mg/hr (06/08/19 1542)  . midazolam 5 mg/hr (06/08/19 1400)     LOS: 25 days   Critical care Time spent: 35 minutes.  Erick BlinksJehanzeb Sholanda Croson, MD  [facility] [email or web address] Password [email or web address] 06/08/2019, 4:53 PM

## 2019-06-08 NOTE — Progress Notes (Signed)
Family Update: Spoke with patients wife and family on the phone who translated. No further questions at this time.

## 2019-06-08 NOTE — Progress Notes (Signed)
ABG done on pt with low spo2. Critical ABG results called to Dr [place] Bells and Dr Ulice Bold but it did not cross over to epic 7.40/87/50/54 sao2 80%. Pt given 1 dose of paralytic, no other changes at this time. MD to come see pt later.

## 2019-06-08 NOTE — Progress Notes (Signed)
Facetime call with niece, wife and other family members. Informed them that his condition has become more critical the last few hours as his oxygen demands have increased and required paralytics as well as increased sedation based on ABG/CXR. They understand his condition is very tenuous at this time and wife states "it's in [place]." Will continue to monitor closely.

## 2019-06-09 DIAGNOSIS — Z515 Encounter for palliative care: Secondary | ICD-10-CM

## 2019-06-09 LAB — CBC
HCT: 31.4 % — ABNORMAL LOW (ref 39.0–52.0)
Hemoglobin: 9.2 g/dL — ABNORMAL LOW (ref 13.0–17.0)
MCH: 31.7 pg (ref 26.0–34.0)
MCHC: 29.3 g/dL — ABNORMAL LOW (ref 30.0–36.0)
MCV: 108.3 fL — ABNORMAL HIGH (ref 80.0–100.0)
Platelets: 184 10*3/uL (ref 150–400)
RBC: 2.9 MIL/uL — ABNORMAL LOW (ref 4.22–5.81)
RDW: 18 % — ABNORMAL HIGH (ref 11.5–15.5)
WBC: 5.9 10*3/uL (ref 4.0–10.5)
nRBC: 1.2 % — ABNORMAL HIGH (ref 0.0–0.2)

## 2019-06-09 LAB — COMPREHENSIVE METABOLIC PANEL WITH GFR
ALT: 43 U/L (ref 0–44)
AST: 47 U/L — ABNORMAL HIGH (ref 15–41)
Albumin: 1.8 g/dL — ABNORMAL LOW (ref 3.5–5.0)
Alkaline Phosphatase: 318 U/L — ABNORMAL HIGH (ref 38–126)
Anion gap: 9 (ref 5–15)
BUN: 39 mg/dL — ABNORMAL HIGH (ref 8–23)
CO2: 48 mmol/L — ABNORMAL HIGH (ref 22–32)
Calcium: 8.1 mg/dL — ABNORMAL LOW (ref 8.9–10.3)
Chloride: 87 mmol/L — ABNORMAL LOW (ref 98–111)
Creatinine, Ser: 0.78 mg/dL (ref 0.61–1.24)
GFR calc Af Amer: >60 mL/min
GFR calc non Af Amer: >60 mL/min
Glucose, Bld: 176 mg/dL — ABNORMAL HIGH (ref 70–99)
Potassium: 3.3 mmol/L — ABNORMAL LOW (ref 3.5–5.1)
Sodium: 144 mmol/L (ref 135–145)
Total Bilirubin: 0.2 mg/dL — ABNORMAL LOW (ref 0.3–1.2)
Total Protein: 6.9 g/dL (ref 6.5–8.1)

## 2019-06-09 LAB — GLUCOSE, CAPILLARY
Glucose-Capillary: 151 mg/dL — ABNORMAL HIGH (ref 70–99)
Glucose-Capillary: 182 mg/dL — ABNORMAL HIGH (ref 70–99)
Glucose-Capillary: 187 mg/dL — ABNORMAL HIGH (ref 70–99)
Glucose-Capillary: 144 mg/dL — ABNORMAL HIGH (ref 70–99)
Glucose-Capillary: 167 mg/dL — ABNORMAL HIGH (ref 70–99)

## 2019-06-09 MED ORDER — ARTIFICIAL TEARS OPHTHALMIC OINT
TOPICAL_OINTMENT | Freq: Three times a day (TID) | OPHTHALMIC | Status: DC
  Administered 2019-06-09 (×2): via OPHTHALMIC
  Filled 2019-06-09: qty 3.5

## 2019-06-09 MED ORDER — MIDAZOLAM BOLUS VIA INFUSION
2.0000 mg | INTRAVENOUS | Status: DC | PRN
  Administered 2019-06-09: 2 mg via INTRAVENOUS
  Filled 2019-06-09: qty 2

## 2019-06-09 MED ORDER — POTASSIUM CHLORIDE 20 MEQ PO PACK
40.0000 meq | PACK | Freq: Two times a day (BID) | ORAL | Status: DC
  Administered 2019-06-09: 22:00:00 40 meq
  Filled 2019-06-09 (×2): qty 2

## 2019-06-09 MED ORDER — HYDROMORPHONE BOLUS VIA INFUSION
1.0000 mg | INTRAVENOUS | Status: DC | PRN
  Filled 2019-06-09: qty 3

## 2019-06-09 NOTE — Progress Notes (Signed)
Spoke with patients niece who translated for wife on the phone and gave an update.

## 2019-06-09 NOTE — Progress Notes (Signed)
PROGRESS NOTE  Ollin Hochmuth [place]  ALP:[id number] DOB: 05/15/1956 DOA: 04/25/2019 PCP: Patient, No Pcp Per   Brief Narrative: Martavis Gurney is a 63 y.o. male with a history of T2DM, HTN, obesity who presented with cough and shortness of breath and fever found to be hypoxic requiring NRB. CXR demonstrated bilateral opacities and he was admitted to [facility] for covid-19 pneumonia, ultimately intubated.  Assessment & Plan: Principal Problem:   Acute respiratory failure with hypoxemia (HCC) Active Problems:   Type 2 diabetes mellitus without complication, without long-term current use of insulin (HCC)   Pure hypercholesterolemia   Pneumonia due to COVID-19 virus   CAP (community acquired pneumonia)   Abnormal liver function   Pressure injury of skin   Goals of care, counseling/discussion   Palliative care by specialist  Acute hypoxic respiratory failure due to covid-19 pneumonia: Reintubated 6/10 - Continue vent support per PCCM.  Still requiring high vent pressures. - Check daily labs: CBC w/diff, CMP, d-dimer, fibrinogen, ferritin, LDH, CRP - Avoid NSAIDs - s/p actemra 5/27, convalescent plasma 5/29, remdesivir 5/27 - 5/31. - s/p abx x7 days.   Cardiac arrest 6/11-6/12: With suspected perfusion limitation causing bilateral lower extremity arterial insufficiency/ischemia:  - CT head due to possible anoxic brain injury was nonacute.   Lower extremity ischemia: Anticipate this will develop into necrosis and ultimately require bilateral amputations.  - Poor prognosis. Discussed with wife that this also bodes poorly for cerebral recovery.  Aspiration/HCAP:  -He has been treated with an adequate course of cefepime/Flagyl and Zosyn.  No fevers.  Antibiotics have since been discontinued - Need to continue tube feeds, elevate HOB  Oral candidiasis: - Has been given fluconazole x7 days.  T2DM: HbA1c 9.9%.  - Hold home metformin, glipizide - Continue covering with SSI -Blood  sugars have been fairly stable  AFib with RVR:  -Patient was started on amiodarone infusion and converted to sinus rhythm -Amiodarone has since been discontinued and he is currently on beta-blockers.  Remains in sinus rhythm. - Keep K and Mg wnl -He is anticoagulated with Lovenox  Obesity: BMI: 38. Contributes to poor prognosis - Optimize nutritional status per dietitian  Hypernatremia: Resolved.  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  - WOC was consulted and recommended enzymatic debridement to the central wound area as well as air mattress.    DVT prophylaxis: Therapeutic Lovenox Code Status: DNR Family Communication: PCCM discussed goals of care with patient's family as did palliative medicine service.  Initially, family agreed to withdraw care and focus on comfort.  The following morning, family wished to continue current aggressive care with the hopes that he will recover.  They no longer wish to speak to palliative medicine.  I have reiterated he is poor prognosis and will be appropriate to transition to comfort measures.  They wish to continue current care and were thankful for the care that is been provided thus far.  Disposition Plan: Remain in ICU  Consultants:   PCCM  Procedures:   5/29 ETT  5/29 - 6/10 L subclavian central line  6/10 PICC  Antimicrobials:  Azithromycin 5/26 >> 6/2  Cefepime 5/27 >> 6/2 and 6/7 >>d/c  Fluconazole 6/2 >> 6/9  Remdesivir 5/27 >> 5/31  Vancomycin 5/26 >> 5/28  And 6/7 >> 6/11  Metronidazole 6/11   Zosyn 6/12 >>6/17  Subjective: Intubated and sedated  Objective: Vitals:   06/09/19 1123 06/09/19 1200 06/09/19  1300 06/09/19 1518  BP: (!) 142/78 116/65 (!) 141/78 132/70  Pulse: (!) 120 (!) 116 (!) 114 (!) 120  Resp: (!) 38   (!) 37  Temp:  99 F (37.2 C)    TempSrc:      SpO2: 91% 93% 92% (!) 87%  Weight:      Height:        Intake/Output Summary (Last  24 hours) at 06/09/2019 1642 Last data filed at 06/09/2019 1400 Gross per 24 hour  Intake 2034.15 ml  Output 2890 ml  Net -855.85 ml   General exam: Intubated and sedated Respiratory system: Clear to auscultation.  Increased respiratory effort. Cardiovascular system: Tachycardic, regular. No murmurs, rubs, gallops. Gastrointestinal system: Abdomen is nondistended, soft and nontender. No organomegaly or masses felt. Normal bowel sounds heard. Central nervous system: Unable to assess. Extremities: 1+ edema bilaterally Skin: No rashes, lesions or ulcers Psychiatry: Unable to assess    Data Reviewed: I have personally reviewed following labs and imaging studies  CBC: Recent Labs  Lab 06/05/19 0215  06/06/19 0400 06/07/19 0425 06/07/19 0450 06/08/19 0110 06/09/19 0132  WBC 8.0  --  7.2  --  7.8 8.0 5.9  HGB 10.3*   < > 10.2* 12.9* 9.5* 9.9* 9.2*  HCT 33.5*   < > 35.0* 38.0* 32.6* 33.2* 31.4*  MCV 104.7*  --  108.4*  --  108.7* 107.8* 108.3*  PLT 128*  --  147*  --  146* 173 184   < > = values in this interval not displayed.   Basic Metabolic Panel: Recent Labs  Lab 06/05/19 0515  06/05/19 2020 06/06/19 0400 06/07/19 0425 06/07/19 0450 06/08/19 0110 06/09/19 0132  NA  --    < > 148* 148* 144 144 143 144  K  --    < > 4.6 3.8 3.5 3.6 3.4* 3.3*  CL  --   --  97* 95*  --  91* 89* 87*  CO2  --   --  41* 43*  --  44* 44* 48*  GLUCOSE  --   --  245* 155*  --  156* 239* 176*  BUN  --   --  49* 44*  --  46* 42* 39*  CREATININE  --   --  0.87 0.77  --  0.75 0.72 0.78  CALCIUM  --   --  8.1* 8.1*  --  8.0* 8.1* 8.1*  MG 1.9  --   --   --   --   --   --   --    < > = values in this interval not displayed.   GFR: Estimated Creatinine Clearance: 104.4 mL/min (by C-G formula based on SCr of 0.78 mg/dL). Liver Function Tests: Recent Labs  Lab 06/04/19 0130 06/05/19 0215 06/07/19 0450 06/08/19 0110 06/09/19 0132  AST 74* 57* 61* 53* 47*  ALT 92* 61* 64* 54* 43  ALKPHOS  530* 438* 482* 392* 318*  BILITOT 0.2* 0.4 0.2* 0.2* 0.2*  PROT 6.6 5.9* 6.4* 7.2 6.9  ALBUMIN 2.2* 1.8* 1.8* 2.0* 1.8*   No results for input(s): LIPASE, AMYLASE in the last 168 hours. No results for input(s): AMMONIA in the last 168 hours. Coagulation Profile: No results for input(s): INR, PROTIME in the last 168 hours. Cardiac Enzymes: No results for input(s): CKTOTAL, CKMB, CKMBINDEX, TROPONINI in the last 168 hours. BNP (last 3 results) No results for input(s): PROBNP in the last 8760 hours. HbA1C: No results for input(s): HGBA1C in the  last 72 hours. CBG: Recent Labs  Lab 06/08/19 2341 06/09/19 0319 06/09/19 0726 06/09/19 1141 06/09/19 1627  GLUCAP 157* 151* 182* 187* 144*   Lipid Profile: No results for input(s): CHOL, HDL, LDLCALC, TRIG, CHOLHDL, LDLDIRECT in the last 72 hours. Thyroid Function Tests: No results for input(s): TSH, T4TOTAL, FREET4, T3FREE, THYROIDAB in the last 72 hours. Anemia Panel: No results for input(s): VITAMINB12, FOLATE, FERRITIN, TIBC, IRON, RETICCTPCT in the last 72 hours. Urine analysis:    Component Value Date/Time   COLORURINE YELLOW 05/28/2016 2056   APPEARANCEUR CLEAR 05/28/2016 2056   LABSPEC 1.041 (H) 05/28/2016 2056   PHURINE 5.0 05/28/2016 2056   GLUCOSEU >1000 (A) 05/28/2016 2056   HGBUR NEGATIVE 05/28/2016 2056   BILIRUBINUR negative 06/19/2018 1601   KETONESUR negative 06/19/2018 1601   KETONESUR 15 (A) 05/28/2016 2056   PROTEINUR negative 06/19/2018 1601   PROTEINUR NEGATIVE 05/28/2016 2056   UROBILINOGEN 0.2 06/19/2018 1601   NITRITE Negative 06/19/2018 1601   NITRITE NEGATIVE 05/28/2016 2056   LEUKOCYTESUR Negative 06/19/2018 1601   Recent Results (from the past 240 hour(s))  Culture, blood (Routine X 2) w Reflex to ID Panel     Status: None   Collection Time: 06/02/19  5:45 AM   Specimen: BLOOD  Result Value Ref Range Status   Specimen Description   Final    BLOOD LEFT ARM Performed at [facility], [address] [place], [place] [address]    Special Requests   Final    BOTTLES DRAWN AEROBIC ONLY Blood Culture adequate volume Performed at [facility], [address] [place], [place] [address]    Culture   Final    NO GROWTH 5 DAYS Performed at [facility], [address] [place], [place] [address]    Report Status 06/07/2019 FINAL  Final  Culture, blood (Routine X 2) w Reflex to ID Panel     Status: None   Collection Time: 06/02/19  5:50 AM   Specimen: BLOOD  Result Value Ref Range Status   Specimen Description   Final    BLOOD LEFT HAND Performed at [facility], [address] [place], [place] [address]    Special Requests   Final    BOTTLES DRAWN AEROBIC ONLY Blood Culture adequate volume Performed at [facility], [address] [place], [place] [address]    Culture   Final    NO GROWTH 5 DAYS Performed at [facility], [address] [place], [place] [address]    Report Status 06/07/2019 FINAL  Final  Culture, respiratory (non-expectorated)     Status: None   Collection Time: 06/02/19 10:07 AM   Specimen: Tracheal Aspirate; Respiratory  Result Value Ref Range Status   Specimen Description   Final    TRACHEAL ASPIRATE Performed at [facility], [address] [place], [place] [address]    Special Requests   Final    NONE Performed at [facility], [address] [place], [place] [address]    Gram Stain   Final    RARE WBC PRESENT, PREDOMINANTLY PMN NO ORGANISMS SEEN    Culture   Final    NO GROWTH 2 DAYS Performed at [facility], [address] [place], [place] [address]    Report Status 06/04/2019 FINAL  Final      Radiology Studies: Dg Chest Port 1 View  Result Date: 06/08/2019 CLINICAL DATA:  Hypoxia and respiratory failure. EXAM: PORTABLE CHEST 1 VIEW COMPARISON:  06/08/2019 FINDINGS: Cardiomediastinal silhouette is  obscured but appears unchanged. An endotracheal tube with tip 5 cm above the carina, NG tube entering the stomach with tip off field of view, and RIGHT PICC line with tip overlying the SUPERIOR cavoatrial junction again noted. Diffuse bilateral airspace opacities are unchanged. No evidence of pneumothorax. IMPRESSION: Unchanged appearance of the chest with diffuse bilateral airspace disease/edema. Electronically Signed   By: Harmon PierJeffrey  Hu M.D.   On: 06/08/2019 17:18   Dg Chest Port 1 View  Result Date: 06/08/2019 CLINICAL DATA:  Acute respiratory failure with hypoxia. EXAM: PORTABLE CHEST 1 VIEW COMPARISON:  05/31/2019 and CT 05/27/2019 FINDINGS: Endotracheal tube has tip 4.7 cm above the carina. Enteric tube courses into the region of the stomach and off the film as tip is not visualized. Right-sided PICC line unchanged with tip over the SVC. Lungs are adequately inflated demonstrate persistent bilateral hazy airspace opacification more prominent over the central mid to lower lungs likely multifocal infection. Possible small amount of bilateral pleural fluid unchanged. Remainder of the exam is unchanged. IMPRESSION: Stable bilateral hazy airspace process worse over the mid to lower lungs. Findings likely due to multifocal infection. Small amount of bilateral pleural fluid unchanged. Tubes and lines as described. Electronically Signed   By: Elberta Fortisaniel  Boyle M.D.   On: 06/08/2019 16:20    Scheduled Meds: . artificial tears   Both Eyes Q8H  . chlorhexidine  15 mL Mouth/Throat BID  . Chlorhexidine Gluconate Cloth  6 each Topical Daily  . enoxaparin (LOVENOX) injection  100 mg Subcutaneous Q12H  . famotidine  20 mg Per Tube BID  . feeding supplement (PRO-STAT SUGAR FREE 64)  60 mL Per Tube BID  . feeding supplement (VITAL AF 1.2 CAL)  1,000 mL Per Tube Q24H  . free water  300 mL Per Tube Q6H  . furosemide  40 mg Intravenous Q12H  . guaiFENesin-dextromethorphan  15 mL Per Tube Q6H  . insulin aspart  0-20  Units Subcutaneous Q4H  . insulin glargine  15 Units Subcutaneous QHS  . mouth rinse  15 mL Mouth Rinse 10 times per day  . metoprolol tartrate  25 mg Per Tube Q6H  . multivitamin  15 mL Per Tube Daily  . nutrition supplement (JUVEN)  1 packet Per Tube BID BM  . sodium chloride flush  10-40 mL Intracatheter Q12H   Continuous Infusions: . sodium chloride Stopped (06/07/19 1144)  . HYDROmorphone 7 mg/hr (06/09/19 1528)  . midazolam 7 mg/hr (06/09/19 1400)     LOS: 26 days   Critical care Time spent: 35 minutes.  Erick BlinksJehanzeb Barbette Mcglaun, MD [facility] [email or web address] Password [email or web address] 06/09/2019, 4:42 PM

## 2019-06-09 NOTE — Progress Notes (Signed)
NAME:  Lance Mason, MRN:  [id number], DOB:  [birth year], LOS: 34 ADMISSION DATE:  05-24-2019, CONSULTATION DATE:  May 15, 2019 REFERRING MD:  Roderic Palau, CHIEF COMPLAINT:  Dyspnea   Brief History   63 year old male with type 2 diabetes, hypertension admitted on May 26 with acute respiratory failure with hypoxemia due to COVID-19 pneumonia.   Past Medical History  Hypertension Hyperlipidemia DM 2  Significant Hospital Events   May 26 admission May 29 intubation, mechanical ventilation in setting of profound encephalopathy May 31 worsening dyssynchrony, hypoxemia, added vecuronium June 1 chemical paralysis used overnight  June 2-5 weaning FiO2/PEEP June 6 weaning well on pressure support June 7 worsening oxygenation/tachycardia fever, started on antibiotics again June 9 following commands, weaning, extubated June 10 vomited, hypoxemic, re-intubated June 12 cardiac arrest. Stopped proning due to isntability June 14 Atrial fibrillation, worsening ventialtor settings June 15 made DNR June 19 palliative medicine consult> family wants to withdraw care June 20 family wants to continue supportive care, doesn't want to withdraw  Consults:  Pulmonary and critical care medicine  Procedures:  Endotracheal tube May 29> June 9, June 10 >> Left subclavian central venous line May 29> June 10 June 10 PICC>   Significant Diagnostic Tests:  CT angiogram chest May 27 diffuse bilateral patchy airspace disease groundglass upper lobes predominant, no pulmonary embolism June 1 bilateral lower ext doppler > neg DVT June 1 TTE >>>LVEF > 65%, RV normal but poorly visualized CT head 6/7 > NAICP CT chest 6/8 > No PE, findings consistent with viral pneumonia, increased RLL consolidation, reactive subcarinal lymph node  Micro Data:  May 21 SARS-CoV-2 positive May 26 blood cultures June 7 resp culture > GPC, GNR, GPR > OPF June 7 blood culture > Neg  June 14 Blood cx >> neg June 14 Sputum Cx  >> neg  Antimicrobials/COVID treatment:   May 26 azithromycin > June 2 May 27 cefepime > June 2 May 27 vancomycin > May 31 June 2 Fluconazole >  May 27 remdesivir May 26 solumedrol > 6/6 May 27 Tocilizumab x1 5/27 May 29 Convalescent plasma June 7 Vanc > 6/9 June 7 Cefepime > 6/9, restart 6/10 > 6/12 June 10 Flagyl > 6/12  Zosyn 6/12 >> 6/17  Interim history/subjective:  Remains on maximum vent support  Objective   Blood pressure 134/78, pulse (!) 111, temperature 99.3 F (37.4 C), temperature source Oral, resp. rate (!) 35, height 5\' 5"  (1.651 m), weight 103 kg, SpO2 95 %.    Vent Mode: PRVC FiO2 (%):  [90 %-100 %] 90 % Set Rate:  [35 bmp] 35 bmp Vt Set:  [370 mL] 370 mL PEEP:  [20 cmH20] 20 cmH20 Plateau Pressure:  [38 cmH20-47 cmH20] 43 cmH20   Intake/Output Summary (Last 24 hours) at 06/09/2019 0843 Last data filed at 06/09/2019 0800 Gross per 24 hour  Intake 3124.59 ml  Output 3265 ml  Net -140.41 ml   Filed Weights   06/05/19 0500 06/08/19 0500 06/09/19 0348  Weight: 96.4 kg 103.7 kg 103 kg   Examination:  General:  Ill appearing, NAD, sedate on vent HENT: [place]/AT, PERRL, EOM-I and MMM, ETT in place PULM: Diminished bilaterally CV: RRR, Nl S1/S2 and -M/R/G GI: Soft, NT, ND and +BS MSK: -edema and -tenderness Neuro: Sedated, not following commands Derm: toes cyanotic bilaterally  I reviewed CXR myself, pulmonary edema noted and ETT in place  Resolved Hospital Problem list     Assessment & Plan:  ARDS due to COVID pneumonia: no  significant progress Continue ARDS ventilator settings, targeting tidal volume 6 to 8 cc/kg ideal body weight, keeping plateau pressure less than 30 cmH2O No further proning Need to discuss withdrawal and comfort with family, little else that can be done at this point Continue pressure control for ventilator synchrony, monitor tidal volume closely Wean PEEP and FiO2 to maintain SaO2 greater than 88%  Aspiration/healthcare  associated pneumonia: Monitor off of antibiotics  Atrial fibrillation: Telemetry monitoring Hold amiodarone  Need for sedation/ventilator synchrony ICU Delirium Continue quetiapine Monitor QTC RA SS target -2 Dilaudid and versed drips for sedation  Bilateral ischemic/gangrenous toes: Continue supportive care Continue to discuss goals of care with family who have indicated that they would not want amputation  Overall prognosis:  Will not survive current condition, will call family  CODE STATUS: remains DNR, he would not survive cardiac arrest  Best practice:  Diet: tube feeding Pain/Anxiety/Delirium protocol (if indicated): dilaudid, versed infusion, RASS goal -2 VAP protocol (if indicated): yes DVT prophylaxis: heparin infusion GI prophylaxis: famotidine Glucose control: SSI, Lantus  Mobility: passive range of motion in bed Code Status: Full Family Communication: will reach out to family again toady Disposition: remain in ICU  Labs   CBC: Recent Labs  Lab 06/05/19 0215  06/06/19 0400 06/07/19 0425 06/07/19 0450 06/08/19 0110 06/09/19 0132  WBC 8.0  --  7.2  --  7.8 8.0 5.9  HGB 10.3*   < > 10.2* 12.9* 9.5* 9.9* 9.2*  HCT 33.5*   < > 35.0* 38.0* 32.6* 33.2* 31.4*  MCV 104.7*  --  108.4*  --  108.7* 107.8* 108.3*  PLT 128*  --  147*  --  146* 173 184   < > = values in this interval not displayed.    Basic Metabolic Panel: Recent Labs  Lab 06/05/19 0515  06/05/19 2020 06/06/19 0400 06/07/19 0425 06/07/19 0450 06/08/19 0110 06/09/19 0132  NA  --    < > 148* 148* 144 144 143 144  K  --    < > 4.6 3.8 3.5 3.6 3.4* 3.3*  CL  --   --  97* 95*  --  91* 89* 87*  CO2  --   --  41* 43*  --  44* 44* 48*  GLUCOSE  --   --  245* 155*  --  156* 239* 176*  BUN  --   --  49* 44*  --  46* 42* 39*  CREATININE  --   --  0.87 0.77  --  0.75 0.72 0.78  CALCIUM  --   --  8.1* 8.1*  --  8.0* 8.1* 8.1*  MG 1.9  --   --   --   --   --   --   --    < > = values in this  interval not displayed.   GFR: Estimated Creatinine Clearance: 104.4 mL/min (by C-G formula based on SCr of 0.78 mg/dL). Recent Labs  Lab 06/06/19 0400 06/07/19 0450 06/08/19 0110 06/09/19 0132  WBC 7.2 7.8 8.0 5.9    Liver Function Tests: Recent Labs  Lab 06/04/19 0130 06/05/19 0215 06/07/19 0450 06/08/19 0110 06/09/19 0132  AST 74* 57* 61* 53* 47*  ALT 92* 61* 64* 54* 43  ALKPHOS 530* 438* 482* 392* 318*  BILITOT 0.2* 0.4 0.2* 0.2* 0.2*  PROT 6.6 5.9* 6.4* 7.2 6.9  ALBUMIN 2.2* 1.8* 1.8* 2.0* 1.8*   No results for input(s): LIPASE, AMYLASE in the last 168 hours. No results for  input(s): AMMONIA in the last 168 hours.  ABG    Component Value Date/Time   PHART 7.410 06/07/2019 0425   PCO2ART 78.9 (HH) 06/07/2019 0425   PO2ART 54.0 (L) 06/07/2019 0425   HCO3 50.0 (H) 06/07/2019 0425   TCO2 >50 (H) 06/07/2019 0425   ACIDBASEDEF 1.0 05/31/2019 0200   O2SAT 86.0 06/07/2019 0425     Coagulation Profile: No results for input(s): INR, PROTIME in the last 168 hours.  Cardiac Enzymes: No results for input(s): CKTOTAL, CKMB, CKMBINDEX, TROPONINI in the last 168 hours.  HbA1C: Hemoglobin A1C  Date/Time Value Ref Range Status  06/19/2018 04:01 PM 9.1 (A) 4.0 - 5.6 % Final  01/02/2017 06:51 PM 12.8  Final   Hgb A1c MFr Bld  Date/Time Value Ref Range Status  05/23/2019 02:20 AM 9.9 (H) 4.8 - 5.6 % Final    Comment:    (NOTE) Pre diabetes:          5.7%-6.4% Diabetes:              >6.4% Glycemic control for   <7.0% adults with diabetes     CBG: Recent Labs  Lab 06/08/19 1625 06/08/19 2007 06/08/19 2341 06/09/19 0319 06/09/19 0726  GLUCAP 177* 195* 157* 151* 182*   The patient is critically ill with multiple organ systems failure and requires high complexity decision making for assessment and support, frequent evaluation and titration of therapies, application of advanced monitoring technologies and extensive interpretation of multiple databases.    Critical Care Time devoted to patient care services described in this note is  32  Minutes. This time reflects time of care of this signee Dr Koren BoundWesam Yacoub. This critical care time does not reflect procedure time, or teaching time or supervisory time of PA/NP/Med student/Med Resident etc but could involve care discussion time.  Alyson ReedyWesam G. Yacoub, M.D. [facility]. Pager: [phone]. After hours pager: [phone].

## 2019-06-09 NOTE — Progress Notes (Signed)
ABG result at 0300, 06/09/19, VT 370, f-35, peep 20+, 100%  7.32/97/77 with sat of 91%.  ISTAT did not cross over.

## 2019-06-09 NOTE — Progress Notes (Addendum)
[place] Progress Note Patient Name: Maijor Hornig DOB: Dec 21, 1955 MRN: [id number]   Date of Service  06/09/2019  HPI/Events of Note  ABG on 100%/PRVC 35/TV 370/P 20 = 7.3/97/77/  eICU Interventions  Continue present ventilator management.      Intervention Category Major Interventions: Respiratory failure - evaluation and management  Aizley Stenseth Eugene 06/09/2019, 3:20 AM

## 2019-06-10 DIAGNOSIS — I998 Other disorder of circulatory system: Secondary | ICD-10-CM | POA: Diagnosis not present

## 2019-06-10 DIAGNOSIS — I469 Cardiac arrest, cause unspecified: Secondary | ICD-10-CM | POA: Diagnosis not present

## 2019-06-10 DIAGNOSIS — J690 Pneumonitis due to inhalation of food and vomit: Secondary | ICD-10-CM | POA: Diagnosis present

## 2019-06-10 DIAGNOSIS — I4891 Unspecified atrial fibrillation: Secondary | ICD-10-CM | POA: Diagnosis not present

## 2019-06-10 DIAGNOSIS — G9341 Metabolic encephalopathy: Secondary | ICD-10-CM | POA: Diagnosis present

## 2019-06-10 LAB — POCT I-STAT 7, (LYTES, BLD GAS, ICA,H+H)
Acid-Base Excess: 25 mmol/L — ABNORMAL HIGH (ref 0.0–2.0)
Acid-Base Excess: 23 mmol/L — ABNORMAL HIGH (ref 0.0–2.0)
Acid-Base Excess: 23 mmol/L — ABNORMAL HIGH (ref 0.0–2.0)
Acid-Base Excess: 24 mmol/L — ABNORMAL HIGH (ref 0.0–2.0)
Bicarbonate: 54 mmol/L — ABNORMAL HIGH (ref 20.0–28.0)
Bicarbonate: 50.7 mmol/L — ABNORMAL HIGH (ref 20.0–28.0)
Bicarbonate: 52 mmol/L — ABNORMAL HIGH (ref 20.0–28.0)
Bicarbonate: 52.8 mmol/L — ABNORMAL HIGH (ref 20.0–28.0)
Calcium, Ion: 1.15 mmol/L (ref 1.15–1.40)
Calcium, Ion: 1.13 mmol/L — ABNORMAL LOW (ref 1.15–1.40)
Calcium, Ion: 1.13 mmol/L — ABNORMAL LOW (ref 1.15–1.40)
Calcium, Ion: 1.13 mmol/L — ABNORMAL LOW (ref 1.15–1.40)
HCT: 30 % — ABNORMAL LOW (ref 39.0–52.0)
HCT: 29 % — ABNORMAL LOW (ref 39.0–52.0)
HCT: 30 % — ABNORMAL LOW (ref 39.0–52.0)
HCT: 31 % — ABNORMAL LOW (ref 39.0–52.0)
Hemoglobin: 10.2 g/dL — ABNORMAL LOW (ref 13.0–17.0)
Hemoglobin: 10.2 g/dL — ABNORMAL LOW (ref 13.0–17.0)
Hemoglobin: 10.5 g/dL — ABNORMAL LOW (ref 13.0–17.0)
Hemoglobin: 9.9 g/dL — ABNORMAL LOW (ref 13.0–17.0)
O2 Saturation: 80 %
O2 Saturation: 81 %
O2 Saturation: 84 %
O2 Saturation: 91 %
Patient temperature: 99.7
Patient temperature: 100.8
Patient temperature: 99.3
Patient temperature: 99.8
Potassium: 3.6 mmol/L (ref 3.5–5.1)
Potassium: 3.2 mmol/L — ABNORMAL LOW (ref 3.5–5.1)
Potassium: 3.3 mmol/L — ABNORMAL LOW (ref 3.5–5.1)
Potassium: 3.6 mmol/L (ref 3.5–5.1)
Sodium: 140 mmol/L (ref 135–145)
Sodium: 143 mmol/L (ref 135–145)
Sodium: 143 mmol/L (ref 135–145)
Sodium: 143 mmol/L (ref 135–145)
TCO2: >50 mmol/L — ABNORMAL HIGH (ref 22–32)
TCO2: >50 mmol/L — ABNORMAL HIGH (ref 22–32)
TCO2: >50 mmol/L — ABNORMAL HIGH (ref 22–32)
TCO2: >50 mmol/L — ABNORMAL HIGH (ref 22–32)
pCO2 arterial: 87.1 mmHg (ref 32.0–48.0)
pCO2 arterial: 80.3 mmHg (ref 32.0–48.0)
pCO2 arterial: 80.5 mmHg (ref 32.0–48.0)
pCO2 arterial: >97 mmHg (ref 32.0–48.0)
pH, Arterial: 7.403 (ref 7.350–7.450)
pH, Arterial: 7.328 — ABNORMAL LOW (ref 7.350–7.450)
pH, Arterial: 7.41 (ref 7.350–7.450)
pH, Arterial: 7.421 (ref 7.350–7.450)
pO2, Arterial: 50 mmHg — ABNORMAL LOW (ref 83.0–108.0)
pO2, Arterial: 49 mmHg — ABNORMAL LOW (ref 83.0–108.0)
pO2, Arterial: 55 mmHg — ABNORMAL LOW (ref 83.0–108.0)
pO2, Arterial: 77 mmHg — ABNORMAL LOW (ref 83.0–108.0)

## 2019-06-10 MED ORDER — SODIUM CHLORIDE 0.9 % IV BOLUS: 500.0000 mL | Freq: Once | INTRAVENOUS | Status: DC

## 2019-06-19 NOTE — Progress Notes (Signed)
Wasted 15 ml of Versed drip and 95 ml of diluadid drip with Dellis Filbert strenk.

## 2019-06-19 NOTE — Progress Notes (Signed)
Called to pronounce [employer] Dead. Examined. No spontaneous breathing , no pulse, pupils fixed and dilated, asystole in monitor. Declared death at [facility] on 2019/07/05. Family notified.   Death summery to be done by Dr Pearletha Forge MD

## 2019-06-19 NOTE — Discharge Summary (Signed)
Death Summary  Lance Mason WJX:[id number] DOB: 01-03-1956 DOA: 06-02-2019  PCP: Patient, No Pcp Per  Admit date: 02-Jun-2019 Date of Death: 29-Jun-2019 Time of Death: 12:30AM  History of present illness:  Lance Mason is a 63 y.o. male with a history of diabetes, hypertension, hyperlipidemia, admitted to the hospital on Jun 02, 2023 with complaints of shortness of breath.  He had substantial hypoxia with a oxygen saturation of 84% on room air and was applied a nonrebreather.  He tested positive for COVID.  He was admitted to [facility] campus.  He continued to have fevers and significant hypoxia.  He was eventually transitioned to ICU for closer monitoring.  Patient initially required intubation on 5/29 for hypoxia and encephalopathy.  He was treated for COVID-19 with remdesivir, steroids, Actemra and convalescent plasma.  He was eventually extubated on 6/9.  The following day, patient vomited and became hypoxic.  This resulted in reintubation.  On 6/12, he suffered a cardiac arrest, received CPR and was resuscitated.  Unfortunately, he developed significant cyanosis and ischemia to his toes bilaterally.  There was some concern that he may have developed anoxic encephalopathy.  On 6/14, developed rapid atrial fibrillation and worsening ventilator settings.  At that point, after discussion with the family he was made a DNR.  Palliative medicine was consulted to help further address goals of care.  Family had a very difficult time coming to terms with his prognosis.  Patient had worsening vent settings, worsening hypoxia and requiring increased PEEP.  After further conversations with the family, it was discussed that it would not be appropriate to escalate his care any further.  Family was in agreement.  On 06/29/23, he was found without pulse or respirations.  Family was notified and was provided support.   Final Diagnoses:  Principal Problem:   Acute respiratory failure with hypoxemia  (HCC) Active Problems:   Essential hypertension   Type 2 diabetes mellitus without complication, without long-term current use of insulin (HCC)   Pure hypercholesterolemia   Pneumonia due to COVID-19 virus   CAP (community acquired pneumonia)   Abnormal liver function   Pressure injury, stage 3 (HCC)   Goals of care, counseling/discussion   Palliative care by specialist   Atrial fibrillation with RVR (HCC)   Aspiration pneumonia (HCC)   Cardiac arrest (HCC)   Acute metabolic encephalopathy   Ischemia of lower extremity     The results of significant diagnostics from this hospitalization (including imaging, microbiology, ancillary and laboratory) are listed below for reference.    Significant Diagnostic Studies: Dg Abd 1 View  Result Date: 05/31/2019 CLINICAL DATA:  63 year old male with abdominal distension. COVID-19. EXAM: ABDOMEN - 1 VIEW COMPARISON:  05/27/2019 and earlier. FINDINGS: Portable AP supine view at 0225 hours. Enteric tube terminates in the right upper quadrant, side hole likely at the gastric antrum. Paucity of bowel gas. No dilated loops are evident. No pneumoperitoneum is evident on these supine views. Probable urethral catheter in place. No osseous abnormality identified. Coarse and confluent pulmonary opacity at the visible lung bases. IMPRESSION: 1. Enteric tube side hole at the distal stomach. 2. Paucity of bowel gas, no dilated loops are evident. Electronically Signed   By: Odessa Fleming M.D.   On: 05/31/2019 02:54   Dg Abd 1 View  Result Date: 05/17/2019 CLINICAL DATA:  Encounter for feeding tube placement. EXAM: ABDOMEN - 1 VIEW COMPARISON:  None. FINDINGS: The bowel gas pattern is normal. Distal tip of nasogastric tube is seen in proximal  stomach. Side hole is in expected position of gastroesophageal junction. No radio-opaque calculi or other significant radiographic abnormality are seen. IMPRESSION: Distal tip of nasogastric tube seen in proximal stomach.  Electronically Signed   By: Lupita Raider M.D.   On: 05/17/2019 13:49   Ct Head Wo Contrast  Result Date: 05/26/2019 CLINICAL DATA:  Seizure EXAM: CT HEAD WITHOUT CONTRAST TECHNIQUE: Contiguous axial images were obtained from the base of the skull through the vertex without intravenous contrast. COMPARISON:  05/29/2016 FINDINGS: Brain: No evidence of acute infarction, hemorrhage, hydrocephalus, extra-axial collection or mass lesion/mass effect. Vascular: No hyperdense vessel or unexpected calcification. Skull: Normal. Negative for fracture or focal lesion. Sinuses/Orbits: No acute finding. Other: None. IMPRESSION: No acute intracranial pathology. Electronically Signed   By: Lauralyn Primes M.D.   On: 05/26/2019 16:25   Ct Angio Chest Pe W Or Wo Contrast  Result Date: 05/27/2019 CLINICAL DATA:  Hypoxia.  Reported COVID-19 pneumonia EXAM: CT ANGIOGRAPHY CHEST WITH CONTRAST TECHNIQUE: Multidetector CT imaging of the chest was performed using the standard protocol during bolus administration of intravenous contrast. Multiplanar CT image reconstructions and MIPs were obtained to evaluate the vascular anatomy. CONTRAST:  OMNIPAQUE IOHEXOL 350 MG/ML SOLN COMPARISON:  Chest CT May 15, 2019 and chest radiograph May 26, 2019 FINDINGS: Cardiovascular: There is no demonstrable pulmonary embolus. There is no thoracic aortic aneurysm or dissection. The visualized great vessels appear normal. There is no pericardial effusion or pericardial thickening. Central catheter tip is in the superior vena cava. Mediastinum/Nodes: Visualized thyroid appears normal. There are multiple subcentimeter mediastinal lymph nodes. There is a subcarinal lymph node measuring 1.7 x 1.4 cm. No other lymph nodes meet size criteria for pathologic significance. Nasogastric tube passes through the esophagus with the tip in the stomach. Lungs/Pleura: Endotracheal tube tip is in the distal trachea. No pneumothorax. There is widespread alveolar  opacity throughout the lungs diffusely consistent with multifocal pneumonia. There is less consolidation in the upper lobes compared to most recent CT examination. There is consolidation in portions of the lower lobes, more severe on the right than on the left. There is significant increase in consolidation throughout the right lower lobe compared to prior CT and more recent radiograph. There is no appreciable pleural effusion. There are calcifications along each hemidiaphragm consistent with prior asbestos exposure. Upper Abdomen: Visualized upper abdominal structures appear unremarkable. Musculoskeletal: No blastic or lytic bone lesions are evident. There are no chest wall lesions. Review of the MIP images confirms the above findings. IMPRESSION: 1. No demonstrable pulmonary embolus. No thoracic aortic aneurysm or dissection. 2. Widespread multifocal pneumonia. There is less consolidation in the upper lobes compared to most recent CT. There is, however, significant increase in consolidation throughout the right lower lobe consistent with post recent CT and radiographic examination. No pleural effusions evident. 3. Enlarged subcarinal lymph node which may well be of reactive etiology given the extensive parenchymal lung abnormality. Multiple subcentimeter lymph nodes elsewhere noted. 4. Calcification along each hemidiaphragm consistent with previous asbestos exposure. Electronically Signed   By: Bretta Bang III M.D.   On: 05/27/2019 10:56   Ct Angio Chest Pe W Or Wo Contrast  Result Date: 05/15/2019 CLINICAL DATA:  Shortness of breath and positive COVID-19 EXAM: CT ANGIOGRAPHY CHEST WITH CONTRAST TECHNIQUE: Multidetector CT imaging of the chest was performed using the standard protocol during bolus administration of intravenous contrast. Multiplanar CT image reconstructions and MIPs were obtained to evaluate the vascular anatomy. CONTRAST:  OMNIPAQUE IOHEXOL 350  MG/ML SOLN COMPARISON:  Chest x-ray  from previous day. FINDINGS: Cardiovascular: Thoracic aorta is within normal limits. No dissection or aneurysmal dilatation is seen. No cardiac enlargement is noted. No coronary calcifications are seen. Pulmonary artery is well visualized without evidence of pulmonary embolism. Mediastinum/Nodes: Esophagus is within normal limits. Thoracic inlet is unremarkable. Scattered lymph nodes are noted throughout the mediastinum. Largest of these lies in the subcarinal region measuring just under 16 mm. No definitive hilar adenopathy is noted. Lungs/Pleura: Lungs demonstrate bilateral ground-glass infiltrative changes throughout both lungs consistent with the given clinical history of COVID-19 positivity. Bilateral pleural calcifications are noted. No sizable effusion is seen. No parenchymal nodules are noted. Upper Abdomen: Fatty liver is noted. Musculoskeletal: Degenerative changes of the thoracic spine are noted. No acute bony abnormality is seen. Review of the MIP images confirms the above findings. IMPRESSION: Diffuse bilateral ground-glass infiltrates consistent with the known history of COVID-19 positivity. No evidence of pulmonary emboli. Scattered mediastinal lymphadenopathy as described. Fatty liver. Electronically Signed   By: Alcide Clever M.D.   On: 05/15/2019 01:41   Dg Chest Port 1 View  Result Date: 06/08/2019 CLINICAL DATA:  Hypoxia and respiratory failure. EXAM: PORTABLE CHEST 1 VIEW COMPARISON:  06/08/2019 FINDINGS: Cardiomediastinal silhouette is obscured but appears unchanged. An endotracheal tube with tip 5 cm above the carina, NG tube entering the stomach with tip off field of view, and RIGHT PICC line with tip overlying the SUPERIOR cavoatrial junction again noted. Diffuse bilateral airspace opacities are unchanged. No evidence of pneumothorax. IMPRESSION: Unchanged appearance of the chest with diffuse bilateral airspace disease/edema. Electronically Signed   By: Harmon Pier M.D.   On: 06/08/2019  17:18   Dg Chest Port 1 View  Result Date: 06/08/2019 CLINICAL DATA:  Acute respiratory failure with hypoxia. EXAM: PORTABLE CHEST 1 VIEW COMPARISON:  05/31/2019 and CT 05/27/2019 FINDINGS: Endotracheal tube has tip 4.7 cm above the carina. Enteric tube courses into the region of the stomach and off the film as tip is not visualized. Right-sided PICC line unchanged with tip over the SVC. Lungs are adequately inflated demonstrate persistent bilateral hazy airspace opacification more prominent over the central mid to lower lungs likely multifocal infection. Possible small amount of bilateral pleural fluid unchanged. Remainder of the exam is unchanged. IMPRESSION: Stable bilateral hazy airspace process worse over the mid to lower lungs. Findings likely due to multifocal infection. Small amount of bilateral pleural fluid unchanged. Tubes and lines as described. Electronically Signed   By: Elberta Fortis M.D.   On: 06/08/2019 16:20   Dg Chest Port 1 View  Result Date: 05/31/2019 CLINICAL DATA:  Coronavirus infection.  Respiratory failure. EXAM: PORTABLE CHEST 1 VIEW COMPARISON:  Earlier same day FINDINGS: Endotracheal tube tip 3 cm above the carina. Orogastric or nasogastric tube enters the abdomen. Right arm PICC tip at the SVC RA junction. Widespread pulmonary infiltrates persist, similar to the previous study. IMPRESSION: Persistent widespread pulmonary infiltrates as seen previously. Lines and tubes well positioned. Electronically Signed   By: Paulina Fusi M.D.   On: 05/31/2019 10:23   Dg Chest Port 1 View  Result Date: 05/31/2019 CLINICAL DATA:  63 year old male with respiratory failure. COVID-19. EXAM: PORTABLE CHEST 1 VIEW COMPARISON:  05/29/2019 and earlier. FINDINGS: Portable AP supine view at 0008 hours. Endotracheal tube tip in good position between the clavicles and carina. Enteric tube courses to the abdomen, tip not included. Left subclavian central line has been removed and there is now a right  upper  extremity approach PICC line in place, tip at the cavoatrial junction level. Calcified pleural plaques along the diaphragm. Widespread bilateral basilar predominant pulmonary opacity, mildly progressed. Associated increased bibasilar air bronchograms. No superimposed pneumothorax. Visible mediastinal contours are stable. No definite pleural effusion. IMPRESSION: 1. Right upper extremity approach PICC line placed, tip at the cavoatrial junction level. Left subclavian central line removed. 2. ET tube in good position. Enteric tube courses to the abdomen. 3. Mild progression of bilateral basilar predominant pulmonary opacity compatible with COVID-19 pneumonia. Underlying calcified pleural plaques. Electronically Signed   By: Odessa Fleming M.D.   On: 05/31/2019 00:55   Portable Chest X-ray  Result Date: 05/29/2019 CLINICAL DATA:  Shortness of breath EXAM: PORTABLE CHEST 1 VIEW COMPARISON:  05/26/2019 FINDINGS: Nasogastric tube coiled in the stomach. Left subclavian central venous catheter with the tip projecting over the SVC. Endotracheal tube with the tip 3 cm above the carina. Bilateral diffuse interstitial and alveolar airspace opacities. No pleural effusion. Bilateral pleural plaques along the diaphragmatic surfaces. No pneumothorax. Stable cardiomegaly. No acute osseous abnormality. IMPRESSION: 1. Support lines and tubing in satisfactory position. 2. Worsening bilateral alveolar and interstitial airspace disease concerning for worsening pulmonary edema versus multilobar pneumonia. Electronically Signed   By: Elige Ko   On: 05/29/2019 09:32   Dg Chest Port 1 View  Result Date: 05/26/2019 CLINICAL DATA:  Hypoxia.  Reported COVID-19 positive EXAM: PORTABLE CHEST 1 VIEW COMPARISON:  May 19, 2019 chest radiograph and chest CT May 15, 2019 FINDINGS: Endotracheal tube tip is 4.5 cm above the carina. Central catheter tip is in the superior vena cava. No pneumothorax. There is patchy airspace opacity in each mid  and lower lung zone. Heart is mildly enlarged with pulmonary vascularity normal. No adenopathy. There are foci of diaphragmatic calcification as well as scattered pleural plaques. IMPRESSION: Tube and catheter positions as described without pneumothorax. Evidence of previous asbestos exposure with pleural plaques and diaphragmatic calcification. Patchy airspace opacity bilaterally is similar to most recent study and is consistent with atypical organism pneumonia. Appearance similar to most recent study. Electronically Signed   By: Bretta Bang III M.D.   On: 05/26/2019 13:41   Dg Chest Port 1 View  Result Date: 05/19/2019 CLINICAL DATA:  Acute respiratory failure.  Ventilator support. EXAM: PORTABLE CHEST 1 VIEW COMPARISON:  05/18/2019 FINDINGS: Endotracheal tube tip is 4 cm above the carina. Orogastric or nasogastric tube enters the stomach. Left subclavian central line tip in the SVC above the right atrium. Bilateral patchy pulmonary infiltrates appear similar. No worsening or new finding. Extensive calcified pleural plaques as noted previously. IMPRESSION: Lines and tubes well positioned. Persistent bilateral patchy pulmonary infiltrates without new finding. Electronically Signed   By: Paulina Fusi M.D.   On: 05/19/2019 13:07   Dg Chest Port 1 View  Result Date: 05/18/2019 CLINICAL DATA:  COVID-19 viral infection, ETT EXAM: PORTABLE CHEST 1 VIEW COMPARISON:  05/17/2019 FINDINGS: Endotracheal tube terminates 4 cm above the carina. Multifocal patchy/airspace opacities bilaterally. No definite pleural effusions. Cardiomegaly. Left subclavian venous catheter terminates cavoatrial junction. Enteric tube courses into the stomach. IMPRESSION: Multifocal patchy/airspace opacities bilaterally, compatible with known pneumonia, grossly unchanged. Endotracheal tube terminates 4 cm above the carina. Additional support apparatus as above. Electronically Signed   By: Charline Bills M.D.   On: 05/18/2019 06:55    Dg Chest Port 1 View  Result Date: 05/17/2019 CLINICAL DATA:  Encounter for intubation, feeding tube placement and central line placement. EXAM: PORTABLE CHEST 1 VIEW  COMPARISON:  Radiograph of same day. FINDINGS: The heart size and mediastinal contours are within normal limits. Endotracheal tube is in grossly good position. Distal tip of nasogastric tube is seen in proximal stomach. Left subclavian catheter is noted with tip in expected position of the SVC. Stable bilateral lung opacities are noted consistent with multifocal pneumonia or edema. Calcified pleural plaques are noted bilaterally suggesting asbestos exposure. The visualized skeletal structures are unremarkable. IMPRESSION: Endotracheal tube is in grossly good position. Distal tip of nasogastric tube is seen in proximal stomach. Left subclavian catheter is in good position. Stable bilateral lung opacities are noted concerning for multifocal pneumonia or possibly edema. Electronically Signed   By: Marijo Conception M.D.   On: 05/17/2019 13:48   Dg Chest Port 1 View  Result Date: 05/17/2019 CLINICAL DATA:  Respiratory failure. EXAM: PORTABLE CHEST 1 VIEW COMPARISON:  CT 05/15/2019.  Chest x-ray 06-08-2019. FINDINGS: Stable cardiomegaly. Diffuse severe bilateral pulmonary infiltrates/edema again noted. Similar findings noted on prior CT. No pleural effusion or pneumothorax. No acute bony abnormality. IMPRESSION: 1. Diffuse severe bilateral pulmonary infiltrates/edema again noted. Similar findings noted on prior CT. No interim improvement. 2.  Stable cardiomegaly. Electronically Signed   By: Marcello Moores  Register   On: 05/17/2019 07:44   Dg Chest Port 1 View  Result Date: 06-08-2019 CLINICAL DATA:  63 year old male with history of worsening shortness of breath. Diagnosed with COVID-19 10 days ago. EXAM: PORTABLE CHEST 1 VIEW COMPARISON:  Chest x-ray 05/08/2019. FINDINGS: Low lung volumes. Calcified pleural plaques throughout the thorax bilaterally  again noted. Mild diffuse interstitial prominence and patchy ill-defined airspace disease throughout the mid to lower lungs bilaterally (left greater than right). No pleural effusions. No evidence of pulmonary edema. Heart size is borderline enlarged, accentuated by low lung volumes and portable AP technique. Upper mediastinal contours are within normal limits. IMPRESSION: 1. Findings are compatible with multilobar pneumonia, as above. 2. Asbestos related pleural disease redemonstrated. Electronically Signed   By: Vinnie Langton M.D.   On: Jun 08, 2019 18:49   Dg Abd Portable 1v  Result Date: 05/27/2019 CLINICAL DATA:  Check gastric catheter placement EXAM: PORTABLE ABDOMEN - 1 VIEW COMPARISON:  05/17/2019 FINDINGS: Gastric catheter is noted within the stomach although the proximal side port appears to lie in the distal esophagus. This could be advanced several cm. No obstructive changes are seen. No abnormal mass or abnormal calcifications are noted. No bony abnormality is seen. IMPRESSION: Gastric catheter tip within the stomach although could be advanced several cm to allow the proximal side port to lie within the stomach. Electronically Signed   By: Inez Catalina M.D.   On: 05/27/2019 15:05   Vas [place] Lower Extremity Venous (dvt)  Result Date: 05/21/2019  Lower Venous Study Indications: Swelling, and elevated d-dimer, fever, COVID 19.  Performing Technologist: Darlina Sicilian RDCS  Examination Guidelines: A complete evaluation includes B-mode imaging, spectral Doppler, color Doppler, and power Doppler as needed of all accessible portions of each vessel. Bilateral testing is considered an integral part of a complete examination. Limited examinations for reoccurring indications may be performed as noted.  +---------+---------------+---------+-----------+----------+--------------+  RIGHT     Compressibility Phasicity Spontaneity Properties Summary          +---------+---------------+---------+-----------+----------+--------------+  CFV       Full            Yes       Yes                                    +---------+---------------+---------+-----------+----------+--------------+  SFJ       Full                                                             +---------+---------------+---------+-----------+----------+--------------+  FV Prox   Full                                                             +---------+---------------+---------+-----------+----------+--------------+  FV Mid    Full                                                             +---------+---------------+---------+-----------+----------+--------------+  FV Distal Full                                                             +---------+---------------+---------+-----------+----------+--------------+  POP       Full            Yes       Yes                                    +---------+---------------+---------+-----------+----------+--------------+  PTV                                                        Not visualized  +---------+---------------+---------+-----------+----------+--------------+   +---------+---------------+---------+-----------+----------+--------------+  LEFT      Compressibility Phasicity Spontaneity Properties Summary         +---------+---------------+---------+-----------+----------+--------------+  CFV       Full            Yes       Yes                                    +---------+---------------+---------+-----------+----------+--------------+  SFJ       Full                                                             +---------+---------------+---------+-----------+----------+--------------+  FV Prox   Full                                                             +---------+---------------+---------+-----------+----------+--------------+  FV Mid    Full                                                              +---------+---------------+---------+-----------+----------+--------------+  FV Distal Full                                                             +---------+---------------+---------+-----------+----------+--------------+  POP       Full            Yes       Yes                                    +---------+---------------+---------+-----------+----------+--------------+  PTV                                                        Not visualized  +---------+---------------+---------+-----------+----------+--------------+     Summary: Right: No evidence of deep vein thrombosis in the lower extremity. No indirect evidence of obstruction proximal to the inguinal ligament. No cystic structure found in the popliteal fossa. Left: No evidence of deep vein thrombosis in the lower extremity. No indirect evidence of obstruction proximal to the inguinal ligament. No cystic structure found in the popliteal fossa.  *See table(s) above for measurements and observations. Electronically signed by Waverly Ferrarihristopher Dickson MD on 05/21/2019 at 12:58:04 PM.    Final    [place] Ekg Site Rite  Result Date: 05/29/2019 If Site Rite image not attached, placement could not be confirmed due to current cardiac rhythm.   Microbiology: Recent Results (from the past 240 hour(s))  Culture, blood (Routine X 2) w Reflex to ID Panel     Status: None   Collection Time: 06/02/19  5:45 AM   Specimen: BLOOD  Result Value Ref Range Status   Specimen Description   Final    BLOOD LEFT ARM Performed at [facility], [address] [place], [place] [address]    Special Requests   Final    BOTTLES DRAWN AEROBIC ONLY Blood Culture adequate volume Performed at [facility], [address] [place], [place] [address]    Culture   Final    NO GROWTH 5 DAYS Performed at [facility], [address] [place], [place] [address]    Report Status 06/07/2019 FINAL  Final  Culture, blood (Routine X 2) w  Reflex to ID Panel     Status: None   Collection Time: 06/02/19  5:50 AM   Specimen: BLOOD  Result Value Ref Range Status   Specimen Description   Final    BLOOD LEFT HAND Performed at [facility], [address] [place], [place] [address]    Special Requests   Final    BOTTLES DRAWN AEROBIC ONLY Blood Culture adequate volume Performed at [facility], [address] [place], [place] [address]    Culture   Final    NO GROWTH 5 DAYS Performed at [facility], [address] [place], [place] [address]    Report Status 06/07/2019 FINAL  Final  Culture, respiratory (non-expectorated)     Status: None   Collection Time: 06/02/19 10:07 AM   Specimen: Tracheal Aspirate; Respiratory  Result Value Ref Range Status   Specimen Description   Final    TRACHEAL ASPIRATE Performed at [facility], [address] [place], [place] [address]    Special Requests   Final    NONE Performed at [facility], [address] [place], [place] [address]    Gram Stain   Final    RARE WBC PRESENT, PREDOMINANTLY PMN NO ORGANISMS SEEN    Culture   Final    NO GROWTH 2 DAYS Performed at [facility], [address] [place], [place] [address]    Report Status 06/04/2019 FINAL  Final     Labs: Basic Metabolic Panel: Recent Labs  Lab 06/05/19 0515  06/05/19 2020 06/06/19 0400  06/07/19 0450  06/07/19 2348 06/08/19 0110 06/08/19 1658 06/09/19 0132 06/09/19 0302  NA  --    < > 148* 148*   < > 144   < > 143 143 140 144 143  K  --    < > 4.6 3.8   < > 3.6   < > 3.3* 3.4* 3.6 3.3* 3.6  CL  --   --  97* 95*  --  91*  --   --  89*  --  87*  --   CO2  --   --  41* 43*  --  44*  --   --  44*  --  48*  --   GLUCOSE  --   --  245* 155*  --  156*  --   --  239*  --  176*  --   BUN  --   --  49* 44*  --  46*  --   --  42*  --  39*  --   CREATININE  --   --  0.87 0.77  --  0.75  --   --  0.72  --  0.78  --    CALCIUM  --   --  8.1* 8.1*  --  8.0*  --   --  8.1*  --  8.1*  --   MG 1.9  --   --   --   --   --   --   --   --   --   --   --    < > = values in this interval not displayed.   Liver Function Tests: Recent Labs  Lab 06/04/19 0130 06/05/19 0215 06/07/19 0450 06/08/19 0110 06/09/19 0132  AST 74* 57* 61* 53* 47*  ALT 92* 61* 64* 54* 43  ALKPHOS 530* 438* 482* 392* 318*  BILITOT 0.2* 0.4 0.2* 0.2* 0.2*  PROT 6.6 5.9* 6.4* 7.2 6.9  ALBUMIN 2.2* 1.8* 1.8* 2.0* 1.8*   No results for input(s): LIPASE, AMYLASE in the last 168 hours. No results for input(s): AMMONIA in the last 168 hours. CBC: Recent Labs  Lab 06/05/19 0215  06/06/19 0400  06/07/19 0450  06/07/19 2348 06/08/19 0110 06/08/19 1658 06/09/19 0132 06/09/19 0302  WBC 8.0  --  7.2  --  7.8  --   --  8.0  --  5.9  --   HGB 10.3*   < > 10.2*   < > 9.5*   < > 9.9* 9.9* 10.2* 9.2* 10.2*  HCT 33.5*   < > 35.0*   < > 32.6*   < > 29.0* 33.2* 30.0* 31.4* 30.0*  MCV 104.7*  --  108.4*  --  108.7*  --   --  107.8*  --  108.3*  --   PLT 128*  --  147*  --  146*  --   --  173  --  184  --    < > = values in this interval not displayed.   Cardiac Enzymes: No results for input(s): CKTOTAL, CKMB, CKMBINDEX, TROPONINI in the last 168 hours. D-Dimer No results for input(s): DDIMER in the last 72 hours. BNP: Invalid input(s): POCBNP CBG: Recent Labs  Lab 06/09/19 0319 06/09/19 0726 06/09/19 1141 06/09/19 1627 06/09/19 2024  GLUCAP 151* 182* 187* 144* 167*   Anemia work up No results for input(s): VITAMINB12, FOLATE, FERRITIN, TIBC, IRON, RETICCTPCT in the last 72 hours. Urinalysis    Component Value Date/Time   COLORURINE YELLOW 05/28/2016 2056   APPEARANCEUR CLEAR 05/28/2016 2056   LABSPEC 1.041 (H) 05/28/2016 2056   PHURINE 5.0 05/28/2016 2056   GLUCOSEU >1000 (A) 05/28/2016 2056   HGBUR NEGATIVE 05/28/2016 2056   BILIRUBINUR negative 06/19/2018 1601   KETONESUR negative 06/19/2018 1601   KETONESUR 15 (A)  05/28/2016 2056   PROTEINUR negative 06/19/2018 1601   PROTEINUR NEGATIVE 05/28/2016 2056   UROBILINOGEN 0.2 06/19/2018 1601   NITRITE Negative 06/19/2018 1601   NITRITE NEGATIVE 05/28/2016 2056   LEUKOCYTESUR Negative 06/19/2018 1601   Sepsis Labs Invalid input(s): PROCALCITONIN,  WBC,  LACTICIDVEN     SIGNED:  Erick Blinks, MD  [facility] 06/01/2019, 6:46 PM Pager   If 7PM-7AM, please contact night-coverage [email or web address] Password [email or web address]

## 2019-06-19 DEATH — deceased

## 2019-08-08 MED FILL — Medication: Qty: 1 | Status: AC

## 2020-03-07 IMAGING — DX PORTABLE CHEST - 1 VIEW
1 series · 1 of 1 positions shown · non-contrast
Comparison: 05/26/2019

CLINICAL DATA: Shortness of breath

EXAM:
PORTABLE CHEST 1 VIEW

[chest ap]
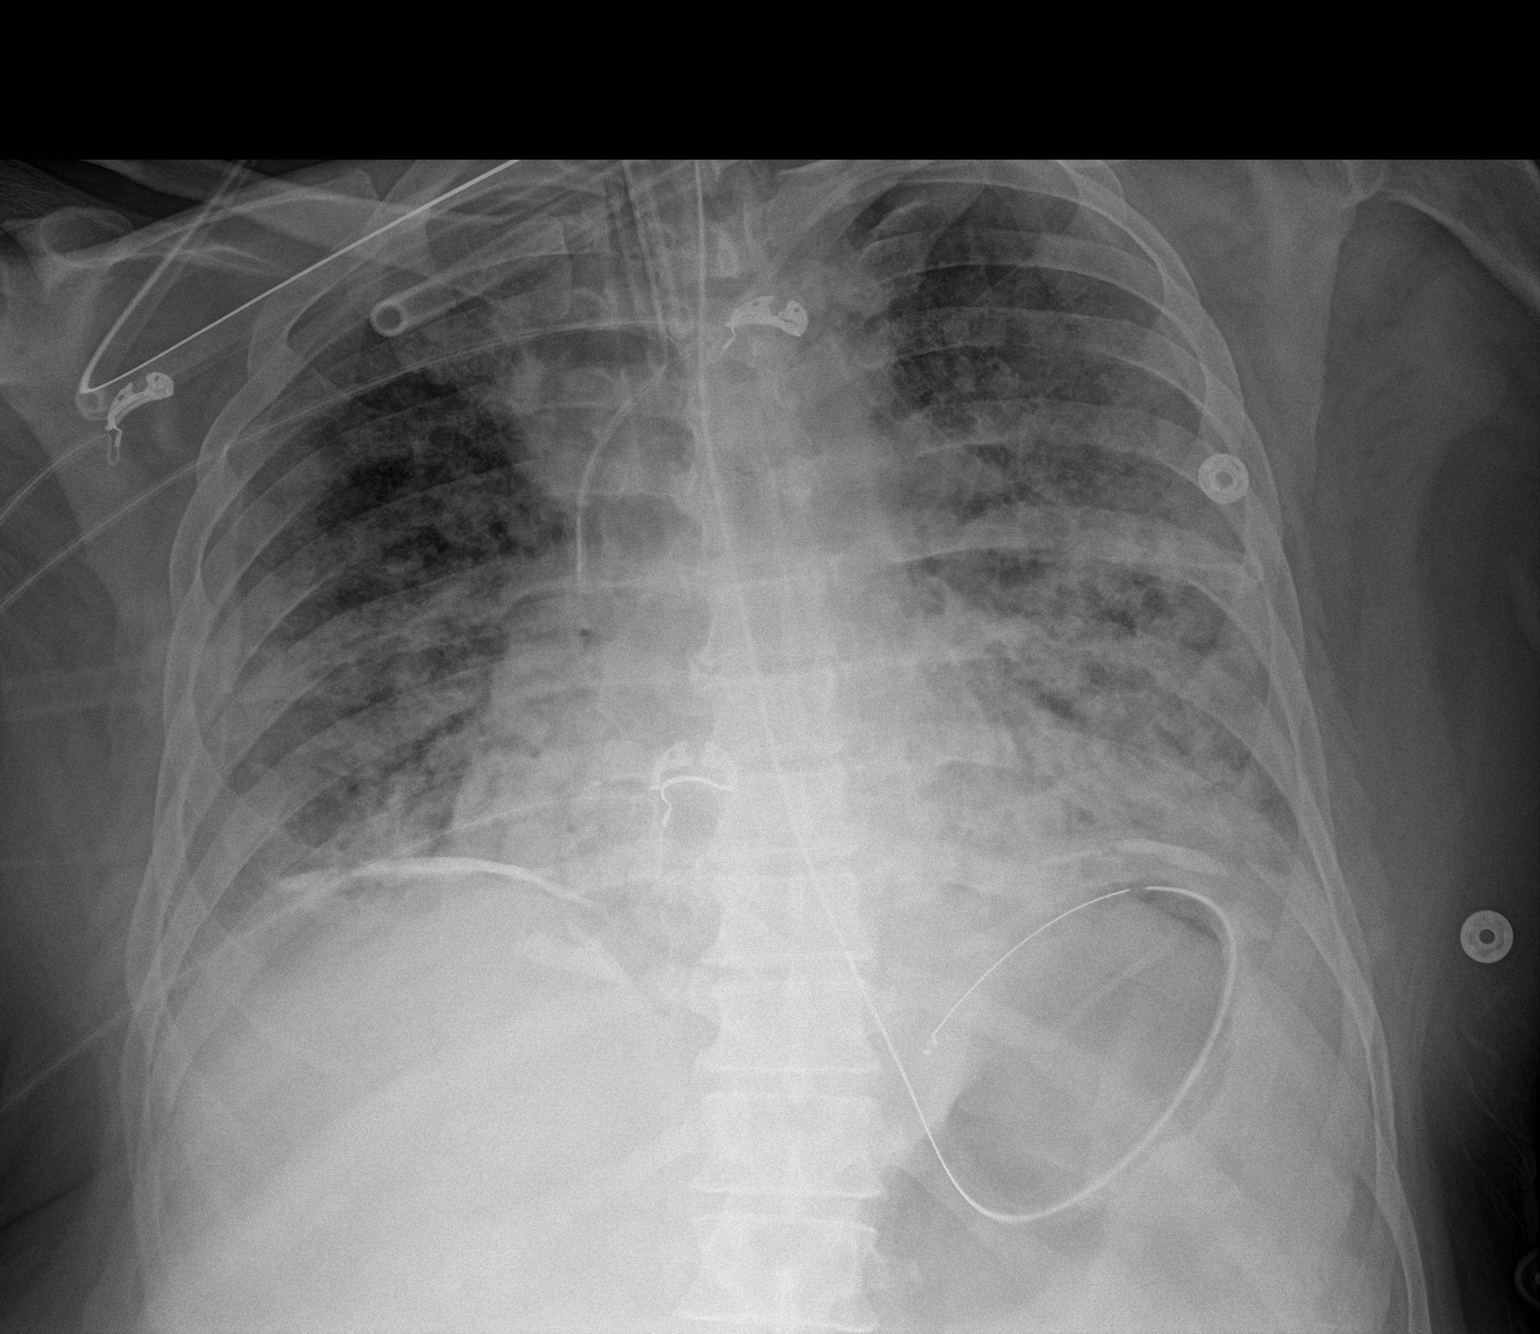

[1 of 1 positions shown; findings below may reference images not displayed]

FINDINGS: Nasogastric tube coiled in the stomach. Left subclavian central
venous catheter with the tip projecting over the SVC. Endotracheal
tube with the tip 3 cm above the carina. Bilateral diffuse
interstitial and alveolar airspace opacities. No pleural effusion.
Bilateral pleural plaques along the diaphragmatic surfaces. No
pneumothorax. Stable cardiomegaly. No acute osseous abnormality.
IMPRESSION: 1. Support lines and tubing in satisfactory position.
2. Worsening bilateral alveolar and interstitial airspace disease
concerning for worsening pulmonary edema versus multilobar
pneumonia.

## 2020-03-09 IMAGING — DX ABDOMEN - 1 VIEW
2 series · 2 of 2 positions shown · non-contrast
Comparison: 05/27/2019 and earlier.

CLINICAL DATA: 63-year-old male with abdominal distension.
[id number].

EXAM:
ABDOMEN - 1 VIEW

[abdomen kub (1 of 2)]
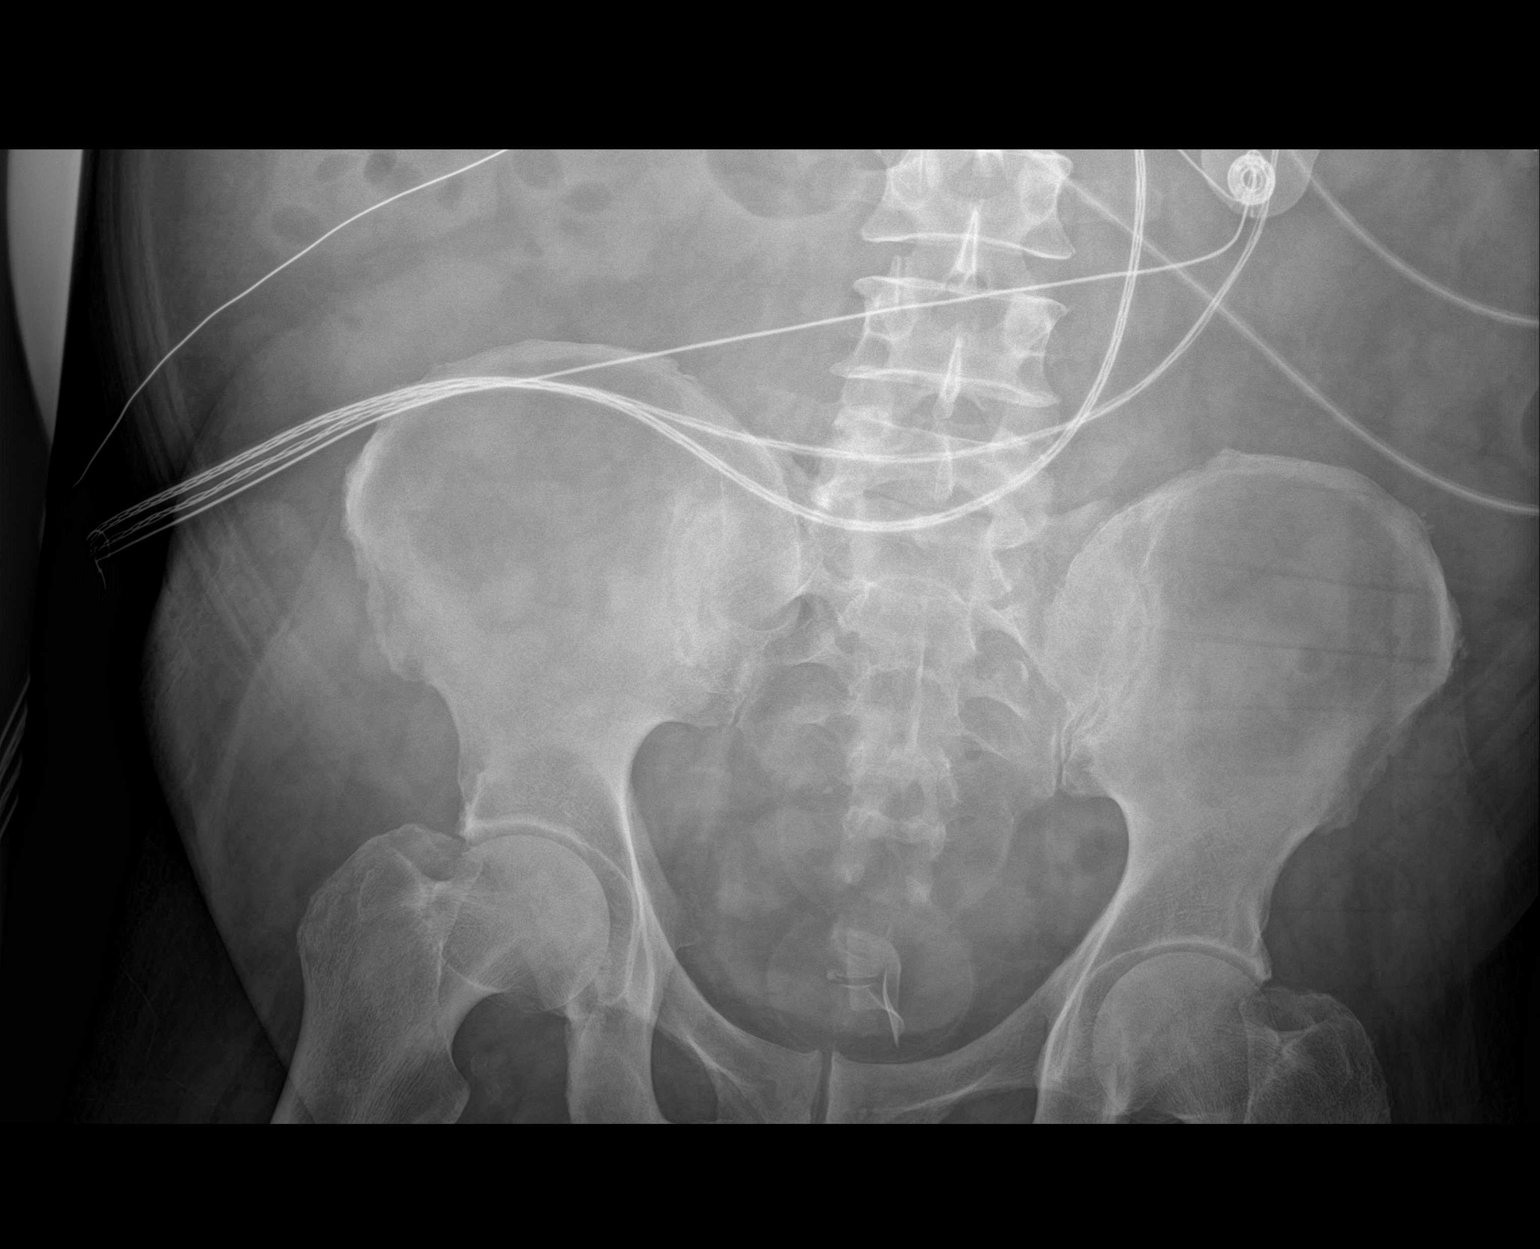

[abdomen kub (2 of 2)]
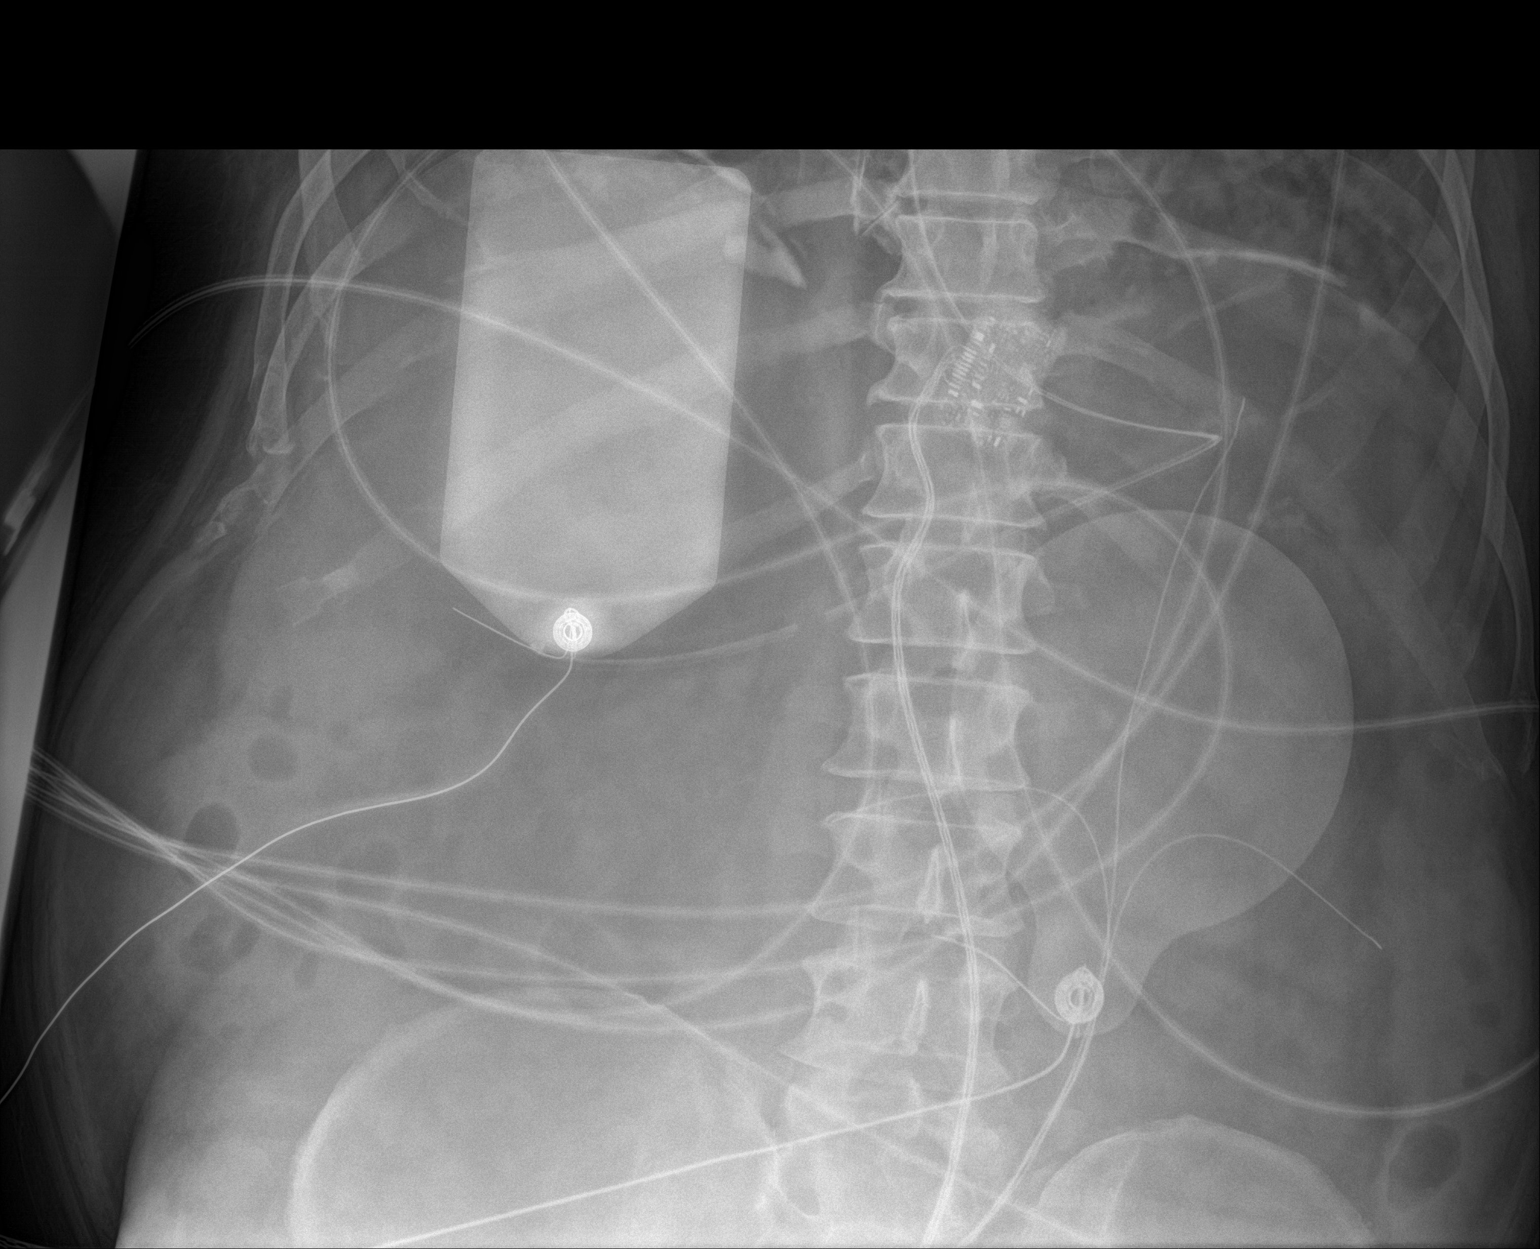

[2 of 2 positions shown; findings below may reference images not displayed]

FINDINGS: Portable AP supine view at 7446 hours. Enteric tube terminates in
the right upper quadrant, side hole likely at the gastric antrum.
Paucity of bowel gas. No dilated loops are evident. No
pneumoperitoneum is evident on these supine views. Probable urethral
catheter in place. No osseous abnormality identified. Coarse and
confluent pulmonary opacity at the visible lung bases.
IMPRESSION: 1. Enteric tube side hole at the distal stomach.
2. Paucity of bowel gas, no dilated loops are evident.
